# Patient Record
Sex: Male | Born: 1935 | Race: White | Hispanic: No | Smoking: Former smoker
Health system: Southern US, Community
[De-identification: ages and names within clinical notes are randomized; demographics above are authoritative.]

## PROBLEM LIST (undated history)

## (undated) DIAGNOSIS — I2510 Atherosclerotic heart disease of native coronary artery without angina pectoris: Principal | ICD-10-CM

## (undated) DIAGNOSIS — I739 Peripheral vascular disease, unspecified: Secondary | ICD-10-CM

## (undated) DIAGNOSIS — I10 Essential (primary) hypertension: Secondary | ICD-10-CM

## (undated) DIAGNOSIS — I779 Disorder of arteries and arterioles, unspecified: Secondary | ICD-10-CM

## (undated) DIAGNOSIS — R002 Palpitations: Secondary | ICD-10-CM

## (undated) DIAGNOSIS — H3530 Unspecified macular degeneration: Secondary | ICD-10-CM

## (undated) DIAGNOSIS — D649 Anemia, unspecified: Secondary | ICD-10-CM

## (undated) DIAGNOSIS — E7800 Pure hypercholesterolemia, unspecified: Secondary | ICD-10-CM

## (undated) DIAGNOSIS — N183 Chronic kidney disease, stage 3 unspecified: Secondary | ICD-10-CM

## (undated) HISTORY — DX: Peripheral vascular disease, unspecified: I73.9

## (undated) HISTORY — DX: Unspecified macular degeneration: H35.30

## (undated) HISTORY — DX: Palpitations: R00.2

## (undated) HISTORY — PX: KNEE SURGERY: SHX244

## (undated) HISTORY — DX: Disorder of arteries and arterioles, unspecified: I77.9

## (undated) HISTORY — DX: Chronic kidney disease, stage 3 unspecified: N18.30

## (undated) HISTORY — DX: Chronic kidney disease, stage 3 (moderate): N18.3

## (undated) HISTORY — PX: HERNIA REPAIR: SHX51

## (undated) HISTORY — DX: Atherosclerotic heart disease of native coronary artery without angina pectoris: I25.10

---

## 2000-10-29 DIAGNOSIS — I2510 Atherosclerotic heart disease of native coronary artery without angina pectoris: Secondary | ICD-10-CM

## 2000-10-29 HISTORY — DX: Atherosclerotic heart disease of native coronary artery without angina pectoris: I25.10

## 2001-05-02 ENCOUNTER — Ambulatory Visit (HOSPITAL_COMMUNITY): Admission: RE | Admit: 2001-05-02 | Discharge: 2001-05-02 | Payer: Self-pay | Admitting: Interventional Cardiology

## 2001-05-02 ENCOUNTER — Encounter: Payer: Self-pay | Admitting: Interventional Cardiology

## 2001-10-17 HISTORY — PX: CARDIAC CATHETERIZATION: SHX172

## 2003-03-19 ENCOUNTER — Ambulatory Visit (HOSPITAL_COMMUNITY): Admission: RE | Admit: 2003-03-19 | Discharge: 2003-03-19 | Payer: Self-pay | Admitting: *Deleted

## 2003-03-19 ENCOUNTER — Encounter: Payer: Self-pay | Admitting: *Deleted

## 2004-03-30 ENCOUNTER — Encounter: Admission: RE | Admit: 2004-03-30 | Discharge: 2004-03-30 | Payer: Self-pay | Admitting: Cardiovascular Disease

## 2004-04-03 ENCOUNTER — Ambulatory Visit (HOSPITAL_COMMUNITY): Admission: RE | Admit: 2004-04-03 | Discharge: 2004-04-03 | Payer: Self-pay | Admitting: Cardiovascular Disease

## 2004-04-03 HISTORY — PX: CEREBRAL ANGIOGRAM: SHX1326

## 2004-04-28 DIAGNOSIS — I739 Peripheral vascular disease, unspecified: Secondary | ICD-10-CM

## 2004-04-28 HISTORY — DX: Peripheral vascular disease, unspecified: I73.9

## 2004-04-28 HISTORY — PX: CAROTID ENDARTERECTOMY: SUR193

## 2004-05-02 ENCOUNTER — Inpatient Hospital Stay (HOSPITAL_COMMUNITY): Admission: RE | Admit: 2004-05-02 | Discharge: 2004-05-03 | Payer: Self-pay | Admitting: *Deleted

## 2004-05-02 ENCOUNTER — Encounter (INDEPENDENT_AMBULATORY_CARE_PROVIDER_SITE_OTHER): Payer: Self-pay | Admitting: Specialist

## 2006-11-27 ENCOUNTER — Encounter: Admission: RE | Admit: 2006-11-27 | Discharge: 2006-11-27 | Payer: Self-pay | Admitting: Family Medicine

## 2007-02-25 ENCOUNTER — Inpatient Hospital Stay (HOSPITAL_COMMUNITY): Admission: EM | Admit: 2007-02-25 | Discharge: 2007-02-28 | Payer: Self-pay | Admitting: Emergency Medicine

## 2007-09-01 ENCOUNTER — Ambulatory Visit (HOSPITAL_COMMUNITY): Admission: RE | Admit: 2007-09-01 | Discharge: 2007-09-02 | Payer: Self-pay | Admitting: Cardiology

## 2007-09-01 HISTORY — PX: OTHER SURGICAL HISTORY: SHX169

## 2008-03-31 HISTORY — PX: OTHER SURGICAL HISTORY: SHX169

## 2009-02-09 ENCOUNTER — Ambulatory Visit (HOSPITAL_BASED_OUTPATIENT_CLINIC_OR_DEPARTMENT_OTHER): Admission: RE | Admit: 2009-02-09 | Discharge: 2009-02-09 | Payer: Self-pay | Admitting: Orthopedic Surgery

## 2010-06-30 HISTORY — PX: CARDIOVASCULAR STRESS TEST: SHX262

## 2011-02-07 LAB — POCT I-STAT 4, (NA,K, GLUC, HGB,HCT)
Glucose, Bld: 101 mg/dL — ABNORMAL HIGH (ref 70–99)
HCT: 36 % — ABNORMAL LOW (ref 39.0–52.0)
Hemoglobin: 12.2 g/dL — ABNORMAL LOW (ref 13.0–17.0)
Sodium: 143 mEq/L (ref 135–145)

## 2011-02-07 LAB — GLUCOSE, CAPILLARY: Glucose-Capillary: 96 mg/dL (ref 70–99)

## 2011-02-27 ENCOUNTER — Other Ambulatory Visit: Payer: Self-pay | Admitting: Dermatology

## 2011-03-13 NOTE — Cardiovascular Report (Signed)
NAMEPRAKASH, KIMBERLING                   ACCOUNT NO.:  [id number]   MEDICAL RECORD NO.:  [id number]          PATIENT TYPE:  AMB   LOCATION:  SDS                          FACILITY:  [facility]   PHYSICIAN:  Vonna Kotyk R. Jacinto Halim, MD       DATE OF BIRTH:  [birth year]   DATE OF PROCEDURE:  DATE OF DISCHARGE:                            CARDIAC CATHETERIZATION   DATE OF PROCEDURE:  September 01, 2007.   PROCEDURES PERFORMED:  1. Abdominal aortogram  2. Selective right and left renal arteriography.   INDICATIONS:  Mr. Jamonte Curfman is a 77 year old  gentleman with COPD,  peripheral arterial disease with carotid stenosis.  He has been having  difficult to control hypertension in spite of multiple medications.  He  had undergone a renal duplex evaluation and this had revealed a 50-60%  left renal artery stenosis.  He is brought to the catheterization lab to  evaluate for renal artery stenosis.   ABDOMINAL AORTOGRAM:  Abdominal aortogram revealed presence of two renal  arteries on either side.  The left renal artery appeared to have a 40-  50% stenosis.   Selective right and left renal arteriography revealed widely patent  renal arteries without any significant stenosis.  There was no pressure  damping with 6-French catheter engagement.   IMPRESSION:  1. Widely patent renal arteries.  I suspect the hypertension is      probably essential hypertension.  Continued medical therapy is      indicated.   A total of 40 mL of contrast was utilized for diagnostic angiography.   TECHNIQUE:  The procedure under usual sterile precautions using a 6-  French right femoral arterial access.  A 6-French short LIMA catheter  was advanced in the abdominal artery and abdominal aortogram was  performed.  Then selective right and left renal arteriography was  performed.  Then the  catheters were removed in usual fashion.  The  patient tolerated the procedures well without any complications.      Cristy Hilts. Jacinto Halim, MD  Electronically Signed     JRG/MEDQ  D:  09/01/2007  T:  09/01/2007  Job:  [id number]   cc:   Darlin Priestly, MD  Donia Guiles, M.D.

## 2011-03-13 NOTE — Op Note (Signed)
[name], [name] NO.:  [id number]   MEDICAL RECORD NO.:  [id number]          PATIENT TYPE:  AMB   LOCATION:  NESC                         FACILITY:  [facility]   PHYSICIAN:  Ollen Gross, M.D.    DATE OF BIRTH:  [birth year]   DATE OF PROCEDURE:  02/09/2009  DATE OF DISCHARGE:                               OPERATIVE REPORT   PREOPERATIVE DIAGNOSIS:  Right knee medial meniscal tear.   POSTOPERATIVE DIAGNOSES:  1. Right knee medial meniscal tear.  2. Chondral defect medial femoral condyle.   PROCEDURE:  Right knee arthroscopy with medial meniscal debridement and  chondroplasty.   SURGEON:  Dr. Lequita Halt.   ASSISTANT:  None.   ANESTHESIA:  General.   ESTIMATED BLOOD LOSS:  Minimal.   DRAIN:  None.   COMPLICATIONS:  None.   CONDITION:  Stable to recovery.   BRIEF CLINICAL NOTE:  [name] is a 77 year old male with significant  right knee pain and mechanical symptoms getting progressively worse over  time.  Exam and history suggested a medial meniscal tear confirmed by  MRI.  He presents now for arthroscopy and debridement.   PROCEDURE IN DETAIL:  After the successful administration of general  anesthetic, a tourniquet was placed high on the right thigh and the  right lower extremity prepped and draped in the usual sterile fashion.  Standard superomedial and inferolateral incisions were made.  Inflow  cannula passed superomedial and the camera passed inferolateral.  Arthroscopic visualization proceeds.  The undersurface of the patella  and trochlea looked fairly normal with some low grade II chondromalacia.  The medial and lateral gutters were visualized and there was no loose  body.  Flexion and valgus force was applied to the knee and the medial  compartment was entered.  He had a bad tear in the posterior horn of the  medial meniscus which was unstable.  He also had about a 1 x 1 cm  chondral defect on the medial femoral condyle surrounded by grade  II-III  chondromalacia of about 2 x 2 cm.  A spinal needle was used to localize  the inferomedial portal.  A small incision made and dilator placed.  The  meniscus was debrided back to a stable base with baskets and a 4.2 mm  shaver and sealed off with the ArthroCare device.  It was then found to  be stable.  I debrided the unstable cartilage on the medial femoral  condyle.  That small defect about 1 x 1 cm did go down to bone.  I  debrided it down to bone and abraded it.  On the remainder of the  defect, we just trimmed away the unstable cartilage leaving a stable  cartilaginous base with stable edges.  The intercondylar notch was  visualized.  The ACL was normal.  The lateral compartment was entered  and it looked normal.  The joints were again inspected and there were no  other tears, loose bodies or defects noted.  The arthroscopic equipment  was removed from the inferior portals which were closed with interrupted  4-0  nylon.  Twenty mL of quarter-percent Marcaine with epinephrine  injected through the inflow cannula, then that was removed and that  portal closed with nylon.  A bulky sterile dressing was applied.  He was  awakened and transported to recovery in stable condition.      Ollen Gross, M.D.  Electronically Signed     FA/MEDQ  D:  02/09/2009  T:  02/09/2009  Job:  [id number]

## 2011-03-13 NOTE — Discharge Summary (Signed)
[name], [name] NO.:  [id number]   MEDICAL RECORD NO.:  [id number]          PATIENT TYPE:  OIB   LOCATION:  4710                         FACILITY:  [facility]   PHYSICIAN:  Nanetta Batty, M.D.   DATE OF BIRTH:  06-Apr-1930   DATE OF ADMISSION:  09/01/2007  DATE OF DISCHARGE:                               DISCHARGE SUMMARY   HISTORY:  [name] is a 77 year old white male patient who came into  the hospital for renal angiogram secondary to resistant hypertension on  multiple medications.  The procedure was performed by Dr. Jacinto Halim.  He had  no renal artery stenosis.  He was going to be sent home that same day;  however, his blood pressure was elevated.  He was given some IV  labetalol, and he was admitted for 23-hour observation.   On the morning of September 02, 2007 his blood pressure was 166/64; pulse  was 55; respirations were 20.  No labs were done in the hospital.   DISCHARGE MEDICINES:  His usual medicines which include:  1. Lisinopril 40 mg at bedtime.  2. Diovan 320 mg every day.  3. Clonidine 0.2 mg b.i.d.  4. Terazosin 10 mg every day.  5. Simvastatin 40 mg every day.  6. Plendil 10 mg every day.  7. Fish oil 2 tablets daily.  8. Bystolic 5 mg every day.  9. We added chlorthalidone 25 mg every day.   He will have a BMET drawn in 1 week.  He will follow up with Dr. Jenne Duran  in 2-3 weeks.   DISCHARGE DIAGNOSIS:  1. Resistant hypertension.  2. Noncritical coronary artery disease.  3. History of arteriosclerotic cardiovascular disease with a history      of carotid enterectomy.      [name], N.P.      Nanetta Batty, M.D.  Electronically Signed    BB/MEDQ  D:  09/02/2007  T:  09/03/2007  Job:  [id number]

## 2011-03-16 NOTE — Cardiovascular Report (Signed)
[name], [name]                             ACCOUNT NO.:  [id number]   MEDICAL RECORD NO.:  [id number]                   PATIENT TYPE:  OIB   LOCATION:  2857                                 FACILITY:  [facility]   PHYSICIAN:  Nanetta Batty, M.D.                DATE OF BIRTH:  1930/08/05   DATE OF PROCEDURE:  04/03/2004  DATE OF DISCHARGE:  04/03/2004                              CARDIAC CATHETERIZATION   PROCEDURE:  Cerebral angiogram.   CARDIOLOGIST:  Nanetta Batty, M.D.   INDICATIONS:  [name] is a 77 year old patient of Dr. Jenne Campus with a know  history of left ICA stenosis.  Other problems include noncritical CAD by  catheterization in 2002, hypertension, hyperlipidemia and moderate  claudication as well as dyslipidemia.  Recent cerebral MRI showed 90% left  ICA stenosis.  The patient is neurologically asymptomatic.  He presents now  for diagnostic cerebral angiography to define his anatomy.  Of note is the  fact that he did undergo cerebral angiography by Dr. Darrold Span in May  2004 revealing 70% left ICA stenosis.   DESCRIPTION OF PROCEDURE:  The patient was brought to the 6th floor [facility] in the post absorptive state.  He was premedicated with p.o. Valium.  His right groin was prepped and  shaved in the usual sterile fashion.  1% Xylocaine was used for local  anesthesia.  A 5 French sheath was inserted into the right femoral artery  using standard Seldinger technique.  A 5 [place] tennis racket catheter was  used for arch angiography and distal abdominal aortography.  A JV-1 catheter  was used for selective right carotid angiography both intracranial and  extracranial, selective left carotid angiography both intracranial and  extracranial and selective left vertebral angiography both intracranial and  extracranial.  Visipaque dye was used for the entirety of the case.  Retrograde aortic pressures were monitored during the  case.  The patient  left the lab in stable condition.   ANGIOGRAPHIC RESULTS:  1. Arch aortogram:  The patient had a type I arch.  2. Right carotid artery:  There was a 30% ostial right ICA stenosis.     Intracranial and extracranial carotid circulation was normal.  The right     vertebral was not assessed because of it showing to be angiographically     absent through his last procedure.  3. Left carotid:  80% ostial left ICA stenosis with intact intracranial and     extracranial circulation.  4. Left vertebral:  Selectively engaged with normal intracranial and     extracranial circulation.  5. Distal femoral aortography:  Performed using 20 mL of Visipaque dye at 20     mL per second.  The renal arteries were widely patent.  Infrarenal     abdominal aorta was free of atherosclerotic sclerotic changes.  There was  at least 30% segmental proximal bilateral iliac stenosis.   IMPRESSION:  [name] has high grade 80% ostial left ICA stenosis and is  neurologically asymptomatic.   PLAN:  The plan will be to discharge him home today as an outpatient.  He  will be treated with aspirin and Plavix.  He is neurologically asymptomatic.  He will be seen back in the office in followup in approximately two weeks.  Dr. Higinio Plan proctored during the case.                                               Nanetta Batty, M.D.    [name]  D:  04/03/2004  T:  04/03/2004  Job:  [id number]   cc:   Peripheral Angiographic Suite  6th floor [facility]   [facility]   Darlin Priestly, M.D.  [address], [address]  [place]  [place] [address]  Fax: [phone]   Donia Guiles, M.D.  [address] [place]  [place] [address]  Fax: [phone]

## 2011-03-16 NOTE — Op Note (Signed)
[name], [name]                             ACCOUNT NO.:  [id number]   MEDICAL RECORD NO.:  [id number]                   PATIENT TYPE:  INP   LOCATION:  3312                                 FACILITY:  [facility]   PHYSICIAN:  Balinda Quails, M.D.                 DATE OF BIRTH:  1930-10-10   DATE OF PROCEDURE:  05/02/2004  DATE OF DISCHARGE:                                 OPERATIVE REPORT   No dictation for this job.                                               Balinda Quails, M.D.    PGH/MEDQ  D:  05/02/2004  T:  05/03/2004  Job:  [id number]

## 2011-03-16 NOTE — Discharge Summary (Signed)
[name], [name] NO.:  [id number]   MEDICAL RECORD NO.:  [id number]          PATIENT TYPE:  INP   LOCATION:  3731                         FACILITY:  [facility]   PHYSICIAN:  Darlin Priestly, MD  DATE OF BIRTH:  [birth year]   DATE OF ADMISSION:  02/25/2007  DATE OF DISCHARGE:  02/28/2007                               DISCHARGE SUMMARY   DISCHARGE DIAGNOSES:  1. Labile hypertension.  2. Cardiovascular disease with involvement of the carotid arteries.  3. Status post left carotid endarterectomy.  4. Newly diagnosed diabetes mellitus.  5. Status post recent episode of impaired eye vision.   HISTORY OF PRESENT ILLNESS/HOSPITAL COURSE:  This is a 77 year old  patient of Dr. Jenne Duran who presented to the emergency room with  fluctuating blood pressure.  A few days prior to that, the patient  experienced impaired vision with his left eye, which persisted for a  number of days and then spontaneously it resolved.  He was seen for this  problem by his Dr. Jana Duran who followed his macular degeneration and  it was felt to be most related to the eye condition but rather vascular  in etiology.  The patient was seen by Dr. Jenne Duran who referred him to  MRI and MRA of his brain, which showed no acute abnormality in blood  flow or in white matter.  The patient noticed his blood pressure at home  during the last few days was really labile, it was fluctuating from as  low as a systolic 120s to as high as systolic 197 and this was all  happening within a period of a couple of hours.  At times his blood  pressure was dropping into the 90s and the patient was feeling very  tired and sleepy on those occasions.   We admitted him to [facility] Telemetry Unit and had him on a  monitor.  Telemetry did not reveal any significant arrhythmia.  All of  his laboratories were within normal limits.  His hemoglobin A1c was  elevated 6 to 7 and we requested a cardiology consult with  a  nutritionist.  He was instructed regarding carbohydrate counting.  The  case was discussed with a neurologist and Dr. Thad Duran recommended the  patient followup with a neurologist as an outpatient to rule out  possible TIA.  On Feb 28, 2007, he was assessed by Dr. Alanda Duran and felt  to be stable for discharge home.   DISCHARGE MEDICATIONS:  1. Aggrenox 1 capsule b.i.d.  2. Simvastatin 40 mg daily.  3. Vasotec 20 mg b.i.d.  4. Diovan 80 mg once a day.  5. Clonidine 0.1 mg twice a day.  6. Plendil 10 mg daily.  7. Terazosin 5 mg daily.   LABORATORY DATA:  His laboratories revealed a CBC on day of discharge  was the following:  White blood cell count 5.2, hemoglobin 11.1,  hematocrit 32.5, platelet count 197.  Sodium 139, potassium 4.4,  chloride 107, CO2 27, glucose 135, BUN 31, creatinine 1.9.   DISCHARGE FOLLOWUP:  Ultrasound of the arteries scheduled on  May 19,  200, at 11:30.  Followup appointment with Dr. Jenne Duran scheduled on Mar 19, 2007, at 3:15 and another appointment scheduled with Dr. Thad Duran at  [facility] on Mar 21, 2007, at 9:00 a.m.      [name], P.A.      Darlin Priestly, MD  Electronically Signed    MK/MEDQ  D:  02/28/2007  T:  03/01/2007  Job:  [id number]   cc:   Donia Guiles, M.D.  Michael L. [name], M.D.  Darlin Priestly, MD

## 2011-03-16 NOTE — Discharge Summary (Signed)
NAMEBRECK, MARYLAND                             ACCOUNT NO.:  [id number]   MEDICAL RECORD NO.:  [id number]                   PATIENT TYPE:  INP   LOCATION:  3312                                 FACILITY:  [facility]   PHYSICIAN:  Balinda Quails, M.D.                 DATE OF BIRTH:  1930-06-24   DATE OF ADMISSION:  05/02/2004  DATE OF DISCHARGE:  05/03/2004                                 DISCHARGE SUMMARY   ADMISSION DIAGNOSIS:  Severe left internal carotid artery stenosis.   ADDITIONAL DIAGNOSES/DISCHARGE DIAGNOSES:  1. Severe left internal carotid artery stenosis, status post left carotid     endarterectomy with Dacron patch angioplasty, completed on May 02, 2004.  2. Hypertension.  3. Hyperlipidemia.  4. History of peripheral vascular disease.  5. Chronic obstructive pulmonary disease.  6. History of psoriasis.  7. History of moderate coronary artery disease.   HOSPITAL MANAGEMENT/PROCEDURES:  Left carotid endarterectomy with Dacron  patch angioplasty completed by Dr. Madilyn Fireman of [facility] on May 02, 2004.   CONSULTATIONS:  None.   HISTORY OF PRESENT ILLNESS:  Mr. Amsler is a pleasant 76 year old male who  was referred to Dr. Madilyn Fireman for evaluation of severe left internal carotid  artery stenosis.  The patient at the time of presentation denied any  neurologic symptoms.  He denied any sensory, motor, or visual deficit.  A  recent evaluation at Dr. Clayborne Dana office revealed evidence of a left  carotid bruit.  Further evaluation with Doppler studies revealed a high-  grade left internal carotid artery stenosis estimated at 80-85%.  The  patient was then seen and examined in consultation by Dr. Madilyn Fireman on April 24, 2004.  His impression was that, indeed, the patient had asymptomatic severe  left internal carotid artery stenosis that was verified by a catheter-based  arteriography.  He recommended left carotid endarterectomy for reduction of  stroke risk.  The risks, benefits, and  alternatives to the procedure were  discussed with the patient and his family at that time.  He was in  understanding and agreed to proceeding with surgery.  The patient was  instructed to discontinue his Plavix and increase his aspirin to 325 mg  daily in anticipation of surgery.   HOSPITAL COURSE:  Mr. Trulson was electively admitted to [facility]  on May 02, 2004.  He was taken to the operating room and underwent a left  carotid endarterectomy with Dacron patch angioplasty, completed by Dr. Madilyn Fireman  of [facility].  Overall, the patient tolerated this procedure well and was  extubated on the operating room table.  The patient was then transferred to  the postanesthesia care unit in stable condition.  Once awake, alert, and  appropriate, the patient was then transferred to 3300.   The patient awoke and recovered from anesthesia without any neurologic  deficits.  He remained hemodynamically stable and afebrile throughout  the  remainder of his operative stay.   On postop day #1, the patient was doing quite well.  He had already  ambulated independently in the hallways in the morning.  He had tolerated a  clear liquid diet on the night prior.  He had denied any chest pain,  shortness of breath, or nausea and vomiting.  The patient had remained  afebrile.  His blood pressure was stable in the  150s160s/40s-50s.  His  heart rate was in the 70s, reading normal sinus rhythm on telemetry.  His  respirations were 20 and unlabored.  His SPO2 was 98% on room air.  The  patient had excellent urine output with 2175 mL out overnight.   His physical exam at the time was as follows:   CARDIAC:  His heart was in a regular rate and rhythm.  CHEST:  His lungs were clear to auscultation.  ABDOMEN:  Soft, nontender, nondistended, with good bowel sounds.  EXTREMITIES:  Without edema.  He had 2+ posterior tibial pulses bilaterally.  NECK:  His left neck incision was clean, dry, and intact without  erythema,  drainage, or swelling.  NEUROLOGIC:  He was neurologically intact without any focal deficits.   The patient's lab values were all within normal limits.  The patient had  resumed normal bowel and bladder function.  Overall, he was deemed  appropriate for discharge then that date.   DISCHARGE MEDICATIONS:  1. Advair 100/50 mcg two puffs daily.  2. Aspirin 81 mg daily.  3. Clonidine 0.1 mg daily.  4. Diovan 80 mg daily.  5. Enalapril 20 mg daily.  6. Hydrochlorothiazide 25 mg daily.  7. Lescol 80 mg daily.  8. Norvasc 10 mg daily.  9. Plavix 75 mg daily.  10.      Terazosin 5 mg daily.  11.      Zetia 10 mg daily.  12.      Tylox one to two tablets every four to six hours as needed for     pain.   DISCHARGE INSTRUCTIONS:  1. Activity:  The patient should avoid driving.  He should avoid heavy     lifting or strenuous activity.  He should continue to walk daily.  The     patient should continue his breathing exercises for one more week.  2. Diet:  The patient should follow a low-cholesterol, low-fat, heart-     healthy diet.  3. Wound care:  The patient may shower starting May 04, 2004.  He should     wash his incisions daily with soap and water.  He should notify the [facility]     office if he has any drainage from his incision or any swelling or     redness.   FOLLOW-UP APPOINTMENTS:  The patient is to see Dr. Madilyn Fireman on Monday, August  1, at 9:30 a.m.      Carolyn A. Arlean Hopping, M.D.    CAF/MEDQ  D:  05/16/2004  T:  05/17/2004  Job:  [id number]   cc:   Nanetta Batty, M.D.  Fax: [phone]   Donia Guiles, M.D.  [address] [place]  [place] [address]  Fax: [phone]

## 2011-03-16 NOTE — Op Note (Signed)
[name], [name]                             ACCOUNT NO.:  [id number]   MEDICAL RECORD NO.:  [id number]                   PATIENT TYPE:  INP   LOCATION:  3312                                 FACILITY:  [facility]   PHYSICIAN:  Balinda Quails, M.D.                 DATE OF BIRTH:  1930/08/29   DATE OF PROCEDURE:  05/12/2004  DATE OF DISCHARGE:                                 OPERATIVE REPORT   SURGEON:  Balinda Quails, M.D.   ASSISTANTS:  Di Kindle. Edilia Bo, M.D., Carmin Muskrat. Eustaquio Boyden.   ANESTHESIA:  General endotracheal.   PREOPERATIVE DIAGNOSIS:  Severe left internal carotid artery stenosis.   POSTOPERATIVE DIAGNOSIS:  Severe left internal carotid artery stenosis.   PROCEDURE:  Left carotid endarterectomy with Dacron patch angioplasty.   CLINICAL NOTE:  [name] is a 77 year old male with a left carotid bruit.  He underwent Doppler evaluation followed by carotid arteriography revealing  severe left internal carotid artery stenosis.  He was referred for an  opinion regarding left carotid endarterectomy.  The patient was seen and  evaluated and recommendation was made for left carotid endarterectomy.  The  patient was consented for surgery.  The risks and benefits of the operative  procedure were explained to the patient in detail, the usual morbidity and  mortality associated with this procedure, 1-2% to include but not limited to  MI, CVA, and death.   OPERATION PROCEDURE:  Patient was brought to the operating room in stable  condition.  Placed in the supine position.  General endotracheal anesthesia  induced.  Left neck was prepped and draped in a sterile fashion.  Foley  catheter and arterial line in place.  Curvilinear skin incision was made  along the anterior border of the left sternal mastoid muscle.  Dissection  was carried down to the subcutaneous tissue with microcautery.  Deep  dissection was carried down through the platysma.  Dissection then continued  down  along the anterior border of the sternomastoid muscle to the carotid  sheath.  The facial vein was ligated with 2-0 silk and divided.  Carotid  bifurcation was exposed.  The common carotid artery was mobilized down to  the hyoid muscle and circled with a vessel loop.  Next the parathyroid and  external carotid were mobilized in a circle of vessel loops.  The internal  carotid artery was followed distally up to the posterior belly of the  gastroc muscle.  The hypoglossal nerve was reflected superiorly and  preserved.  The distal internal carotid artery was encircled with a vessel  loop.   Evaluation of carotid bifurcation revealed extensive plaque disease at the  bifurcation extending 2-3 cm into the left internal carotid artery.  The  patient was administered 7000 units of heparin intravenously.  Adequate  circulation time permitted.  The carotid vessels were controlled with  clamps.  A longitudinal  arteriotomy was made in the distal common carotid  artery.  The arteriotomy was extended across the carotid bulb and up into  the internal carotid artery.  There was ulcerated plaque at the origin of  the left internal carotid artery with a high-grade stenosis.   The shunt was inserted.  The endarterectomy elevator was then used to remove  the plaque. The endarterectomy carried down into the common carotid artery.  The plaque was divided transversely with Pott's scissors.  Plaque then  raised up into the bulb was __________ next normal carotid endarterectomy  using an eversion technique.  Distal plaque from the internal carotid artery  was raised up and terminated.  Fragments of plaque were removed with plaque  forceps.  The site was irrigated with heparin saline solution.  A Dacron  patch was then placed over the endarterectomy site using running 6-0 Prolene  suture.  At the completion of patch angioplasty the shunt was removed.  All  vessels flushed.  The clamps removed, directing initial  antegrade flow up  the external carotid artery.  Following this the internal carotid was  released.   Adequate Doppler signal and excellent pulse were present in the left  internal carotid artery.  Adequate hemostasis obtained.  Sponge and  instrument counts were correct.  The patient was given 50 mg of Protamine  intravenously.   The sternomastoid fascia was closed with a running 2-0 Vicryl suture.  The  platysma was closed with a running 2-0 Vicryl suture.  The skin was closed  with 4-0 Monocryl.  The Steri-Strips were applied.   Anesthesia was reversed in the operating room.  The patient awakened readily  and moved all extremities to command.  Transferred to the recovery room in  stable condition.                                               Balinda Quails, M.D.    PGH/MEDQ  D:  05/02/2004  T:  05/03/2004  Job:  [id number]   cc:   Nanetta Batty, M.D.  Fax: [phone]   Donia Guiles, M.D.  [address] [place]  [place] [address]  Fax: [phone]

## 2011-03-16 NOTE — H&P (Signed)
[name], BLIXT NO.:  [id number]   MEDICAL RECORD NO.:  [id number]          PATIENT TYPE:  INP   LOCATION:  1846                         FACILITY:  [facility]   PHYSICIAN:  Raymon Mutton, P.A. DATE OF BIRTH:  [birth year]   DATE OF ADMISSION:  02/25/2007  DATE OF DISCHARGE:                              HISTORY & PHYSICAL   CHIEF COMPLAINT:  Fluctuating blood pressure.   This 77 year old gentleman, a patient of Dr. Jenne Campus, presented to the  emergency room __________ .  On April 19, he woke up at 3:00 a.m. to go  to the bathroom and all of a sudden realized that he did not have a full  field of vision in the left eye.  He could see only in the periphery and  the central vision was absent.  He was really scared knowing that he has  a macular degeneration.  During the day the symptoms improved and the  patient was able to see better with the bright light.  The next morning  the same symptoms returned.  The patient went to see his ophthalmologist  in [facility] as Dr. Sharyl Nimrod, who follows him for his macular  degeneration.  He was told that everything is stable from the  ophthalmology standpoint and the problem is most likely related to  vascular etiology.  Given his previous history of carotid disease and  left carotid endarterectomy, the patient presented to our office to see  Dr. Jenne Campus.  Dr. Jenne Campus sent him immediately to the [facility] for  MRI and MRA of the head and started him on Aggrenox.   The patient had MRI and MRA on February 20, 2007.  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  MRA revealed __________  origin  of the right posterior cerebral artery which was a developmental  variant.  Otherwise negative screening intracranial MRA examination.   His symptoms persisted through April 25  when all of a sudden the patient  woke up in the morning with severe pain in his left eye like something  was torn inside and finally was able to see clearly.  During the office  visit Dr. Jenne Campus advised patient to follow up his blood pressure and  today he called to [place], saying that his blood pressure fluctuated at the  extremes.  In the morning it was as low as 98/44, then a couple of hours  later it was 175/72, and later even 213/68.  The patient was advised to  come to the emergency room for evaluation.   PAST MEDICAL HISTORY:  Significant for noncritical coronary artery  disease.  Last stress test in 2007 showed no ischemia.  2D echo in 2007  showed hyperdynamic LV, probably relaxation abnormality and no  significant valvular disease.   He also has a history of COPD, psoriasis, hypertension and  hyperlipidemia.   FAMILY HISTORY:  Noncontributory.   SOCIAL HISTORY:  The patient is widowed, lives alone, has  two children,  four grandchildren and two great grandchildren.  He is a nonsmoker, does  not drink alcohol.  Plays golf at least every other day.  Does not use  any illicit drugs.   REVIEW OF SYSTEMS:  Currently he denies any symptoms.  His vision is  restored.  He does not have any chest pain or shortness of breath,  dizziness or lightheadedness, but what was concerning was a couple of  days ago he felt really weak and sleepy, had a low energy, but today he  said symptoms were the same when his blood pressure was dropping.   MEDICATIONS:  Clonidine 0.2 mg b.i.d.  Diovan 80 mg p.o. once a day.  Enalapril 20 mg once daily.  Baby aspirin 81 mg once daily.  HCTZ 25 mg once daily.  Terazosin 5 mg once daily.  Simvastatin 40 mg once daily.  Plendil 40 mg once daily.  Aggrenox 1 p.o. daily.  Omega-3 fatty acid daily.   PHYSICAL EXAMINATION:  Blood pressure is fluctuating between 174/75 to  194/68 to 213/76 and then 180/64.  His heart rate is 62, respirations  20,  temperature 7.9, and SATs 100% on room air.   HEENT:  Normocephalic, atraumatic.  Extraocular movements intact.  Neck:  No JVD.  Left sided carotid bruits.  Lungs clear to auscultation and  percussion bilaterally.  Heart:  Regular rate and rhythm.  There are no  murmurs, gallops, rubs.  Abdomen soft, nontender, nondistended with  positive bowel sounds x4 and no organomegaly or masses appreciated.  Lower extremities without edema.   His EKG showed normal sinus rhythm.  No acute ST-T wave changes.  Laboratory showed hemoglobin 11.6, hematocrit 34.9, white blood cell  count 5.8, platelets 201.  Sodium 142, potassium 4.1, chloride 110, CO2  29, BUN 26, creatinine 113, glucose 155.  Cardiac markers first set was  negative.   IMPRESSION:  1. Poorly controlled hypertension.  2. PVD.  3. Hyperlipidemia.  4. Non-critical coronary artery disease.  5. Recent amaurosis fugax with normal brain MRI and MRA studies.   PLAN:  We are going to admit patient for observation and blood pressure  control.  Check 24-hour urine catecholamines and 24-hour metanephrine's.  We will also adjust his meds.  The physician will see patient and advise  on further plan and treatment.      Raymon Mutton, P.A.     MK/MEDQ  D:  02/25/2007  T:  02/25/2007  Job:  [id number]   cc:   [facility]

## 2011-03-27 ENCOUNTER — Other Ambulatory Visit: Payer: Self-pay | Admitting: Dermatology

## 2011-04-09 ENCOUNTER — Other Ambulatory Visit: Payer: Self-pay | Admitting: Dermatology

## 2011-05-03 ENCOUNTER — Other Ambulatory Visit: Payer: Self-pay | Admitting: Cardiology

## 2011-05-03 ENCOUNTER — Ambulatory Visit
Admission: RE | Admit: 2011-05-03 | Discharge: 2011-05-03 | Disposition: A | Discharge status: Home / Self Care | Payer: 59 | Source: Ambulatory Visit | Attending: Cardiology | Admitting: Cardiology

## 2011-05-03 DIAGNOSIS — Z01811 Encounter for preprocedural respiratory examination: Secondary | ICD-10-CM

## 2011-05-07 ENCOUNTER — Ambulatory Visit (HOSPITAL_COMMUNITY)
Admission: RE | Admit: 2011-05-07 | Discharge: 2011-05-07 | Disposition: A | Discharge status: Home / Self Care | Payer: 59 | Source: Ambulatory Visit | Attending: Cardiovascular Disease | Admitting: Cardiovascular Disease

## 2011-05-07 DIAGNOSIS — I2510 Atherosclerotic heart disease of native coronary artery without angina pectoris: Secondary | ICD-10-CM | POA: Insufficient documentation

## 2011-05-07 DIAGNOSIS — I6529 Occlusion and stenosis of unspecified carotid artery: Secondary | ICD-10-CM | POA: Insufficient documentation

## 2011-05-07 DIAGNOSIS — E663 Overweight: Secondary | ICD-10-CM | POA: Insufficient documentation

## 2011-05-07 DIAGNOSIS — I10 Essential (primary) hypertension: Secondary | ICD-10-CM | POA: Insufficient documentation

## 2011-05-07 DIAGNOSIS — Z87891 Personal history of nicotine dependence: Secondary | ICD-10-CM | POA: Insufficient documentation

## 2011-05-07 DIAGNOSIS — E119 Type 2 diabetes mellitus without complications: Secondary | ICD-10-CM | POA: Insufficient documentation

## 2011-05-07 HISTORY — PX: OTHER SURGICAL HISTORY: SHX169

## 2011-05-07 LAB — GLUCOSE, CAPILLARY
Glucose-Capillary: 151 mg/dL — ABNORMAL HIGH (ref 70–99)
Glucose-Capillary: 105 mg/dL — ABNORMAL HIGH (ref 70–99)
Glucose-Capillary: 196 mg/dL — ABNORMAL HIGH (ref 70–99)

## 2011-05-09 NOTE — Consult Note (Signed)
  NAMEAVINASH, MALTOS                   ACCOUNT NO.:  [id number]  MEDICAL RECORD NO.:  [id number]  LOCATION:  SDSC                         FACILITY:  [facility]  PHYSICIAN:  Willard Madrigal K. Braxton Vantrease, M.D.DATE OF BIRTH:  25-Apr-1930  DATE OF CONSULTATION: DATE OF DISCHARGE:  05/07/2011                                CONSULTATION   BRIEF HISTORY:  The patient has  progressive stenoses of the right internal carotid artery.    Examination is bilateral carotid arteriograms with intracranial interpretation.  FINDINGS:   The right common carotid arteriogram demonstrates the right internal carotid artery at the cervico-petrous junction to be widely patent.  The petrous, cavernous, and supraclinoid segments are normal.  The right middle and right anterior cerebral arteries  are seen to opacify normally into capillary and  venous phases.  Cross specification via the anterior communicating artery of  the left anterior cerebral artery A2 segment is seen. Also seen is a dominant right posterior communicating artery opacifying the right posterior cerebral and superior cerebellar  artery distributions.  The left common carotid arteriogram demonstrates the left internal carotid artery at the cervico-petrous junction to be normal.  The petrous, cavernous, and supraclinoid segments are normally opacified.  The left middle and left anterior cerebral arteries  opacify normally into the capillary and   venous phases.  Opacification of the anterior communicating artery and transiently of the right anterior cerebral artery,  A2 segment is seen.  IMPRESSION:  No angiographic  evidence of intracranial occlusions, stenosis, dissections, or aneurysms seen on this study.         ______________________________ Grandville Silos Corliss Skains, M.D.     SKD/MEDQ  D:  05/08/2011  T:  05/09/2011  Job:  [id number]  Electronically Signed by Julieanne Cotton M.D. on 05/09/2011 11:14:44 AM

## 2011-05-23 NOTE — Procedures (Signed)
  NAMEROMMIE, DUNN NO.:  [id number]  MEDICAL RECORD NO.:  [id number]  LOCATION:  SDSC                         FACILITY:  [facility]  PHYSICIAN:  Nanetta Batty, M.D.   DATE OF BIRTH:  Apr 02, 1930  DATE OF PROCEDURE: DATE OF DISCHARGE:                   PERIPHERAL VASCULAR INVASIVE PROCEDURE   Mr. Broad is a very pleasant 77 year old mildly overweight widowed Caucasian male whose wife of 52 years died back in 03-17-05.  He is a father of 2 and grandfather of 2 grandchildren.  His risk factors include remote tobacco abuse, diabetes and hypertension.  He does not have heart attack or stroke.  His cath back in December 2002 revealing moderate but not significant CAD.  He had left carotid endarterectomy performed by Dr. Liliane Bade May 12, 2004, asked to follow in our office with duplex ultrasound.  Myoview performed on June 30, 2010, was nonischemic. Because of progression of disease on his right carotid, presents now for angiography to confirm severity and determine whether or not he needs revascularization.  PROCEDURE DESCRIPTION:  The patient was brought to the [facility] in the postabsorptive state.  He was not premedicated.  His right groin was prepped and shaved in usual sterile fashion.  Xylocaine 1% was used for local anesthesia.  A 5- French sheath was inserted to the right femoral artery using standard Seldinger technique.  A 5-French pigtail catheter and JD-1 catheters were used for aortic arch angiography and only a few selective right and left carotid artery angiography.  Visipaque dye was used for entirety of the case.  Retrograde aortic pressures monitored during the case.  ANGIOGRAPHIC RESULTS: 1. Artery aortogram; type 2.5 arch. 2. Right carotid; 60% calcified ostial right internal carotid artery     stenosis.  There was fluoroscopic calcium in the bulb as well. 3. Right carotid; normal.  Neuro Interventional  Radiology will read the intracranial portion of this angiogram  IMPRESSION:  Mr. Mansfield has moderate right internal carotid artery stenosis.  Does not appear to be significantly hemodynamically significant to require revascularization at this time, but rather we will continue to follow him medically with serial Dopplers.  The sheath was removed and pressures held on the groin to achieve hemostasis.  The patient left lab in stable condition.  He will be discharged home later today as an outpatient after being gently hydrated.  He will see me back in the office in 1 or 2 weeks for followup.     Nanetta Batty, M.D.     JB/MEDQ  D:  05/07/2011  T:  05/08/2011  Job:  [id number]  cc:   [facility] Lupita Raider, M.D.  Electronically Signed by Nanetta Batty M.D. on 05/23/2011 03:19:11 PM

## 2011-05-28 ENCOUNTER — Other Ambulatory Visit: Payer: Self-pay | Admitting: Dermatology

## 2012-02-13 ENCOUNTER — Other Ambulatory Visit: Payer: Self-pay | Admitting: Gastroenterology

## 2012-02-22 ENCOUNTER — Encounter (HOSPITAL_COMMUNITY): Payer: Self-pay | Admitting: Emergency Medicine

## 2012-02-22 ENCOUNTER — Emergency Department (HOSPITAL_COMMUNITY): Payer: No Typology Code available for payment source

## 2012-02-22 ENCOUNTER — Emergency Department (HOSPITAL_COMMUNITY)
Admission: EM | Admit: 2012-02-22 | Discharge: 2012-02-22 | Disposition: A | Discharge status: Home / Self Care | Payer: No Typology Code available for payment source | Attending: Emergency Medicine | Admitting: Emergency Medicine

## 2012-02-22 DIAGNOSIS — Z79899 Other long term (current) drug therapy: Secondary | ICD-10-CM | POA: Insufficient documentation

## 2012-02-22 DIAGNOSIS — E162 Hypoglycemia, unspecified: Secondary | ICD-10-CM | POA: Insufficient documentation

## 2012-02-22 DIAGNOSIS — I10 Essential (primary) hypertension: Secondary | ICD-10-CM | POA: Insufficient documentation

## 2012-02-22 DIAGNOSIS — Z7982 Long term (current) use of aspirin: Secondary | ICD-10-CM | POA: Insufficient documentation

## 2012-02-22 DIAGNOSIS — E7800 Pure hypercholesterolemia, unspecified: Secondary | ICD-10-CM | POA: Insufficient documentation

## 2012-02-22 DIAGNOSIS — R404 Transient alteration of awareness: Secondary | ICD-10-CM | POA: Insufficient documentation

## 2012-02-22 DIAGNOSIS — E119 Type 2 diabetes mellitus without complications: Secondary | ICD-10-CM | POA: Insufficient documentation

## 2012-02-22 DIAGNOSIS — R55 Syncope and collapse: Secondary | ICD-10-CM | POA: Insufficient documentation

## 2012-02-22 HISTORY — DX: Pure hypercholesterolemia, unspecified: E78.00

## 2012-02-22 HISTORY — DX: Essential (primary) hypertension: I10

## 2012-02-22 LAB — BASIC METABOLIC PANEL
Chloride: 105 mEq/L (ref 96–112)
Potassium: 4 mEq/L (ref 3.5–5.1)
Sodium: 141 mEq/L (ref 135–145)

## 2012-02-22 LAB — CBC
Hemoglobin: 10.3 g/dL — ABNORMAL LOW (ref 13.0–17.0)
Platelets: 179 10*3/uL (ref 150–400)
RBC: 3.52 MIL/uL — ABNORMAL LOW (ref 4.22–5.81)

## 2012-02-22 LAB — GLUCOSE, CAPILLARY: Glucose-Capillary: 193 mg/dL — ABNORMAL HIGH (ref 70–99)
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
## 2012-02-26 ENCOUNTER — Ambulatory Visit
Admission: RE | Admit: 2012-02-26 | Discharge: 2012-02-26 | Disposition: A | Discharge status: Home / Self Care | Payer: 59 | Source: Ambulatory Visit | Attending: Family Medicine | Admitting: Family Medicine

## 2012-02-26 ENCOUNTER — Other Ambulatory Visit: Payer: Self-pay | Admitting: Family Medicine

## 2012-02-26 DIAGNOSIS — S99929A Unspecified injury of unspecified foot, initial encounter: Secondary | ICD-10-CM

## 2012-06-09 HISTORY — PX: OTHER SURGICAL HISTORY: SHX169

## 2013-04-21 ENCOUNTER — Encounter: Payer: Self-pay | Admitting: Cardiovascular Disease

## 2013-04-29 ENCOUNTER — Other Ambulatory Visit: Payer: Self-pay | Admitting: Cardiovascular Disease

## 2013-04-29 NOTE — Telephone Encounter (Signed)
Sent refill

## 2013-05-13 ENCOUNTER — Other Ambulatory Visit: Payer: Self-pay

## 2013-05-13 MED ORDER — PINDOLOL 10 MG PO TABS: 10.0000 mg | ORAL_TABLET | Freq: Every morning | ORAL | Status: DC

## 2013-05-13 MED ORDER — TERAZOSIN HCL 10 MG PO CAPS: 10.0000 mg | ORAL_CAPSULE | Freq: Every day | ORAL | Status: DC

## 2013-05-13 NOTE — Telephone Encounter (Signed)
Rx was sent to pharmacy electronically. 

## 2013-06-17 ENCOUNTER — Ambulatory Visit (INDEPENDENT_AMBULATORY_CARE_PROVIDER_SITE_OTHER): Payer: 59 | Admitting: Cardiology

## 2013-06-17 ENCOUNTER — Encounter: Payer: Self-pay | Admitting: Cardiology

## 2013-06-17 VITALS — BP 122/52 | HR 50 | Ht 66.0 in | Wt 179.4 lb

## 2013-06-17 DIAGNOSIS — I739 Peripheral vascular disease, unspecified: Secondary | ICD-10-CM | POA: Insufficient documentation

## 2013-06-17 DIAGNOSIS — E7800 Pure hypercholesterolemia, unspecified: Secondary | ICD-10-CM

## 2013-06-17 DIAGNOSIS — I10 Essential (primary) hypertension: Secondary | ICD-10-CM

## 2013-06-17 DIAGNOSIS — N183 Chronic kidney disease, stage 3 unspecified: Secondary | ICD-10-CM

## 2013-06-17 DIAGNOSIS — R5383 Other fatigue: Secondary | ICD-10-CM

## 2013-06-17 DIAGNOSIS — N184 Chronic kidney disease, stage 4 (severe): Secondary | ICD-10-CM | POA: Insufficient documentation

## 2013-06-17 DIAGNOSIS — I2510 Atherosclerotic heart disease of native coronary artery without angina pectoris: Secondary | ICD-10-CM

## 2013-06-17 DIAGNOSIS — I160 Hypertensive urgency: Secondary | ICD-10-CM | POA: Insufficient documentation

## 2013-06-17 DIAGNOSIS — I498 Other specified cardiac arrhythmias: Secondary | ICD-10-CM

## 2013-06-17 DIAGNOSIS — R001 Bradycardia, unspecified: Secondary | ICD-10-CM

## 2013-06-17 DIAGNOSIS — R5381 Other malaise: Secondary | ICD-10-CM

## 2013-06-17 MED ORDER — PINDOLOL 10 MG PO TABS: 5.0000 mg | ORAL_TABLET | Freq: Every morning | ORAL | Status: DC

## 2013-06-17 NOTE — Assessment & Plan Note (Signed)
Possibly associated with bradycardia

## 2013-06-17 NOTE — Patient Instructions (Addendum)
Decrease Pindolol to 5 mg daily.  Your physician has requested that you have a carotid duplex. This test is an ultrasound of the carotid arteries in your neck. It looks at blood flow through these arteries that supply the brain with blood. Allow one hour for this exam. There are no restrictions or special instructions. Your physician recommends that you schedule a follow-up appointment in: after dopplers

## 2013-06-17 NOTE — Assessment & Plan Note (Addendum)
Controlled, LVH by EKG, diastolic dysfunction  By echo in 2009

## 2013-06-17 NOTE — Assessment & Plan Note (Addendum)
Dr Shaw follows 

## 2013-06-17 NOTE — Assessment & Plan Note (Signed)
No chest pain

## 2013-06-17 NOTE — Progress Notes (Signed)
06/17/2013 [name]   Dec 21, 1929  [id number]  Primary Physicia SHAW,[name], MD Primary Cardiologist: Dr Allyson Sabal  HPI:  The patient is an 79 year old, mildly overweight, widowed Caucasian male. He is a father of 2 and grandfather to 2 grandchildren. He had noncritical CAD by cath performed by Dr. Lenise Herald December 2002, and had a low risk Myoview 9/11. He has known carotid disease status post left carotid endarterectomy performed by Dr. Liliane Bade May 12, 2004. We have been following his carotids by duplex ultrasound. Dr Allyson Sabal angiogram'd him May 07, 2011, revealing a 50%-60% ostial right internal carotid artery stenosis and his dopplers have remained stable. He is neurologically asymptomatic.He is here today for a 6 month check up. He denies any chest pain. He does admit to fatigue. He denies any syncope or pre syncope. His HR in the office is 50.    Current Outpatient Prescriptions  Medication Sig Dispense Refill  . allopurinol (ZYLOPRIM) 100 MG tablet Take 100 mg by mouth every evening.      Marland Kitchen amLODipine (NORVASC) 10 MG tablet Take 1 tablet by mouth daily.      Marland Kitchen aspirin EC 81 MG tablet Take 81 mg by mouth every evening.      . calcitRIOL (ROCALTROL) 0.25 MCG capsule       . cholecalciferol (VITAMIN D) 1000 UNITS tablet Take 1,000 Units by mouth daily.      . clopidogrel (PLAVIX) 75 MG tablet Take 75 mg by mouth every morning.      . fish oil-omega-3 fatty acids 1000 MG capsule Take 1 g by mouth 2 (two) times daily.       . furosemide (LASIX) 80 MG tablet Take 1 tablet by mouth daily.      . IRON PO Take 1 tablet by mouth daily.      . Multiple Vitamins-Minerals (VISION-VITE PRESERVE PO) Take 1 tablet by mouth 2 (two) times daily.      . pindolol (VISKEN) 10 MG tablet Take 0.5 tablets (5 mg total) by mouth every morning.  90 tablet  2  . simvastatin (ZOCOR) 10 MG tablet Take 10 mg by mouth at bedtime.      Marland Kitchen terazosin (HYTRIN) 10 MG capsule Take 1 capsule (10 mg total) by mouth at  bedtime.  90 capsule  2   No current facility-administered medications for this visit.    No Known Allergies  History   Social History  . Marital Status: Widowed    Spouse Name: N/A    Number of Children: N/A  . Years of Education: N/A   Occupational History  . Not on file.   Social History Main Topics  . Smoking status: Former Games developer  . Smokeless tobacco: Never Used     Comment: quit approx. 20 years ago.  . Alcohol Use: No  . Drug Use: Not on file  . Sexual Activity: Not on file   Other Topics Concern  . Not on file   Social History Narrative  . No narrative on file     Review of Systems: General: negative for chills, fever, night sweats or weight changes.  Cardiovascular: negative for chest pain, dyspnea on exertion, edema, orthopnea, palpitations, paroxysmal nocturnal dyspnea or shortness of breath Dermatological: negative for rash Respiratory: negative for cough or wheezing Urologic: negative for hematuria Abdominal: negative for nausea, vomiting, diarrhea, bright red blood per rectum, melena, or hematemesis Neurologic: negative for visual changes, syncope, or dizziness All other systems reviewed and are otherwise negative except  as noted above.    Blood pressure 122/52, pulse 50, height 5\' 6"  (1.676 m), weight 179 lb 6.4 oz (81.375 kg).  General appearance: alert, cooperative and no distress Neck: no JVD and bilat carotid bruits Lungs: clear to auscultation bilaterally Heart: regular rate and rhythm and bradyccardia  EKG: NSR, SB, LVH by volts  ASSESSMENT AND PLAN:   Bradycardia HR 50  Fatigue Possibly associated with bradycardia  Hypercholesteremia Dr Clelia Croft follows  Hypertension Controlled, LVH by EKG, diastolic dysfunction  By echo in 2009  Chronic renal disease, stage III Dr Deterding follows  PVD LCE '05, known 60% RICA Overdue for dopplers  CAD minor CAD in '02. Myoview low risk 9/11 No chest pain    PLAN  I decreased his  Pindolol to 5 mg a day. He is over due for carotid dopplers and these will be scheduled. He will see Dr Allyson Sabal after his dopplers.   [facility] 06/17/2013 11:37 AM

## 2013-06-17 NOTE — Assessment & Plan Note (Signed)
Overdue for dopplers

## 2013-06-17 NOTE — Assessment & Plan Note (Signed)
Dr Deterding follows

## 2013-06-17 NOTE — Assessment & Plan Note (Signed)
HR 50 

## 2013-07-02 ENCOUNTER — Ambulatory Visit (HOSPITAL_COMMUNITY)
Admission: RE | Admit: 2013-07-02 | Discharge: 2013-07-02 | Disposition: A | Discharge status: Home / Self Care | Payer: Medicare Other | Source: Ambulatory Visit | Attending: Cardiology | Admitting: Cardiology

## 2013-07-02 DIAGNOSIS — I739 Peripheral vascular disease, unspecified: Secondary | ICD-10-CM | POA: Insufficient documentation

## 2013-07-02 DIAGNOSIS — I6529 Occlusion and stenosis of unspecified carotid artery: Secondary | ICD-10-CM

## 2013-07-02 DIAGNOSIS — I672 Cerebral atherosclerosis: Secondary | ICD-10-CM | POA: Insufficient documentation

## 2013-07-02 NOTE — Progress Notes (Signed)
Carotid Duplex Completed. Tammra Pressman, BS, RDMS, RVT  

## 2013-07-07 ENCOUNTER — Encounter: Payer: Self-pay | Admitting: *Deleted

## 2013-07-07 ENCOUNTER — Telehealth: Payer: Self-pay | Admitting: *Deleted

## 2013-07-07 DIAGNOSIS — I6529 Occlusion and stenosis of unspecified carotid artery: Secondary | ICD-10-CM

## 2013-07-07 NOTE — Telephone Encounter (Signed)
Order placed for repeat carotid dopplers in 6 months  

## 2013-07-07 NOTE — Telephone Encounter (Signed)
Message copied by Marella Bile on Tue Jul 07, 2013 10:09 PM ------      Message from: [name]      Created: Tue Jul 07, 2013  4:46 PM       No change from prior study. Repeat in 6 months ------

## 2013-07-08 ENCOUNTER — Encounter: Payer: Self-pay | Admitting: *Deleted

## 2013-07-08 NOTE — Progress Notes (Signed)
Quick Note:  Note sent to patient ______ 

## 2013-07-24 ENCOUNTER — Encounter: Payer: Self-pay | Admitting: Physician Assistant

## 2013-07-24 DIAGNOSIS — Z87891 Personal history of nicotine dependence: Secondary | ICD-10-CM | POA: Insufficient documentation

## 2013-07-29 ENCOUNTER — Encounter: Payer: Self-pay | Admitting: Cardiovascular Disease

## 2013-07-29 ENCOUNTER — Ambulatory Visit (INDEPENDENT_AMBULATORY_CARE_PROVIDER_SITE_OTHER): Payer: Medicare Other | Admitting: Cardiovascular Disease

## 2013-07-29 VITALS — BP 140/40 | HR 56 | Ht 66.0 in | Wt 179.0 lb

## 2013-07-29 DIAGNOSIS — I10 Essential (primary) hypertension: Secondary | ICD-10-CM

## 2013-07-29 DIAGNOSIS — I739 Peripheral vascular disease, unspecified: Secondary | ICD-10-CM

## 2013-07-29 DIAGNOSIS — I2510 Atherosclerotic heart disease of native coronary artery without angina pectoris: Secondary | ICD-10-CM

## 2013-07-29 DIAGNOSIS — E7800 Pure hypercholesterolemia, unspecified: Secondary | ICD-10-CM

## 2013-07-29 MED ORDER — PINDOLOL 5 MG PO TABS: 5.0000 mg | ORAL_TABLET | Freq: Every morning | ORAL | Status: DC

## 2013-07-29 MED ORDER — SIMVASTATIN 10 MG PO TABS: 10.0000 mg | ORAL_TABLET | Freq: Every day | ORAL | Status: DC

## 2013-07-29 NOTE — Progress Notes (Signed)
07/29/2013 [name]   April 04, 1930  [id number]  Primary Physician Lupita Raider, MD Primary Cardiologist: [name] MD [name]   HPI:  The patient is a delightful 79 year old, mildly overweight, widowed Caucasian male whose wife of 52 years died in 03-25-05. He is a father of 2 and grandfather to 2 grandchildren. I saw him 6 months. His risk factors include remote tobacco abuse, hypertension and hyperlipidemia. He had noncritical CAD by cath performed by Dr. Lenise Herald December 2002. He has known carotid disease status post left carotid endarterectomy performed by Dr. Liliane Bade May 12, 2004. We have been following his carotids by duplex ultrasound. I angiogram'd him May 07, 2011, revealing a 50%-60% ostial right internal carotid artery stenosis and his Dopplers have remained stable. He is neurologically asymptomatic. His last lipid profile performed by Dr. Clelia Croft in October revealed total cholesterol 177, LDL of 108 and HDL of 50. I last saw him in the office 12/04/12. He did he lose keloid in the office 06/17/13. Since I saw him he denies chest pain or shortness of breath. He does complain of bilateral calf claudication which is lifestyle limiting.    Current Outpatient Prescriptions  Medication Sig Dispense Refill  . allopurinol (ZYLOPRIM) 100 MG tablet Take 300 mg by mouth every evening.       Marland Kitchen amLODipine (NORVASC) 10 MG tablet Take 1 tablet by mouth daily.      Marland Kitchen aspirin EC 81 MG tablet Take 81 mg by mouth every evening.      . calcitRIOL (ROCALTROL) 0.25 MCG capsule       . cholecalciferol (VITAMIN D) 1000 UNITS tablet Take 1,000 Units by mouth daily.      . clopidogrel (PLAVIX) 75 MG tablet Take 75 mg by mouth every morning.      . fish oil-omega-3 fatty acids 1000 MG capsule Take 1 g by mouth 2 (two) times daily.       . furosemide (LASIX) 80 MG tablet Take 1 tablet by mouth daily.      Marland Kitchen losartan (COZAAR) 100 MG tablet Take 100 mg by mouth daily.      .  Multiple Vitamins-Minerals (VISION-VITE PRESERVE PO) Take 1 tablet by mouth 2 (two) times daily.      . pindolol (VISKEN) 5 MG tablet Take 1 tablet (5 mg total) by mouth every morning.  90 tablet  3  . simvastatin (ZOCOR) 10 MG tablet Take 1 tablet (10 mg total) by mouth at bedtime.  90 tablet  3  . terazosin (HYTRIN) 10 MG capsule Take 1 capsule (10 mg total) by mouth at bedtime.  90 capsule  2   No current facility-administered medications for this visit.    No Known Allergies  History   Social History  . Marital Status: Widowed    Spouse Name: N/A    Number of Children: N/A  . Years of Education: N/A   Occupational History  . Not on file.   Social History Main Topics  . Smoking status: Former Games developer  . Smokeless tobacco: Never Used     Comment: quit approx. 20 years ago.  . Alcohol Use: No  . Drug Use: Not on file  . Sexual Activity: Not on file   Other Topics Concern  . Not on file   Social History Narrative  . No narrative on file     Review of Systems: General: negative for chills, fever, night sweats or weight changes.  Cardiovascular: negative for chest pain,  dyspnea on exertion, edema, orthopnea, palpitations, paroxysmal nocturnal dyspnea or shortness of breath Dermatological: negative for rash Respiratory: negative for cough or wheezing Urologic: negative for hematuria Abdominal: negative for nausea, vomiting, diarrhea, bright red blood per rectum, melena, or hematemesis Neurologic: negative for visual changes, syncope, or dizziness All other systems reviewed and are otherwise negative except as noted above.    Blood pressure 140/40, pulse 56, height 5\' 6"  (1.676 m), weight 179 lb (81.194 kg).  General appearance: alert and no distress Neck: no adenopathy, no JVD, supple, symmetrical, trachea midline, thyroid not enlarged, symmetric, no tenderness/mass/nodules and bilateral carotid bruits Lungs: clear to auscultation bilaterally Heart: regular rate and  rhythm, S1, S2 normal, no murmur, click, rub or gallop Extremities: extremities normal, atraumatic, no cyanosis or edema and 2+ femorals with soft bruits bilaterally. 2+ pedal pulses  EKG not performed today  ASSESSMENT AND PLAN:   CAD minor CAD in '02. Myoview low risk 9/11 noncritical CAD by cath performed by Dr. Jenne Campus December 2002. He had a low-risk nonischemic Myoview 06/30/10. He denies chest pain or shortness of breath.  PVD LCE '05, known 60% RICA We have been following his carotid Doppler studies which was recently performed 07/02/13 reveal moderate right ICA stenosis which has not changed in severity over the last year. I did confirm this by angiography 05/07/11. He does also complain of bilateral calf claudication. I am going to obtain lower extremity arterial Doppler studies.  Hypertension Under good control on current medications  Hypercholesteremia On statin therapy. We will recheck a fasting lipid and liver profile.      [name] MD FACP,FACC,FAHA, [facility] 07/29/2013 10:34 AM

## 2013-07-29 NOTE — Assessment & Plan Note (Signed)
noncritical CAD by cath performed by Dr. Jenne Campus December 2002. He had a low-risk nonischemic Myoview 06/30/10. He denies chest pain or shortness of breath.

## 2013-07-29 NOTE — Patient Instructions (Addendum)
  Your physician wants you to follow-up with him in : 1 year with Dr Allyson Sabal                                            and with an extender in : 6 months with Corine Shelter [facility]                    You will receive a reminder letter in the mail one month in advance. If you don't receive a letter, please call our office to schedule the follow-up appointment.     Your physician has ordered the following tests: lower extremity arterial dopplers

## 2013-07-29 NOTE — Assessment & Plan Note (Signed)
Under good control on current medications 

## 2013-07-29 NOTE — Assessment & Plan Note (Signed)
We have been following his carotid Doppler studies which was recently performed 07/02/13 reveal moderate right ICA stenosis which has not changed in severity over the last year. I did confirm this by angiography 05/07/11. He does also complain of bilateral calf claudication. I am going to obtain lower extremity arterial Doppler studies.

## 2013-07-29 NOTE — Assessment & Plan Note (Signed)
On statin therapy. We will recheck a fasting lipid and liver profile 

## 2013-07-31 ENCOUNTER — Other Ambulatory Visit: Payer: Self-pay | Admitting: Cardiovascular Disease

## 2013-07-31 NOTE — Telephone Encounter (Signed)
Rx was sent to pharmacy electronically. 

## 2013-08-07 ENCOUNTER — Encounter: Payer: Self-pay | Admitting: Cardiovascular Disease

## 2013-08-20 ENCOUNTER — Ambulatory Visit (HOSPITAL_COMMUNITY)
Admission: RE | Admit: 2013-08-20 | Discharge: 2013-08-20 | Disposition: A | Discharge status: Home / Self Care | Payer: Medicare Other | Source: Ambulatory Visit | Attending: Cardiovascular Disease | Admitting: Cardiovascular Disease

## 2013-08-20 DIAGNOSIS — I739 Peripheral vascular disease, unspecified: Secondary | ICD-10-CM

## 2013-08-20 DIAGNOSIS — I70219 Atherosclerosis of native arteries of extremities with intermittent claudication, unspecified extremity: Secondary | ICD-10-CM

## 2013-08-20 NOTE — Progress Notes (Signed)
Arterial Duplex Lower Ext. Completed. Stellarose Cerny, BS, RDMS, RVT  

## 2013-09-02 ENCOUNTER — Ambulatory Visit (INDEPENDENT_AMBULATORY_CARE_PROVIDER_SITE_OTHER): Payer: Medicare Other | Admitting: Cardiovascular Disease

## 2013-09-02 ENCOUNTER — Encounter: Payer: Self-pay | Admitting: Cardiovascular Disease

## 2013-09-02 VITALS — BP 160/64 | HR 64 | Ht 66.0 in | Wt 180.8 lb

## 2013-09-02 DIAGNOSIS — I739 Peripheral vascular disease, unspecified: Secondary | ICD-10-CM

## 2013-09-02 NOTE — Patient Instructions (Signed)
  We will see you back in follow up in April 2015 with Dr Allyson Sabal  Dr Allyson Sabal has ordered lower extremity dopplers to be done in March 2015

## 2013-09-02 NOTE — Assessment & Plan Note (Signed)
[name] was complaining of claudication. He had lower extremity arterial Doppler studies performed 08/20/13 revealing normal ABIs with moderate iliac and SFA disease. He does not say this is lifestyle limiting and at this point would prefer that we continue to follow this noninvasively.

## 2013-09-02 NOTE — Progress Notes (Signed)
[name] saw me in the office approximately one month ago. He was complaining of claudication. We'll obtain lower extremity Doppler studies which showed moderate iliac and SFA disease. At this point he wishes to continue to follow as noninvasively. I will see him back in 6 months.  [name], M.D., [facility] [address] [place], [place]  [address]  [phone] 09/02/2013 4:13 PM

## 2013-11-23 ENCOUNTER — Encounter (HOSPITAL_COMMUNITY): Payer: Self-pay | Admitting: *Deleted

## 2014-01-04 ENCOUNTER — Ambulatory Visit (HOSPITAL_COMMUNITY)
Admission: RE | Admit: 2014-01-04 | Discharge: 2014-01-04 | Disposition: A | Discharge status: Home / Self Care | Payer: Medicare Other | Source: Ambulatory Visit | Attending: Cardiovascular Disease | Admitting: Cardiovascular Disease

## 2014-01-04 DIAGNOSIS — I739 Peripheral vascular disease, unspecified: Secondary | ICD-10-CM

## 2014-01-04 DIAGNOSIS — I6529 Occlusion and stenosis of unspecified carotid artery: Secondary | ICD-10-CM | POA: Insufficient documentation

## 2014-01-04 NOTE — Progress Notes (Signed)
Carotid Duplex Completed. °Brianna L Mazza,RVT °

## 2014-01-06 ENCOUNTER — Inpatient Hospital Stay (HOSPITAL_COMMUNITY): Admission: RE | Admit: 2014-01-06 | Payer: 59 | Source: Ambulatory Visit

## 2014-01-11 ENCOUNTER — Telehealth: Payer: Self-pay | Admitting: *Deleted

## 2014-01-11 DIAGNOSIS — I779 Disorder of arteries and arterioles, unspecified: Secondary | ICD-10-CM

## 2014-01-11 DIAGNOSIS — I739 Peripheral vascular disease, unspecified: Principal | ICD-10-CM

## 2014-01-11 NOTE — Telephone Encounter (Signed)
Message copied by Marella BileVOGEL, Ajaya Crutchfield W. on Mon Jan 11, 2014 11:21 AM ------      Message from: Runell GessBERRY, JONATHAN J      Created: Sun Jan 10, 2014  6:47 PM       Increase in bilateral ICA velocities. Repeat in 6 months ------

## 2014-01-11 NOTE — Telephone Encounter (Signed)
Order placed for repeat carotid dopplers in 6 months  

## 2014-01-12 ENCOUNTER — Ambulatory Visit (HOSPITAL_COMMUNITY)
Admission: RE | Admit: 2014-01-12 | Discharge: 2014-01-12 | Disposition: A | Discharge status: Home / Self Care | Payer: Medicare Other | Source: Ambulatory Visit | Attending: Cardiovascular Disease | Admitting: Cardiovascular Disease

## 2014-01-12 DIAGNOSIS — I70219 Atherosclerosis of native arteries of extremities with intermittent claudication, unspecified extremity: Secondary | ICD-10-CM

## 2014-01-12 DIAGNOSIS — I739 Peripheral vascular disease, unspecified: Secondary | ICD-10-CM

## 2014-01-12 NOTE — Progress Notes (Signed)
Arterial Duplex Lower Ext. Completed. Bryker Fletchall, BS, RDMS, RVT  

## 2014-01-18 ENCOUNTER — Telehealth: Payer: Self-pay | Admitting: *Deleted

## 2014-01-18 ENCOUNTER — Encounter: Payer: Self-pay | Admitting: *Deleted

## 2014-01-18 DIAGNOSIS — I739 Peripheral vascular disease, unspecified: Secondary | ICD-10-CM

## 2014-01-18 NOTE — Telephone Encounter (Signed)
Message copied by Marella BileVOGEL, Svea Pusch W. on Mon Jan 18, 2014  9:44 PM ------      Message from: Runell GessBERRY, JONATHAN J      Created: Mon Jan 18, 2014  7:02 AM       No change from prior study. Repeat in 6 months ------

## 2014-01-18 NOTE — Telephone Encounter (Signed)
Order placed for repeat lower extremity arterial doppler in 6 months  

## 2014-02-25 ENCOUNTER — Other Ambulatory Visit: Payer: Self-pay

## 2014-02-25 MED ORDER — TERAZOSIN HCL 10 MG PO CAPS: 10.0000 mg | ORAL_CAPSULE | Freq: Every day | ORAL | Status: DC

## 2014-02-25 NOTE — Telephone Encounter (Signed)
Rx was sent to pharmacy electronically. 

## 2014-04-27 ENCOUNTER — Encounter: Payer: Self-pay | Admitting: Cardiovascular Disease

## 2014-05-25 ENCOUNTER — Other Ambulatory Visit: Payer: Self-pay | Admitting: Cardiovascular Disease

## 2014-06-02 ENCOUNTER — Telehealth (HOSPITAL_COMMUNITY): Payer: Self-pay | Admitting: *Deleted

## 2014-06-03 NOTE — Telephone Encounter (Signed)
Moved up his appt to 8/7 due to symptoms of palpitations. Patient agreeable

## 2014-06-04 ENCOUNTER — Encounter: Payer: Self-pay | Admitting: Radiology

## 2014-06-04 ENCOUNTER — Encounter: Payer: Self-pay | Admitting: Cardiology

## 2014-06-04 ENCOUNTER — Ambulatory Visit (INDEPENDENT_AMBULATORY_CARE_PROVIDER_SITE_OTHER): Payer: Medicare Other | Admitting: Cardiology

## 2014-06-04 ENCOUNTER — Encounter (INDEPENDENT_AMBULATORY_CARE_PROVIDER_SITE_OTHER): Payer: Medicare Other

## 2014-06-04 VITALS — BP 189/52 | HR 68 | Ht 66.0 in | Wt 165.1 lb

## 2014-06-04 DIAGNOSIS — R55 Syncope and collapse: Secondary | ICD-10-CM

## 2014-06-04 DIAGNOSIS — I498 Other specified cardiac arrhythmias: Secondary | ICD-10-CM

## 2014-06-04 DIAGNOSIS — I739 Peripheral vascular disease, unspecified: Secondary | ICD-10-CM

## 2014-06-04 DIAGNOSIS — N183 Chronic kidney disease, stage 3 unspecified: Secondary | ICD-10-CM

## 2014-06-04 DIAGNOSIS — I10 Essential (primary) hypertension: Secondary | ICD-10-CM

## 2014-06-04 DIAGNOSIS — R001 Bradycardia, unspecified: Secondary | ICD-10-CM

## 2014-06-04 DIAGNOSIS — R002 Palpitations: Secondary | ICD-10-CM

## 2014-06-04 NOTE — Assessment & Plan Note (Signed)
Last dopplers in March 2015- suggests progression

## 2014-06-04 NOTE — Assessment & Plan Note (Signed)
No angina 

## 2014-06-04 NOTE — Progress Notes (Signed)
06/04/2014 [name]   04/04/1930  [id number]  Primary Physicia SHAW,[name], MD Primary Cardiologist: Dr Allyson SabalBerry  HPI:  The patient is an 80 year old, mildly overweight, widowed Caucasian male. He is a father of 2 and grandfather to 2 grandchildren. He had noncritical CAD by cath performed by Dr. Lenise Heraldobert McQueen December 2002, and had a low risk Myoview 9/11. He has known carotid disease status post left carotid endarterectomy performed by Dr. Liliane BadeGreg Hayes May 12, 2004. We have been following his carotids by duplex ultrasound. Dr Allyson SabalBerry angiogram'd him May 07, 2011, revealing a 50%-60% ostial right internal carotid artery stenosis and his dopplers have remained stable. He is neurologically asymptomatic. His last dopplers were March 2015. He has stage 3 CRI and is followed by Dr Deterding. His renal arteries were "widley patent" at angiogram in 2008.He has had some symptoms consistent with claudication. LE dopplers done March 2015 showed ABIs of 1.0 and 0.9. He has 2+ distal pulses on exam. He has an abdominal bruit. His abd US in 2008 was negative for an AAA.               He is seen in the office today with complaints of palpitations. He says he has noted "hard heart beats" at night. This actually wakes him up. He denies any tachycardia. He has had some episodes of near syncope associated with nausea that pass spontaneously. He denies chest pain or any unusual SOB.     Current Outpatient Prescriptions  Medication Sig Dispense Refill  . allopurinol (ZYLOPRIM) 100 MG tablet Take 300 mg by mouth daily.       Marland Kitchen. amLODipine (NORVASC) 10 MG tablet Take 1 tablet by mouth daily.      Marland Kitchen. aspirin EC 81 MG tablet Take 81 mg by mouth every evening.      . calcitRIOL (ROCALTROL) 0.25 MCG capsule Take 0.25 mcg by mouth daily.       . cholecalciferol (VITAMIN D) 1000 UNITS tablet Take 1,000 Units by mouth daily.      . clopidogrel (PLAVIX) 75 MG tablet TAKE 1 TABLET BY MOUTH DAILY  30 tablet  4  . furosemide  (LASIX) 80 MG tablet Take 40 mg by mouth 2 (two) times daily.       Marland Kitchen. losartan (COZAAR) 100 MG tablet Take 100 mg by mouth 2 (two) times daily.       . Multiple Vitamins-Minerals (PRESERVISION AREDS PO) Take 2 tablets by mouth daily.      . simvastatin (ZOCOR) 10 MG tablet Take 1 tablet (10 mg total) by mouth at bedtime.  90 tablet  3  . terazosin (HYTRIN) 10 MG capsule Take 1 capsule (10 mg total) by mouth at bedtime.  90 capsule  2  . TRADJENTA 5 MG TABS tablet Take 5 mg by mouth daily.       No current facility-administered medications for this visit.    No Known Allergies  History   Social History  . Marital Status: Widowed    Spouse Name: N/A    Number of Children: N/A  . Years of Education: N/A   Occupational History  . Not on file.   Social History Main Topics  . Smoking status: Former Smoker -- 1.00 packs/day for 40 years  . Smokeless tobacco: Never Used     Comment: quit approx. 20 years ago.  . Alcohol Use: No  . Drug Use: Not on file  . Sexual Activity: Not on file   Other  Topics Concern  . Not on file   Social History Narrative  . No narrative on file     Review of Systems: General: negative for chills, fever, night sweats or weight changes.  Cardiovascular: negative for chest pain, dyspnea on exertion, edema, orthopnea, palpitations, paroxysmal nocturnal dyspnea or shortness of breath Dermatological: negative for rash Respiratory: negative for cough or wheezing Urologic: negative for hematuria Abdominal: negative for nausea, vomiting, diarrhea, bright red blood per rectum, melena, or hematemesis Neurologic: negative for visual changes, syncope, or dizziness All other systems reviewed and are otherwise negative except as noted above.    Blood pressure 189/52, pulse 68, height 5\' 6"  (1.676 m), weight 165 lb 1.6 oz (74.889 kg).  General appearance: alert, cooperative and no distress Neck: no JVD and bilateral carotid bruits Lungs: clear to auscultation  bilaterally Heart: regular rate and rhythm and 2/6 short systolic murmur at AOv area Abdomen: soft, non-tender; bowel sounds normal; no masses,  no organomegaly and ventral hernia noted. Midline systolic bruit noted Extremities: extremities normal, atraumatic, no cyanosis or edema Pulses: 2+ and symmetric Skin: Skin color, texture, turgor normal. No rashes or lesions Neurologic: Grossly normal  EKG NSR- 68- LVH  ASSESSMENT AND PLAN:   Hypertension Poor control. Widely patent renal arteries in 2008 at angiogram  Palpitations Irregular beats waking the pt up at night  Near syncope He has recently noted near syncopal spells associated with nausea  CAD minor CAD in '02. Myoview low risk 9/11 No angina  PVD LCE '05, known 60% RICA Last dopplers in March 2015- suggests progression  Chronic renal disease, stage III Followed by Dr Deterding  Bradycardia Chronic and previously asymptomatic.   PLAN  Repeat B/P 158/50. I suggested we proceed with a 48 hr Holter. He says he has been having palpitations nightly so we should be able to see what his symptoms are from. I did not change his medications.   [facility] KPA-C 06/04/2014 9:37 AM

## 2014-06-04 NOTE — Assessment & Plan Note (Signed)
Followed by Dr Deterding 

## 2014-06-04 NOTE — Assessment & Plan Note (Signed)
Irregular beats waking the pt up at night

## 2014-06-04 NOTE — Assessment & Plan Note (Signed)
Poor control. Widely patent renal arteries in 2008 at angiogram

## 2014-06-04 NOTE — Assessment & Plan Note (Signed)
Chronic and previously asymptomatic.

## 2014-06-04 NOTE — Patient Instructions (Signed)
Corine ShelterLuke Kilroy, PA-C has recommended that you wear a holter monitor. Holter monitors are medical devices that record the heart's electrical activity. Doctors most often use these monitors to diagnose arrhythmias. Arrhythmias are problems with the speed or rhythm of the heartbeat. The monitor is a small, portable device. You can wear one while you do your normal daily activities. This is usually used to diagnose what is causing palpitations/syncope (passing out). This will have to be scheduled [facility].  Your physician recommends that you schedule a follow-up appointment in 1 month with Dr Nanetta BattyJonathan Berry.

## 2014-06-04 NOTE — Assessment & Plan Note (Signed)
He has recently noted near syncopal spells associated with nausea

## 2014-06-04 NOTE — Progress Notes (Signed)
Patient ID: [name] C Winkel, male   DOB: 03/20/1930, 80 y.o.   MRN: [id number] 48 hr [employer] holter applied

## 2014-06-17 ENCOUNTER — Ambulatory Visit (INDEPENDENT_AMBULATORY_CARE_PROVIDER_SITE_OTHER): Payer: Medicare Other | Admitting: Cardiovascular Disease

## 2014-06-17 ENCOUNTER — Encounter: Payer: Self-pay | Admitting: Cardiovascular Disease

## 2014-06-17 VITALS — BP 161/76 | HR 71 | Ht 66.0 in | Wt 165.0 lb

## 2014-06-17 DIAGNOSIS — Z79899 Other long term (current) drug therapy: Secondary | ICD-10-CM

## 2014-06-17 DIAGNOSIS — E7800 Pure hypercholesterolemia, unspecified: Secondary | ICD-10-CM

## 2014-06-17 DIAGNOSIS — R002 Palpitations: Secondary | ICD-10-CM

## 2014-06-17 DIAGNOSIS — I739 Peripheral vascular disease, unspecified: Secondary | ICD-10-CM

## 2014-06-17 DIAGNOSIS — I10 Essential (primary) hypertension: Secondary | ICD-10-CM

## 2014-06-17 NOTE — Assessment & Plan Note (Signed)
The patient saw Corine ShelterLuke Kilroy PA-C in the office approximately 2 weeks ago complaining of nocturnal palpitations. 848 hour Holter monitor revealed sinus rhythm with occasional PVCs and short runs of PSVT. I have reassured him that these are relatively benign I have offered him low-dose beta-blockade however the patient desires not to be put on an additional medication.

## 2014-06-17 NOTE — Assessment & Plan Note (Signed)
History of carotid artery the artery disease status post left internal carotid artery endarterectomy by Dr. Madilyn FiremanHayes 05/07/04. I angiogram him after an abnormal Doppler study 05/07/11 revealing a 50 a 60% ostial right internal carotid artery stenosis which I will elect to treat medically with surveillance Dopplers. He remained neurologically asymptomatic.

## 2014-06-17 NOTE — Assessment & Plan Note (Signed)
Controlled on current medications 

## 2014-06-17 NOTE — Assessment & Plan Note (Signed)
On statin therapy. We will repeat check a lipid and liver profile

## 2014-06-17 NOTE — Patient Instructions (Signed)
  We will see you back in follow up in 6 months with Dr Berry.   Dr Berry has ordered: Your physician recommends that you return for a FASTING lipid profile    

## 2014-06-17 NOTE — Progress Notes (Signed)
06/17/2014 [name]   06/15/1930  [id number]  Primary Physician Lupita Raider, MD Primary Cardiologist: [name] MD [name]   HPI:  The patient is an 80 year old, mildly overweight, widowed Caucasian male. He is a father of 2 and grandfather to 2 grandchildren. He had noncritical CAD by cath performed by Dr. Lenise Herald December 2002, and had a low risk Myoview 9/11. He has known carotid disease status post left carotid endarterectomy performed by Dr. Liliane Bade May 12, 2004. We have been following his carotids by duplex ultrasound. Dr Allyson Sabal angiogram'd him May 07, 2011, revealing a 50%-60% ostial right internal carotid artery stenosis and his dopplers have remained stable. He is neurologically asymptomatic. His last dopplers were March 2015. He has stage 3 CRI and is followed by Dr Deterding. His renal arteries were "widley patent" at angiogram in 2008.He has had some symptoms consistent with claudication. LE dopplers done March 2015 showed ABIs of 1.0 and 0.9. He has 2+ distal pulses on exam. He has an abdominal bruit. His abd [place] in 2008 was negative for an AAA.   [name] saw [name] [facility] in the office to [place] complaining of nocturnal palpitations.. 8. Hour Holter monitor showed sinus rhythm with occasional PVCs and runs of PSVT. He denies chest pain or shortness of breath.    Current Outpatient Prescriptions  Medication Sig Dispense Refill  . allopurinol (ZYLOPRIM) 100 MG tablet Take 300 mg by mouth daily.       Marland Kitchen amLODipine (NORVASC) 10 MG tablet Take 1 tablet by mouth daily.      Marland Kitchen aspirin EC 81 MG tablet Take 81 mg by mouth every evening.      . calcitRIOL (ROCALTROL) 0.25 MCG capsule Take 0.25 mcg by mouth daily.       . cholecalciferol (VITAMIN D) 1000 UNITS tablet Take 1,000 Units by mouth daily.      . clopidogrel (PLAVIX) 75 MG tablet TAKE 1 TABLET BY MOUTH DAILY  30 tablet  4  . furosemide (LASIX) 80 MG tablet Take 40 mg by mouth 2 (two)  times daily.       Marland Kitchen losartan (COZAAR) 100 MG tablet Take 100 mg by mouth 2 (two) times daily.       . Multiple Vitamins-Minerals (PRESERVISION AREDS PO) Take 2 tablets by mouth daily.      . simvastatin (ZOCOR) 10 MG tablet Take 1 tablet (10 mg total) by mouth at bedtime.  90 tablet  3  . terazosin (HYTRIN) 10 MG capsule Take 1 capsule (10 mg total) by mouth at bedtime.  90 capsule  2  . TRADJENTA 5 MG TABS tablet Take 5 mg by mouth daily.       No current facility-administered medications for this visit.    No Known Allergies  History   Social History  . Marital Status: Widowed    Spouse Name: N/A    Number of Children: N/A  . Years of Education: N/A   Occupational History  . Not on file.   Social History Main Topics  . Smoking status: Former Smoker -- 1.00 packs/day for 40 years  . Smokeless tobacco: Never Used     Comment: quit approx. 20 years ago.  . Alcohol Use: No  . Drug Use: Not on file  . Sexual Activity: Not on file   Other Topics Concern  . Not on file   Social History Narrative  . No narrative on file  Review of Systems: General: negative for chills, fever, night sweats or weight changes.  Cardiovascular: negative for chest pain, dyspnea on exertion, edema, orthopnea, palpitations, paroxysmal nocturnal dyspnea or shortness of breath Dermatological: negative for rash Respiratory: negative for cough or wheezing Urologic: negative for hematuria Abdominal: negative for nausea, vomiting, diarrhea, bright red blood per rectum, melena, or hematemesis Neurologic: negative for visual changes, syncope, or dizziness All other systems reviewed and are otherwise negative except as noted above.    Blood pressure 161/76, pulse 71, height 5\' 6"  (1.676 m), weight 165 lb (74.844 kg).  General appearance: alert and no distress Neck: no adenopathy, no JVD, supple, symmetrical, trachea midline, thyroid not enlarged, symmetric, no tenderness/mass/nodules and loud  bilateral carotid bruits right greater than left Lungs: clear to auscultation bilaterally Heart: soft outflow tract murmur Extremities: extremities normal, atraumatic, no cyanosis or edema  EKG not performed today  ASSESSMENT AND PLAN:   PVD LCE '05, known 60% RICA History of carotid artery the artery disease status post left internal carotid artery endarterectomy by Dr. Madilyn FiremanHayes 05/07/04. I angiogram him after an abnormal Doppler study 05/07/11 revealing a 50 a 60% ostial right internal carotid artery stenosis which I will elect to treat medically with surveillance Dopplers. He remained neurologically asymptomatic.  Hypertension Controlled on current medications  Hypercholesteremia On statin therapy. We will repeat check a lipid and liver profile  Palpitations The patient saw Corine ShelterLuke Kilroy PA-C in the office approximately 2 weeks ago complaining of nocturnal palpitations. 848 hour Holter monitor revealed sinus rhythm with occasional PVCs and short runs of PSVT. I have reassured him that these are relatively benign I have offered him low-dose beta-blockade however the patient desires not to be put on an additional medication.      Runell GessJonathan J. Berry MD FACP,FACC,FAHA, [facility] 06/17/2014 12:26 PM

## 2014-06-18 ENCOUNTER — Ambulatory Visit: Payer: Medicare Other | Admitting: Cardiology

## 2014-06-22 LAB — HEPATIC FUNCTION PANEL
ALK PHOS: 74 U/L (ref 39–117)
ALT: 11 U/L (ref 0–53)
AST: 16 U/L (ref 0–37)
Albumin: 3.6 g/dL (ref 3.5–5.2)
BILIRUBIN DIRECT: 0.1 mg/dL (ref 0.0–0.3)
Indirect Bilirubin: 0.3 mg/dL (ref 0.2–1.2)
Total Bilirubin: 0.4 mg/dL (ref 0.2–1.2)
Total Protein: 5.4 g/dL — ABNORMAL LOW (ref 6.0–8.3)

## 2014-06-22 LAB — LIPID PANEL
Cholesterol: 139 mg/dL (ref 0–200)
HDL: 41 mg/dL (ref >39)
LDL Cholesterol: 75 mg/dL (ref 0–99)
Total CHOL/HDL Ratio: 3.4 Ratio
Triglycerides: 116 mg/dL (ref <150)
VLDL: 23 mg/dL (ref 0–40)

## 2014-06-28 ENCOUNTER — Encounter: Payer: Self-pay | Admitting: *Deleted

## 2014-07-12 ENCOUNTER — Ambulatory Visit: Payer: Medicare Other | Admitting: Cardiovascular Disease

## 2014-07-21 ENCOUNTER — Other Ambulatory Visit: Payer: Self-pay | Admitting: Cardiovascular Disease

## 2014-07-21 NOTE — Telephone Encounter (Signed)
Rx was sent to pharmacy electronically. 

## 2014-07-22 ENCOUNTER — Ambulatory Visit (HOSPITAL_BASED_OUTPATIENT_CLINIC_OR_DEPARTMENT_OTHER)
Admission: RE | Admit: 2014-07-22 | Discharge: 2014-07-22 | Disposition: A | Discharge status: Home / Self Care | Payer: Medicare Other | Source: Ambulatory Visit | Attending: Cardiovascular Disease | Admitting: Cardiovascular Disease

## 2014-07-22 ENCOUNTER — Ambulatory Visit (HOSPITAL_COMMUNITY)
Admission: RE | Admit: 2014-07-22 | Discharge: 2014-07-22 | Disposition: A | Discharge status: Home / Self Care | Payer: Medicare Other | Source: Ambulatory Visit | Attending: Cardiovascular Disease | Admitting: Cardiovascular Disease

## 2014-07-22 DIAGNOSIS — I6529 Occlusion and stenosis of unspecified carotid artery: Secondary | ICD-10-CM

## 2014-07-22 DIAGNOSIS — I739 Peripheral vascular disease, unspecified: Secondary | ICD-10-CM

## 2014-07-22 DIAGNOSIS — I779 Disorder of arteries and arterioles, unspecified: Secondary | ICD-10-CM | POA: Diagnosis present

## 2014-07-22 NOTE — Progress Notes (Signed)
Carotid Duplex Completed. °Brianna L Mazza,RVT °

## 2014-07-22 NOTE — Progress Notes (Signed)
Lower Extremity Arterial Duplex Completed. °Brianna L Mazza,RVT °

## 2014-07-28 ENCOUNTER — Encounter (HOSPITAL_COMMUNITY)
Admission: RE | Admit: 2014-07-28 | Discharge: 2014-07-28 | Disposition: A | Discharge status: Home / Self Care | Payer: Medicare Other | Source: Ambulatory Visit | Attending: Nephrology | Admitting: Nephrology

## 2014-07-28 DIAGNOSIS — D631 Anemia in chronic kidney disease: Secondary | ICD-10-CM | POA: Insufficient documentation

## 2014-07-28 DIAGNOSIS — N039 Chronic nephritic syndrome with unspecified morphologic changes: Principal | ICD-10-CM

## 2014-07-28 DIAGNOSIS — N183 Chronic kidney disease, stage 3 unspecified: Secondary | ICD-10-CM | POA: Insufficient documentation

## 2014-07-28 LAB — POCT HEMOGLOBIN-HEMACUE: Hemoglobin: 7.8 g/dL — ABNORMAL LOW (ref 13.0–17.0)

## 2014-07-28 MED ORDER — EPOETIN ALFA 20000 UNIT/ML IJ SOLN
INTRAMUSCULAR | Status: AC
  Filled 2014-07-28: qty 1

## 2014-07-28 MED ORDER — EPOETIN ALFA 20000 UNIT/ML IJ SOLN
20000.0000 [IU] | INTRAMUSCULAR | Status: DC
  Administered 2014-07-28: 20000 [IU] via SUBCUTANEOUS

## 2014-07-28 NOTE — Discharge Instructions (Signed)

## 2014-08-02 ENCOUNTER — Encounter (INDEPENDENT_AMBULATORY_CARE_PROVIDER_SITE_OTHER): Payer: Medicare Other | Admitting: Ophthalmology

## 2014-08-02 DIAGNOSIS — I10 Essential (primary) hypertension: Secondary | ICD-10-CM

## 2014-08-02 DIAGNOSIS — H43813 Vitreous degeneration, bilateral: Secondary | ICD-10-CM

## 2014-08-02 DIAGNOSIS — H35033 Hypertensive retinopathy, bilateral: Secondary | ICD-10-CM

## 2014-08-02 DIAGNOSIS — H3531 Nonexudative age-related macular degeneration: Secondary | ICD-10-CM

## 2014-08-11 ENCOUNTER — Encounter (HOSPITAL_COMMUNITY)
Admission: RE | Admit: 2014-08-11 | Discharge: 2014-08-11 | Disposition: A | Discharge status: Home / Self Care | Payer: Medicare Other | Source: Ambulatory Visit | Attending: Nephrology | Admitting: Nephrology

## 2014-08-11 DIAGNOSIS — D631 Anemia in chronic kidney disease: Secondary | ICD-10-CM | POA: Diagnosis present

## 2014-08-11 DIAGNOSIS — N183 Chronic kidney disease, stage 3 (moderate): Secondary | ICD-10-CM | POA: Diagnosis not present

## 2014-08-11 LAB — POCT HEMOGLOBIN-HEMACUE: Hemoglobin: 7.6 g/dL — ABNORMAL LOW (ref 13.0–17.0)

## 2014-08-11 MED ORDER — EPOETIN ALFA 20000 UNIT/ML IJ SOLN
20000.0000 [IU] | INTRAMUSCULAR | Status: DC
  Administered 2014-08-11: 20000 [IU] via SUBCUTANEOUS

## 2014-08-11 MED ORDER — EPOETIN ALFA 20000 UNIT/ML IJ SOLN
INTRAMUSCULAR | Status: DC
Start: 2014-08-11 — End: 2014-08-12
  Filled 2014-08-11: qty 1

## 2014-08-18 ENCOUNTER — Encounter (HOSPITAL_COMMUNITY): Payer: Medicare Other

## 2014-08-23 ENCOUNTER — Ambulatory Visit (INDEPENDENT_AMBULATORY_CARE_PROVIDER_SITE_OTHER): Payer: Medicare Other | Admitting: Cardiovascular Disease

## 2014-08-23 ENCOUNTER — Encounter: Payer: Self-pay | Admitting: Cardiovascular Disease

## 2014-08-23 VITALS — BP 163/53 | HR 64 | Ht 66.0 in | Wt 168.8 lb

## 2014-08-23 DIAGNOSIS — I739 Peripheral vascular disease, unspecified: Secondary | ICD-10-CM

## 2014-08-23 NOTE — Assessment & Plan Note (Signed)
We reviewed his recent lower extremity arterial Doppler studies performed in September that showed progression of his superficial femoral arterial disease bilaterally with moderate decrease in in his ABIs. He does complain of some lifestyle limiting claudication though this does not substantially affect his activities of daily living. Addition, he has moderate renal insufficiency which limits percutaneous options. At this point, principally continue surveillance and conservative therapy.

## 2014-08-23 NOTE — Patient Instructions (Signed)
Your physician wants you to follow-up in 6 months with Dr. Berry. You will receive a reminder letter in the mail 2 months in advance. If you do not receive a letter, please call our office to schedule the follow-up appointment.  

## 2014-08-23 NOTE — Assessment & Plan Note (Signed)
We reviewed his recent carotid Doppler studies performed in September which showed mild progression of his right internal carotid artery stenosis. He remains neurologically asymptomatic on aspirin and Plavix. We will continue to follow him by duplex ultrasound

## 2014-08-23 NOTE — Progress Notes (Signed)
[name] returns here for follow-up of his lower extremity Dopplers as well as a carotid Dopplers both of which have shown some progression of disease. Despite the mild decrease in his ABIs bilaterally and increase in his superficial femoral artery frequencies he denies significant limitation in his daily activities as a result of claudication. In addition, because of his mild to moderate renal insufficiency intervention would place him at some risk for radiocontrast nephropathy. Patient is currently decided to continue to treat him conservatively and obtain surveillance Doppler studies.   Bryan GessJonathan J. Swanson Duran, M.D., FACP, [facility]ACC, Earl LagosFAHA, [facility] [address] [place], [place]  [id number]  [phone] 08/23/2014 3:16 PM

## 2014-08-25 ENCOUNTER — Encounter (HOSPITAL_COMMUNITY)
Admission: RE | Admit: 2014-08-25 | Discharge: 2014-08-25 | Disposition: A | Discharge status: Home / Self Care | Payer: Medicare Other | Source: Ambulatory Visit | Attending: Nephrology | Admitting: Nephrology

## 2014-08-25 DIAGNOSIS — D631 Anemia in chronic kidney disease: Secondary | ICD-10-CM | POA: Diagnosis not present

## 2014-08-25 LAB — IRON AND TIBC
Iron: 25 ug/dL — ABNORMAL LOW (ref 42–135)
SATURATION RATIOS: 9 % — AB (ref 20–55)
TIBC: 276 ug/dL (ref 215–435)
UIBC: 251 ug/dL (ref 125–400)

## 2014-08-25 LAB — FERRITIN: Ferritin: 11 ng/mL — ABNORMAL LOW (ref 22–322)

## 2014-08-25 MED ORDER — EPOETIN ALFA 10000 UNIT/ML IJ SOLN
INTRAMUSCULAR | Status: AC
  Administered 2014-08-25: 10000 [IU] via SUBCUTANEOUS
  Filled 2014-08-25: qty 1

## 2014-08-25 MED ORDER — EPOETIN ALFA 20000 UNIT/ML IJ SOLN
INTRAMUSCULAR | Status: AC
  Administered 2014-08-25: 20000 [IU] via SUBCUTANEOUS
  Filled 2014-08-25: qty 1

## 2014-08-25 MED ORDER — EPOETIN ALFA 20000 UNIT/ML IJ SOLN: 20000.0000 [IU] | INTRAMUSCULAR | Status: DC

## 2014-08-25 MED ORDER — EPOETIN ALFA 40000 UNIT/ML IJ SOLN: 30000.0000 [IU] | Freq: Once | INTRAMUSCULAR | Status: DC

## 2014-08-26 LAB — POCT HEMOGLOBIN-HEMACUE: HEMOGLOBIN: 7.5 g/dL — AB (ref 13.0–17.0)

## 2014-09-07 ENCOUNTER — Other Ambulatory Visit (HOSPITAL_COMMUNITY): Payer: Self-pay | Admitting: *Deleted

## 2014-09-08 ENCOUNTER — Encounter (HOSPITAL_COMMUNITY)
Admission: RE | Admit: 2014-09-08 | Discharge: 2014-09-08 | Disposition: A | Discharge status: Home / Self Care | Payer: Medicare Other | Source: Ambulatory Visit | Attending: Nephrology | Admitting: Nephrology

## 2014-09-08 DIAGNOSIS — N183 Chronic kidney disease, stage 3 (moderate): Secondary | ICD-10-CM | POA: Diagnosis not present

## 2014-09-08 DIAGNOSIS — D631 Anemia in chronic kidney disease: Secondary | ICD-10-CM | POA: Insufficient documentation

## 2014-09-08 LAB — POCT HEMOGLOBIN-HEMACUE: Hemoglobin: 7.6 g/dL — ABNORMAL LOW (ref 13.0–17.0)

## 2014-09-08 MED ORDER — EPOETIN ALFA 40000 UNIT/ML IJ SOLN: 30000.0000 [IU] | INTRAMUSCULAR | Status: DC

## 2014-09-08 MED ORDER — EPOETIN ALFA 10000 UNIT/ML IJ SOLN
INTRAMUSCULAR | Status: AC
  Administered 2014-09-08: 10000 [IU] via SUBCUTANEOUS
  Filled 2014-09-08: qty 1

## 2014-09-08 MED ORDER — EPOETIN ALFA 20000 UNIT/ML IJ SOLN
INTRAMUSCULAR | Status: AC
  Administered 2014-09-08: 20000 [IU] via SUBCUTANEOUS
  Filled 2014-09-08: qty 1

## 2014-09-08 MED ORDER — SODIUM CHLORIDE 0.9 % IV SOLN
510.0000 mg | Freq: Once | INTRAVENOUS | Status: DC
  Administered 2014-09-08: 510 mg via INTRAVENOUS
  Filled 2014-09-08: qty 17

## 2014-09-08 NOTE — Discharge Instructions (Signed)

## 2014-09-15 ENCOUNTER — Encounter (HOSPITAL_COMMUNITY)
Admission: RE | Admit: 2014-09-15 | Discharge: 2014-09-15 | Disposition: A | Discharge status: Home / Self Care | Payer: Medicare Other | Source: Ambulatory Visit | Attending: Nephrology | Admitting: Nephrology

## 2014-09-15 DIAGNOSIS — D631 Anemia in chronic kidney disease: Secondary | ICD-10-CM | POA: Diagnosis not present

## 2014-09-15 LAB — POCT HEMOGLOBIN-HEMACUE: Hemoglobin: 7.8 g/dL — ABNORMAL LOW (ref 13.0–17.0)

## 2014-09-15 MED ORDER — EPOETIN ALFA 40000 UNIT/ML IJ SOLN: 30000.0000 [IU] | INTRAMUSCULAR | Status: DC

## 2014-09-15 MED ORDER — EPOETIN ALFA 10000 UNIT/ML IJ SOLN
INTRAMUSCULAR | Status: AC
  Administered 2014-09-15: 10000 [IU] via SUBCUTANEOUS
  Filled 2014-09-15: qty 1

## 2014-09-15 MED ORDER — EPOETIN ALFA 20000 UNIT/ML IJ SOLN
INTRAMUSCULAR | Status: AC
  Administered 2014-09-15: 20000 [IU] via SUBCUTANEOUS
  Filled 2014-09-15: qty 1

## 2014-09-15 MED ORDER — SODIUM CHLORIDE 0.9 % IV SOLN
510.0000 mg | Freq: Once | INTRAVENOUS | Status: AC
  Administered 2014-09-15: 510 mg via INTRAVENOUS
  Filled 2014-09-15: qty 17

## 2014-09-20 ENCOUNTER — Other Ambulatory Visit (HOSPITAL_COMMUNITY): Payer: Self-pay | Admitting: *Deleted

## 2014-09-21 ENCOUNTER — Encounter (HOSPITAL_COMMUNITY): Payer: Medicare Other

## 2014-09-21 ENCOUNTER — Encounter (HOSPITAL_COMMUNITY)
Admission: RE | Admit: 2014-09-21 | Discharge: 2014-09-21 | Disposition: A | Discharge status: Home / Self Care | Payer: Medicare Other | Source: Ambulatory Visit | Attending: Nephrology | Admitting: Nephrology

## 2014-09-21 DIAGNOSIS — D631 Anemia in chronic kidney disease: Secondary | ICD-10-CM | POA: Diagnosis not present

## 2014-09-21 LAB — POCT HEMOGLOBIN-HEMACUE: Hemoglobin: 9.1 g/dL — ABNORMAL LOW (ref 13.0–17.0)

## 2014-09-21 MED ORDER — EPOETIN ALFA 20000 UNIT/ML IJ SOLN
INTRAMUSCULAR | Status: AC
  Administered 2014-09-21: 20000 [IU] via SUBCUTANEOUS
  Filled 2014-09-21: qty 1

## 2014-09-21 MED ORDER — EPOETIN ALFA 10000 UNIT/ML IJ SOLN
INTRAMUSCULAR | Status: AC
  Administered 2014-09-21: 10000 [IU] via SUBCUTANEOUS
  Filled 2014-09-21: qty 1

## 2014-09-21 MED ORDER — EPOETIN ALFA 40000 UNIT/ML IJ SOLN: 30000.0000 [IU] | INTRAMUSCULAR | Status: DC

## 2014-09-22 ENCOUNTER — Encounter: Payer: Self-pay | Admitting: Cardiovascular Disease

## 2014-09-22 ENCOUNTER — Ambulatory Visit (INDEPENDENT_AMBULATORY_CARE_PROVIDER_SITE_OTHER): Payer: Medicare Other | Admitting: Cardiovascular Disease

## 2014-09-22 VITALS — BP 180/46 | HR 64 | Ht 66.0 in | Wt 169.0 lb

## 2014-09-22 DIAGNOSIS — I2510 Atherosclerotic heart disease of native coronary artery without angina pectoris: Secondary | ICD-10-CM

## 2014-09-22 DIAGNOSIS — E7800 Pure hypercholesterolemia, unspecified: Secondary | ICD-10-CM

## 2014-09-22 DIAGNOSIS — I739 Peripheral vascular disease, unspecified: Secondary | ICD-10-CM

## 2014-09-22 DIAGNOSIS — I10 Essential (primary) hypertension: Secondary | ICD-10-CM

## 2014-09-22 DIAGNOSIS — R002 Palpitations: Secondary | ICD-10-CM

## 2014-09-22 NOTE — Assessment & Plan Note (Signed)
History of hypertension with blood pressure measured today in the office at 180/46. He is on amlodipine 10 mg a day furosemide losartan 100.. The patient consider adding hydralazine in the future.

## 2014-09-22 NOTE — Progress Notes (Signed)
09/22/2014 Pasty Archoy C Zoll   02/13/1930  [id number]  Primary Physician [name],[name], MD Primary Cardiologist: Runell GessJonathan J. Chastity Noland MD Roseanne RenoFACP,FACC,FAHA, FSCAI   HPI:  The patient is an [age] year old, mildly overweight, widowed Caucasian male. He is a father of 2 and grandfather to 2 grandchildren.I last saw him 08/23/14. He had noncritical CAD by cath performed by Dr. Lenise Heraldobert McQueen December 2002, and had a low risk Myoview 9/11. He has known carotid disease status post left carotid endarterectomy performed by Dr. Liliane BadeGreg Hayes May 12, 2004. We have been following his carotids by duplex ultrasound. Dr Allyson SabalBerry angiogram'd him May 07, 2011, revealing a 50%-60% ostial right internal carotid artery stenosis and his dopplers have remained stable. He is neurologically asymptomatic. His last dopplers were March 2015. He has stage 3 CRI and is followed by Dr Deterding. His renal arteries were "widley patent" at angiogram in 2008.He has had some symptoms consistent with claudication. LE dopplers done March 2015 showed ABIs of 1.0 and 0.9. He has 2+ distal pulses on exam. He has an abdominal bruit. His abd US in 2008 was negative for an AAA.   [name] saw Corine ShelterLuke Kilroy [facility] in [facility] office to [place] complaining of nocturnal palpitations..A 48   Hour Holter monitor showed sinus rhythm with occasional PVCs and runs of PSVT. He denies chest pain or shortness of breath.  Since I saw him 2 months ago he remained stable. His carotid Dopplers show no significant progression of disease in his lower extremity Dopplers didn't reveal high-grade disease in his iliac and SFAs. He complains of lifestyle limiting claudication. The plans are to perform angiography and potential intervention on him after the beginning of the year after holding his Lasix and losartan and it immediately him the day before for hydration.   Current Outpatient Prescriptions  Medication Sig Dispense Refill  . allopurinol (ZYLOPRIM) 300 MG tablet Take 300 mg  by mouth daily.  3  . amLODipine (NORVASC) 10 MG tablet Take 1 tablet by mouth daily.    Marland Kitchen. aspirin EC 81 MG tablet Take 81 mg by mouth every evening.    . calcitRIOL (ROCALTROL) 0.25 MCG capsule Take 0.25 mcg by mouth daily.     . cholecalciferol (VITAMIN D) 1000 UNITS tablet Take 1,000 Units by mouth daily.    . clopidogrel (PLAVIX) 75 MG tablet TAKE 1 TABLET BY MOUTH DAILY 30 tablet 4  . furosemide (LASIX) 80 MG tablet Take 40 mg by mouth 2 (two) times daily.     Marland Kitchen. losartan (COZAAR) 100 MG tablet Take 100 mg by mouth 2 (two) times daily.     . Multiple Vitamins-Minerals (PRESERVISION AREDS PO) Take 2 tablets by mouth daily.    . simvastatin (ZOCOR) 10 MG tablet TAKE 1 TABLET (10 MG TOTAL) BY MOUTH AT BEDTIME. 90 tablet 3  . sitaGLIPtin (JANUVIA) 100 MG tablet Take 100 mg by mouth daily.    Marland Kitchen. terazosin (HYTRIN) 10 MG capsule Take 1 capsule (10 mg total) by mouth at bedtime. 90 capsule 2  . TRADJENTA 5 MG TABS tablet Take 5 mg by mouth daily.     No current facility-administered medications for this visit.    No Known Allergies  History   Social History  . Marital Status: Widowed    Spouse Name: N/A    Number of Children: N/A  . Years of Education: N/A   Occupational History  . Not on file.   Social History Main Topics  . Smoking status: Former  Smoker -- 1.00 packs/day for 40 years  . Smokeless tobacco: Never Used     Comment: quit approx. 20 years ago.  . Alcohol Use: No  . Drug Use: Not on file  . Sexual Activity: Not on file   Other Topics Concern  . Not on file   Social History Narrative     Review of Systems: General: negative for chills, fever, night sweats or weight changes.  Cardiovascular: negative for chest pain, dyspnea on exertion, edema, orthopnea, palpitations, paroxysmal nocturnal dyspnea or shortness of breath Dermatological: negative for rash Respiratory: negative for cough or wheezing Urologic: negative for hematuria Abdominal: negative for nausea,  vomiting, diarrhea, bright red blood per rectum, melena, or hematemesis Neurologic: negative for visual changes, syncope, or dizziness All other systems reviewed and are otherwise negative except as noted above.    Blood pressure 180/46, pulse 64, height 5\' 6"  (1.676 m), weight 169 lb (76.658 kg).  General appearance: alert and no distress Neck: no adenopathy, no JVD, supple, symmetrical, trachea midline, thyroid not enlarged, symmetric, no tenderness/mass/nodules and bilateral carotid bruits right lateral left Lungs: clear to auscultation bilaterally Heart: regular rate and rhythm, S1, S2 normal, no murmur, click, rub or gallop Extremities: extremities normal, atraumatic, no cyanosis or edema  EKG normal sinus rhythm at 64 with evidence of LVH with repolarization changes. I personally reviewed this EKG  ASSESSMENT AND PLAN:   CAD minor CAD in '02. Myoview low risk 9/11 History of noncritical CAD by cardiac catheterization performed by Dr. Jenne CampusMcQueen December 2002. He had a negative Myoview 9/11, he denies chest pain or shortness of breath.  PVD LCE '05, known 60% RICA History of carotid artery disease status post left carotid endarterectomy performed by Dr. Liliane BadeGreg Hayes 05/08/04. I angiogramed  him 05/07/11 revealing a 50-60% ostial right internal carotid artery stenosis. His carotid Dopplers have been followed since and it remained stable. He is neurologically asymptomatic.  Hypertension History of hypertension with blood pressure measured today in the office at 180/46. He is on amlodipine 10 mg a day furosemide losartan 100.. The patient consider adding hydralazine in the future.  Peripheral arterial disease History of peripheral arterial disease status post lower extremity arterial Doppler studies performed in our office 06/30/14 revealing a right ABI of 0.88 with moderately high-frequency signal in the right common iliac artery and high-frequency signal in the mid right SFA. Left ABI is 0.71  with high-frequency signal in left external distal left SFA. He does complain of significant lifestyle limiting claudication. I'm going to plan to perform lower extremity angiography on him and potential intervention. We will need to hold his losartan and furosemide for several days and admit him and after hydration in order to minimize the potential for radiocontrast nephropathy.  Hypercholesteremia History of hyperlipidemia on simvastatin 10 mg a day with recent lipid profile performed 06/28/14 revealing a total cholesterol of 139, LDL of 75 and HDL of 41  Palpitations History of palpitations with recent 48-hour Holter monitor revealing primarily PVCs.      Runell GessJonathan J. Delshon Blanchfield MD FACP,FACC,FAHA, [facility] 09/22/2014 2:30 PM

## 2014-09-22 NOTE — Assessment & Plan Note (Signed)
History of palpitations with recent 48-hour Holter monitor revealing primarily PVCs.

## 2014-09-22 NOTE — Assessment & Plan Note (Signed)
History of carotid artery disease status post left carotid endarterectomy performed by Dr. Liliane BadeGreg Hayes 05/08/04. I angiogramed  him 05/07/11 revealing a 50-60% ostial right internal carotid artery stenosis. His carotid Dopplers have been followed since and it remained stable. He is neurologically asymptomatic.

## 2014-09-22 NOTE — Patient Instructions (Addendum)
Dr. Allyson SabalBerry has ordered a peripheral angiogram to be done [facility].  This procedure is going to look at the bloodflow in your lower extremities.  If Dr. Allyson SabalBerry is able to open up the arteries, you will have to spend one night in the hospital.  If he is not able to open the arteries, you will be able to go home that same day.    After the procedure, you will not be allowed to drive for 3 days or push, pull, or lift anything greater than 10 lbs for one week.     *REPS Minerva AreolaEric and Lorin PicketScott  *You will receive a phone call from [facility] the day before the procedure.  When they call you, please report to the hospital so that you can begin receiving IV fluids to prepare your kidneys for the upcoming catheterization.  If you have not received a phone call from the hospital by 2pm, please call our office and let us know.       Hold Cozaar and furosemide two days before the procedure.

## 2014-09-22 NOTE — Assessment & Plan Note (Signed)
History of noncritical CAD by cardiac catheterization performed by Dr. Jenne CampusMcQueen December 2002. He had a negative Myoview 9/11, he denies chest pain or shortness of breath.

## 2014-09-22 NOTE — Assessment & Plan Note (Signed)
History of hyperlipidemia on simvastatin 10 mg a day with recent lipid profile performed 06/28/14 revealing a total cholesterol of 139, LDL of 75 and HDL of 41

## 2014-09-22 NOTE — Assessment & Plan Note (Signed)
History of peripheral arterial disease status post lower extremity arterial Doppler studies performed in our office 06/30/14 revealing a right ABI of 0.88 with moderately high-frequency signal in the right common iliac artery and high-frequency signal in the mid right SFA. Left ABI is 0.71 with high-frequency signal in left external distal left SFA. He does complain of significant lifestyle limiting claudication. I'm going to plan to perform lower extremity angiography on him and potential intervention. We will need to hold his losartan and furosemide for several days and admit him and after hydration in order to minimize the potential for radiocontrast nephropathy.

## 2014-09-29 ENCOUNTER — Encounter (HOSPITAL_COMMUNITY)
Admission: RE | Admit: 2014-09-29 | Discharge: 2014-09-29 | Disposition: A | Discharge status: Home / Self Care | Payer: Medicare Other | Source: Ambulatory Visit | Attending: Nephrology | Admitting: Nephrology

## 2014-09-29 DIAGNOSIS — D631 Anemia in chronic kidney disease: Secondary | ICD-10-CM | POA: Insufficient documentation

## 2014-09-29 DIAGNOSIS — N183 Chronic kidney disease, stage 3 (moderate): Secondary | ICD-10-CM | POA: Insufficient documentation

## 2014-09-29 LAB — POCT HEMOGLOBIN-HEMACUE: Hemoglobin: 11.1 g/dL — ABNORMAL LOW (ref 13.0–17.0)

## 2014-09-29 LAB — FERRITIN: FERRITIN: 115 ng/mL (ref 22–322)

## 2014-09-29 LAB — IRON AND TIBC
IRON: 59 ug/dL (ref 42–135)
Saturation Ratios: 27 % (ref 20–55)
TIBC: 217 ug/dL (ref 215–435)
UIBC: 158 ug/dL (ref 125–400)

## 2014-09-29 MED ORDER — EPOETIN ALFA 40000 UNIT/ML IJ SOLN: 30000.0000 [IU] | INTRAMUSCULAR | Status: DC

## 2014-09-29 MED ORDER — EPOETIN ALFA 10000 UNIT/ML IJ SOLN
INTRAMUSCULAR | Status: AC
  Administered 2014-09-29: 10000 [IU] via SUBCUTANEOUS
  Filled 2014-09-29: qty 1

## 2014-09-29 MED ORDER — EPOETIN ALFA 20000 UNIT/ML IJ SOLN
INTRAMUSCULAR | Status: AC
  Administered 2014-09-29: 20000 [IU] via SUBCUTANEOUS
  Filled 2014-09-29: qty 1

## 2014-10-06 ENCOUNTER — Encounter (HOSPITAL_COMMUNITY)
Admission: RE | Admit: 2014-10-06 | Discharge: 2014-10-06 | Disposition: A | Discharge status: Home / Self Care | Payer: Medicare Other | Source: Ambulatory Visit | Attending: Nephrology | Admitting: Nephrology

## 2014-10-06 DIAGNOSIS — D631 Anemia in chronic kidney disease: Secondary | ICD-10-CM | POA: Diagnosis not present

## 2014-10-06 LAB — POCT HEMOGLOBIN-HEMACUE: HEMOGLOBIN: 11.2 g/dL — AB (ref 13.0–17.0)

## 2014-10-06 MED ORDER — EPOETIN ALFA 10000 UNIT/ML IJ SOLN
INTRAMUSCULAR | Status: AC
  Administered 2014-10-06: 10000 [IU] via SUBCUTANEOUS
  Filled 2014-10-06: qty 1

## 2014-10-06 MED ORDER — EPOETIN ALFA 20000 UNIT/ML IJ SOLN
INTRAMUSCULAR | Status: AC
  Administered 2014-10-06: 20000 [IU] via SUBCUTANEOUS
  Filled 2014-10-06: qty 1

## 2014-10-06 MED ORDER — EPOETIN ALFA 40000 UNIT/ML IJ SOLN: 30000.0000 [IU] | INTRAMUSCULAR | Status: DC

## 2014-10-12 ENCOUNTER — Ambulatory Visit: Payer: Medicare Other | Admitting: Physician Assistant

## 2014-10-13 ENCOUNTER — Encounter (HOSPITAL_COMMUNITY)
Admission: RE | Admit: 2014-10-13 | Discharge: 2014-10-13 | Disposition: A | Discharge status: Home / Self Care | Payer: Medicare Other | Source: Ambulatory Visit | Attending: Nephrology | Admitting: Nephrology

## 2014-10-13 DIAGNOSIS — D631 Anemia in chronic kidney disease: Secondary | ICD-10-CM | POA: Diagnosis not present

## 2014-10-13 LAB — POCT HEMOGLOBIN-HEMACUE: Hemoglobin: 11.8 g/dL — ABNORMAL LOW (ref 13.0–17.0)

## 2014-10-13 MED ORDER — EPOETIN ALFA 20000 UNIT/ML IJ SOLN
INTRAMUSCULAR | Status: AC
  Administered 2014-10-13: 20000 [IU] via SUBCUTANEOUS
  Filled 2014-10-13: qty 1

## 2014-10-13 MED ORDER — EPOETIN ALFA 40000 UNIT/ML IJ SOLN: 30000.0000 [IU] | INTRAMUSCULAR | Status: DC

## 2014-10-13 MED ORDER — EPOETIN ALFA 10000 UNIT/ML IJ SOLN
INTRAMUSCULAR | Status: AC
  Administered 2014-10-13: 10000 [IU] via SUBCUTANEOUS
  Filled 2014-10-13: qty 1

## 2014-10-20 ENCOUNTER — Encounter (HOSPITAL_COMMUNITY)
Admission: RE | Admit: 2014-10-20 | Discharge: 2014-10-20 | Disposition: A | Discharge status: Home / Self Care | Payer: Medicare Other | Source: Ambulatory Visit | Attending: Nephrology | Admitting: Nephrology

## 2014-10-20 DIAGNOSIS — D631 Anemia in chronic kidney disease: Secondary | ICD-10-CM | POA: Diagnosis not present

## 2014-10-20 LAB — POCT HEMOGLOBIN-HEMACUE: Hemoglobin: 11.8 g/dL — ABNORMAL LOW (ref 13.0–17.0)

## 2014-10-20 MED ORDER — EPOETIN ALFA 20000 UNIT/ML IJ SOLN
INTRAMUSCULAR | Status: AC
  Administered 2014-10-20: 20000 [IU] via SUBCUTANEOUS
  Filled 2014-10-20: qty 1

## 2014-10-20 MED ORDER — EPOETIN ALFA 40000 UNIT/ML IJ SOLN: 30000.0000 [IU] | INTRAMUSCULAR | Status: DC

## 2014-10-20 MED ORDER — EPOETIN ALFA 10000 UNIT/ML IJ SOLN
INTRAMUSCULAR | Status: AC
  Administered 2014-10-20: 10000 [IU] via SUBCUTANEOUS
  Filled 2014-10-20: qty 1

## 2014-10-27 ENCOUNTER — Encounter (HOSPITAL_COMMUNITY)
Admission: RE | Admit: 2014-10-27 | Discharge: 2014-10-27 | Disposition: A | Discharge status: Home / Self Care | Payer: Medicare Other | Source: Ambulatory Visit | Attending: Nephrology | Admitting: Nephrology

## 2014-10-27 DIAGNOSIS — D631 Anemia in chronic kidney disease: Secondary | ICD-10-CM | POA: Diagnosis not present

## 2014-10-27 LAB — IRON AND TIBC
IRON: 25 ug/dL — AB (ref 42–165)
Saturation Ratios: 12 % — ABNORMAL LOW (ref 20–55)
TIBC: 201 ug/dL — AB (ref 215–435)
UIBC: 176 ug/dL (ref 125–400)

## 2014-10-27 LAB — POCT HEMOGLOBIN-HEMACUE: Hemoglobin: 11.7 g/dL — ABNORMAL LOW (ref 13.0–17.0)

## 2014-10-27 MED ORDER — EPOETIN ALFA 20000 UNIT/ML IJ SOLN
INTRAMUSCULAR | Status: DC
Start: 2014-10-27 — End: 2014-10-28
  Filled 2014-10-27: qty 1

## 2014-10-27 MED ORDER — EPOETIN ALFA 10000 UNIT/ML IJ SOLN
INTRAMUSCULAR | Status: AC
  Filled 2014-10-27: qty 1

## 2014-10-27 MED ORDER — EPOETIN ALFA 40000 UNIT/ML IJ SOLN
30000.0000 [IU] | INTRAMUSCULAR | Status: DC
  Administered 2014-10-27: 30000 [IU] via SUBCUTANEOUS

## 2014-10-28 LAB — FERRITIN: FERRITIN: 22 ng/mL (ref 22–322)

## 2014-10-28 MED FILL — Epoetin Alfa Inj 20000 Unit/ML: INTRAMUSCULAR | Qty: 1 | Status: AC

## 2014-10-28 MED FILL — Epoetin Alfa Inj 10000 Unit/ML: INTRAMUSCULAR | Qty: 1 | Status: AC

## 2014-10-30 ENCOUNTER — Other Ambulatory Visit: Payer: Self-pay | Admitting: Cardiovascular Disease

## 2014-10-31 ENCOUNTER — Inpatient Hospital Stay (HOSPITAL_COMMUNITY)
Admission: RE | Admit: 2014-10-31 | Discharge: 2014-11-04 | DRG: 254 | Disposition: A | Discharge status: Home / Self Care | Payer: Medicare Other | Source: Ambulatory Visit | Attending: Cardiovascular Disease | Admitting: Cardiovascular Disease

## 2014-10-31 ENCOUNTER — Encounter (HOSPITAL_COMMUNITY): Payer: Self-pay

## 2014-10-31 DIAGNOSIS — I2510 Atherosclerotic heart disease of native coronary artery without angina pectoris: Secondary | ICD-10-CM | POA: Diagnosis present

## 2014-10-31 DIAGNOSIS — N183 Chronic kidney disease, stage 3 (moderate): Secondary | ICD-10-CM | POA: Diagnosis present

## 2014-10-31 DIAGNOSIS — I708 Atherosclerosis of other arteries: Secondary | ICD-10-CM | POA: Diagnosis present

## 2014-10-31 DIAGNOSIS — Z6829 Body mass index (BMI) 29.0-29.9, adult: Secondary | ICD-10-CM

## 2014-10-31 DIAGNOSIS — E785 Hyperlipidemia, unspecified: Secondary | ICD-10-CM | POA: Diagnosis present

## 2014-10-31 DIAGNOSIS — I6521 Occlusion and stenosis of right carotid artery: Secondary | ICD-10-CM | POA: Diagnosis present

## 2014-10-31 DIAGNOSIS — I129 Hypertensive chronic kidney disease with stage 1 through stage 4 chronic kidney disease, or unspecified chronic kidney disease: Secondary | ICD-10-CM | POA: Diagnosis present

## 2014-10-31 DIAGNOSIS — Z87891 Personal history of nicotine dependence: Secondary | ICD-10-CM

## 2014-10-31 DIAGNOSIS — I739 Peripheral vascular disease, unspecified: Principal | ICD-10-CM | POA: Diagnosis present

## 2014-10-31 DIAGNOSIS — E663 Overweight: Secondary | ICD-10-CM | POA: Diagnosis present

## 2014-10-31 DIAGNOSIS — E119 Type 2 diabetes mellitus without complications: Secondary | ICD-10-CM

## 2014-10-31 LAB — CREATININE, SERUM
Creatinine, Ser: 1.6 mg/dL — ABNORMAL HIGH (ref 0.50–1.35)
GFR calc Af Amer: 45 mL/min — ABNORMAL LOW (ref >90)
GFR, EST NON AFRICAN AMERICAN: 38 mL/min — AB (ref >90)

## 2014-10-31 LAB — CBC
HEMATOCRIT: 35.7 % — AB (ref 39.0–52.0)
HEMOGLOBIN: 11.2 g/dL — AB (ref 13.0–17.0)
MCH: 26.5 pg (ref 26.0–34.0)
MCHC: 31.4 g/dL (ref 30.0–36.0)
MCV: 84.6 fL (ref 78.0–100.0)
Platelets: 228 10*3/uL (ref 150–400)
RBC: 4.22 MIL/uL (ref 4.22–5.81)
RDW: 17.7 % — ABNORMAL HIGH (ref 11.5–15.5)
WBC: 6.2 10*3/uL (ref 4.0–10.5)

## 2014-10-31 MED ORDER — CLOPIDOGREL BISULFATE 75 MG PO TABS
75.0000 mg | ORAL_TABLET | Freq: Every day | ORAL | Status: DC
  Administered 2014-10-31 – 2014-11-04 (×5): 75 mg via ORAL
  Filled 2014-10-31 (×5): qty 1

## 2014-10-31 MED ORDER — ACETAMINOPHEN 325 MG PO TABS: 650.0000 mg | ORAL_TABLET | ORAL | Status: DC | PRN

## 2014-10-31 MED ORDER — HYDRALAZINE HCL 20 MG/ML IJ SOLN
10.0000 mg | INTRAMUSCULAR | Status: DC | PRN
  Administered 2014-10-31 – 2014-11-02 (×6): 10 mg via INTRAVENOUS
  Filled 2014-10-31 (×6): qty 1

## 2014-10-31 MED ORDER — SODIUM CHLORIDE 0.9 % IJ SOLN: 3.0000 mL | INTRAMUSCULAR | Status: DC | PRN

## 2014-10-31 MED ORDER — SODIUM CHLORIDE 0.9 % IV SOLN
INTRAVENOUS | Status: DC
  Administered 2014-11-01: 05:00:00 via INTRAVENOUS

## 2014-10-31 MED ORDER — NITROGLYCERIN 0.4 MG SL SUBL: 0.4000 mg | SUBLINGUAL_TABLET | SUBLINGUAL | Status: DC | PRN

## 2014-10-31 MED ORDER — VITAMIN D 1000 UNITS PO TABS
1000.0000 [IU] | ORAL_TABLET | Freq: Two times a day (BID) | ORAL | Status: DC
  Administered 2014-10-31 – 2014-11-04 (×7): 1000 [IU] via ORAL
  Filled 2014-10-31 (×11): qty 1

## 2014-10-31 MED ORDER — AMLODIPINE BESYLATE 10 MG PO TABS
10.0000 mg | ORAL_TABLET | Freq: Every day | ORAL | Status: DC
  Administered 2014-11-01 – 2014-11-04 (×4): 10 mg via ORAL
  Filled 2014-10-31 (×6): qty 1

## 2014-10-31 MED ORDER — SIMVASTATIN 10 MG PO TABS
10.0000 mg | ORAL_TABLET | Freq: Every day | ORAL | Status: DC
  Administered 2014-10-31 – 2014-11-04 (×5): 10 mg via ORAL
  Filled 2014-10-31 (×6): qty 1

## 2014-10-31 MED ORDER — TERAZOSIN HCL 5 MG PO CAPS
10.0000 mg | ORAL_CAPSULE | Freq: Every day | ORAL | Status: DC
Start: 2014-10-31 — End: 2014-11-04
  Administered 2014-10-31 – 2014-11-03 (×4): 10 mg via ORAL
  Filled 2014-10-31 (×6): qty 2

## 2014-10-31 MED ORDER — ONDANSETRON HCL 4 MG/2ML IJ SOLN: 4.0000 mg | Freq: Four times a day (QID) | INTRAMUSCULAR | Status: DC | PRN

## 2014-10-31 MED ORDER — ASPIRIN EC 81 MG PO TBEC
81.0000 mg | DELAYED_RELEASE_TABLET | Freq: Every evening | ORAL | Status: DC
  Administered 2014-10-31: 81 mg via ORAL
  Filled 2014-10-31 (×2): qty 1

## 2014-10-31 MED ORDER — CALCITRIOL 0.25 MCG PO CAPS
0.2500 ug | ORAL_CAPSULE | Freq: Every day | ORAL | Status: DC
  Administered 2014-11-01 – 2014-11-04 (×4): 0.25 ug via ORAL
  Filled 2014-10-31 (×6): qty 1

## 2014-10-31 MED ORDER — ALLOPURINOL 100 MG PO TABS
100.0000 mg | ORAL_TABLET | Freq: Every day | ORAL | Status: DC
  Administered 2014-10-31 – 2014-11-04 (×5): 100 mg via ORAL
  Filled 2014-10-31 (×6): qty 1

## 2014-10-31 MED ORDER — HEPARIN SODIUM (PORCINE) 5000 UNIT/ML IJ SOLN
5000.0000 [IU] | Freq: Three times a day (TID) | INTRAMUSCULAR | Status: DC
  Administered 2014-10-31 (×2): 5000 [IU] via SUBCUTANEOUS
  Filled 2014-10-31 (×4): qty 1

## 2014-10-31 MED ORDER — SODIUM CHLORIDE 0.9 % IV SOLN: 250.0000 mL | INTRAVENOUS | Status: DC | PRN

## 2014-10-31 MED ORDER — SODIUM CHLORIDE 0.9 % IJ SOLN
3.0000 mL | Freq: Two times a day (BID) | INTRAMUSCULAR | Status: DC
  Administered 2014-10-31: 3 mL via INTRAVENOUS

## 2014-10-31 NOTE — Progress Notes (Addendum)
Patient's nurse gave hydralazine 2300.   2330 BP 200/40. Dr. Tresa Endo notified.   No signs of distress and no c/o pain.   [name]  Notified Dr. Tresa Endo again at 1215 Order for labetalol 10 mg once.  Labetalol administered 0115.  Will continue to monitor.   [name]

## 2014-10-31 NOTE — Progress Notes (Signed)
       Vitals  BP        10/31/14 2133 10/31/14 2219 10/31/14 2220  Vitals  BP (!) 200/48 mmHg (!) 200/50 mmHg (!) 198/52 mmHg   BP taken manually with results above. Pt reports his BP "runs kind of high in the 180s (systolic) at home with my home device".   The patient's nurse gave night time meds which included  Hytrin. Nurse reassessed BP and found it to be 200/50 while patient sitting. Patient in no distress and has no complaints of pain.   Notified Dr. Tresa Endo. He gave orders for 10 mg of hydralazine IV.   Will continue to monitor closely.   [name]

## 2014-10-31 NOTE — H&P (Signed)
Admission History and Physical   10/31/2014 [name]   [birth year]  [id number]  Primary Physician Bryan Raider, MD Primary Cardiologist: Bryan Gess MD [name]   HPI:  The patient is an 81 year old, mildly overweight, widowed Caucasian male. He is a father of 2 and grandfather to 2 grandchildren. Dr. Mariel Duran saw him 08/23/14. He had noncritical CAD by cath performed by Dr. Lenise Duran December 2002, and had a low risk Myoview 9/11. He has known carotid disease status post left carotid endarterectomy performed by Dr. Liliane Duran May 12, 2004. We have been following his carotids by duplex ultrasound. Dr [name] angiogram'd him May 07, 2011, revealing a 50%-60% ostial right internal carotid artery stenosis and his dopplers have remained stable. He is neurologically asymptomatic. His last dopplers were March 2015. He has stage 3 CRI and is followed by Dr [name]. His renal arteries were "widley patent" at angiogram in 2008. He has had some symptoms consistent with claudication. LE dopplers done March 2015 showed ABIs of 1.0 and 0.9. He has 2+ distal pulses on exam. He has an abdominal bruit. His abd [place] in 2008 was negative for an AAA.   [name] saw [name] [facility] in the office complaining of nocturnal palpitations. A 48   Hour Holter monitor showed sinus rhythm with occasional PVCs and runs of PSVT. He denies chest pain or shortness of breath.  Since I saw him 2 months ago he remained stable. His carotid Dopplers show no significant progression of disease in his lower extremity Dopplers didn't reveal high-grade disease in his iliac and SFAs. He complains of lifestyle limiting claudication. The plans are to perform angiography and potential intervention on him after the beginning of the year after holding his Lasix and losartan and admit him the day before for hydration.   FHx: Father passed away before he was born Mother died at age 36, unknown PMH, generally  healthy.  No Known Allergies  History   Social History  . Marital Status: Widowed    Spouse Name: N/A    Number of Children: N/A  . Years of Education: N/A   Occupational History  . Not on file.   Social History Main Topics  . Smoking status: Former Smoker -- 1.00 packs/day for 40 years  . Smokeless tobacco: Never Used     Comment: quit approx. 20 years ago.  . Alcohol Use: No  . Drug Use: Not on file  . Sexual Activity: No   Other Topics Concern  . Not on file   Social History Narrative     Review of Systems: General: negative for chills, fever, night sweats or weight changes.  Cardiovascular: negative for chest pain, dyspnea on exertion, edema, orthopnea, palpitations, paroxysmal nocturnal dyspnea or shortness of breath Dermatological: negative for rash Respiratory: negative for cough or wheezing Urologic: negative for hematuria Abdominal: negative for nausea, vomiting, diarrhea, bright red blood per rectum, melena, or hematemesis Neurologic: negative for visual changes, syncope, or dizziness All other systems reviewed and are otherwise negative except as noted above.    Blood pressure 176/48, pulse 76, temperature 98 F (36.7 C), temperature source Oral, resp. rate 20, height  (1.676 m), weight 174 lb 9.7 oz (79.2 kg), SpO2 97 %.   General: Pleasant, NAD. Neuro: Alert and oriented X 3. Moves all extremities spontaneously. Psych: Normal affect. HEENT: Normal Neck: Supple without bruits or JVD. Lungs: Resp regular and unlabored, decreased breath sound bilaterally, no rale.  Heart: RRR  no s3, s4, or murmurs. Abdomen: Soft, non-tender, non-distended.  Extremities: No clubbing, cyanosis. DP/PT/Radials 2+ and equal bilaterally. 1+ edema in the LE  EKG normal sinus rhythm at 64 with evidence of LVH with repolarization changes. I personally reviewed this EKG    ASSESSMENT AND PLAN:   H&P unchanged from recent clinic visit by Dr. Allyson Duran.   Plan  for LE angiography for claudication symptom. Admit overnight for IV hydration prior to cath  Monitor for fluid overload given 1+ pitting edema and off lasix for 2 days, if O2 drop, will decrease IVF  [name] Bryan Course PA Pager: [phone]  Patient examined chart reviewed  Not clear why Dr [name] did not do HP and orders for elective admission for hydration.  Last Cr available in Epic is 2014 Cr 1.4  He has had normal ABI's but describes limiting claudication With history of vascular disease and Left CEA.  Being admitted for hydration pre angio  Have told nurse to start NS 100cc/ hr done at 12:00 so he should be ok for am  Check BMET in am  Exam remarkable for left carotid bruit SEM and elderly jovial male.  DP/PT hard to palpate at foot  [name]

## 2014-11-01 ENCOUNTER — Encounter (HOSPITAL_COMMUNITY)
Admission: RE | Disposition: A | Discharge status: Home / Self Care | Payer: Self-pay | Source: Ambulatory Visit | Attending: Cardiovascular Disease

## 2014-11-01 ENCOUNTER — Encounter (HOSPITAL_COMMUNITY): Payer: Self-pay | Admitting: Cardiovascular Disease

## 2014-11-01 ENCOUNTER — Encounter: Payer: Self-pay | Admitting: Cardiovascular Disease

## 2014-11-01 ENCOUNTER — Ambulatory Visit (HOSPITAL_COMMUNITY): Admission: RE | Admit: 2014-11-01 | Payer: Medicare Other | Source: Ambulatory Visit | Admitting: Cardiovascular Disease

## 2014-11-01 DIAGNOSIS — E663 Overweight: Secondary | ICD-10-CM | POA: Diagnosis present

## 2014-11-01 DIAGNOSIS — Z87891 Personal history of nicotine dependence: Secondary | ICD-10-CM | POA: Diagnosis not present

## 2014-11-01 DIAGNOSIS — I129 Hypertensive chronic kidney disease with stage 1 through stage 4 chronic kidney disease, or unspecified chronic kidney disease: Secondary | ICD-10-CM | POA: Diagnosis present

## 2014-11-01 DIAGNOSIS — I2510 Atherosclerotic heart disease of native coronary artery without angina pectoris: Secondary | ICD-10-CM | POA: Diagnosis present

## 2014-11-01 DIAGNOSIS — I6521 Occlusion and stenosis of right carotid artery: Secondary | ICD-10-CM | POA: Diagnosis present

## 2014-11-01 DIAGNOSIS — Z6829 Body mass index (BMI) 29.0-29.9, adult: Secondary | ICD-10-CM | POA: Diagnosis not present

## 2014-11-01 DIAGNOSIS — I739 Peripheral vascular disease, unspecified: Secondary | ICD-10-CM | POA: Diagnosis present

## 2014-11-01 DIAGNOSIS — I708 Atherosclerosis of other arteries: Secondary | ICD-10-CM | POA: Diagnosis present

## 2014-11-01 DIAGNOSIS — N183 Chronic kidney disease, stage 3 (moderate): Secondary | ICD-10-CM | POA: Diagnosis present

## 2014-11-01 DIAGNOSIS — I70212 Atherosclerosis of native arteries of extremities with intermittent claudication, left leg: Secondary | ICD-10-CM

## 2014-11-01 DIAGNOSIS — E785 Hyperlipidemia, unspecified: Secondary | ICD-10-CM | POA: Diagnosis present

## 2014-11-01 HISTORY — PX: ABDOMINAL ANGIOGRAM: SHX5499

## 2014-11-01 HISTORY — PX: LOWER EXTREMITY ANGIOGRAM: SHX5508

## 2014-11-01 LAB — GLUCOSE, CAPILLARY
Glucose-Capillary: 117 mg/dL — ABNORMAL HIGH (ref 70–99)
Glucose-Capillary: 119 mg/dL — ABNORMAL HIGH (ref 70–99)
Glucose-Capillary: 105 mg/dL — ABNORMAL HIGH (ref 70–99)

## 2014-11-01 LAB — BASIC METABOLIC PANEL
Anion gap: 4 — ABNORMAL LOW (ref 5–15)
BUN: 30 mg/dL — AB (ref 6–23)
CALCIUM: 8.1 mg/dL — AB (ref 8.4–10.5)
CO2: 28 mmol/L (ref 19–32)
Chloride: 110 mEq/L (ref 96–112)
Creatinine, Ser: 1.64 mg/dL — ABNORMAL HIGH (ref 0.50–1.35)
GFR calc Af Amer: 43 mL/min — ABNORMAL LOW (ref >90)
GFR, EST NON AFRICAN AMERICAN: 37 mL/min — AB (ref >90)
GLUCOSE: 98 mg/dL (ref 70–99)
Potassium: 3.5 mmol/L (ref 3.5–5.1)
Sodium: 142 mmol/L (ref 135–145)

## 2014-11-01 LAB — PROTIME-INR
INR: 1.07 (ref 0.00–1.49)
Prothrombin Time: 14.1 seconds (ref 11.6–15.2)

## 2014-11-01 LAB — POCT ACTIVATED CLOTTING TIME: ACTIVATED CLOTTING TIME: 140 s

## 2014-11-01 SURGERY — ANGIOGRAM, LOWER EXTREMITY
Anesthesia: LOCAL | Laterality: Left

## 2014-11-01 MED ORDER — LABETALOL HCL 5 MG/ML IV SOLN
10.0000 mg | INTRAVENOUS | Status: DC | PRN
  Administered 2014-11-01: 10 mg via INTRAVENOUS
  Filled 2014-11-01 (×3): qty 4

## 2014-11-01 MED ORDER — NITROGLYCERIN IN D5W 200-5 MCG/ML-% IV SOLN
INTRAVENOUS | Status: AC
  Administered 2014-11-01: 11:00:00 16.667 ug/min via INTRAVENOUS
  Filled 2014-11-01: qty 250

## 2014-11-01 MED ORDER — CLOPIDOGREL BISULFATE 75 MG PO TABS
75.0000 mg | ORAL_TABLET | Freq: Every day | ORAL | Status: DC
  Filled 2014-11-01: qty 1

## 2014-11-01 MED ORDER — HEPARIN (PORCINE) IN NACL 2-0.9 UNIT/ML-% IJ SOLN
INTRAMUSCULAR | Status: AC
  Filled 2014-11-01: qty 1000

## 2014-11-01 MED ORDER — LIDOCAINE HCL (PF) 1 % IJ SOLN
INTRAMUSCULAR | Status: AC
  Filled 2014-11-01: qty 30

## 2014-11-01 MED ORDER — INSULIN ASPART 100 UNIT/ML ~~LOC~~ SOLN
0.0000 [IU] | Freq: Three times a day (TID) | SUBCUTANEOUS | Status: DC
  Administered 2014-11-02: 2 [IU] via SUBCUTANEOUS
  Administered 2014-11-02: 19:00:00 3 [IU] via SUBCUTANEOUS
  Administered 2014-11-03: 2 [IU] via SUBCUTANEOUS

## 2014-11-01 MED ORDER — ASPIRIN EC 325 MG PO TBEC
325.0000 mg | DELAYED_RELEASE_TABLET | Freq: Every day | ORAL | Status: DC
  Administered 2014-11-01: 325 mg via ORAL
  Filled 2014-11-01: qty 1

## 2014-11-01 MED ORDER — FENTANYL CITRATE 0.05 MG/ML IJ SOLN
INTRAMUSCULAR | Status: AC
  Filled 2014-11-01: qty 2

## 2014-11-01 MED ORDER — HYDRALAZINE HCL 20 MG/ML IJ SOLN
INTRAMUSCULAR | Status: AC
  Filled 2014-11-01: qty 1

## 2014-11-01 MED ORDER — MIDAZOLAM HCL 2 MG/2ML IJ SOLN
INTRAMUSCULAR | Status: AC
  Filled 2014-11-01: qty 2

## 2014-11-01 MED ORDER — LABETALOL HCL 5 MG/ML IV SOLN
10.0000 mg | Freq: Once | INTRAVENOUS | Status: AC
  Administered 2014-11-01: 10 mg via INTRAVENOUS
  Filled 2014-11-01: qty 4

## 2014-11-01 MED ORDER — NITROGLYCERIN IN D5W 200-5 MCG/ML-% IV SOLN
INTRAVENOUS | Status: AC
Start: 2014-11-01 — End: 2014-11-01
  Filled 2014-11-01: qty 250

## 2014-11-01 MED ORDER — SODIUM CHLORIDE 0.9 % IV SOLN
INTRAVENOUS | Status: AC
  Administered 2014-11-01: 18:00:00 via INTRAVENOUS

## 2014-11-01 MED ORDER — NITROGLYCERIN IN D5W 200-5 MCG/ML-% IV SOLN
0.0000 ug/min | INTRAVENOUS | Status: DC
  Administered 2014-11-01: 12:00:00 35 ug/min via INTRAVENOUS
  Administered 2014-11-01: 45 ug/min via INTRAVENOUS
  Administered 2014-11-01: 15 ug/min via INTRAVENOUS
  Administered 2014-11-01: 60 ug/min via INTRAVENOUS
  Administered 2014-11-01: 12:00:00 25 ug/min via INTRAVENOUS
  Administered 2014-11-01: 55 ug/min via INTRAVENOUS
  Administered 2014-11-01: 17:00:00 40 ug/min via INTRAVENOUS
  Administered 2014-11-01: 12:00:00 30 ug/min via INTRAVENOUS
  Administered 2014-11-01: 16.667 ug/min via INTRAVENOUS
  Administered 2014-11-01: 50 ug/min via INTRAVENOUS
  Administered 2014-11-01: 18:00:00 30 ug/min via INTRAVENOUS
  Administered 2014-11-01: 40 ug/min via INTRAVENOUS

## 2014-11-01 MED ORDER — LINAGLIPTIN 5 MG PO TABS
5.0000 mg | ORAL_TABLET | Freq: Every day | ORAL | Status: DC
  Administered 2014-11-02 – 2014-11-04 (×3): 5 mg via ORAL
  Filled 2014-11-01 (×3): qty 1

## 2014-11-01 MED ORDER — HEPARIN SODIUM (PORCINE) 1000 UNIT/ML IJ SOLN
INTRAMUSCULAR | Status: AC
  Filled 2014-11-01: qty 1

## 2014-11-01 MED ORDER — ACETAMINOPHEN 325 MG PO TABS
650.0000 mg | ORAL_TABLET | ORAL | Status: DC | PRN
  Administered 2014-11-01: 650 mg via ORAL
  Filled 2014-11-01: qty 2

## 2014-11-01 MED ORDER — ONDANSETRON HCL 4 MG/2ML IJ SOLN: 4.0000 mg | Freq: Four times a day (QID) | INTRAMUSCULAR | Status: DC | PRN

## 2014-11-01 NOTE — Interval H&P Note (Signed)
History and Physical Interval Note:  11/01/2014 7:41 AM  [name]  has presented today for surgery, with the diagnosis of pvd  The various methods of treatment have been discussed with the patient and family. After consideration of risks, benefits and other options for treatment, the patient has consented to  Procedure(s): LOWER EXTREMITY ANGIOGRAM (N/A) as a surgical intervention .  The patient's history has been reviewed, patient examined, no change in status, stable for surgery.  I have reviewed the patient's chart and labs.  Questions were answered to the patient's satisfaction.     [name]

## 2014-11-01 NOTE — Progress Notes (Signed)
UR completed 

## 2014-11-01 NOTE — Telephone Encounter (Signed)
Rx(s) sent to pharmacy electronically.  

## 2014-11-01 NOTE — CV Procedure (Signed)
[name] is a 81 y.o. male    [id number] LOCATION:  FACILITY: [facility]  PHYSICIAN: Quay Burow, M.D. [birth year]   DATE OF PROCEDURE:  11/01/2014  DATE OF DISCHARGE:     PV Angiogram/Intervention    History obtained from chart review.The patient is an 81 year old, mildly overweight, widowed Caucasian male. He is a father of 2 and grandfather to 2 grandchildren.I last saw him 08/23/14. He had noncritical CAD by cath performed by Dr. Alla German December 2002, and had a low risk Myoview 9/11. He has known carotid disease status post left carotid endarterectomy performed by Dr. Drucie Opitz May 12, 2004. We have been following his carotids by duplex ultrasound. Dr Gwenlyn Found angiogram'd him May 07, 2011, revealing a 50%-60% ostial right internal carotid artery stenosis and his dopplers have remained stable. He is neurologically asymptomatic. His last dopplers were March 2015. He has stage 3 CRI and is followed by Dr Deterding. His renal arteries were "widley patent" at angiogram in 2008.He has had some symptoms consistent with claudication. LE dopplers done March 2015 showed ABIs of 1.0 and 0.9. He has 2+ distal pulses on exam. He has an abdominal bruit. His abd [place] in 2008 was negative for an AAA.   His carotid Dopplers show no significant progression and his lower extremity Dopplers reveal high-grade disease in his iliac and SFAs. He complains of lifestyle limiting claudication. The plans are to perform angiography and potential intervention on him after the beginning of the year after holding his Lasix and losartan and admit him the day before for hydration.   PROCEDURE DESCRIPTION:   The patient was brought to the second floor [facility] cath lab in the postabsorptive state. He was premedicated with Valium 5 mg by mouth, IV Versed and fentanyl. His right groinwas prepped and shaved in usual sterile fashion. Xylocaine 1% was used for local anesthesia. A 5 French sheath was inserted into  the right common femoral artery using standard Seldinger technique. A 5 French pigtail catheter was placed in the distal abdominal aorta. Distal abdominal aortography, bilateral iliac angiography and bifemoral runoff were performed using bolus chase digital subtraction step table technique. Visipaque dye was used for the entirety of the case. Retrograde aortic, left ventricular and pullback pressures were recorded. Contralateral access was then obtained with a crossover catheter and a pullback gradient was performed across the mid and proximal left SFA as well as the left external and common iliac arteries. There was no gradient noted in the SFA however there was a resting 40 mm meter of mercury radiant across the junction of the left common and external iliac artery which was hypodense on angiography.   HEMODYNAMICS:    AO SYSTOLIC/AO DIASTOLIC: 702/63   Angiographic Data:   1: Abdominal aortogram-the abdominal aortogram was free of aneurysmal or obstructive disease. There was a small ulcerated plaque on the left lateral distal abdominal aortic wall and no clinical significance  2: Left lower extremity-60% hypodense lesion in the distal left common iliac artery at the junction of the internal/external bifurcation. There was a focal 60% stenosis in the proximal third of the left SFA along with a moderately long calcified segmental mid left SFA in the 50% range with three-vessel runoff  3: Right lower extremity-there was a 40% proximal right common iliac artery stenosis with a less than 20 mm pullback gradient after administration of 200 g of intra-arterial nitroglycerin. The SideArm sheath. There was a 60% segmental proximal right SFA stenosis along with 80%  fairly focal calcified distal right SFA stenosis with three-vessel runoff. The proximal third of the right anterior tibial artery appeared to be diffusely diseased in the 75% range.  IMPRESSION:[name] has what appears to be a physiologically  significant distal left common iliac artery stenosis corresponding to an area of abnormality on duplex ultrasound. There was no pullback gradient across the left SFA area of disease. We will proceed with PTA and stenting of the distal left common iliac artery using self-expanding stent. The patient is already on dual antiplatelet therapy including aspirin and Plavix.  Procedure Description:the 5 French sheath was exchanged over the Wilson wire for a 6 [place] multipurpose destination sheath. Direct stenting was performed with a 10 mm x 4 cm long Cordis Smart nitinol self expanding stent which was postdilated with a 7 mm x 4 cm balloon up to 4 atm resulting in reduction of 60% hypodense distal left common iliac artery stenosis to 0% residual. The sheath was then withdrawn across the bifurcation and exchanged over the same wire for a short 6 French sheath. The patient received 20 mg of intravenous hydralazine at the end of the case for blood pressure control prior to sheath removal. He received a total of 7500 units of heparin during the case with an ending ACT of 214. Total contrast administered was 165 mL to the patient.  Final Impression: successful PCA and stenting of a physiologically significant distal left common iliac artery stenosis demonstrated with a pullback radiant of 40 mmHg using a 4 [place] end hole catheter. This was stented with a 10 mm x 4 cm like to also expanding stent. The left SFA disease did not appear to be physiologically significant. The patient does have disease in his right SFA and anterior tibial artery. The sheath will be removed once the ACT falls below 170 pressure held. The patient's serum creatinine was 1.6 this morning the creatinine clearance of 40 mL/m. He'll be hydrated overnight and discharged home in the morning on dual antibiotic therapy. We will get lower extremity are chilled Doppler studies in our [place] on office in one week. He'll be seen back in 2-3 weeks for  clinical follow-up as an outpatient.    Lorretta Harp MD, [facility] 11/01/2014 9:00 AM

## 2014-11-01 NOTE — Progress Notes (Signed)
Patient at cath lab earlier  Underwnt intervention to L common iliac artery.  Hydrate.  Labs in AM Strict I/O.

## 2014-11-01 NOTE — Progress Notes (Signed)
Site area: right groin  Site Prior to Removal:  Level 0  Pressure Applied For 20 MINUTES    Minutes Beginning at 1410  Manual:   Yes.    Patient Status During Pull:  stable  Post Pull Groin Site:  Level 0  Post Pull Instructions Given:  Yes.    Post Pull Pulses Present:  Yes.    Dressing Applied:  Yes.    Comments:  Dressing dry and intact, rechecked site at 1445 and 1500 with no change in assessment. No swelling, hematoma, bleeding and bruising.

## 2014-11-02 DIAGNOSIS — I10 Essential (primary) hypertension: Secondary | ICD-10-CM

## 2014-11-02 DIAGNOSIS — N179 Acute kidney failure, unspecified: Secondary | ICD-10-CM

## 2014-11-02 DIAGNOSIS — Z889 Allergy status to unspecified drugs, medicaments and biological substances status: Secondary | ICD-10-CM

## 2014-11-02 LAB — BASIC METABOLIC PANEL
Anion gap: 9 (ref 5–15)
BUN: 32 mg/dL — ABNORMAL HIGH (ref 6–23)
CO2: 22 mmol/L (ref 19–32)
Calcium: 8.1 mg/dL — ABNORMAL LOW (ref 8.4–10.5)
Chloride: 110 mEq/L (ref 96–112)
Creatinine, Ser: 1.83 mg/dL — ABNORMAL HIGH (ref 0.50–1.35)
GFR calc non Af Amer: 33 mL/min — ABNORMAL LOW (ref >90)
GFR, EST AFRICAN AMERICAN: 38 mL/min — AB (ref >90)
Glucose, Bld: 109 mg/dL — ABNORMAL HIGH (ref 70–99)
POTASSIUM: 3.7 mmol/L (ref 3.5–5.1)
Sodium: 141 mmol/L (ref 135–145)

## 2014-11-02 LAB — CBC
HCT: 34.4 % — ABNORMAL LOW (ref 39.0–52.0)
HEMOGLOBIN: 10.7 g/dL — AB (ref 13.0–17.0)
MCH: 26.3 pg (ref 26.0–34.0)
MCHC: 31.1 g/dL (ref 30.0–36.0)
MCV: 84.5 fL (ref 78.0–100.0)
Platelets: 239 10*3/uL (ref 150–400)
RBC: 4.07 MIL/uL — AB (ref 4.22–5.81)
RDW: 18.5 % — AB (ref 11.5–15.5)
WBC: 8.1 10*3/uL (ref 4.0–10.5)

## 2014-11-02 LAB — GLUCOSE, CAPILLARY
GLUCOSE-CAPILLARY: 217 mg/dL — AB (ref 70–99)
Glucose-Capillary: 76 mg/dL (ref 70–99)
Glucose-Capillary: 199 mg/dL — ABNORMAL HIGH (ref 70–99)
Glucose-Capillary: 187 mg/dL — ABNORMAL HIGH (ref 70–99)

## 2014-11-02 MED ORDER — DIPHENHYDRAMINE HCL 50 MG/ML IJ SOLN
INTRAMUSCULAR | Status: AC
  Filled 2014-11-02: qty 1

## 2014-11-02 MED ORDER — CLONIDINE HCL 0.1 MG PO TABS
0.1000 mg | ORAL_TABLET | Freq: Once | ORAL | Status: DC
  Filled 2014-11-02: qty 1

## 2014-11-02 MED ORDER — METHYLPREDNISOLONE SODIUM SUCC 125 MG IJ SOLR
80.0000 mg | Freq: Once | INTRAMUSCULAR | Status: AC
  Administered 2014-11-02: 04:00:00 80 mg via INTRAVENOUS
  Filled 2014-11-02: qty 1.28

## 2014-11-02 MED ORDER — HYDRALAZINE HCL 25 MG PO TABS
25.0000 mg | ORAL_TABLET | Freq: Three times a day (TID) | ORAL | Status: DC
  Administered 2014-11-02: 25 mg via ORAL
  Filled 2014-11-02 (×4): qty 1

## 2014-11-02 MED ORDER — DIPHENHYDRAMINE HCL 50 MG/ML IJ SOLN
25.0000 mg | Freq: Once | INTRAMUSCULAR | Status: AC
Start: 2014-11-02 — End: 2014-11-02
  Administered 2014-11-02: 03:00:00 25 mg via INTRAVENOUS

## 2014-11-02 MED ORDER — HYDRALAZINE HCL 50 MG PO TABS
50.0000 mg | ORAL_TABLET | Freq: Three times a day (TID) | ORAL | Status: DC
  Administered 2014-11-02 – 2014-11-04 (×5): 50 mg via ORAL
  Filled 2014-11-02 (×8): qty 1

## 2014-11-02 MED ORDER — ASPIRIN EC 81 MG PO TBEC
81.0000 mg | DELAYED_RELEASE_TABLET | Freq: Every day | ORAL | Status: DC
  Administered 2014-11-02 – 2014-11-04 (×3): 81 mg via ORAL
  Filled 2014-11-02 (×3): qty 1

## 2014-11-02 NOTE — Progress Notes (Signed)
Notified MD that patient has swelling to face only newest thing given recently was labetalol others meds was given very long ago. No signs of respiratory distress I will monitor. Received order foe benadryl, and solumedrol. Daughter at bedside and she also thinks his face is swollen around eyes and bottom lip. I explained to patient to call for any changes he feels especially breathing. I will contnue to monitor.

## 2014-11-02 NOTE — Progress Notes (Addendum)
Patient: [name] / Admit Date: 10/31/2014 / Date of Encounter: 11/02/2014, 7:00 AM   Subjective: Received IV labetalol for ??BP last night (runs high as outpatient) but developed allergic rxn with swelling in the face. Subsequently received benadryl and solumedrol with some improvement in lip/face swelling. Feels well this AM without further complaints.   Objective: Telemetry: NSR at 730pm brief run of atrial tach/SVT with occasional PACs and brief atrial bigeminy Physical Exam: Blood pressure 157/48, pulse 78, temperature 97.4 F (36.3 C), temperature source Oral, resp. rate 18, height 5\' 6"  (1.676 m), weight 175 lb 14.8 oz (79.8 kg), SpO2 94 %. General: Well developed, well nourished WM in no acute distress. Head: Normocephalic, atraumatic, sclera non-icteric, no xanthomas, nares are without discharge. Minimal facial edema although difficult to know patient's baseline facial features. Neck: JVP not elevated. Lungs: Clear bilaterally to auscultation without wheezes, rales, or rhonchi. Breathing is unlabored. Heart: RRR S1 S2 without murmurs, rubs, or gallops.  Abdomen: Soft, non-tender, non-distended with normoactive bowel sounds. No rebound/guarding. Extremities: No clubbing or cyanosis. No edema. Distal pedal pulses are 2+ and equal bilaterally. RLE groin without hematoma, ecchoymosis. He has bilateral femoral bruits. Neuro: Alert and oriented X 3. Moves all extremities spontaneously. Psych:  Responds to questions appropriately with a normal affect.   Intake/Output Summary (Last 24 hours) at 11/02/14 0700 Last data filed at 11/02/14 [id number]  Gross per 24 hour  Intake 837.63 ml  Output    200 ml  Net 637.63 ml    Inpatient Medications:  . allopurinol  100 mg Oral Daily  . amLODipine  10 mg Oral Daily  . aspirin EC  325 mg Oral Daily  . calcitRIOL  0.25 mcg Oral Daily  . cholecalciferol  1,000 Units Oral BID  . cloNIDine  0.1 mg Oral Once  . clopidogrel  75 mg Oral Daily  .  clopidogrel  75 mg Oral Q breakfast  . insulin aspart  0-9 Units Subcutaneous TID WC  . linagliptin  5 mg Oral Daily  . simvastatin  10 mg Oral q1800  . terazosin  10 mg Oral QHS   Infusions:  . nitroGLYCERIN 30 mcg/min (11/01/14 1800)    Labs:  Recent Labs  11/01/14 0429 11/02/14 0322  NA 142 141  K 3.5 3.7  CL 110 110  CO2 28 22  GLUCOSE 98 109*  BUN 30* 32*  CREATININE 1.64* 1.83*  CALCIUM 8.1* 8.1*   No results for input(s): AST, ALT, ALKPHOS, BILITOT, PROT, ALBUMIN in the last 72 hours.  Recent Labs  10/31/14 1435 11/02/14 0322  WBC 6.2 8.1  HGB 11.2* 10.7*  HCT 35.7* 34.4*  MCV 84.6 84.5  PLT 228 239   Radiology/Studies:  No results found.   Carotid duplex 06/2014: known occluded vertebral, right bulb/prox ICA with mod-severe amount of fibrous plaque with increased velocities c/w >70% reduction, bilateral subclavian artery 50-69% diameter reduction, left CEA with mildly elevated velocities throughout without hemodynamically significant stenosis.   Assessment and Plan  1. PAD - s/p PCA and stenting of distal LCIA stenosis (residual left SFA disease did not appear to be physiologically significant, some disease in right SFA and anterior tibial artery) 2. CKD stage III 3. Accelerated HTN 4. Allergic reaction felt 2/2 to labetalol 5. PSVT/PVCs by 48 hour event monitor 05/2014 6. Carotid artery stenosis (L CEA 2005, duplex 06/2014 with progression of disease with plans to repeat 6 months) 7. Noncritical CAD in 2002 (low risk nuc 06/2010) 8. Mild anemia,  possibly r/t chronic disease  From PAD standpoint, plan is to continue DAPT on discharge with outpatient LE duplex in 1 week with f/u 2-3 weeks with Dr. Allyson Sabal. However, given allergic reaction and elevation in post-angio creatinine, will observe for today and recheck labs in AM. He was on Lasix  BID and Micardis  prior to admission but these have remained on hold. NTG gtt was titrated to off overnight. Will  start oral hydralazine  TID to start in addition to his amlodipine  daily and follow for gradual BP reduction. Will titrate as needed. Decrease ASA to . Add DVT ppx to start this afternoon.  Signed, Ronie Spies PA-C Patient seen and examined and history reviewed. Agree with above findings and plan. Patient had an allergic reaction to medication last pm- probably IV labetalol. Renal function is worse post contrast. BP is still elevated. I have recommended watching today in hospital. Will reassess renal function in am. Hold Micardis and lasix for now. Start hydralazine for BP control. Right groin is without hematoma.   Parilee Hally [place], MDFACC 11/02/2014 12:18 PM

## 2014-11-03 ENCOUNTER — Encounter (HOSPITAL_COMMUNITY): Payer: Medicare Other

## 2014-11-03 ENCOUNTER — Inpatient Hospital Stay (HOSPITAL_COMMUNITY): Admit: 2014-11-03 | Payer: Medicare Other

## 2014-11-03 DIAGNOSIS — N183 Chronic kidney disease, stage 3 (moderate): Secondary | ICD-10-CM

## 2014-11-03 LAB — BASIC METABOLIC PANEL
Anion gap: 6 (ref 5–15)
Anion gap: 6 (ref 5–15)
BUN: 52 mg/dL — ABNORMAL HIGH (ref 6–23)
BUN: 58 mg/dL — ABNORMAL HIGH (ref 6–23)
CALCIUM: 8.4 mg/dL (ref 8.4–10.5)
CHLORIDE: 108 meq/L (ref 96–112)
CHLORIDE: 105 meq/L (ref 96–112)
CO2: 22 mmol/L (ref 19–32)
CO2: 24 mmol/L (ref 19–32)
Calcium: 8.5 mg/dL (ref 8.4–10.5)
Creatinine, Ser: 2.74 mg/dL — ABNORMAL HIGH (ref 0.50–1.35)
Creatinine, Ser: 2.98 mg/dL — ABNORMAL HIGH (ref 0.50–1.35)
GFR calc Af Amer: 23 mL/min — ABNORMAL LOW (ref >90)
GFR calc non Af Amer: 20 mL/min — ABNORMAL LOW (ref >90)
GFR calc non Af Amer: 18 mL/min — ABNORMAL LOW (ref >90)
GFR, EST AFRICAN AMERICAN: 21 mL/min — AB (ref >90)
GLUCOSE: 138 mg/dL — AB (ref 70–99)
Glucose, Bld: 138 mg/dL — ABNORMAL HIGH (ref 70–99)
POTASSIUM: 4.7 mmol/L (ref 3.5–5.1)
Potassium: 4.8 mmol/L (ref 3.5–5.1)
SODIUM: 136 mmol/L (ref 135–145)
SODIUM: 135 mmol/L (ref 135–145)

## 2014-11-03 LAB — POCT ACTIVATED CLOTTING TIME
Activated Clotting Time: 202 seconds
Activated Clotting Time: 214 seconds

## 2014-11-03 LAB — CBC
HCT: 35 % — ABNORMAL LOW (ref 39.0–52.0)
Hemoglobin: 11.3 g/dL — ABNORMAL LOW (ref 13.0–17.0)
MCH: 27.6 pg (ref 26.0–34.0)
MCHC: 32.3 g/dL (ref 30.0–36.0)
MCV: 85.4 fL (ref 78.0–100.0)
PLATELETS: 203 10*3/uL (ref 150–400)
RBC: 4.1 MIL/uL — ABNORMAL LOW (ref 4.22–5.81)
RDW: 18.7 % — AB (ref 11.5–15.5)
WBC: 12.6 10*3/uL — ABNORMAL HIGH (ref 4.0–10.5)

## 2014-11-03 LAB — GLUCOSE, CAPILLARY
GLUCOSE-CAPILLARY: 123 mg/dL — AB (ref 70–99)
Glucose-Capillary: 152 mg/dL — ABNORMAL HIGH (ref 70–99)
Glucose-Capillary: 109 mg/dL — ABNORMAL HIGH (ref 70–99)

## 2014-11-03 MED ORDER — SODIUM CHLORIDE 0.9 % IV SOLN
INTRAVENOUS | Status: DC
  Administered 2014-11-03 (×2): via INTRAVENOUS

## 2014-11-03 MED ORDER — HEPARIN SODIUM (PORCINE) 5000 UNIT/ML IJ SOLN
5000.0000 [IU] | Freq: Three times a day (TID) | INTRAMUSCULAR | Status: DC
  Administered 2014-11-03 – 2014-11-04 (×3): 5000 [IU] via SUBCUTANEOUS
  Filled 2014-11-03 (×3): qty 1

## 2014-11-03 MED ORDER — CLONIDINE HCL 0.1 MG PO TABS: 0.1000 mg | ORAL_TABLET | Freq: Three times a day (TID) | ORAL | Status: DC | PRN

## 2014-11-03 MED ORDER — CLONIDINE HCL 0.1 MG PO TABS
0.1000 mg | ORAL_TABLET | Freq: Three times a day (TID) | ORAL | Status: DC
  Administered 2014-11-03 – 2014-11-04 (×4): 0.1 mg via ORAL
  Filled 2014-11-03 (×7): qty 1

## 2014-11-03 NOTE — Progress Notes (Signed)
Patient Name: [name] Date of Encounter: 11/03/2014     Active Problems:   Claudication   Claudication in peripheral vascular disease    SUBJECTIVE  Feeling well. Ready to go home. Says his BP always runs high.   CURRENT MEDS . allopurinol  100 mg Oral Daily  . amLODipine  10 mg Oral Daily  . aspirin EC  81 mg Oral Daily  . calcitRIOL  0.25 mcg Oral Daily  . cholecalciferol  1,000 Units Oral BID  . clopidogrel  75 mg Oral Daily  . hydrALAZINE  50 mg Oral 3 times per day  . insulin aspart  0-9 Units Subcutaneous TID WC  . linagliptin  5 mg Oral Daily  . simvastatin  10 mg Oral q1800  . terazosin  10 mg Oral QHS    OBJECTIVE  Filed Vitals:   11/02/14 1830 11/02/14 2102 11/03/14 0021 11/03/14 0549  BP: 180/41 213/43 207/40 197/39  Pulse:  83 87 85  Temp:  97.5 F (36.4 C) 97.9 F (36.6 C) 97.7 F (36.5 C)  TempSrc:  Oral Oral Oral  Resp:  Height:      Weight:   176 lb 9.4 oz (80.1 kg)   SpO2:  96% 95% 96%    Intake/Output Summary (Last 24 hours) at 11/03/14 0739 Last data filed at 11/03/14 0554  Gross per 24 hour  Intake    420 ml  Output    775 ml  Net   -355 ml   Filed Weights   10/31/14 1145 11/02/14 0100 11/03/14 0021  Weight: 174 lb 9.7 oz (79.2 kg) 175 lb 14.8 oz (79.8 kg) 176 lb 9.4 oz (80.1 kg)    PHYSICAL EXAM  General: Pleasant, NAD. Obese. Ruddy face but no swelling according to wife Neuro: Alert and oriented X 3. Moves all extremities spontaneously. Psych: Normal affect. HEENT:  Normal  Neck: Supple without bruits or JVD. Lungs:  Resp regular and unlabored, CTA. Heart: RRR no s3, s4, or murmurs. Abdomen: Soft, non-tender, non-distended, BS + x 4.  Extremities: No clubbing, cyanosis or edema. DP/PT/Radials 2+ and equal bilaterally. RLE groin without hematoma, ecchoymosis. He has bilateral femoral bruits.  Accessory Clinical Findings  CBC  Recent Labs  11/02/14 0322 11/03/14 0340  WBC 8.1 12.6*  HGB 10.7* 11.3*  HCT  34.4* 35.0*  MCV 84.5 85.4  PLT 239 203   Basic Metabolic Panel  Recent Labs  11/02/14 0322 11/03/14 0340  NA 141 136  K 3.7 4.7  CL 110 108  CO2 22 22  GLUCOSE 109* 138*  BUN 32* 52*  CREATININE 1.83* 2.74*  CALCIUM 8.1* 8.5    TELE  NSR with some PVCs. 3 beat run of NSVT  Radiology/Studies  No results found.  ASSESSMENT AND PLAN  [name] is a 81 y.o. male with a history of non critical CAD, HTN, PAD, carotid artery disease s/p L CEA (2005), CKD, PVCs/PVST and HLD who presented to [facility] on 10/31/13 for pre hydration prior to peripheral angiogram by Dr. Allyson Sabal for lifestyle limiting claudication.   PAD - s/p PCA and stenting of distal LCIA stenosis (residual left SFA disease did not appear to be physiologically significant, some disease in right SFA and anterior tibial artery) -- Continue DAPT on discharge with outpatient LE duplex in 1 week with f/u 2-3 weeks with Dr. Allyson Sabal.   CKD stage III- followed by Dr. Arlyn Leak.  -- Renal function is worse post contrast. 1.64-->  1.83--> 2.74 today. --  Holding Micardis and lasix for now. Will start IVF at 77 and repeat BMET this afternoon. If this does not start to go down would contact renal    Accelerated HTN- Given IV labetelol for elevated BP but developed allergic rxn with swelling in the face. Subsequently received benadryl and solumedrol, now with complete resolution -- Started on oral hydralazine 50mg  TID to start in addition to his amlodipine 10mg  daily and BP still not well controlled. Last pressure 207/6540mm Hg. Patient says his BP always runs high at home.  -- Will add clonidine 0.1 mg TID.   PSVT/PVCs by 48 hour event monitor 05/2014  Carotid artery stenosis (L CEA 2005, duplex 06/2014 with progression of disease with plans to repeat 6 months)  Noncritical CAD in 2002 (low risk nuc 06/2010)  Mild anemia, possibly r/t chronic disease    Signed, Janetta HoraHOMPSON, KATHRYN R PA-C  Pager [phone]  Patinet seen and examined   I have amended note to reflect my findings. Patinet denies SOB  Lungs CTA  Card:  RRR  Ext  Triv edema Labs signif for Cr 2.74  Signif bump from yesterday. Reviewed with [name]  WOuld recomm patinet stay in hosp for hydration and close follow up of renal function.  Patient is agreeable  Repeat BMET later today.   Add clonidine for BP    [name]

## 2014-11-04 ENCOUNTER — Other Ambulatory Visit: Payer: Self-pay | Admitting: Physician Assistant

## 2014-11-04 DIAGNOSIS — N183 Chronic kidney disease, stage 3 unspecified: Secondary | ICD-10-CM

## 2014-11-04 DIAGNOSIS — I739 Peripheral vascular disease, unspecified: Secondary | ICD-10-CM

## 2014-11-04 LAB — BASIC METABOLIC PANEL
ANION GAP: 4 — AB (ref 5–15)
Anion gap: 6 (ref 5–15)
BUN: 64 mg/dL — AB (ref 6–23)
BUN: 66 mg/dL — ABNORMAL HIGH (ref 6–23)
CALCIUM: 7.8 mg/dL — AB (ref 8.4–10.5)
CHLORIDE: 107 meq/L (ref 96–112)
CO2: 23 mmol/L (ref 19–32)
CO2: 22 mmol/L (ref 19–32)
CREATININE: 3.17 mg/dL — AB (ref 0.50–1.35)
CREATININE: 3.09 mg/dL — AB (ref 0.50–1.35)
Calcium: 8.2 mg/dL — ABNORMAL LOW (ref 8.4–10.5)
Chloride: 109 mEq/L (ref 96–112)
GFR calc Af Amer: 20 mL/min — ABNORMAL LOW (ref >90)
GFR calc non Af Amer: 17 mL/min — ABNORMAL LOW (ref >90)
GFR calc non Af Amer: 17 mL/min — ABNORMAL LOW (ref >90)
GFR, EST AFRICAN AMERICAN: 20 mL/min — AB (ref >90)
Glucose, Bld: 152 mg/dL — ABNORMAL HIGH (ref 70–99)
Glucose, Bld: 116 mg/dL — ABNORMAL HIGH (ref 70–99)
Potassium: 4.8 mmol/L (ref 3.5–5.1)
Potassium: 4.8 mmol/L (ref 3.5–5.1)
SODIUM: 136 mmol/L (ref 135–145)
Sodium: 135 mmol/L (ref 135–145)

## 2014-11-04 LAB — GLUCOSE, CAPILLARY
GLUCOSE-CAPILLARY: 103 mg/dL — AB (ref 70–99)
Glucose-Capillary: 87 mg/dL (ref 70–99)

## 2014-11-04 MED ORDER — ALLOPURINOL 100 MG PO TABS: 100.0000 mg | ORAL_TABLET | Freq: Every day | ORAL | Status: DC

## 2014-11-04 MED ORDER — FUROSEMIDE 80 MG PO TABS: 80.0000 mg | ORAL_TABLET | Freq: Two times a day (BID) | ORAL | Status: DC

## 2014-11-04 MED ORDER — FUROSEMIDE 80 MG PO TABS: 80.0000 mg | ORAL_TABLET | Freq: Every day | ORAL | Status: DC

## 2014-11-04 MED ORDER — CLONIDINE HCL 0.1 MG PO TABS: 0.1000 mg | ORAL_TABLET | Freq: Three times a day (TID) | ORAL | Status: DC

## 2014-11-04 MED ORDER — HYDRALAZINE HCL 50 MG PO TABS: 50.0000 mg | ORAL_TABLET | Freq: Three times a day (TID) | ORAL | Status: DC

## 2014-11-04 NOTE — Discharge Instructions (Signed)
° °  Groin Site Care Refer to this sheet in the next few weeks. These instructions provide you with information on caring for yourself after your procedure. Your caregiver may also give you more specific instructions. Your treatment has been planned according to current medical practices, but problems sometimes occur. Call your caregiver if you have any problems or questions after your procedure. HOME CARE INSTRUCTIONS  You may shower the day after the procedure.Remove the bandage (dressing) and gently wash the site with plain soap and water.Gently pat the site dry.  Do not apply powder or lotion to the site.  Do not submerge the affected site in water for 3 to 5 days.  Inspect the site at least twice daily.  Do not flex or bend the affected arm for 24 hours.  No lifting over 5 pounds (2.3 kg) for 5 days after your procedure.  Do not drive home if you are discharged the same day of the procedure. Have someone else drive you.  You may drive 24 hours after the procedure unless otherwise instructed by your caregiver.  Do not operate machinery or power tools for 24 hours.  A responsible adult should be with you for the first 24 hours after you arrive home. What to expect:  Any bruising will usually fade within 1 to 2 weeks.  Blood that collects in the tissue (hematoma) may be painful to the touch. It should usually decrease in size and tenderness within 1 to 2 weeks. SEEK IMMEDIATE MEDICAL CARE IF:  You have unusual pain at the radial site.  You have redness, warmth, swelling, or pain at the radial site.  You have drainage (other than a small amount of blood on the dressing).  You have chills.  You have a fever or persistent symptoms for more than 72 hours.  You have a fever and your symptoms suddenly get worse.  Your arm becomes pale, cool, tingly, or numb.  You have heavy bleeding from the site. Hold pressure on the site. Document Released: 11/17/2010 Document Revised:  01/07/2012 Document Reviewed: 11/17/2010 [facility] Patient Information 2015 [place]. This information is not intended to replace advice given to you by your health care provider. Make sure you discuss any questions you have with your health care provider.

## 2014-11-04 NOTE — Discharge Summary (Signed)
CARDIOLOGY DISCHARGE SUMMARY   Patient ID: [name] MRN: [id number] DOB/AGE: 01-Jul-1930 81 y.o.  Admit date: 10/31/2014 Discharge date: 11/08/2014  PCP: Mayra Neer, MD Primary Cardiologist: Dr. Gwenlyn Found  Primary Discharge Diagnosis:  Claudication and peripheral vascular disease - s/p successful PTA and stenting of a physiologically significant distal left common iliac artery stenosis   Secondary Discharge Diagnosis:  Chronic renal insufficiency  Procedures: Abdominal aortogram, left lower extremity angiogram, right upper extremity angiogram, PTCA and stenting of the distal left common iliac artery  Hospital Course: [name] is a 81 y.o. male with no history of CAD. He had a low risk Myoview in the past. He had moderate carotid artery disease, followed by Dopplers. He has chronic renal insufficiency, stage III with no significant renal artery stenosis. He was evaluated by Dr. Gwenlyn Found for claudication and lower extremity Dopplers revealed high-grade disease. He was scheduled for peripheral angiogram and came to the hospital for the procedure on 11/01/2014.  Procedure results are below. He had successful intervention to his distal left common iliac artery and tolerated the procedure well.   On 01/05, he was seen by Dr. [place] and all data were reviewed. His BUN and creatinine had gone from 30/1.64 prior to the procedure up to 52/2.74. He was hydrated and held overnight. Over the next 48 hours, his creatinine continued to climb. The peak was 64/3.17. He was otherwise doing well, with no significant hematoma at the cath site.  On 01/07, he was seen by Dr. Dorris Carnes and all data were reviewed. It was felt that his BUN and creatinine had peaked and were starting to trend down. He was ambulating well and his claudication symptoms were greatly improved. No further inpatient workup was indicated and he is considered stable for discharge, to follow up as an outpatient. He is to continue on  dual antiplatelet therapy.   Labs:   Lab Results  Component Value Date   WBC 12.6* 11/03/2014   HGB 11.3* 11/03/2014   HCT 35.0* 11/03/2014   MCV 85.4 11/03/2014   PLT 203 11/03/2014     Recent Labs Lab 11/04/14 1400  NA 135  K 4.8  CL 109  CO2 22  BUN 66*  CREATININE 3.09*  CALCIUM 7.8*  GLUCOSE 116*    Peripheral vascular Cath: 11/01/2014 Angiographic Data:  1: Abdominal aortogram-the abdominal aortogram was free of aneurysmal or obstructive disease. There was a small ulcerated plaque on the left lateral distal abdominal aortic wall and no clinical significance 2: Left lower extremity-60% hypodense lesion in the distal left common iliac artery at the junction of the internal/external bifurcation. There was a focal 60% stenosis in the proximal third of the left SFA along with a moderately long calcified segmental mid left SFA in the 50% range with three-vessel runoff 3: Right lower extremity-there was a 40% proximal right common iliac artery stenosis with a less than 20 mm pullback gradient after administration of 200 g of intra-arterial nitroglycerin. The SideArm sheath. There was a 60% segmental proximal right SFA stenosis along with 80% fairly focal calcified distal right SFA stenosis with three-vessel runoff. The proximal third of the right anterior tibial artery appeared to be diffusely diseased in the 75% range. IMPRESSION:[name] has what appears to be a physiologically significant distal left common iliac artery stenosis corresponding to an area of abnormality on duplex ultrasound. There was no pullback gradient across the left SFA area of disease. We will proceed with PTA and stenting of  the distal left common iliac artery using self-expanding stent. The patient is already on dual antiplatelet therapy including aspirin and Plavix. Procedure Description:the 5 French sheath was exchanged over the [place] wire for a 6 [place] multipurpose destination sheath. Direct  stenting was performed with a 10 mm x 4 cm long Cordis Smart nitinol self expanding stent which was postdilated with a 7 mm x 4 cm balloon up to 4 atm resulting in reduction of 60% hypodense distal left common iliac artery stenosis to 0% residual. The sheath was then withdrawn across the bifurcation and exchanged over the same wire for a short 6 French sheath. The patient received 20 mg of intravenous hydralazine at the end of the case for blood pressure control prior to sheath removal. He received a total of 7500 units of heparin during the case with an ending ACT of 214. Total contrast administered was 165 mL to the patient. Final Impression: successful PCA and stenting of a physiologically significant distal left common iliac artery stenosis demonstrated with a pullback radiant of 40 mmHg using a 4 [place] end hole catheter. This was stented with a 10 mm x 4 cm like to also expanding stent. The left SFA disease did not appear to be physiologically significant. The patient does have disease in his right SFA and anterior tibial artery. The sheath will be removed once the ACT falls below 170 pressure held. The patient's serum creatinine was 1.6 this morning the creatinine clearance of 40 mL/m. He'll be hydrated overnight and discharged home in the morning on dual antibiotic therapy. We will get lower extremity are chilled Doppler studies in our [place] on office in one week. He'll be seen back in 2-3 weeks for clinical follow-up as an outpatient.  FOLLOW UP PLANS AND APPOINTMENTS Allergies  Allergen Reactions  . Labetalol Swelling    angioedema     Medication List    STOP taking these medications        telmisartan 40 MG tablet  Commonly known as:  MICARDIS      TAKE these medications        allopurinol 100 MG tablet  Commonly known as:  ZYLOPRIM  Take 1 tablet (100 mg total) by mouth at bedtime.     amLODipine 10 MG tablet  Commonly known as:  NORVASC  Take 1 tablet by mouth every morning.      aspirin EC 81 MG tablet  Take 81 mg by mouth every evening.     calcitRIOL 0.25 MCG capsule  Commonly known as:  ROCALTROL  Take 0.25 mcg by mouth every morning.     cholecalciferol 1000 UNITS tablet  Commonly known as:  VITAMIN D  Take 1,000 Units by mouth 2 (two) times daily.     cloNIDine 0.1 MG tablet  Commonly known as:  CATAPRES  Take 1 tablet (0.1 mg total) by mouth 3 (three) times daily.     clopidogrel 75 MG tablet  Commonly known as:  PLAVIX  TAKE 1 TABLET BY MOUTH DAILY     furosemide 80 MG tablet  Commonly known as:  LASIX  Take 1 tablet (80 mg total) by mouth daily. HOLD 48 hours, resume on 11/07/2014.     hydrALAZINE 50 MG tablet  Commonly known as:  APRESOLINE  Take 1 tablet (50 mg total) by mouth every 8 (eight) hours.     PRESERVISION AREDS PO  Take 2 tablets by mouth daily.     simvastatin 10 MG tablet  Commonly known  as:  ZOCOR  TAKE 1 TABLET (10 MG TOTAL) BY MOUTH AT BEDTIME.     sitaGLIPtin 100 MG tablet  Commonly known as:  JANUVIA  Take 100 mg by mouth every morning.     terazosin 10 MG capsule  Commonly known as:  HYTRIN  Take 1 capsule (10 mg total) by mouth at bedtime.        Discharge Instructions    Diet - low sodium heart healthy    Complete by:  As directed      Diet Carb Modified    Complete by:  As directed      Increase activity slowly    Complete by:  As directed           Follow-up Information    Follow up with Lorretta Harp, MD.   Specialty:  Cardiology   Why:  The office will call.   Contact information:   [place] [address] [phone]       BRING ALL MEDICATIONS WITH YOU TO FOLLOW UP APPOINTMENTS  Time spent with patient to include physician time: 38 min Signed: Rosaria Ferries, PA-C 11/08/2014, 4:22 PM Co-Sign MD

## 2014-11-04 NOTE — Progress Notes (Addendum)
Patient Name: [name] Date of Encounter: 11/04/2014  Active Problems:   Claudication   Claudication in peripheral vascular disease   Primary Cardiologist: Runell GessJonathan J. Berry MD   Patient Profile: 81 y/o male with non-critical CAD, HTN, PAD, carotid artery disease s/p L CEA (2005), CKD, PVCs/PSVT and HLD, admitted 10/31/13 for pre hydration for peripheral angiogram by Dr. Allyson SabalBerry for lifestyle limiting claudication.  SUBJECTIVE: Pt feels well, able to walk in hallway without claudication. Pt says usually runs in 170's, down from 220's systolic yesterday. Denies SOB, pain, cough, and HA.  OBJECTIVE Filed Vitals:   11/03/14 1550 11/03/14 1738 11/03/14 2048 11/04/14 0539  BP: 226/39 192/60 177/39 167/41  Pulse: 73 75 73 70  Temp: 97.7 F (36.5 C) 97.8 F (36.6 C) 98.1 F (36.7 C) 98.2 F (36.8 C)  TempSrc: Oral Oral Oral Oral  Resp: 20  18 18   Height:  5\' 6"  (1.676 m)    Weight:  183 lb 8 oz (83.235 kg)  184 lb 3.2 oz (83.553 kg)  SpO2: 96% 97% 96% 93%    Intake/Output Summary (Last 24 hours) at 11/04/14 1000 Last data filed at 11/04/14 0904  Gross per 24 hour  Intake    480 ml  Output   1100 ml  Net   -620 ml   Filed Weights   11/03/14 0021 11/03/14 1738 11/04/14 0539  Weight: 176 lb 9.4 oz (80.1 kg) 183 lb 8 oz (83.235 kg) 184 lb 3.2 oz (83.553 kg)    PHYSICAL EXAM General: Well developed, well nourished, male in no acute distress. Head: Normocephalic, atraumatic.  Neck: No JVD noted. Lungs:  Resp regular and unlabored, rales bases. Heart: RRR, S1, S2, no S3, S4, 2/6 murmur; no rub. Abdomen: Soft, non-tender, non-distended, BS + x 4.  Extremities: No clubbing, cyanosis, Tr pitting edema bilaterally. 2+ pulses in all extremities. Has bilateral femoral bruits, minimal hematoma right groin cath site.  Neuro: Alert and oriented X 3. Moves all extremities spontaneously. Psych: Normal affect.  LABS: CBC: Recent Labs  11/02/14 0322 11/03/14 0340  WBC 8.1 12.6*    HGB 10.7* 11.3*  HCT 34.4* 35.0*  MCV 84.5 85.4  PLT 239 203   Basic Metabolic Panel: Recent Labs  11/03/14 1445 11/04/14 0855  NA 135 136  K 4.8 4.8  CL 105 107  CO2 24 23  GLUCOSE 138* 152*  BUN 58* 64*  CREATININE 2.98* 3.17*  CALCIUM 8.4 8.2*   TELE:   Few PVCs, 1- 3bt run    Current Medications:  . allopurinol  100 mg Oral Daily  . amLODipine  10 mg Oral Daily  . aspirin EC  81 mg Oral Daily  . calcitRIOL  0.25 mcg Oral Daily  . cholecalciferol  1,000 Units Oral BID  . cloNIDine  0.1 mg Oral TID  . clopidogrel  75 mg Oral Daily  . heparin subcutaneous  5,000 Units Subcutaneous 3 times per day  . hydrALAZINE  50 mg Oral 3 times per day  . insulin aspart  0-9 Units Subcutaneous TID WC  . linagliptin  5 mg Oral Daily  . simvastatin  10 mg Oral q1800  . terazosin  10 mg Oral QHS   . sodium chloride 75 mL/hr at 11/03/14 2328    ASSESSMENT AND PLAN:  Bryan ArchRoy C Lancour is an 81 y/o male with a PMH of non-critical CAD, HTN, PAD, carotid artery disease s/p L CEA (2005), CKD, PVCs/PSVT and HLD, admitted 10/31/13 for  pre hydration for peripheral angiogram by Dr. Allyson Sabal for lifestyle limiting claudication.  Problems:  Claudication in peripheral vascular disease- s/p PCA and stenting of distal LCIA, improved  CKD stage III- Followed by Dr. Arlyn Leak --Renal function continues to decline. 2.98-->3.17 today.   --Continue IVF at 77 -Will repeat later today  Have not contacted renal yet.    Accelerated HTN- Pt says he is at his baseline and is asymptomatic --Continue Clonidine 0.1mg  TID and Hydalazine  TID in addition to amlodipine  daily --BP still running high at 168/39  WIll need to be followed as outpatient     PSVT/PVCs- by 48 hour event monitor 05/2014  Carotid artery stenosis- (L CEA 2005, duplex 06/2014 with progression of disease with plans to repeat USN in  6 months)  Noncritical CAD- in 2002 (low risk nuc 06/2010)  Patient denies CP    Mild anemia  Will need  close f/u    Signed, Vicente Males, PA-S  Seen and agree, with changes made. Theodore Demark , PA-C 10:00 AM 11/04/2014   Patient seen and examined.  I have amended note above to reflect my findings Will repeat labs ths PM.    Dietrich Pates

## 2014-11-04 NOTE — Care Management Note (Signed)
    Page 1 of 1   11/04/2014     3:29:43 PM CARE MANAGEMENT NOTE 11/04/2014  Patient:  Bryan ArchKASEY,[name]   Account Number:  [id number]  Date Initiated:  11/04/2014  Documentation initiated by:  GRAVES-BIGELOW,Tenoch Mcclure  Subjective/Objective Assessment:   Pt admitted for Claudication. Per MD notes: Claudication in PVD- s/p PCA and stenting of distal LCIA, improved   Rising Cr- Plan to --Continue IVF at 77.     Action/Plan:   No needs identified by CM at this time.   Anticipated DC Date:  11/05/2014   Anticipated DC Plan:        DC Planning Services  CM consult      Choice offered to / List presented to:             Status of service:  Completed, signed off Medicare Important Message given?  YES (If response is "NO", the following Medicare IM given date fields will be blank) Date Medicare IM given:  11/04/2014 Medicare IM given by:  GRAVES-BIGELOW,Dempsy Damiano Date Additional Medicare IM given:   Additional Medicare IM given by:    Discharge Disposition:  HOME/SELF CARE  Per UR Regulation:  Reviewed for med. necessity/level of care/duration of stay  If discussed at Long Length of Stay Meetings, dates discussed:    Comments:

## 2014-11-09 ENCOUNTER — Telehealth: Payer: Self-pay | Admitting: *Deleted

## 2014-11-09 DIAGNOSIS — E785 Hyperlipidemia, unspecified: Secondary | ICD-10-CM

## 2014-11-09 DIAGNOSIS — Z79899 Other long term (current) drug therapy: Secondary | ICD-10-CM

## 2014-11-09 MED ORDER — SIMVASTATIN 20 MG PO TABS: 20.0000 mg | ORAL_TABLET | Freq: Every day | ORAL | Status: DC

## 2014-11-09 NOTE — Telephone Encounter (Signed)
-----   Message from Runell GessJonathan J Berry, MD sent at 11/08/2014  4:09 PM EST ----- Increase simvastatin from 10-20 mg a day and recheck lipid and liver profile in 2 months

## 2014-11-09 NOTE — Telephone Encounter (Signed)
Patient notified and agreeable to increase simvastatin to 20mg  daily.  ERX sent in and lab slip mailed to patient for repeat labs

## 2014-11-11 ENCOUNTER — Other Ambulatory Visit (HOSPITAL_COMMUNITY): Payer: Self-pay | Admitting: *Deleted

## 2014-11-12 ENCOUNTER — Encounter (HOSPITAL_COMMUNITY)
Admission: RE | Admit: 2014-11-12 | Discharge: 2014-11-12 | Disposition: A | Discharge status: Home / Self Care | Payer: Medicare Other | Source: Ambulatory Visit | Attending: Nephrology | Admitting: Nephrology

## 2014-11-12 DIAGNOSIS — N183 Chronic kidney disease, stage 3 (moderate): Secondary | ICD-10-CM | POA: Diagnosis not present

## 2014-11-12 DIAGNOSIS — D631 Anemia in chronic kidney disease: Secondary | ICD-10-CM | POA: Diagnosis present

## 2014-11-12 LAB — POCT HEMOGLOBIN-HEMACUE: Hemoglobin: 10.5 g/dL — ABNORMAL LOW (ref 13.0–17.0)

## 2014-11-12 MED ORDER — EPOETIN ALFA 40000 UNIT/ML IJ SOLN: 30000.0000 [IU] | INTRAMUSCULAR | Status: DC

## 2014-11-12 MED ORDER — EPOETIN ALFA 20000 UNIT/ML IJ SOLN
INTRAMUSCULAR | Status: AC
  Administered 2014-11-12: 20000 [IU] via SUBCUTANEOUS
  Filled 2014-11-12: qty 1

## 2014-11-12 MED ORDER — SODIUM CHLORIDE 0.9 % IV SOLN
510.0000 mg | INTRAVENOUS | Status: DC
  Administered 2014-11-12: 510 mg via INTRAVENOUS
  Filled 2014-11-12: qty 17

## 2014-11-12 MED ORDER — EPOETIN ALFA 10000 UNIT/ML IJ SOLN
INTRAMUSCULAR | Status: AC
Start: 2014-11-12 — End: 2014-11-12
  Administered 2014-11-12: 10000 [IU] via SUBCUTANEOUS
  Filled 2014-11-12: qty 1

## 2014-11-17 ENCOUNTER — Encounter (HOSPITAL_COMMUNITY)
Admission: RE | Admit: 2014-11-17 | Discharge: 2014-11-17 | Disposition: A | Discharge status: Home / Self Care | Payer: Medicare Other | Source: Ambulatory Visit | Attending: Nephrology | Admitting: Nephrology

## 2014-11-17 DIAGNOSIS — D631 Anemia in chronic kidney disease: Secondary | ICD-10-CM | POA: Diagnosis not present

## 2014-11-17 MED ORDER — EPOETIN ALFA 10000 UNIT/ML IJ SOLN
INTRAMUSCULAR | Status: AC
  Administered 2014-11-17: 10000 [IU]
  Filled 2014-11-17: qty 1

## 2014-11-17 MED ORDER — EPOETIN ALFA 20000 UNIT/ML IJ SOLN
INTRAMUSCULAR | Status: AC
  Administered 2014-11-17: 20000 [IU]
  Filled 2014-11-17: qty 1

## 2014-11-17 MED ORDER — EPOETIN ALFA 40000 UNIT/ML IJ SOLN: 30000.0000 [IU] | INTRAMUSCULAR | Status: DC

## 2014-11-18 LAB — POCT HEMOGLOBIN-HEMACUE: Hemoglobin: 11.4 g/dL — ABNORMAL LOW (ref 13.0–17.0)

## 2014-11-24 ENCOUNTER — Encounter (HOSPITAL_COMMUNITY)
Admission: RE | Admit: 2014-11-24 | Discharge: 2014-11-24 | Disposition: A | Discharge status: Home / Self Care | Payer: Medicare Other | Source: Ambulatory Visit | Attending: Nephrology | Admitting: Nephrology

## 2014-11-24 ENCOUNTER — Ambulatory Visit (HOSPITAL_COMMUNITY)
Admission: RE | Admit: 2014-11-24 | Discharge: 2014-11-24 | Disposition: A | Discharge status: Home / Self Care | Payer: Medicare Other | Source: Ambulatory Visit | Attending: Physician Assistant | Admitting: Physician Assistant

## 2014-11-24 ENCOUNTER — Other Ambulatory Visit: Payer: Self-pay | Admitting: Cardiovascular Disease

## 2014-11-24 DIAGNOSIS — D631 Anemia in chronic kidney disease: Secondary | ICD-10-CM | POA: Diagnosis not present

## 2014-11-24 DIAGNOSIS — I739 Peripheral vascular disease, unspecified: Secondary | ICD-10-CM | POA: Diagnosis not present

## 2014-11-24 LAB — IRON AND TIBC
Iron: 78 ug/dL (ref 42–165)
Saturation Ratios: 42 % (ref 20–55)
TIBC: 186 ug/dL — AB (ref 215–435)
UIBC: 108 ug/dL — AB (ref 125–400)

## 2014-11-24 LAB — POCT HEMOGLOBIN-HEMACUE: HEMOGLOBIN: 10.6 g/dL — AB (ref 13.0–17.0)

## 2014-11-24 LAB — FERRITIN: FERRITIN: 144 ng/mL (ref 22–322)

## 2014-11-24 MED ORDER — EPOETIN ALFA 20000 UNIT/ML IJ SOLN
INTRAMUSCULAR | Status: AC
  Administered 2014-11-24: 20000 [IU]
  Filled 2014-11-24: qty 1

## 2014-11-24 MED ORDER — EPOETIN ALFA 40000 UNIT/ML IJ SOLN: 30000.0000 [IU] | INTRAMUSCULAR | Status: DC

## 2014-11-24 MED ORDER — EPOETIN ALFA 10000 UNIT/ML IJ SOLN
INTRAMUSCULAR | Status: AC
  Administered 2014-11-24: 10000 [IU]
  Filled 2014-11-24: qty 1

## 2014-11-24 NOTE — Telephone Encounter (Signed)
Rx(s) sent to pharmacy electronically.  

## 2014-11-25 NOTE — Progress Notes (Signed)
Arterial Duplex Lower Ext. Completed. Shone Leventhal, BS, RDMS, RVT  

## 2014-12-01 ENCOUNTER — Encounter: Payer: Self-pay | Admitting: *Deleted

## 2014-12-01 ENCOUNTER — Encounter (HOSPITAL_COMMUNITY)
Admission: RE | Admit: 2014-12-01 | Discharge: 2014-12-01 | Disposition: A | Discharge status: Home / Self Care | Payer: Medicare Other | Source: Ambulatory Visit | Attending: Nephrology | Admitting: Nephrology

## 2014-12-01 DIAGNOSIS — D631 Anemia in chronic kidney disease: Secondary | ICD-10-CM | POA: Insufficient documentation

## 2014-12-01 DIAGNOSIS — N183 Chronic kidney disease, stage 3 (moderate): Secondary | ICD-10-CM | POA: Diagnosis not present

## 2014-12-01 LAB — POCT HEMOGLOBIN-HEMACUE: HEMOGLOBIN: 11.5 g/dL — AB (ref 13.0–17.0)

## 2014-12-01 MED ORDER — EPOETIN ALFA 10000 UNIT/ML IJ SOLN
INTRAMUSCULAR | Status: AC
  Administered 2014-12-01: 10000 [IU] via SUBCUTANEOUS
  Filled 2014-12-01: qty 1

## 2014-12-01 MED ORDER — EPOETIN ALFA 40000 UNIT/ML IJ SOLN: 30000.0000 [IU] | INTRAMUSCULAR | Status: DC

## 2014-12-01 MED ORDER — EPOETIN ALFA 20000 UNIT/ML IJ SOLN
INTRAMUSCULAR | Status: AC
  Administered 2014-12-01: 20000 [IU] via SUBCUTANEOUS
  Filled 2014-12-01: qty 1

## 2014-12-02 ENCOUNTER — Telehealth: Payer: Self-pay | Admitting: Cardiovascular Disease

## 2014-12-02 DIAGNOSIS — N183 Chronic kidney disease, stage 3 unspecified: Secondary | ICD-10-CM

## 2014-12-02 NOTE — Telephone Encounter (Signed)
-----   Message from Darrol Jumphonda G Barrett, PA-C sent at 11/29/2014  8:11 PM EST ----- Willaim ShengBillie,  I see that he has an appointment with Dr. Allyson SabalBerry, but I don't see any labs.  I really wanted to get at least a BMET soon. He is coming in for a Procrit injection and a day or 2, D think you can get him to go by the lab that day?  Thanks Bjorn Loserhonda ----- Message -----    From: Darrol Jumphonda G Barrett, PA-C    Sent: 11/08/2014   5:37 PM      To: Maudie MercuryBillie D Robinson, Rhonda G Barrett, PA-C  Patient of Dr. Allyson SabalBerry. He came in and got a PV intervention, but had to stay extra days because his renal function worsened. I wrote orders for BMET and a BNP. He also needs a TOC/TCM appointment this week.  I thought I sent a message on this last Thursday, but I can't find it. If you could make these appointments as soon as possible that would be great if there is anything else I need to do to make the labs happen, blood me know. [facility]  Thanks, Bjorn Loserhonda

## 2014-12-02 NOTE — Telephone Encounter (Signed)
Called patient, he is agreeable to going by lab same day of injection or next time he is in town. [facility] so I submitted draw order for that lab.

## 2014-12-08 ENCOUNTER — Encounter (HOSPITAL_COMMUNITY)
Admission: RE | Admit: 2014-12-08 | Discharge: 2014-12-08 | Disposition: A | Discharge status: Home / Self Care | Payer: Medicare Other | Source: Ambulatory Visit | Attending: Nephrology | Admitting: Nephrology

## 2014-12-08 DIAGNOSIS — D631 Anemia in chronic kidney disease: Secondary | ICD-10-CM | POA: Diagnosis not present

## 2014-12-08 LAB — POCT HEMOGLOBIN-HEMACUE: Hemoglobin: 12.7 g/dL — ABNORMAL LOW (ref 13.0–17.0)

## 2014-12-08 MED ORDER — EPOETIN ALFA 40000 UNIT/ML IJ SOLN: 30000.0000 [IU] | INTRAMUSCULAR | Status: DC

## 2014-12-13 ENCOUNTER — Telehealth: Payer: Self-pay | Admitting: Cardiovascular Disease

## 2014-12-13 NOTE — Telephone Encounter (Signed)
[facility]14 with Dr Allyson SabalBerry.  [facility] for Dr Hazle CocaBerry's schedule on 12/14/14.  lp

## 2014-12-14 ENCOUNTER — Ambulatory Visit: Payer: Medicare Other | Admitting: Cardiovascular Disease

## 2014-12-22 ENCOUNTER — Encounter (HOSPITAL_COMMUNITY): Payer: Medicare Other

## 2014-12-23 ENCOUNTER — Encounter (HOSPITAL_COMMUNITY)
Admission: RE | Admit: 2014-12-23 | Discharge: 2014-12-23 | Disposition: A | Discharge status: Home / Self Care | Payer: Medicare Other | Source: Ambulatory Visit | Attending: Nephrology | Admitting: Nephrology

## 2014-12-23 DIAGNOSIS — D631 Anemia in chronic kidney disease: Secondary | ICD-10-CM | POA: Diagnosis not present

## 2014-12-23 LAB — FERRITIN: FERRITIN: 74 ng/mL (ref 22–322)

## 2014-12-23 LAB — IRON AND TIBC
IRON: 123 ug/dL (ref 42–165)
Saturation Ratios: 53 % (ref 20–55)
TIBC: 232 ug/dL (ref 215–435)
UIBC: 109 ug/dL — ABNORMAL LOW (ref 125–400)

## 2014-12-23 LAB — POCT HEMOGLOBIN-HEMACUE: Hemoglobin: 11.9 g/dL — ABNORMAL LOW (ref 13.0–17.0)

## 2014-12-23 MED ORDER — EPOETIN ALFA 20000 UNIT/ML IJ SOLN
INTRAMUSCULAR | Status: AC
Start: 2014-12-23 — End: 2014-12-23
  Administered 2014-12-23: 20000 [IU]
  Filled 2014-12-23: qty 1

## 2014-12-23 MED ORDER — EPOETIN ALFA 40000 UNIT/ML IJ SOLN: 30000.0000 [IU] | INTRAMUSCULAR | Status: DC

## 2014-12-23 MED ORDER — EPOETIN ALFA 10000 UNIT/ML IJ SOLN
INTRAMUSCULAR | Status: AC
  Administered 2014-12-23: 10000 [IU]
  Filled 2014-12-23: qty 1

## 2014-12-27 ENCOUNTER — Other Ambulatory Visit (HOSPITAL_COMMUNITY): Payer: Self-pay | Admitting: Cardiovascular Disease

## 2014-12-27 DIAGNOSIS — I6523 Occlusion and stenosis of bilateral carotid arteries: Secondary | ICD-10-CM

## 2014-12-28 ENCOUNTER — Other Ambulatory Visit (HOSPITAL_COMMUNITY): Payer: Self-pay | Admitting: *Deleted

## 2014-12-29 ENCOUNTER — Encounter (HOSPITAL_COMMUNITY)
Admission: RE | Admit: 2014-12-29 | Discharge: 2014-12-29 | Disposition: A | Discharge status: Home / Self Care | Payer: Medicare Other | Source: Ambulatory Visit | Attending: Nephrology | Admitting: Nephrology

## 2014-12-29 DIAGNOSIS — N183 Chronic kidney disease, stage 3 (moderate): Secondary | ICD-10-CM | POA: Diagnosis not present

## 2014-12-29 DIAGNOSIS — D631 Anemia in chronic kidney disease: Secondary | ICD-10-CM | POA: Insufficient documentation

## 2014-12-29 LAB — POCT HEMOGLOBIN-HEMACUE: Hemoglobin: 11.7 g/dL — ABNORMAL LOW (ref 13.0–17.0)

## 2014-12-29 MED ORDER — EPOETIN ALFA 10000 UNIT/ML IJ SOLN
INTRAMUSCULAR | Status: AC
  Administered 2014-12-29: 10000 [IU] via SUBCUTANEOUS
  Filled 2014-12-29: qty 1

## 2014-12-29 MED ORDER — EPOETIN ALFA 40000 UNIT/ML IJ SOLN: 30000.0000 [IU] | INTRAMUSCULAR | Status: DC

## 2014-12-29 MED ORDER — EPOETIN ALFA 20000 UNIT/ML IJ SOLN
INTRAMUSCULAR | Status: AC
  Administered 2014-12-29: 20000 [IU] via SUBCUTANEOUS
  Filled 2014-12-29: qty 1

## 2015-01-04 ENCOUNTER — Ambulatory Visit (HOSPITAL_COMMUNITY)
Admission: RE | Admit: 2015-01-04 | Discharge: 2015-01-04 | Disposition: A | Discharge status: Home / Self Care | Payer: Medicare Other | Source: Ambulatory Visit | Attending: Cardiology | Admitting: Cardiology

## 2015-01-04 DIAGNOSIS — I6523 Occlusion and stenosis of bilateral carotid arteries: Secondary | ICD-10-CM | POA: Diagnosis present

## 2015-01-04 NOTE — Progress Notes (Signed)
Carotid Duplex Completed. °Brianna L Mazza,RVT °

## 2015-01-05 ENCOUNTER — Encounter (HOSPITAL_COMMUNITY)
Admission: RE | Admit: 2015-01-05 | Discharge: 2015-01-05 | Disposition: A | Discharge status: Home / Self Care | Payer: Medicare Other | Source: Ambulatory Visit | Attending: Nephrology | Admitting: Nephrology

## 2015-01-05 DIAGNOSIS — D631 Anemia in chronic kidney disease: Secondary | ICD-10-CM | POA: Diagnosis not present

## 2015-01-05 LAB — POCT HEMOGLOBIN-HEMACUE: HEMOGLOBIN: 11.9 g/dL — AB (ref 13.0–17.0)

## 2015-01-05 MED ORDER — EPOETIN ALFA 40000 UNIT/ML IJ SOLN: 30000.0000 [IU] | INTRAMUSCULAR | Status: DC

## 2015-01-05 MED ORDER — EPOETIN ALFA 20000 UNIT/ML IJ SOLN
INTRAMUSCULAR | Status: AC
  Administered 2015-01-05: 20000 [IU] via SUBCUTANEOUS
  Filled 2015-01-05: qty 1

## 2015-01-05 MED ORDER — EPOETIN ALFA 10000 UNIT/ML IJ SOLN
INTRAMUSCULAR | Status: AC
  Administered 2015-01-05: 10000 [IU] via SUBCUTANEOUS
  Filled 2015-01-05: qty 1

## 2015-01-11 ENCOUNTER — Encounter: Payer: Self-pay | Admitting: *Deleted

## 2015-01-12 ENCOUNTER — Encounter (INDEPENDENT_AMBULATORY_CARE_PROVIDER_SITE_OTHER): Payer: Self-pay

## 2015-01-12 ENCOUNTER — Encounter (HOSPITAL_COMMUNITY)
Admission: RE | Admit: 2015-01-12 | Discharge: 2015-01-12 | Disposition: A | Discharge status: Home / Self Care | Payer: Medicare Other | Source: Ambulatory Visit | Attending: Nephrology | Admitting: Nephrology

## 2015-01-12 DIAGNOSIS — D631 Anemia in chronic kidney disease: Secondary | ICD-10-CM | POA: Diagnosis not present

## 2015-01-12 LAB — POCT HEMOGLOBIN-HEMACUE: HEMOGLOBIN: 12.2 g/dL — AB (ref 13.0–17.0)

## 2015-01-12 MED ORDER — EPOETIN ALFA 40000 UNIT/ML IJ SOLN: 30000.0000 [IU] | INTRAMUSCULAR | Status: DC

## 2015-01-22 ENCOUNTER — Other Ambulatory Visit: Payer: Self-pay | Admitting: Physician Assistant

## 2015-01-24 NOTE — Telephone Encounter (Signed)
Rx(s) sent to pharmacy electronically.  

## 2015-01-25 ENCOUNTER — Other Ambulatory Visit: Payer: Self-pay | Admitting: Physician Assistant

## 2015-01-26 ENCOUNTER — Other Ambulatory Visit: Payer: Self-pay | Admitting: Physician Assistant

## 2015-01-26 ENCOUNTER — Encounter (HOSPITAL_COMMUNITY)
Admission: RE | Admit: 2015-01-26 | Discharge: 2015-01-26 | Disposition: A | Discharge status: Home / Self Care | Payer: Medicare Other | Source: Ambulatory Visit | Attending: Nephrology | Admitting: Nephrology

## 2015-01-26 DIAGNOSIS — D631 Anemia in chronic kidney disease: Secondary | ICD-10-CM | POA: Diagnosis not present

## 2015-01-26 LAB — POCT HEMOGLOBIN-HEMACUE: Hemoglobin: 10.3 g/dL — ABNORMAL LOW (ref 13.0–17.0)

## 2015-01-26 LAB — IRON AND TIBC
Iron: 84 ug/dL (ref 42–165)
Saturation Ratios: 38 % (ref 20–55)
TIBC: 222 ug/dL (ref 215–435)
UIBC: 138 ug/dL (ref 125–400)

## 2015-01-26 LAB — FERRITIN: Ferritin: 65 ng/mL (ref 22–322)

## 2015-01-26 MED ORDER — EPOETIN ALFA 10000 UNIT/ML IJ SOLN
INTRAMUSCULAR | Status: AC
  Administered 2015-01-26: 10000 [IU] via SUBCUTANEOUS
  Filled 2015-01-26: qty 1

## 2015-01-26 MED ORDER — EPOETIN ALFA 40000 UNIT/ML IJ SOLN: 30000.0000 [IU] | INTRAMUSCULAR | Status: DC

## 2015-01-26 MED ORDER — EPOETIN ALFA 20000 UNIT/ML IJ SOLN
INTRAMUSCULAR | Status: AC
  Administered 2015-01-26: 20000 [IU] via SUBCUTANEOUS
  Filled 2015-01-26: qty 1

## 2015-01-26 NOTE — Telephone Encounter (Signed)
Allopurinol refused - defer to PCP 

## 2015-02-02 ENCOUNTER — Encounter (HOSPITAL_COMMUNITY)
Admission: RE | Admit: 2015-02-02 | Discharge: 2015-02-02 | Disposition: A | Discharge status: Home / Self Care | Payer: Medicare Other | Source: Ambulatory Visit | Attending: Nephrology | Admitting: Nephrology

## 2015-02-02 DIAGNOSIS — D631 Anemia in chronic kidney disease: Secondary | ICD-10-CM | POA: Diagnosis present

## 2015-02-02 DIAGNOSIS — N183 Chronic kidney disease, stage 3 (moderate): Secondary | ICD-10-CM | POA: Insufficient documentation

## 2015-02-02 LAB — POCT HEMOGLOBIN-HEMACUE: HEMOGLOBIN: 10.2 g/dL — AB (ref 13.0–17.0)

## 2015-02-02 MED ORDER — EPOETIN ALFA 40000 UNIT/ML IJ SOLN: 30000.0000 [IU] | INTRAMUSCULAR | Status: DC

## 2015-02-02 MED ORDER — EPOETIN ALFA 10000 UNIT/ML IJ SOLN
INTRAMUSCULAR | Status: AC
  Administered 2015-02-02: 10000 [IU] via SUBCUTANEOUS
  Filled 2015-02-02: qty 1

## 2015-02-02 MED ORDER — EPOETIN ALFA 20000 UNIT/ML IJ SOLN
INTRAMUSCULAR | Status: AC
Start: 2015-02-02 — End: 2015-02-02
  Administered 2015-02-02: 20000 [IU] via SUBCUTANEOUS
  Filled 2015-02-02: qty 1

## 2015-02-09 ENCOUNTER — Encounter (HOSPITAL_COMMUNITY)
Admission: RE | Admit: 2015-02-09 | Discharge: 2015-02-09 | Disposition: A | Discharge status: Home / Self Care | Payer: Medicare Other | Source: Ambulatory Visit | Attending: Nephrology | Admitting: Nephrology

## 2015-02-09 DIAGNOSIS — D631 Anemia in chronic kidney disease: Secondary | ICD-10-CM | POA: Diagnosis not present

## 2015-02-09 LAB — POCT HEMOGLOBIN-HEMACUE: HEMOGLOBIN: 10.9 g/dL — AB (ref 13.0–17.0)

## 2015-02-09 MED ORDER — EPOETIN ALFA 20000 UNIT/ML IJ SOLN
INTRAMUSCULAR | Status: AC
  Administered 2015-02-09: 20000 [IU]
  Filled 2015-02-09: qty 1

## 2015-02-09 MED ORDER — EPOETIN ALFA 40000 UNIT/ML IJ SOLN: 30000.0000 [IU] | INTRAMUSCULAR | Status: DC

## 2015-02-09 MED ORDER — EPOETIN ALFA 10000 UNIT/ML IJ SOLN
INTRAMUSCULAR | Status: AC
  Administered 2015-02-09: 10000 [IU]
  Filled 2015-02-09: qty 1

## 2015-02-16 ENCOUNTER — Encounter (HOSPITAL_COMMUNITY)
Admission: RE | Admit: 2015-02-16 | Discharge: 2015-02-16 | Disposition: A | Discharge status: Home / Self Care | Payer: Medicare Other | Source: Ambulatory Visit | Attending: Nephrology | Admitting: Nephrology

## 2015-02-16 DIAGNOSIS — D631 Anemia in chronic kidney disease: Secondary | ICD-10-CM | POA: Diagnosis not present

## 2015-02-16 LAB — POCT HEMOGLOBIN-HEMACUE: HEMOGLOBIN: 11.2 g/dL — AB (ref 13.0–17.0)

## 2015-02-16 MED ORDER — EPOETIN ALFA 10000 UNIT/ML IJ SOLN
INTRAMUSCULAR | Status: AC
  Administered 2015-02-16: 10000 [IU] via SUBCUTANEOUS
  Filled 2015-02-16: qty 1

## 2015-02-16 MED ORDER — EPOETIN ALFA 20000 UNIT/ML IJ SOLN
INTRAMUSCULAR | Status: AC
  Administered 2015-02-16: 20000 [IU] via SUBCUTANEOUS
  Filled 2015-02-16: qty 1

## 2015-02-16 MED ORDER — EPOETIN ALFA 40000 UNIT/ML IJ SOLN: 30000.0000 [IU] | INTRAMUSCULAR | Status: DC

## 2015-02-22 ENCOUNTER — Other Ambulatory Visit (HOSPITAL_COMMUNITY): Payer: Self-pay | Admitting: *Deleted

## 2015-02-23 ENCOUNTER — Encounter (HOSPITAL_COMMUNITY)
Admission: RE | Admit: 2015-02-23 | Discharge: 2015-02-23 | Disposition: A | Discharge status: Home / Self Care | Payer: Medicare Other | Source: Ambulatory Visit | Attending: Nephrology | Admitting: Nephrology

## 2015-02-23 DIAGNOSIS — D631 Anemia in chronic kidney disease: Secondary | ICD-10-CM | POA: Diagnosis not present

## 2015-02-23 LAB — IRON AND TIBC
IRON: 36 ug/dL — AB (ref 42–165)
SATURATION RATIOS: 13 % — AB (ref 20–55)
TIBC: 280 ug/dL (ref 215–435)
UIBC: 244 ug/dL (ref 125–400)

## 2015-02-23 LAB — FERRITIN: Ferritin: 17 ng/mL — ABNORMAL LOW (ref 22–322)

## 2015-02-23 LAB — POCT HEMOGLOBIN-HEMACUE: Hemoglobin: 11.8 g/dL — ABNORMAL LOW (ref 13.0–17.0)

## 2015-02-23 MED ORDER — EPOETIN ALFA 10000 UNIT/ML IJ SOLN
INTRAMUSCULAR | Status: AC
  Administered 2015-02-23: 10000 [IU] via SUBCUTANEOUS
  Filled 2015-02-23: qty 1

## 2015-02-23 MED ORDER — EPOETIN ALFA 40000 UNIT/ML IJ SOLN: 30000.0000 [IU] | INTRAMUSCULAR | Status: DC

## 2015-02-23 MED ORDER — EPOETIN ALFA 20000 UNIT/ML IJ SOLN
INTRAMUSCULAR | Status: AC
  Administered 2015-02-23: 20000 [IU] via SUBCUTANEOUS
  Filled 2015-02-23: qty 1

## 2015-03-01 ENCOUNTER — Other Ambulatory Visit (HOSPITAL_COMMUNITY): Payer: Self-pay | Admitting: *Deleted

## 2015-03-02 ENCOUNTER — Encounter (HOSPITAL_COMMUNITY)
Admission: RE | Admit: 2015-03-02 | Discharge: 2015-03-02 | Disposition: A | Discharge status: Home / Self Care | Payer: Medicare Other | Source: Ambulatory Visit | Attending: Nephrology | Admitting: Nephrology

## 2015-03-02 DIAGNOSIS — D631 Anemia in chronic kidney disease: Secondary | ICD-10-CM | POA: Diagnosis present

## 2015-03-02 DIAGNOSIS — N183 Chronic kidney disease, stage 3 (moderate): Secondary | ICD-10-CM | POA: Diagnosis not present

## 2015-03-02 LAB — POCT HEMOGLOBIN-HEMACUE: Hemoglobin: 12.2 g/dL — ABNORMAL LOW (ref 13.0–17.0)

## 2015-03-02 MED ORDER — EPOETIN ALFA 40000 UNIT/ML IJ SOLN: 30000.0000 [IU] | INTRAMUSCULAR | Status: DC

## 2015-03-02 MED ORDER — FERUMOXYTOL INJECTION 510 MG/17 ML
510.0000 mg | INTRAVENOUS | Status: DC
Start: 2015-03-02 — End: 2015-03-03
  Administered 2015-03-02: 510 mg via INTRAVENOUS
  Filled 2015-03-02: qty 17

## 2015-03-09 ENCOUNTER — Encounter (HOSPITAL_COMMUNITY): Payer: Medicare Other

## 2015-03-15 ENCOUNTER — Other Ambulatory Visit (HOSPITAL_COMMUNITY): Payer: Self-pay | Admitting: *Deleted

## 2015-03-16 ENCOUNTER — Encounter (HOSPITAL_COMMUNITY)
Admission: RE | Admit: 2015-03-16 | Discharge: 2015-03-16 | Disposition: A | Discharge status: Home / Self Care | Payer: Medicare Other | Source: Ambulatory Visit | Attending: Nephrology | Admitting: Nephrology

## 2015-03-16 DIAGNOSIS — D631 Anemia in chronic kidney disease: Secondary | ICD-10-CM | POA: Diagnosis not present

## 2015-03-16 LAB — POCT HEMOGLOBIN-HEMACUE: Hemoglobin: 11 g/dL — ABNORMAL LOW (ref 13.0–17.0)

## 2015-03-16 MED ORDER — EPOETIN ALFA 10000 UNIT/ML IJ SOLN
INTRAMUSCULAR | Status: AC
  Administered 2015-03-16: 10000 [IU] via SUBCUTANEOUS
  Filled 2015-03-16: qty 1

## 2015-03-16 MED ORDER — EPOETIN ALFA 20000 UNIT/ML IJ SOLN
INTRAMUSCULAR | Status: AC
  Administered 2015-03-16: 20000 [IU] via SUBCUTANEOUS
  Filled 2015-03-16: qty 1

## 2015-03-16 MED ORDER — SODIUM CHLORIDE 0.9 % IV SOLN
510.0000 mg | Freq: Once | INTRAVENOUS | Status: AC
  Administered 2015-03-16: 510 mg via INTRAVENOUS
  Filled 2015-03-16: qty 17

## 2015-03-16 MED ORDER — EPOETIN ALFA 40000 UNIT/ML IJ SOLN: 30000.0000 [IU] | INTRAMUSCULAR | Status: DC

## 2015-03-21 ENCOUNTER — Emergency Department (HOSPITAL_COMMUNITY): Payer: Medicare Other

## 2015-03-21 ENCOUNTER — Inpatient Hospital Stay (HOSPITAL_COMMUNITY)
Admission: EM | Admit: 2015-03-21 | Discharge: 2015-03-29 | DRG: 409 | Disposition: A | Discharge status: Home Health | Payer: Medicare Other | Attending: Internal Medicine | Admitting: Internal Medicine

## 2015-03-21 ENCOUNTER — Encounter (HOSPITAL_COMMUNITY): Payer: Self-pay | Admitting: *Deleted

## 2015-03-21 DIAGNOSIS — K8062 Calculus of gallbladder and bile duct with acute cholecystitis without obstruction: Secondary | ICD-10-CM | POA: Diagnosis present

## 2015-03-21 DIAGNOSIS — Z7902 Long term (current) use of antithrombotics/antiplatelets: Secondary | ICD-10-CM

## 2015-03-21 DIAGNOSIS — R1013 Epigastric pain: Secondary | ICD-10-CM | POA: Diagnosis not present

## 2015-03-21 DIAGNOSIS — R1011 Right upper quadrant pain: Secondary | ICD-10-CM

## 2015-03-21 DIAGNOSIS — I2510 Atherosclerotic heart disease of native coronary artery without angina pectoris: Secondary | ICD-10-CM | POA: Diagnosis present

## 2015-03-21 DIAGNOSIS — R945 Abnormal results of liver function studies: Secondary | ICD-10-CM

## 2015-03-21 DIAGNOSIS — Z79899 Other long term (current) drug therapy: Secondary | ICD-10-CM

## 2015-03-21 DIAGNOSIS — I739 Peripheral vascular disease, unspecified: Secondary | ICD-10-CM | POA: Diagnosis present

## 2015-03-21 DIAGNOSIS — E860 Dehydration: Secondary | ICD-10-CM

## 2015-03-21 DIAGNOSIS — E1122 Type 2 diabetes mellitus with diabetic chronic kidney disease: Secondary | ICD-10-CM | POA: Diagnosis present

## 2015-03-21 DIAGNOSIS — H3530 Unspecified macular degeneration: Secondary | ICD-10-CM | POA: Diagnosis present

## 2015-03-21 DIAGNOSIS — K449 Diaphragmatic hernia without obstruction or gangrene: Secondary | ICD-10-CM

## 2015-03-21 DIAGNOSIS — M109 Gout, unspecified: Secondary | ICD-10-CM | POA: Diagnosis present

## 2015-03-21 DIAGNOSIS — N183 Chronic kidney disease, stage 3 unspecified: Secondary | ICD-10-CM

## 2015-03-21 DIAGNOSIS — K810 Acute cholecystitis: Secondary | ICD-10-CM

## 2015-03-21 DIAGNOSIS — R778 Other specified abnormalities of plasma proteins: Secondary | ICD-10-CM | POA: Insufficient documentation

## 2015-03-21 DIAGNOSIS — Z87891 Personal history of nicotine dependence: Secondary | ICD-10-CM | POA: Diagnosis not present

## 2015-03-21 DIAGNOSIS — E1165 Type 2 diabetes mellitus with hyperglycemia: Secondary | ICD-10-CM

## 2015-03-21 DIAGNOSIS — R109 Unspecified abdominal pain: Secondary | ICD-10-CM | POA: Diagnosis not present

## 2015-03-21 DIAGNOSIS — R001 Bradycardia, unspecified: Secondary | ICD-10-CM | POA: Diagnosis not present

## 2015-03-21 DIAGNOSIS — R0602 Shortness of breath: Secondary | ICD-10-CM

## 2015-03-21 DIAGNOSIS — R748 Abnormal levels of other serum enzymes: Secondary | ICD-10-CM | POA: Insufficient documentation

## 2015-03-21 DIAGNOSIS — Z7982 Long term (current) use of aspirin: Secondary | ICD-10-CM | POA: Diagnosis not present

## 2015-03-21 DIAGNOSIS — E876 Hypokalemia: Secondary | ICD-10-CM | POA: Diagnosis present

## 2015-03-21 DIAGNOSIS — N189 Chronic kidney disease, unspecified: Secondary | ICD-10-CM | POA: Insufficient documentation

## 2015-03-21 DIAGNOSIS — Z888 Allergy status to other drugs, medicaments and biological substances status: Secondary | ICD-10-CM

## 2015-03-21 DIAGNOSIS — N179 Acute kidney failure, unspecified: Secondary | ICD-10-CM | POA: Diagnosis present

## 2015-03-21 DIAGNOSIS — E1140 Type 2 diabetes mellitus with diabetic neuropathy, unspecified: Secondary | ICD-10-CM | POA: Diagnosis present

## 2015-03-21 DIAGNOSIS — I248 Other forms of acute ischemic heart disease: Secondary | ICD-10-CM | POA: Diagnosis present

## 2015-03-21 DIAGNOSIS — R7989 Other specified abnormal findings of blood chemistry: Secondary | ICD-10-CM

## 2015-03-21 DIAGNOSIS — R634 Abnormal weight loss: Secondary | ICD-10-CM | POA: Diagnosis present

## 2015-03-21 DIAGNOSIS — E119 Type 2 diabetes mellitus without complications: Secondary | ICD-10-CM | POA: Diagnosis present

## 2015-03-21 DIAGNOSIS — R011 Cardiac murmur, unspecified: Secondary | ICD-10-CM | POA: Diagnosis not present

## 2015-03-21 DIAGNOSIS — D638 Anemia in other chronic diseases classified elsewhere: Secondary | ICD-10-CM | POA: Diagnosis present

## 2015-03-21 DIAGNOSIS — N184 Chronic kidney disease, stage 4 (severe): Secondary | ICD-10-CM | POA: Diagnosis present

## 2015-03-21 DIAGNOSIS — I129 Hypertensive chronic kidney disease with stage 1 through stage 4 chronic kidney disease, or unspecified chronic kidney disease: Secondary | ICD-10-CM | POA: Diagnosis present

## 2015-03-21 DIAGNOSIS — E780 Pure hypercholesterolemia: Secondary | ICD-10-CM | POA: Diagnosis present

## 2015-03-21 DIAGNOSIS — E785 Hyperlipidemia, unspecified: Secondary | ICD-10-CM | POA: Diagnosis present

## 2015-03-21 DIAGNOSIS — I160 Hypertensive urgency: Secondary | ICD-10-CM | POA: Diagnosis present

## 2015-03-21 DIAGNOSIS — IMO0002 Reserved for concepts with insufficient information to code with codable children: Secondary | ICD-10-CM

## 2015-03-21 DIAGNOSIS — R1010 Upper abdominal pain, unspecified: Secondary | ICD-10-CM | POA: Diagnosis not present

## 2015-03-21 DIAGNOSIS — I10 Essential (primary) hypertension: Secondary | ICD-10-CM | POA: Diagnosis not present

## 2015-03-21 DIAGNOSIS — K5900 Constipation, unspecified: Secondary | ICD-10-CM

## 2015-03-21 HISTORY — DX: Anemia, unspecified: D64.9

## 2015-03-21 LAB — URINE MICROSCOPIC-ADD ON

## 2015-03-21 LAB — COMPREHENSIVE METABOLIC PANEL
ALT: 23 U/L (ref 17–63)
ANION GAP: 16 — AB (ref 5–15)
AST: 25 U/L (ref 15–41)
Albumin: 3.7 g/dL (ref 3.5–5.0)
Alkaline Phosphatase: 85 U/L (ref 38–126)
BILIRUBIN TOTAL: 0.9 mg/dL (ref 0.3–1.2)
BUN: 117 mg/dL — ABNORMAL HIGH (ref 6–20)
CHLORIDE: 91 mmol/L — AB (ref 101–111)
CO2: 30 mmol/L (ref 22–32)
Calcium: 9.6 mg/dL (ref 8.9–10.3)
Creatinine, Ser: 3.7 mg/dL — ABNORMAL HIGH (ref 0.61–1.24)
GFR calc non Af Amer: 14 mL/min — ABNORMAL LOW (ref >60)
GFR, EST AFRICAN AMERICAN: 16 mL/min — AB (ref >60)
GLUCOSE: 219 mg/dL — AB (ref 65–99)
Potassium: 3.5 mmol/L (ref 3.5–5.1)
SODIUM: 137 mmol/L (ref 135–145)
Total Protein: 6.6 g/dL (ref 6.5–8.1)

## 2015-03-21 LAB — CBC WITH DIFFERENTIAL/PLATELET
BASOS PCT: 0 % (ref 0–1)
Basophils Absolute: 0 10*3/uL (ref 0.0–0.1)
Eosinophils Absolute: 0 10*3/uL (ref 0.0–0.7)
Eosinophils Relative: 0 % (ref 0–5)
HCT: 35.9 % — ABNORMAL LOW (ref 39.0–52.0)
Hemoglobin: 11.9 g/dL — ABNORMAL LOW (ref 13.0–17.0)
Lymphocytes Relative: 7 % — ABNORMAL LOW (ref 12–46)
Lymphs Abs: 1 10*3/uL (ref 0.7–4.0)
MCH: 28.3 pg (ref 26.0–34.0)
MCHC: 33.1 g/dL (ref 30.0–36.0)
MCV: 85.5 fL (ref 78.0–100.0)
Monocytes Absolute: 0.6 10*3/uL (ref 0.1–1.0)
Monocytes Relative: 4 % (ref 3–12)
Neutro Abs: 12.6 10*3/uL — ABNORMAL HIGH (ref 1.7–7.7)
Neutrophils Relative %: 89 % — ABNORMAL HIGH (ref 43–77)
Platelets: 219 10*3/uL (ref 150–400)
RBC: 4.2 MIL/uL — AB (ref 4.22–5.81)
RDW: 16.7 % — AB (ref 11.5–15.5)
WBC: 14.2 10*3/uL — ABNORMAL HIGH (ref 4.0–10.5)

## 2015-03-21 LAB — URINALYSIS, ROUTINE W REFLEX MICROSCOPIC
BILIRUBIN URINE: NEGATIVE
Glucose, UA: NEGATIVE mg/dL
HGB URINE DIPSTICK: NEGATIVE
KETONES UR: NEGATIVE mg/dL
LEUKOCYTES UA: NEGATIVE
Nitrite: NEGATIVE
PH: 5 (ref 5.0–8.0)
Protein, ur: 100 mg/dL — AB
Specific Gravity, Urine: 1.01 (ref 1.005–1.030)
Urobilinogen, UA: 0.2 mg/dL (ref 0.0–1.0)

## 2015-03-21 LAB — GLUCOSE, CAPILLARY: GLUCOSE-CAPILLARY: 199 mg/dL — AB (ref 65–99)

## 2015-03-21 LAB — LACTIC ACID, PLASMA: Lactic Acid, Venous: 1.3 mmol/L (ref 0.5–2.0)

## 2015-03-21 LAB — CBG MONITORING, ED: Glucose-Capillary: 212 mg/dL — ABNORMAL HIGH (ref 65–99)

## 2015-03-21 LAB — I-STAT TROPONIN, ED: Troponin i, poc: 0.02 ng/mL (ref 0.00–0.08)

## 2015-03-21 LAB — LIPASE, BLOOD: Lipase: 27 U/L (ref 22–51)

## 2015-03-21 LAB — TROPONIN I: Troponin I: 0.05 ng/mL — ABNORMAL HIGH (ref <0.031)

## 2015-03-21 MED ORDER — SODIUM CHLORIDE 0.9 % IV SOLN
INTRAVENOUS | Status: DC
  Administered 2015-03-21: 125 mL/h via INTRAVENOUS

## 2015-03-21 MED ORDER — PANTOPRAZOLE SODIUM 40 MG IV SOLR
40.0000 mg | Freq: Two times a day (BID) | INTRAVENOUS | Status: DC
  Administered 2015-03-21 – 2015-03-29 (×16): 40 mg via INTRAVENOUS
  Filled 2015-03-21 (×17): qty 40

## 2015-03-21 MED ORDER — ONDANSETRON HCL 4 MG PO TABS: 4.0000 mg | ORAL_TABLET | Freq: Four times a day (QID) | ORAL | Status: DC | PRN

## 2015-03-21 MED ORDER — MORPHINE SULFATE 4 MG/ML IJ SOLN
2.0000 mg | Freq: Once | INTRAMUSCULAR | Status: AC
  Administered 2015-03-21: 2 mg via INTRAVENOUS
  Filled 2015-03-21: qty 1

## 2015-03-21 MED ORDER — INSULIN ASPART 100 UNIT/ML ~~LOC~~ SOLN
0.0000 [IU] | Freq: Three times a day (TID) | SUBCUTANEOUS | Status: DC
  Administered 2015-03-22: 3 [IU] via SUBCUTANEOUS
  Administered 2015-03-22 – 2015-03-23 (×3): 2 [IU] via SUBCUTANEOUS
  Administered 2015-03-24 (×2): 3 [IU] via SUBCUTANEOUS
  Administered 2015-03-24: 2 [IU] via SUBCUTANEOUS
  Administered 2015-03-25 (×2): 1 [IU] via SUBCUTANEOUS
  Administered 2015-03-26 (×2): 2 [IU] via SUBCUTANEOUS
  Administered 2015-03-27: 3 [IU] via SUBCUTANEOUS
  Administered 2015-03-27: 5 [IU] via SUBCUTANEOUS
  Administered 2015-03-27: 2 [IU] via SUBCUTANEOUS
  Administered 2015-03-28: 3 [IU] via SUBCUTANEOUS
  Administered 2015-03-28: 2 [IU] via SUBCUTANEOUS
  Administered 2015-03-28 – 2015-03-29 (×2): 1 [IU] via SUBCUTANEOUS
  Administered 2015-03-29: 3 [IU] via SUBCUTANEOUS

## 2015-03-21 MED ORDER — HYDRALAZINE HCL 20 MG/ML IJ SOLN
5.0000 mg | INTRAMUSCULAR | Status: DC | PRN
  Administered 2015-03-21 – 2015-03-26 (×5): 5 mg via INTRAVENOUS
  Filled 2015-03-21 (×4): qty 1

## 2015-03-21 MED ORDER — MORPHINE SULFATE 4 MG/ML IJ SOLN
4.0000 mg | Freq: Once | INTRAMUSCULAR | Status: AC
  Administered 2015-03-21: 4 mg via INTRAVENOUS
  Filled 2015-03-21: qty 1

## 2015-03-21 MED ORDER — AMLODIPINE BESYLATE 10 MG PO TABS
10.0000 mg | ORAL_TABLET | Freq: Every morning | ORAL | Status: DC
  Administered 2015-03-22 – 2015-03-29 (×8): 10 mg via ORAL
  Filled 2015-03-21 (×8): qty 1

## 2015-03-21 MED ORDER — ACETAMINOPHEN 650 MG RE SUPP: 650.0000 mg | Freq: Four times a day (QID) | RECTAL | Status: DC | PRN

## 2015-03-21 MED ORDER — HYDRALAZINE HCL 50 MG PO TABS
50.0000 mg | ORAL_TABLET | Freq: Three times a day (TID) | ORAL | Status: DC
  Administered 2015-03-21 – 2015-03-29 (×21): 50 mg via ORAL
  Filled 2015-03-21 (×26): qty 1

## 2015-03-21 MED ORDER — BOOST / RESOURCE BREEZE PO LIQD
1.0000 | Freq: Three times a day (TID) | ORAL | Status: DC
  Administered 2015-03-22 – 2015-03-29 (×13): 1 via ORAL

## 2015-03-21 MED ORDER — ACETAMINOPHEN 325 MG PO TABS: 650.0000 mg | ORAL_TABLET | Freq: Four times a day (QID) | ORAL | Status: DC | PRN

## 2015-03-21 MED ORDER — ENOXAPARIN SODIUM 40 MG/0.4ML ~~LOC~~ SOLN
40.0000 mg | SUBCUTANEOUS | Status: DC
  Filled 2015-03-21: qty 0.4

## 2015-03-21 MED ORDER — ASPIRIN EC 81 MG PO TBEC
81.0000 mg | DELAYED_RELEASE_TABLET | Freq: Every evening | ORAL | Status: DC
  Administered 2015-03-22 – 2015-03-28 (×7): 81 mg via ORAL
  Filled 2015-03-21 (×9): qty 1

## 2015-03-21 MED ORDER — SODIUM CHLORIDE 0.9 % IV SOLN
INTRAVENOUS | Status: AC
  Administered 2015-03-21: 21:00:00 via INTRAVENOUS

## 2015-03-21 MED ORDER — ALLOPURINOL 100 MG PO TABS
100.0000 mg | ORAL_TABLET | Freq: Every day | ORAL | Status: DC
  Administered 2015-03-22 – 2015-03-29 (×7): 100 mg via ORAL
  Filled 2015-03-21 (×8): qty 1

## 2015-03-21 MED ORDER — CLOPIDOGREL BISULFATE 75 MG PO TABS
75.0000 mg | ORAL_TABLET | Freq: Every day | ORAL | Status: DC
  Administered 2015-03-22: 75 mg via ORAL
  Filled 2015-03-21 (×2): qty 1

## 2015-03-21 MED ORDER — TERAZOSIN HCL 5 MG PO CAPS
10.0000 mg | ORAL_CAPSULE | Freq: Every day | ORAL | Status: DC
  Administered 2015-03-21 – 2015-03-28 (×8): 10 mg via ORAL
  Filled 2015-03-21 (×9): qty 2

## 2015-03-21 MED ORDER — ONDANSETRON HCL 4 MG/2ML IJ SOLN
4.0000 mg | Freq: Four times a day (QID) | INTRAMUSCULAR | Status: DC | PRN
  Administered 2015-03-26: 4 mg via INTRAVENOUS
  Filled 2015-03-21: qty 2

## 2015-03-21 MED ORDER — MORPHINE SULFATE 2 MG/ML IJ SOLN
1.0000 mg | INTRAMUSCULAR | Status: DC | PRN
  Administered 2015-03-21 – 2015-03-26 (×2): 1 mg via INTRAVENOUS
  Filled 2015-03-21 (×2): qty 1

## 2015-03-21 MED ORDER — VITAMIN D3 25 MCG (1000 UNIT) PO TABS
1000.0000 [IU] | ORAL_TABLET | Freq: Two times a day (BID) | ORAL | Status: DC
  Administered 2015-03-22 – 2015-03-29 (×14): 1000 [IU] via ORAL
  Filled 2015-03-21 (×16): qty 1

## 2015-03-21 MED ORDER — IOHEXOL 300 MG/ML  SOLN: 25.0000 mL | INTRAMUSCULAR | Status: DC

## 2015-03-21 MED ORDER — SIMVASTATIN 20 MG PO TABS
20.0000 mg | ORAL_TABLET | Freq: Every day | ORAL | Status: DC
  Administered 2015-03-22: 20 mg via ORAL
  Filled 2015-03-21 (×2): qty 1

## 2015-03-21 MED ORDER — CLONIDINE HCL 0.1 MG PO TABS
0.1000 mg | ORAL_TABLET | Freq: Three times a day (TID) | ORAL | Status: DC
  Administered 2015-03-21 – 2015-03-25 (×9): 0.1 mg via ORAL
  Filled 2015-03-21 (×14): qty 1

## 2015-03-21 MED ORDER — CALCITRIOL 0.25 MCG PO CAPS
0.2500 ug | ORAL_CAPSULE | Freq: Every morning | ORAL | Status: DC
  Administered 2015-03-22 – 2015-03-29 (×8): 0.25 ug via ORAL
  Filled 2015-03-21 (×8): qty 1

## 2015-03-21 MED ORDER — LINAGLIPTIN 5 MG PO TABS
5.0000 mg | ORAL_TABLET | Freq: Every day | ORAL | Status: DC
  Administered 2015-03-22 – 2015-03-23 (×2): 5 mg via ORAL
  Filled 2015-03-21 (×3): qty 1

## 2015-03-21 NOTE — ED Notes (Addendum)
Pt states epigastric pain since Sat and weakness since Sun.  Went to renal MD today and was sent here.  Denies nausea or changes in bowel or bladder habits.  Blood pressures in each arm differ widely.

## 2015-03-21 NOTE — ED Provider Notes (Signed)
CSN: [id number]     Arrival date & time 03/21/15  1427 History   First MD Initiated Contact with Patient 03/21/15 1511     Chief Complaint  Patient presents with  . Abdominal Pain     (Consider location/radiation/quality/duration/timing/severity/associated sxs/prior Treatment) Patient is a 81 y.o. male presenting with abdominal pain. The history is provided by the patient and medical records.  Abdominal Pain   This is an 81 y.o. M with PMH significant for HTN, DM, hypercholesterolemia, PVD, CAD s/p stenting of left common iliac in January 2016, CKD stage 3, claudication, presenting to the ED for abdominal pain.  Patient states pain began on Saturday and has been persistent since that time.  Pain is localized to his epigastric/substernal region, described as a dull ache.  He states he has some mild SOB but denies chest pain.  He denies fever, chills, sweats, nausea, or vomiting.  He has had some watery diarrhea.  He also reports generalized weakness for the past 2 days as well.  States his nephrologist started him on diuretics a few months ago and he feels it is "wiping him out".  He has also been started on potassium supplementation.  Prior abdominal surgeries include remote hernia repair. He had a PV angiogram in January of 2016 which did not show evidence of AAA, however there was mention of small ulcerated plaque on left arterial wall.  Past Medical History  Diagnosis Date  . Diabetes mellitus   . Hypertension   . Hypercholesteremia   . Gout   . PVD (peripheral vascular disease) 7/05    LCE, known 60% RICA 7/12  . CAD (coronary artery disease) 2002    non critical  . Macular degeneration   . Chronic renal disease, stage III   . Claudication   . Palpitations     PVCs and PSVT on Holter monitoring  . Bilateral carotid artery disease     status post left carotid endarterectomy performed by Dr. Madilyn FiremanHayes 05/12/04  . Anemia    Past Surgical History  Procedure Laterality Date  . 2d  echocardiogram  03/31/2008    EF greater than 55%  . Cardiovascular stress test  06/30/2010    Nonischemic. Low risk  . Cerebral angiogram  04/03/2004    High-grade 80% ostial L ICA stenosis. Medical management.  . Abdominal aortogram  09/01/2007    Widely patent renal arteries. Medical management.  . Peripheral vascular angiogram  05/07/2011    No evidence of intracranial occlusions, stenosis, dissections, or aneurysms seen  . Carotid doppler  06/09/2012    R Vertebral-known occluded vessel, R Bulb/Proximal ICA-moderate to severe amount of plaque w/50-69% diameter reduction, L Subclavian-50-69% diameter reduction, L CEA-demonstrated increased velocities w/o evidence of hemodynamically significant stenosis  . Cardiac catheterization  10/17/2001    No significant CAD, normal LV systolic function.  . Carotid endarterectomy  July 2005  . Lower extremity angiogram Bilateral 11/01/2014    Procedure: LOWER EXTREMITY ANGIOGRAM;  Surgeon: Runell GessJonathan J Berry, MD;  [facility];  Service: Cardiovascular;  Laterality: Bilateral;  . Abdominal angiogram  11/01/2014    Procedure: ABDOMINAL ANGIOGRAM;  Surgeon: Runell GessJonathan J Berry, MD;  [facility];  Service: Cardiovascular;;   No family history on file. History  Substance Use Topics  . Smoking status: Former Smoker -- 1.00 packs/day for 40 years  . Smokeless tobacco: Never Used     Comment: quit approx. 20 years ago.  . Alcohol Use: No    Review of  Systems  Gastrointestinal: Positive for abdominal pain.  Neurological: Positive for weakness.  All other systems reviewed and are negative.     Allergies  Labetalol  Home Medications   Prior to Admission medications   Medication Sig Start Date End Date Taking? Authorizing Provider  allopurinol (ZYLOPRIM) 100 MG tablet TAKE 1 TABLET (100 MG TOTAL) BY MOUTH AT BEDTIME. 01/27/15   Rhonda G Barrett, PA-C  amLODipine (NORVASC) 10 MG tablet Take 1 tablet by mouth every morning.  05/27/13    Historical Provider, MD  aspirin EC 81 MG tablet Take 81 mg by mouth every evening.    Historical Provider, MD  calcitRIOL (ROCALTROL) 0.25 MCG capsule Take 0.25 mcg by mouth every morning.  05/27/13   Historical Provider, MD  cholecalciferol (VITAMIN D) 1000 UNITS tablet Take 1,000 Units by mouth 2 (two) times daily.     Historical Provider, MD  cloNIDine (CATAPRES) 0.1 MG tablet Take 1 tablet (0.1 mg total) by mouth 3 (three) times daily. 11/04/14   Joline Salt Barrett, PA-C  clopidogrel (PLAVIX) 75 MG tablet TAKE 1 TABLET BY MOUTH DAILY 11/01/14   [name], MD  furosemide (LASIX) 80 MG tablet Take 1 tablet (80 mg total) by mouth daily. HOLD 48 hours, resume on 11/07/2014. 11/04/14   Joline Salt Barrett, PA-C  hydrALAZINE (APRESOLINE) 50 MG tablet Take 1 tablet (50 mg total) by mouth every 8 (eight) hours. 11/04/14   Rhonda G Barrett, PA-C  Multiple Vitamins-Minerals (PRESERVISION AREDS PO) Take 2 tablets by mouth daily.    Historical Provider, MD  simvastatin (ZOCOR) 20 MG tablet Take 1 tablet (20 mg total) by mouth daily at 6 PM. 11/09/14   [name], MD  sitaGLIPtin (JANUVIA) 100 MG tablet Take 100 mg by mouth every morning.     Historical Provider, MD  terazosin (HYTRIN) 10 MG capsule TAKE 1 CAPSULE (10 MG TOTAL) BY MOUTH AT BEDTIME. 11/24/14   [name], MD   BP 162/51 mmHg  Pulse 66  Temp(Src) 97.8 F (36.6 C) (Oral)  Resp 18  Ht  (1.676 m)  Wt 159 lb (72.122 kg)  BMI 25.68 kg/m2  SpO2 94%   Physical Exam  Constitutional: He is oriented to person, place, and time. He appears well-developed and well-nourished.  HENT:  Head: Normocephalic and atraumatic.  Mouth/Throat: Oropharynx is clear and moist.  Eyes: Conjunctivae and EOM are normal. Pupils are equal, round, and reactive to light.  Neck: Normal range of motion.  Cardiovascular: Normal rate and regular rhythm.   Murmur heard. Pulmonary/Chest: Effort normal and breath sounds normal. No respiratory distress. He has  no wheezes.  Abdominal: Soft. Normal appearance and bowel sounds are normal. He exhibits abdominal bruit. There is tenderness in the epigastric area.    Abdomen soft, non-distended, focal tenderness in epigastrium and sub-sternal regions; abdominal bruit noted which is not new  Musculoskeletal: Normal range of motion.  Neurological: He is alert and oriented to person, place, and time.  Skin: Skin is warm and dry.  Psychiatric: He has a normal mood and affect.  Nursing note and vitals reviewed.   ED Course  Procedures (including critical care time) Labs Review Labs Reviewed  CBC WITH DIFFERENTIAL/PLATELET - Abnormal; Notable for the following:    WBC 14.2 (*)    RBC 4.20 (*)    Hemoglobin 11.9 (*)    HCT 35.9 (*)    RDW 16.7 (*)    Neutrophils Relative % 89 (*)    Neutro  Abs 12.6 (*)    Lymphocytes Relative 7 (*)    All other components within normal limits  COMPREHENSIVE METABOLIC PANEL - Abnormal; Notable for the following:    Chloride 91 (*)    Glucose, Bld 219 (*)    BUN 117 (*)    Creatinine, Ser 3.70 (*)    GFR calc non Af Amer 14 (*)    GFR calc Af Amer 16 (*)    Anion gap 16 (*)    All other components within normal limits  URINALYSIS, ROUTINE W REFLEX MICROSCOPIC - Abnormal; Notable for the following:    Protein, ur 100 (*)    All other components within normal limits  URINE MICROSCOPIC-ADD ON - Abnormal; Notable for the following:    Casts HYALINE CASTS (*)    All other components within normal limits  CBG MONITORING, ED - Abnormal; Notable for the following:    Glucose-Capillary 212 (*)    All other components within normal limits  LIPASE, BLOOD  I-STAT TROPOININ, ED    Imaging Review Ct Abdomen Pelvis Wo Contrast  03/21/2015   CLINICAL DATA:  Epigastric abdominal pain since Saturday.  EXAM: CT ABDOMEN AND PELVIS WITHOUT CONTRAST  TECHNIQUE: Multidetector CT imaging of the abdomen and pelvis was performed following the standard protocol without IV contrast.   COMPARISON:  None.  FINDINGS: Lower chest: The lung bases are clear of acute process. There is a small to moderate-sized hiatal hernia containing contrast. Three-vessel coronary artery calcifications are noted.  Hepatobiliary: No focal hepatic lesions or intrahepatic biliary dilatation. A small gallstone is noted in the gallbladder. No common bile duct dilatation.  Pancreas: Normal.  Spleen: Normal size.  No focal lesions.  Adrenals/Urinary Tract: The adrenal glands are normal. Renal pelvic lipomatosis is noted but no renal calculi or obstructing ureteral calculi. No renal mass. No bladder calculi. Mild bladder distention. The prostate gland is mildly enlarged. The seminal vesicles are normal.  Stomach/Bowel: The stomach, duodenum, small bowel and colon are unremarkable. No inflammatory changes, mass lesions or obstructive findings. The terminal ileum is normal. The appendix is normal.  Vascular/Lymphatic: No mesenteric or retroperitoneal mass or adenopathy. Small scattered lymph nodes are noted. There are severe aortic and branch vessel calcifications but no focal aneurysm.  Other: No pelvic mass or adenopathy. No free pelvic fluid collections. No inguinal mass or adenopathy.  Musculoskeletal: No significant bony findings. Moderate degenerative changes involving the spine.  IMPRESSION: 1. Severe atherosclerotic calcifications involving the aorta and branch vessels. 2. Moderate-sized hiatal hernia with reflux of oral contrast into the distal esophagus. 3. Cholelithiasis. 4. No acute abdominal/pelvic findings, mass lesions or adenopathy.   Electronically Signed   By: Rudie Meyer M.D.   On: 03/21/2015 19:07   Dg Chest 2 View  03/21/2015   CLINICAL DATA:  SOB all the time, abdominal pain with weakness x 2 days. Hx of COPD, HTN, diabetes.  EXAM: CHEST  2 VIEW  COMPARISON:  08/27/2012  FINDINGS: The heart size and mediastinal contours are within normal limits. Mild stable scarring at the lung apices. Lungs  otherwise clear. No pleural effusion or pneumothorax. The visualized skeletal structures show no acute findings.  IMPRESSION: No active cardiopulmonary disease.   Electronically Signed   By: Amie Portland M.D.   On: 03/21/2015 16:51     EKG Interpretation   Date/Time:  Monday Mar 21 2015 14:54:30 EDT Ventricular Rate:  70 PR Interval:  158 QRS Duration: 98 QT Interval:  492 QTC Calculation: 531  R Axis:   77 Text Interpretation:  Normal sinus rhythm Left ventricular hypertrophy  with repolarization abnormality Prolonged QT T waves mor prominent when  compared to prior Confirmed by [facility]  MD, BLAIR ([id number]) on 03/21/2015  3:22:52 PM      MDM   Final diagnoses:  Epigastric pain  SOB (shortness of breath)  Hiatal hernia  Chronic kidney disease, stage 3 (moderate)  Dehydration   80 year old male here with abdominal pain and generalized weakness. He denies nausea, vomiting, or diarrhea.  Patient afebrile, nontoxic. He has tenderness in his epigastrium and substernal region without peritoneal signs.  He does have an abdominal bruit, however upon review of his medical record this is not new.  He has no history of AAA and did not have any acute findings on his abdominal arteriogram in January of this year. In triage, patient was noted to have vastly differing blood pressures in his upper extremities, however these were repeated in the room and only very by .  EKG reassuring.  Doubt acute AAA/dissection.  Will obtain lab work, CXR, and CT scan without contrast given his known CKD.  Labwork as above-- troponin within normal limits, leukocytosis of 14K, SrCr and BUN elevated consistent with AKI, likely secondary to dehydration.  CT scan revealing hernia with reflux of oral contrast-- this is likely the source of patient's pain.  He does admit to some recent "gas pains" and states he would feel better if he could belch.  Patient has been given IV fluids.  Given his generalized weakness and  dehydration, feel he would benefit from admission and continued IVF.  Case discussed with hospitalist service who will admit for further management.  Garlon Hatchet, PA-C 03/21/15 2151  Elwin Mocha, MD 03/21/15 2352

## 2015-03-21 NOTE — ED Notes (Signed)
Pt. States pain has increased from a 5 to a 10 in the past 5 minutes. Nurse notified.

## 2015-03-21 NOTE — Progress Notes (Signed)
New Admission Note:   Arrival: From ED on stretcher with tech Mental Orientation: A&Ox4 Telemetry: None ordered Assessment:  See doc flowsheet Skin: Intact IV: Right AC IV Pain: States abdomen is sore, but refuses pain medicine at this time. Safety Measures:  Call bell placed within reach; patient instructed on use of call bell and verbalized understanding. Bed in lowest position.  Wants to keep his socks on.  Bed alarm on. [address] Orientation: Patient oriented to staff, room, and unit. Family: Daughter, Eunice BlaseDebbie, at bedside  Orders have been reviewed and implemented. Will continue to monitor.  Rozann Leschesebecca Ahmod Gillespie, RN, BSN

## 2015-03-21 NOTE — ED Notes (Signed)
Patient transported to X-ray 

## 2015-03-21 NOTE — H&P (Addendum)
[facility] History and Physical  [name] ZOX:[id number] DOB: 1930/02/25 DOA: 03/21/2015  Referring physician: Ms. Misty Stanley. PCP: Lupita Raider, MD  Specialists: Dr. Allyson Sabal. Cardiologist.  Chief Complaint: Abdominal pain.  HPI: [name] is a 81 y.o. male with history of peripheral vascular disease status post left lower extremity stenting, chronic kidney disease stage 3-4, hypertension, hyperlipidemia, asthma presents to the ER because of worsening abdominal pain over the last 4 days. Patient states he developed epigastric pain over the last 4 days which has gradually worsened to the point that he is not able to eat anything and has been having poor appetite. He has had last bowel movement yesterday. In the ER patient had CT abdomen and pelvis which shows cholelithiasis and hiatal hernia with reflux. On exam patient does have epigastric and right upper quadrant tenderness. In addition patient's creatinine has been worsened from baseline. And patient had followed up with his nephrologist yesterday who has discontinued patient's Lasix and Zaroxolyn since patient has lost 30 pounds over the last 6 months. Patient has been admitted for further management. Patient denies any chest pain though he has epigastric discomfort denies any shortness of breath cough or productive sputum or fever chills.   Review of Systems: As presented in the history of presenting illness, rest negative.  Past Medical History  Diagnosis Date  . Diabetes mellitus   . Hypertension   . Hypercholesteremia   . Gout   . PVD (peripheral vascular disease) 7/05    LCE, known 60% RICA 7/12  . CAD (coronary artery disease) 2002    non critical  . Macular degeneration   . Chronic renal disease, stage III   . Claudication   . Palpitations     PVCs and PSVT on Holter monitoring  . Bilateral carotid artery disease     status post left carotid endarterectomy performed by Dr. Madilyn Fireman 05/12/04  . Anemia    Past Surgical  History  Procedure Laterality Date  . 2d echocardiogram  03/31/2008    EF greater than 55%  . Cardiovascular stress test  06/30/2010    Nonischemic. Low risk  . Cerebral angiogram  04/03/2004    High-grade 80% ostial L ICA stenosis. Medical management.  . Abdominal aortogram  09/01/2007    Widely patent renal arteries. Medical management.  . Peripheral vascular angiogram  05/07/2011    No evidence of intracranial occlusions, stenosis, dissections, or aneurysms seen  . Carotid doppler  06/09/2012    R Vertebral-known occluded vessel, R Bulb/Proximal ICA-moderate to severe amount of plaque w/50-69% diameter reduction, L Subclavian-50-69% diameter reduction, L CEA-demonstrated increased velocities w/o evidence of hemodynamically significant stenosis  . Cardiac catheterization  10/17/2001    No significant CAD, normal LV systolic function.  . Carotid endarterectomy  July 2005  . Lower extremity angiogram Bilateral 11/01/2014    Procedure: LOWER EXTREMITY ANGIOGRAM;  Surgeon: [name], MD;  Location: [facility] CATH LAB;  Service: Cardiovascular;  Laterality: Bilateral;  . Abdominal angiogram  11/01/2014    Procedure: ABDOMINAL ANGIOGRAM;  Surgeon: [name], MD;  Location: [facility] CATH LAB;  Service: Cardiovascular;;   Social History:  reports that he has quit smoking. He has never used smokeless tobacco. He reports that he does not drink alcohol. His drug history is not on file. Where does patient live home. Can patient participate in ADLs? Yes.  Allergies  Allergen Reactions  . Labetalol Swelling    angioedema    Family History:  Family History  Problem  Relation Age of Onset  . Stroke Sister       Prior to Admission medications   Medication Sig Start Date End Date Taking? Authorizing Provider  allopurinol (ZYLOPRIM) 100 MG tablet TAKE 1 TABLET (100 MG TOTAL) BY MOUTH AT BEDTIME. 01/27/15  Yes Rhonda G Barrett, PA-C  amLODipine (NORVASC) 10 MG tablet Take 1 tablet by mouth every morning.   05/27/13  Yes Historical Provider, MD  aspirin EC 81 MG tablet Take 81 mg by mouth every evening.   Yes Historical Provider, MD  calcitRIOL (ROCALTROL) 0.25 MCG capsule Take 0.25 mcg by mouth every morning.  05/27/13  Yes Historical Provider, MD  cholecalciferol (VITAMIN D) 1000 UNITS tablet Take 1,000 Units by mouth 2 (two) times daily.    Yes Historical Provider, MD  cloNIDine (CATAPRES) 0.1 MG tablet Take 1 tablet (0.1 mg total) by mouth 3 (three) times daily. 11/04/14  Yes Rhonda G Barrett, PA-C  clopidogrel (PLAVIX) 75 MG tablet TAKE 1 TABLET BY MOUTH DAILY 11/01/14  Yes Runell GessJonathan J Berry, MD  furosemide (LASIX) 80 MG tablet Take 1 tablet (80 mg total) by mouth daily. HOLD 48 hours, resume on 11/07/2014. Patient taking differently: Take 80 mg by mouth daily. Take 2 tablets oral three times daily 11/04/14  Yes Rhonda G Barrett, PA-C  hydrALAZINE (APRESOLINE) 50 MG tablet Take 1 tablet (50 mg total) by mouth every 8 (eight) hours. 11/04/14  Yes Rhonda G Barrett, PA-C  metolazone (ZAROXOLYN) 5 MG tablet Take 5 mg by mouth daily.   Yes Historical Provider, MD  Multiple Vitamins-Minerals (PRESERVISION AREDS PO) Take 2 tablets by mouth daily.   Yes Historical Provider, MD  simvastatin (ZOCOR) 20 MG tablet Take 1 tablet (20 mg total) by mouth daily at 6 PM. 11/09/14  Yes Runell GessJonathan J Berry, MD  sitaGLIPtin (JANUVIA) 100 MG tablet Take 100 mg by mouth every morning. Doctor gave him samples for right now per family member   Yes Historical Provider, MD  terazosin (HYTRIN) 10 MG capsule TAKE 1 CAPSULE (10 MG TOTAL) BY MOUTH AT BEDTIME. 11/24/14  Yes Runell GessJonathan J Berry, MD    Physical Exam: Filed Vitals:   03/21/15 1845 03/21/15 1930 03/21/15 2015 03/21/15 2144  BP: 177/48 189/40 164/42 170/42  Pulse: 70 75 73 74  Temp:    97.9 F (36.6 C)  TempSrc:    Oral  Resp: 16 17 12 16   Height:      Weight:    72.576 kg (160 lb)  SpO2: 91% 94% 90% 96%     General:  Moderately better nourished.  Eyes: Anicteric no  pallor.  ENT: No discharge from the ears eyes nose and mouth.  Neck: No mass felt.  Cardiovascular: S1 and S2 heard.  Respiratory: No rhonchi or crepitations.  Abdomen: Epigastric and right upper quadrant tenderness. No guarding or rigidity.  Skin: No rash.  Musculoskeletal: No edema.  Psychiatric: Appears normal.  Neurologic: Alert awake oriented to time place and person. Moves all extremities.  Labs on Admission:  Basic Metabolic Panel:  Recent Labs Lab 03/21/15 1501  NA 137  K 3.5  CL 91*  CO2 30  GLUCOSE 219*  BUN 117*  CREATININE 3.70*  CALCIUM 9.6   Liver Function Tests:  Recent Labs Lab 03/21/15 1501  AST 25  ALT 23  ALKPHOS 85  BILITOT 0.9  PROT 6.6  ALBUMIN 3.7    Recent Labs Lab 03/21/15 1501  LIPASE 27   No results for input(s): AMMONIA in the last  168 hours. CBC:  Recent Labs Lab 03/16/15 0955 03/21/15 1501  WBC  --  14.2*  NEUTROABS  --  12.6*  HGB 11.0* 11.9*  HCT  --  35.9*  MCV  --  85.5  PLT  --  219   Cardiac Enzymes: No results for input(s): CKTOTAL, CKMB, CKMBINDEX, TROPONINI in the last 168 hours.  BNP (last 3 results) No results for input(s): BNP in the last 8760 hours.  ProBNP (last 3 results) No results for input(s): PROBNP in the last 8760 hours.  CBG:  Recent Labs Lab 03/21/15 2019  GLUCAP 212*    Radiological Exams on Admission: Ct Abdomen Pelvis Wo Contrast  03/21/2015   CLINICAL DATA:  Epigastric abdominal pain since Saturday.  EXAM: CT ABDOMEN AND PELVIS WITHOUT CONTRAST  TECHNIQUE: Multidetector CT imaging of the abdomen and pelvis was performed following the standard protocol without IV contrast.  COMPARISON:  None.  FINDINGS: Lower chest: The lung bases are clear of acute process. There is a small to moderate-sized hiatal hernia containing contrast. Three-vessel coronary artery calcifications are noted.  Hepatobiliary: No focal hepatic lesions or intrahepatic biliary dilatation. A small gallstone is  noted in the gallbladder. No common bile duct dilatation.  Pancreas: Normal.  Spleen: Normal size.  No focal lesions.  Adrenals/Urinary Tract: The adrenal glands are normal. Renal pelvic lipomatosis is noted but no renal calculi or obstructing ureteral calculi. No renal mass. No bladder calculi. Mild bladder distention. The prostate gland is mildly enlarged. The seminal vesicles are normal.  Stomach/Bowel: The stomach, duodenum, small bowel and colon are unremarkable. No inflammatory changes, mass lesions or obstructive findings. The terminal ileum is normal. The appendix is normal.  Vascular/Lymphatic: No mesenteric or retroperitoneal mass or adenopathy. Small scattered lymph nodes are noted. There are severe aortic and branch vessel calcifications but no focal aneurysm.  Other: No pelvic mass or adenopathy. No free pelvic fluid collections. No inguinal mass or adenopathy.  Musculoskeletal: No significant bony findings. Moderate degenerative changes involving the spine.  IMPRESSION: 1. Severe atherosclerotic calcifications involving the aorta and branch vessels. 2. Moderate-sized hiatal hernia with reflux of oral contrast into the distal esophagus. 3. Cholelithiasis. 4. No acute abdominal/pelvic findings, mass lesions or adenopathy.   Electronically Signed   By: Rudie Meyer M.D.   On: 03/21/2015 19:07   Dg Chest 2 View  03/21/2015   CLINICAL DATA:  SOB all the time, abdominal pain with weakness x 2 days. Hx of COPD, HTN, diabetes.  EXAM: CHEST  2 VIEW  COMPARISON:  08/27/2012  FINDINGS: The heart size and mediastinal contours are within normal limits. Mild stable scarring at the lung apices. Lungs otherwise clear. No pleural effusion or pneumothorax. The visualized skeletal structures show no acute findings.  IMPRESSION: No active cardiopulmonary disease.   Electronically Signed   By: Amie Portland M.D.   On: 03/21/2015 16:51    EKG: Independently reviewed. Normal sinus rhythm with LVH and prolonged  QT.  Assessment/Plan Principal Problem:   Abdominal pain Active Problems:   Hypertension   Peripheral arterial disease   Renal failure (ARF), acute on chronic   Diabetes mellitus type 2, controlled   Epigastric pain   1. Epigastric abdominal pain - differentials include gallbladder stones related, peptic ulcer disease and reflux, possible ischemic bowel but CAT scan has been not showing any colitis features. And also we need to cycle cardiac markers to rule out ACS. At this time I place patient on telemetry liquid diet and ordered  HIDA scan to rule out any cholecystitis. Follow LFTs and lipase. Will need GI consult given the patient also had weight loss.. Place patient on PPIs. 2. Acute on chronic renal failure stage IV - at this time holding off patient's Lasix and Zaroxolyn and gently hydrating. Closely follow intake output and metabolic panel. 3. Diabetes mellitus type 2 controlled - patient has been placed on sliding scale coverage. Continue Januvia. 4. Peripheral vascular disease status post stenting continue antiplatelets agents. 5. Hypertension mildly uncontrolled probably from discomfort from the abdomen - patient will be placed on when necessary IV hydralazine and continue home medications. 6. Leukocytosis probably reactionary - follow CBC. 7. Prolonged QT - Avoid drugs which can prolong QT.  Addendum - I have consulted Dr. Marina Goodell on call gastroenterologist.   DVT Prophylaxis Lovenox.  Code Status: Full code.  Family Communication: Patient's daughter.  Disposition Plan: Admit to inpatient. Likely stay 3-4 days.    KAKRAKANDY,ARSHAD N. [facility] Pager [phone].  If 7PM-7AM, please contact night-coverage [email or web address] Password [facility] 03/21/2015, 9:55 PM

## 2015-03-22 ENCOUNTER — Inpatient Hospital Stay (HOSPITAL_COMMUNITY): Payer: Medicare Other

## 2015-03-22 ENCOUNTER — Encounter (HOSPITAL_COMMUNITY): Payer: Self-pay | Admitting: Radiology

## 2015-03-22 DIAGNOSIS — R748 Abnormal levels of other serum enzymes: Secondary | ICD-10-CM | POA: Insufficient documentation

## 2015-03-22 DIAGNOSIS — R778 Other specified abnormalities of plasma proteins: Secondary | ICD-10-CM | POA: Insufficient documentation

## 2015-03-22 DIAGNOSIS — R1010 Upper abdominal pain, unspecified: Secondary | ICD-10-CM

## 2015-03-22 DIAGNOSIS — R1013 Epigastric pain: Secondary | ICD-10-CM

## 2015-03-22 DIAGNOSIS — R109 Unspecified abdominal pain: Secondary | ICD-10-CM

## 2015-03-22 DIAGNOSIS — R7989 Other specified abnormal findings of blood chemistry: Secondary | ICD-10-CM

## 2015-03-22 LAB — LIPASE, BLOOD: Lipase: 25 U/L (ref 22–51)

## 2015-03-22 LAB — HEPATIC FUNCTION PANEL
ALBUMIN: 3.2 g/dL — AB (ref 3.5–5.0)
ALT: 292 U/L — AB (ref 17–63)
AST: 372 U/L — ABNORMAL HIGH (ref 15–41)
Alkaline Phosphatase: 318 U/L — ABNORMAL HIGH (ref 38–126)
BILIRUBIN INDIRECT: 0.8 mg/dL (ref 0.3–0.9)
Bilirubin, Direct: 0.4 mg/dL (ref 0.1–0.5)
Total Bilirubin: 1.2 mg/dL (ref 0.3–1.2)
Total Protein: 5.9 g/dL — ABNORMAL LOW (ref 6.5–8.1)

## 2015-03-22 LAB — BASIC METABOLIC PANEL
Anion gap: 15 (ref 5–15)
BUN: 114 mg/dL — AB (ref 6–20)
CALCIUM: 9.4 mg/dL (ref 8.9–10.3)
CO2: 31 mmol/L (ref 22–32)
Chloride: 92 mmol/L — ABNORMAL LOW (ref 101–111)
Creatinine, Ser: 3.61 mg/dL — ABNORMAL HIGH (ref 0.61–1.24)
GFR, EST AFRICAN AMERICAN: 17 mL/min — AB (ref >60)
GFR, EST NON AFRICAN AMERICAN: 14 mL/min — AB (ref >60)
Glucose, Bld: 182 mg/dL — ABNORMAL HIGH (ref 65–99)
POTASSIUM: 3.4 mmol/L — AB (ref 3.5–5.1)
SODIUM: 138 mmol/L (ref 135–145)

## 2015-03-22 LAB — CBC WITH DIFFERENTIAL/PLATELET
BASOS PCT: 0 % (ref 0–1)
Basophils Absolute: 0 10*3/uL (ref 0.0–0.1)
EOS ABS: 0 10*3/uL (ref 0.0–0.7)
Eosinophils Relative: 0 % (ref 0–5)
HEMATOCRIT: 33.8 % — AB (ref 39.0–52.0)
Hemoglobin: 11 g/dL — ABNORMAL LOW (ref 13.0–17.0)
LYMPHS PCT: 4 % — AB (ref 12–46)
Lymphs Abs: 0.6 10*3/uL — ABNORMAL LOW (ref 0.7–4.0)
MCH: 27.6 pg (ref 26.0–34.0)
MCHC: 32.5 g/dL (ref 30.0–36.0)
MCV: 84.9 fL (ref 78.0–100.0)
Monocytes Absolute: 1.5 10*3/uL — ABNORMAL HIGH (ref 0.1–1.0)
Monocytes Relative: 9 % (ref 3–12)
Neutro Abs: 14 10*3/uL — ABNORMAL HIGH (ref 1.7–7.7)
Neutrophils Relative %: 87 % — ABNORMAL HIGH (ref 43–77)
Platelets: 221 10*3/uL (ref 150–400)
RBC: 3.98 MIL/uL — ABNORMAL LOW (ref 4.22–5.81)
RDW: 16.6 % — ABNORMAL HIGH (ref 11.5–15.5)
WBC: 16.1 10*3/uL — ABNORMAL HIGH (ref 4.0–10.5)

## 2015-03-22 LAB — GLUCOSE, CAPILLARY
GLUCOSE-CAPILLARY: 190 mg/dL — AB (ref 65–99)
GLUCOSE-CAPILLARY: 173 mg/dL — AB (ref 65–99)
Glucose-Capillary: 202 mg/dL — ABNORMAL HIGH (ref 65–99)
Glucose-Capillary: 216 mg/dL — ABNORMAL HIGH (ref 65–99)

## 2015-03-22 LAB — TROPONIN I
Troponin I: 0.07 ng/mL — ABNORMAL HIGH (ref <0.031)
Troponin I: 0.09 ng/mL — ABNORMAL HIGH (ref <0.031)

## 2015-03-22 MED ORDER — ENOXAPARIN SODIUM 30 MG/0.3ML ~~LOC~~ SOLN
30.0000 mg | SUBCUTANEOUS | Status: DC
  Administered 2015-03-22 – 2015-03-24 (×3): 30 mg via SUBCUTANEOUS
  Filled 2015-03-22 (×4): qty 0.3

## 2015-03-22 MED ORDER — POTASSIUM CHLORIDE 10 MEQ/100ML IV SOLN
10.0000 meq | INTRAVENOUS | Status: AC
  Administered 2015-03-22 (×3): 10 meq via INTRAVENOUS
  Filled 2015-03-22 (×3): qty 100

## 2015-03-22 NOTE — Consult Note (Signed)
Consultation  Referring Provider: [facility]     Primary Care Physician:  [name],[name], MD Primary Gastroenterologist:  Gentry FitzUnassigned      Reason for Consultation: Elevated LFTs             HPI:   [name] is a 81 y.o. male who developed acute, non-radiating epigastric pain on Saturday. Pain continued through to Sunday. No chronic GI complaints. Patient has no abdominal pain today.   Past Medical History  Diagnosis Date  . Diabetes mellitus   . Hypertension   . Hypercholesteremia   . Gout   . PVD (peripheral vascular disease) 7/05    LCE, known 60% RICA 7/12  . CAD (coronary artery disease) 2002    non critical  . Macular degeneration   . Chronic renal disease, stage III   . Claudication   . Palpitations     PVCs and PSVT on Holter monitoring  . Bilateral carotid artery disease     status post left carotid endarterectomy performed by Dr. Madilyn FiremanHayes 05/12/04  . Anemia     Past Surgical History  Procedure Laterality Date  . 2d echocardiogram  03/31/2008    EF greater than 55%  . Cardiovascular stress test  06/30/2010    Nonischemic. Low risk  . Cerebral angiogram  04/03/2004    High-grade 80% ostial L ICA stenosis. Medical management.  . Abdominal aortogram  09/01/2007    Widely patent renal arteries. Medical management.  . Peripheral vascular angiogram  05/07/2011    No evidence of intracranial occlusions, stenosis, dissections, or aneurysms seen  . Carotid doppler  06/09/2012    R Vertebral-known occluded vessel, R Bulb/Proximal ICA-moderate to severe amount of plaque w/50-69% diameter reduction, L Subclavian-50-69% diameter reduction, L CEA-demonstrated increased velocities w/o evidence of hemodynamically significant stenosis  . Cardiac catheterization  10/17/2001    No significant CAD, normal LV systolic function.  . Carotid endarterectomy  July 2005  . Lower extremity angiogram Bilateral 11/01/2014    Procedure: LOWER EXTREMITY ANGIOGRAM;  Surgeon: Runell GessJonathan J  Berry, MD;  [facility];  Service: Cardiovascular;  Laterality: Bilateral;  . Abdominal angiogram  11/01/2014    Procedure: ABDOMINAL ANGIOGRAM;  Surgeon: Runell GessJonathan J Berry, MD;  [facility];  Service: Cardiovascular;;    Family History  Problem Relation Age of Onset  . Stroke Sister    No colon cancer  History  Substance Use Topics  . Smoking status: Former Smoker -- 1.00 packs/day for 40 years  . Smokeless tobacco: Never Used     Comment: quit approx. 20 years ago.  . Alcohol Use: No    Prior to Admission medications   Medication Sig Start Date End Date Taking? Authorizing Provider  allopurinol (ZYLOPRIM) 100 MG tablet TAKE 1 TABLET (100 MG TOTAL) BY MOUTH AT BEDTIME. 01/27/15  Yes Rhonda G Barrett, PA-C  amLODipine (NORVASC) 10 MG tablet Take 1 tablet by mouth every morning.  05/27/13  Yes Historical Provider, MD  aspirin EC 81 MG tablet Take 81 mg by mouth every evening.   Yes Historical Provider, MD  calcitRIOL (ROCALTROL) 0.25 MCG capsule Take 0.25 mcg by mouth every morning.  05/27/13  Yes Historical Provider, MD  cholecalciferol (VITAMIN D) 1000 UNITS tablet Take 1,000 Units by mouth 2 (two) times daily.    Yes Historical Provider, MD  cloNIDine (CATAPRES) 0.1 MG tablet Take 1 tablet (0.1 mg total) by mouth 3 (three) times daily. 11/04/14  Yes Joline Salthonda G Barrett, PA-C  clopidogrel (PLAVIX) 75 MG tablet TAKE 1 TABLET BY MOUTH DAILY 11/01/14  Yes [name], MD  furosemide (LASIX) 80 MG tablet Take 1 tablet (80 mg total) by mouth daily. HOLD 48 hours, resume on 11/07/2014. Patient taking differently: Take 80 mg by mouth daily. Take 2 tablets oral three times daily 11/04/14  Yes Rhonda G Barrett, PA-C  hydrALAZINE (APRESOLINE) 50 MG tablet Take 1 tablet (50 mg total) by mouth every 8 (eight) hours. 11/04/14  Yes Rhonda G Barrett, PA-C  metolazone (ZAROXOLYN) 5 MG tablet Take 5 mg by mouth daily.   Yes Historical Provider, MD  Multiple Vitamins-Minerals (PRESERVISION  AREDS PO) Take 2 tablets by mouth daily.   Yes Historical Provider, MD  simvastatin (ZOCOR) 20 MG tablet Take 1 tablet (20 mg total) by mouth daily at 6 PM. 11/09/14  Yes [name], MD  sitaGLIPtin (JANUVIA) 100 MG tablet Take 100 mg by mouth every morning. Doctor gave him samples for right now per family member   Yes Historical Provider, MD  terazosin (HYTRIN) 10 MG capsule TAKE 1 CAPSULE (10 MG TOTAL) BY MOUTH AT BEDTIME. 11/24/14  Yes [name], MD    Current Facility-Administered Medications  Medication Dose Route Frequency Provider Last Rate Last Dose  . 0.9 %  sodium chloride infusion   Intravenous Continuous Eduard Clos, MD 75 mL/hr at 03/21/15 2120    . acetaminophen (TYLENOL) tablet 650 mg  650 mg Oral Q6H PRN Eduard Clos, MD       Or  . acetaminophen (TYLENOL) suppository 650 mg  650 mg Rectal Q6H PRN Eduard Clos, MD      . allopurinol (ZYLOPRIM) tablet 100 mg  100 mg Oral Daily Eduard Clos, MD      . amLODipine (NORVASC) tablet 10 mg  10 mg Oral q morning - 10a Eduard Clos, MD      . aspirin EC tablet 81 mg  81 mg Oral QPM Eduard Clos, MD      . calcitRIOL (ROCALTROL) capsule 0.25 mcg  0.25 mcg Oral q morning - 10a Eduard Clos, MD      . cholecalciferol (VITAMIN D) tablet 1,000 Units  1,000 Units Oral BID Eduard Clos, MD      . cloNIDine (CATAPRES) tablet 0.1 mg  0.1 mg Oral TID Eduard Clos, MD   0.1 mg at 03/21/15 2308  . clopidogrel (PLAVIX) tablet 75 mg  75 mg Oral Daily Eduard Clos, MD      . enoxaparin (LOVENOX) injection 30 mg  30 mg Subcutaneous Q24H Ann Held, [facility]      . feeding supplement (RESOURCE BREEZE) (RESOURCE BREEZE) liquid 1 Container  1 Container Oral TID BM Eduard Clos, MD      . hydrALAZINE (APRESOLINE) injection 5 mg  5 mg Intravenous Q4H PRN Eduard Clos, MD   5 mg at 03/22/15 0532  . hydrALAZINE (APRESOLINE) tablet 50 mg  50 mg Oral 3 times per  day Eduard Clos, MD   50 mg at 03/22/15 0532  . insulin aspart (novoLOG) injection 0-9 Units  0-9 Units Subcutaneous TID WC Eduard Clos, MD   2 Units at 03/22/15 0900  . linagliptin (TRADJENTA) tablet 5 mg  5 mg Oral Daily Eduard Clos, MD      . morphine 2 MG/ML injection 1 mg  1 mg Intravenous Q4H PRN Eduard Clos, MD   1 mg at 03/21/15  2242  . ondansetron (ZOFRAN) tablet 4 mg  4 mg Oral Q6H PRN Eduard Clos, MD       Or  . ondansetron [facility]) injection 4 mg  4 mg Intravenous Q6H PRN Eduard Clos, MD      . pantoprazole (PROTONIX) injection 40 mg  40 mg Intravenous Q12H Eduard Clos, MD   40 mg at 03/21/15 2248  . simvastatin (ZOCOR) tablet 20 mg  20 mg Oral q1800 Eduard Clos, MD      . terazosin (HYTRIN) capsule 10 mg  10 mg Oral QHS Eduard Clos, MD   10 mg at 03/21/15 2249    Allergies as of 03/21/2015 - Review Complete 03/21/2015  Allergen Reaction Noted  . Labetalol Swelling 11/02/2014    Review of Systems:    All systems reviewed and negative except where noted in HPI.   Physical Exam:  Vital signs in last 24 hours: Temp:  [97.6 F (36.4 C)-99.1 F (37.3 C)] 99.1 F (37.3 C) (05/24 0929) Pulse Rate:  [65-81] 81 (05/24 0929) Resp:  [12-22] 18 (05/24 0929) BP: (117-197)/(34-51) 177/44 mmHg (05/24 0929) SpO2:  [90 %-97 %] 96 % (05/24 0929) Weight:  [159 lb (72.122 kg)-160 lb (72.576 kg)] 160 lb (72.576 kg) (05/23 2144) Last BM Date: 03/21/15 General:   Pleasant white male in NAD Head:  Normocephalic and atraumatic. Eyes:   No icterus.   Conjunctiva pink. Ears:  Normal auditory acuity. Neck:  Supple; no masses felt Lungs:  Respirations even and unlabored. Lungs clear to auscultation bilaterally.   No wheezes, crackles, or rhonchi.  Heart:  Regular rate and rhythm; loud murmur heard. Abdomen:  Soft, nondistended, nontender. Normal bowel sounds. No appreciable masses or hepatomegaly.  Rectal:  Not performed.    Msk:  Symmetrical without gross deformities.  Extremities:  Without edema. Neurologic:  Alert and  oriented x4;  grossly normal neurologically. Skin:  Intact without significant lesions or rashes. Cervical Nodes:  No significant cervical adenopathy. Psych:  Alert and cooperative. Normal affect.  LAB RESULTS:  Recent Labs  03/21/15 1501 03/22/15 0422  WBC 14.2* 16.1*  HGB 11.9* 11.0*  HCT 35.9* 33.8*  PLT 219 221   BMET  Recent Labs  03/21/15 1501 03/22/15 0422  NA 137 138  K 3.5 3.4*  CL 91* 92*  CO2 30 31  GLUCOSE 219* 182*  BUN 117* 114*  CREATININE 3.70* 3.61*  CALCIUM 9.6 9.4   LFT  Recent Labs  03/22/15 0422  PROT 5.9*  ALBUMIN 3.2*  AST 372*  ALT 292*  ALKPHOS 318*  BILITOT 1.2  BILIDIR 0.4  IBILI 0.8   STUDIES: Ct Abdomen Pelvis Wo Contrast  03/21/2015   CLINICAL DATA:  Epigastric abdominal pain since Saturday.  EXAM: CT ABDOMEN AND PELVIS WITHOUT CONTRAST  TECHNIQUE: Multidetector CT imaging of the abdomen and pelvis was performed following the standard protocol without IV contrast.  COMPARISON:  None.  FINDINGS: Lower chest: The lung bases are clear of acute process. There is a small to moderate-sized hiatal hernia containing contrast. Three-vessel coronary artery calcifications are noted.  Hepatobiliary: No focal hepatic lesions or intrahepatic biliary dilatation. A small gallstone is noted in the gallbladder. No common bile duct dilatation.  Pancreas: Normal.  Spleen: Normal size.  No focal lesions.  Adrenals/Urinary Tract: The adrenal glands are normal. Renal pelvic lipomatosis is noted but no renal calculi or obstructing ureteral calculi. No renal mass. No bladder calculi. Mild bladder distention. The prostate gland is  mildly enlarged. The seminal vesicles are normal.  Stomach/Bowel: The stomach, duodenum, small bowel and colon are unremarkable. No inflammatory changes, mass lesions or obstructive findings. The terminal ileum is normal. The appendix is  normal.  Vascular/Lymphatic: No mesenteric or retroperitoneal mass or adenopathy. Small scattered lymph nodes are noted. There are severe aortic and branch vessel calcifications but no focal aneurysm.  Other: No pelvic mass or adenopathy. No free pelvic fluid collections. No inguinal mass or adenopathy.  Musculoskeletal: No significant bony findings. Moderate degenerative changes involving the spine.  IMPRESSION: 1. Severe atherosclerotic calcifications involving the aorta and branch vessels. 2. Moderate-sized hiatal hernia with reflux of oral contrast into the distal esophagus. 3. Cholelithiasis. 4. No acute abdominal/pelvic findings, mass lesions or adenopathy.   Electronically Signed   By: Rudie Meyer M.D.   On: 03/21/2015 19:07   Dg Chest 2 View  03/21/2015   CLINICAL DATA:  SOB all the time, abdominal pain with weakness x 2 days. Hx of COPD, HTN, diabetes.  EXAM: CHEST  2 VIEW  COMPARISON:  08/27/2012  FINDINGS: The heart size and mediastinal contours are within normal limits. Mild stable scarring at the lung apices. Lungs otherwise clear. No pleural effusion or pneumothorax. The visualized skeletal structures show no acute findings.  IMPRESSION: No active cardiopulmonary disease.   Electronically Signed   By: Amie Portland M.D.   On: 03/21/2015 16:51   PREVIOUS ENDOSCOPIES:            Remote colonoscopy per daughter   Impression / Plan:   1. Pleasant 81 year old male admitted with acute upper abdominal pain. LFTs normal on admission but became markedly elevated over night. Non-contrast CTscan reveals cholelithiasis but no evidence for acute cholecystitis and no bile duct dilation. Lipase normal. No abdominal pain today, not really tender on exam so may have passed a stone. Will hold off on the HIDA ordered upon admission. Will obtain MRCP (non-contrast given CKD). He may need ERCP depending on MRCP results. Of note, patient on chronic plavix which we will need to work around should ERCP be  necessary. If MRCP negative then will consider proceeding with HIDA.   2. Multiple medical problems not limited to PVD / PAD / HTN / DM / CAD / CKD.   Thanks   LOS: 1 day   Willette Cluster  03/22/2015, 10:01 AM

## 2015-03-22 NOTE — Progress Notes (Signed)
Patient ID: [name], male   DOB: 1930/10/23, 81 y.o.   MRN: [id number] [facility] PROGRESS NOTE  JAHMARI ESBENSHADE NWG:[id number] DOB: 1930-09-12 DOA: 03/21/2015 PCP: Lupita Raider, MD  Brief narrative:    81 y.o. male with past medical history of peripheral vascular disease status post left lower extremity stenting, chronic kidney disease stage 3-4, hypertension, hyperlipidemia, asthma who presented to [facility] ED with worsening intermittent epigastric pain for past few days prior to this admission. Pain is described as gnawing, worse with eating so this led to patient having poor po intake over past day or so. His pain is resolved at this point.   On admission, his BP was 197/45, afebrile and oxygen saturation 90-97% on room air. Blood work was notable for leukocytosis of 14.2, hemoglobin of 11.9, potassium of 3.5, creatinine of 3.7. Of note, per patient he just saw nephrologist who stopped lasix and zaroxolyn due to significant weight loss over past 6 months. CT abdomen and pelvis on this admission showed cholelithiasis and hiatal hernia with reflux. LFT's were WNL on this admission. Lipase was WNL.   His hospital course is slightly complicated wit mild troponin elevation for which reason we consulted cardiology.  Barrier to discharge: due to elevated troponin level we consulted cardiology. GI also involved because of the patient's initial concern for worsening epigastric pain. Plan is for MRCP for further evaluation.    Assessment/Plan:    Principal Problem: Epigastric abdominal pain - In pt with cholelithiasis but no evidence of cholecystitis. LFT's are WNL. Lipase is WNL. - Pt does have leukocytosis but no fevers. - GI has seen the pt in consultation. Plan is for MRCP. - At this time, patient has no reports of abdominal pain. No nausea or vomiting. - May continue supportive care with IV fluids for hydration until PO intake better. - Of note, he is on aspirin and plavix because of  extensive PVD, CAD. Still on both aspirin and plavix unless GI says to hold.   Active Problems: Essential hypertension - Continue Norvasc, clonidine, hydralazine - Order placed for hydralazine PRN if BP above 150/90  Peripheral arterial disease - Continue aspirin and plavix  Renal failure (ARF), acute on chronic, stage 4 - Creatinine 3.7 on this admission - Recent baseline in 10/2014 was 3.09. 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  Family Communication:  plan of care discussed with the patient Disposition Plan: needs MRCP today. Also needs cardio evaluation for mild troponin elevation. Not stable for D/C at this time.  IV access:  Peripheral IV  Procedures and diagnostic studies:    Ct Abdomen Pelvis Wo Contrast 03/21/2015   1. Severe atherosclerotic calcifications involving the aorta and branch vessels. 2. Moderate-sized hiatal hernia with reflux of oral contrast  into the distal esophagus. 3. Cholelithiasis. 4. No acute abdominal/pelvic findings, mass lesions or adenopathy.   Electronically Signed   By: Rudie Meyer M.D.   On: 03/21/2015 19:07   Dg Chest 2 View 03/21/2015    No active cardiopulmonary disease.   Electronically Signed   By: Amie Portland M.D.   On: 03/21/2015 16:51   Medical  Consultants:  Gastroenterology Cardiology   Other Consultants:  None   IAnti-Infectives:   None    Manson Passey, MD  [facility] Pager [phone]  Time spent in minutes: 25 minutes  If 7PM-7AM, please contact night-coverage [email or web address] Password [facility] 03/22/2015, 2:44 PM   LOS: 1 day    HPI/Subjective: No acute overnight events. Patient reports feeling better. No epigastric pain. No nausea or vomiting.   Objective: Filed Vitals:   03/21/15 2144 03/22/15 0440 03/22/15 0705 03/22/15 0929  BP: 170/42 183/34 171/38 177/44  Pulse: 74 75  81  Temp: 97.9 F (36.6 C) 97.6 F (36.4 C)  99.1 F (37.3 C)  TempSrc: Oral Oral  Oral  Resp: Height:      Weight: 72.576 kg (160 lb)     SpO2: 96% 95%  96%    Intake/Output Summary (Last 24 hours) at 03/22/15 1444 Last data filed at 03/22/15 1339  Gross per 24 hour  Intake   1370 ml  Output   2250 ml  Net   -880 ml    Exam:   General:  Pt is alert, follows commands appropriately, not in acute distress  Cardiovascular: Regular rate and rhythm, S1/S2 (+)  Respiratory: Clear to auscultation bilaterally, no wheezing, no crackles, no rhonchi  Abdomen: Soft, non tender, non distended, bowel sounds present  Extremities: No edema, pulses DP and PT palpable bilaterally  Neuro: Grossly nonfocal  Data Reviewed: Basic Metabolic Panel:  Recent Labs Lab 03/21/15 1501 03/22/15 0422  NA 137 138  K 3.5 3.4*  CL 91* 92*  CO2 30 31  GLUCOSE 219* 182*  BUN 117* 114*  CREATININE 3.70* 3.61*  CALCIUM 9.6 9.4   Liver Function Tests:  Recent Labs Lab 03/21/15 1501 03/22/15 0422  AST 25 372*  ALT 23 292*  ALKPHOS 85 318*  BILITOT 0.9 1.2  PROT 6.6 5.9*  ALBUMIN 3.7 3.2*    Recent Labs Lab 03/21/15 1501 03/22/15 0422  LIPASE 27 25   No results for input(s): AMMONIA in the last 168 hours. CBC:  Recent Labs Lab 03/16/15 0955 03/21/15 1501 03/22/15 0422  WBC  --  14.2* 16.1*  NEUTROABS  --   12.6* 14.0*  HGB 11.0* 11.9* 11.0*  HCT  --  35.9* 33.8*  MCV  --  85.5 84.9  PLT  --  219 221   Cardiac Enzymes:  Recent Labs Lab 03/21/15 2238 03/22/15 0422 03/22/15 1108  TROPONINI 0.05* 0.07* 0.09*   BNP: Invalid input(s): POCBNP CBG:  Recent Labs Lab 03/21/15 2019 03/21/15 2217 03/22/15 0758 03/22/15 1144  GLUCAP 212* 199* 190* 173*    No results found for this or any previous visit (from the past 240 hour(s)).   Scheduled Meds: . allopurinol  100 mg Oral Daily  . amLODipine  10 mg Oral q morning - 10a  . aspirin EC  81 mg Oral QPM  . calcitRIOL  0.25 mcg Oral q morning - 10a  . cholecalciferol  1,000 Units Oral BID  . cloNIDine  0.1 mg Oral TID  . clopidogrel  75 mg Oral Daily  .  enoxaparin (LOVENOX) injection  30 mg Subcutaneous Q24H  . feeding supplement (RESOURCE BREEZE)  1 Container Oral TID BM  . hydrALAZINE  50 mg Oral 3 times per day  . insulin aspart  0-9 Units Subcutaneous TID WC  . linagliptin  5 mg Oral Daily  . pantoprazole (PROTONIX) IV  40 mg Intravenous Q12H  . simvastatin  20 mg Oral q1800  . terazosin  10 mg Oral QHS   Continuous Infusions: . sodium chloride 75 mL/hr at 03/21/15 2120

## 2015-03-22 NOTE — Progress Notes (Signed)
MRCP images reviewed.   Patient does have a gallstone but no evidence for CBD dilatation, obstruction or choledocholithiasis Given high likelihood presentation is secondary to gallstone disease, I suspect he passed a gallstone, I have consulted surgery for consideration of cholecystectomy Trend liver enzymes GI is available, call with questions

## 2015-03-22 NOTE — Consult Note (Signed)
Reason for Consult: cholelithiasis  Referring Physician: Dr. Zenovia Jarred    HPI: [name] is a 81 male with a history of DM CKD stage 3-4, HTN, HLD, PVD s/p stenting to LLE, on plavix, CAD, presented with epigastric abdominal pain.  Onset  Was sudden on Saturday while he was helping his neighbor load a Conservation officer, nature.  At this time, it was very mild and persistent.  On Sunday morning, he had a [employer] biscuit and his pain promptly worsened.  Location is epigastric region and without radiation.  Denies fever, chills, sweats.  Denies diarrhea or constipation.  Denies melena.  Reports 30lb weight loss since being placed on "water pill."  He has a history of hernia repair x2.  Last dose of plavix was this afternoon. He had clears which he tolerated well.  He has been pain free since last night.  He denies previous symptoms. On admission, he was noted to have normal LFTs and WBC 14k. AST and ALT increased today.   CT of abdomen and pelvis significant for cholelithiasis.  The was followed by a MRCP which was negative for a CBD stone, but once again demonstrated gallstones.  We have therefore been asked to evaluate.    Past Medical History  Diagnosis Date  . Diabetes mellitus   . Hypertension   . Hypercholesteremia   . Gout   . PVD (peripheral vascular disease) 7/05    LCE, known 60% RICA 7/12  . CAD (coronary artery disease) 2002    non critical  . Macular degeneration   . Chronic renal disease, stage III   . Claudication   . Palpitations     PVCs and PSVT on Holter monitoring  . Bilateral carotid artery disease     status post left carotid endarterectomy performed by Dr. Amedeo Plenty 05/12/04  . Anemia     Past Surgical History  Procedure Laterality Date  . 2d echocardiogram  03/31/2008    EF greater than 55%  . Cardiovascular stress test  06/30/2010    Nonischemic. Low risk  . Cerebral angiogram  04/03/2004    High-grade 80% ostial L ICA stenosis. Medical management.  . Abdominal aortogram   09/01/2007    Widely patent renal arteries. Medical management.  . Peripheral vascular angiogram  05/07/2011    No evidence of intracranial occlusions, stenosis, dissections, or aneurysms seen  . Carotid doppler  06/09/2012    R Vertebral-known occluded vessel, R Bulb/Proximal ICA-moderate to severe amount of plaque w/50-69% diameter reduction, L Subclavian-50-69% diameter reduction, L CEA-demonstrated increased velocities w/o evidence of hemodynamically significant stenosis  . Cardiac catheterization  10/17/2001    No significant CAD, normal LV systolic function.  . Carotid endarterectomy  July 2005  . Lower extremity angiogram Bilateral 11/01/2014    Procedure: LOWER EXTREMITY ANGIOGRAM;  Surgeon: Lorretta Harp, MD;  Location: [facility] CATH LAB;  Service: Cardiovascular;  Laterality: Bilateral;  . Abdominal angiogram  11/01/2014    Procedure: ABDOMINAL ANGIOGRAM;  Surgeon: Lorretta Harp, MD;  Location: [facility] CATH LAB;  Service: Cardiovascular;;    Family History  Problem Relation Age of Onset  . Stroke Sister     Social History:  reports that he has quit smoking. He has never used smokeless tobacco. He reports that he does not drink alcohol. His drug history is not on file.  Allergies:  Allergies  Allergen Reactions  . Labetalol Swelling    angioedema    Medications: Scheduled Meds: . allopurinol  100 mg Oral Daily  .  amLODipine  10 mg Oral q morning - 10a  . aspirin EC  81 mg Oral QPM  . calcitRIOL  0.25 mcg Oral q morning - 10a  . cholecalciferol  1,000 Units Oral BID  . cloNIDine  0.1 mg Oral TID  . clopidogrel  75 mg Oral Daily  . enoxaparin (LOVENOX) injection  30 mg Subcutaneous Q24H  . feeding supplement (RESOURCE BREEZE)  1 Container Oral TID BM  . hydrALAZINE  50 mg Oral 3 times per day  . insulin aspart  0-9 Units Subcutaneous TID WC  . linagliptin  5 mg Oral Daily  . pantoprazole (PROTONIX) IV  40 mg Intravenous Q12H  . potassium chloride  10 mEq Intravenous Q1 Hr x 3   . simvastatin  20 mg Oral q1800  . terazosin  10 mg Oral QHS   Continuous Infusions: . sodium chloride 75 mL/hr at 03/21/15 2120   PRN Meds:.acetaminophen **OR** acetaminophen, hydrALAZINE, morphine injection, ondansetron **OR** ondansetron (ZOFRAN) IV   Results for orders placed or performed during the hospital encounter of 03/21/15 (from the past 48 hour(s))  CBC with Differential     Status: Abnormal   Collection Time: 03/21/15  3:01 PM  Result Value Ref Range   WBC 14.2 (H) 4.0 - 10.5 K/uL   RBC 4.20 (L) 4.22 - 5.81 MIL/uL   Hemoglobin 11.9 (L) 13.0 - 17.0 g/dL   HCT 35.9 (L) 39.0 - 52.0 %   MCV 85.5 78.0 - 100.0 fL   MCH 28.3 26.0 - 34.0 pg   MCHC 33.1 30.0 - 36.0 g/dL   RDW 16.7 (H) 11.5 - 15.5 %   Platelets 219 150 - 400 K/uL   Neutrophils Relative % 89 (H) 43 - 77 %   Neutro Abs 12.6 (H) 1.7 - 7.7 K/uL   Lymphocytes Relative 7 (L) 12 - 46 %   Lymphs Abs 1.0 0.7 - 4.0 K/uL   Monocytes Relative 4 3 - 12 %   Monocytes Absolute 0.6 0.1 - 1.0 K/uL   Eosinophils Relative 0 0 - 5 %   Eosinophils Absolute 0.0 0.0 - 0.7 K/uL   Basophils Relative 0 0 - 1 %   Basophils Absolute 0.0 0.0 - 0.1 K/uL  Comprehensive metabolic panel     Status: Abnormal   Collection Time: 03/21/15  3:01 PM  Result Value Ref Range   Sodium 137 135 - 145 mmol/L   Potassium 3.5 3.5 - 5.1 mmol/L   Chloride 91 (L) 101 - 111 mmol/L   CO2 30 22 - 32 mmol/L   Glucose, Bld 219 (H) 65 - 99 mg/dL   BUN 117 (H) 6 - 20 mg/dL   Creatinine, Ser 3.70 (H) 0.61 - 1.24 mg/dL   Calcium 9.6 8.9 - 10.3 mg/dL   Total Protein 6.6 6.5 - 8.1 g/dL   Albumin 3.7 3.5 - 5.0 g/dL   AST 25 15 - 41 U/L   ALT 23 17 - 63 U/L   Alkaline Phosphatase 85 38 - 126 U/L   Total Bilirubin 0.9 0.3 - 1.2 mg/dL   GFR calc non Af Amer 14 (L) >60 mL/min   GFR calc Af Amer 16 (L) >60 mL/min    Comment: (NOTE) The eGFR has been calculated using the CKD EPI equation. This calculation has not been validated in all clinical  situations. eGFR's persistently <60 mL/min signify possible Chronic Kidney Disease.    Anion gap 16 (H) 5 - 15  Lipase, blood     Status:  None   Collection Time: 03/21/15  3:01 PM  Result Value Ref Range   Lipase 27 22 - 51 U/L  I-stat troponin, ED (only if pt is 79 y.o. or older & pain is above umbilicus)  not at [facility]     Status: None   Collection Time: 03/21/15  3:12 PM  Result Value Ref Range   Troponin i, poc 0.02 0.00 - 0.08 ng/mL   Comment 3            Comment: Due to the release kinetics of cTnI, a negative result within the first hours of the onset of symptoms does not rule out myocardial infarction with certainty. If myocardial infarction is still suspected, repeat the test at appropriate intervals.   Urinalysis, Routine w reflex microscopic     Status: Abnormal   Collection Time: 03/21/15  4:00 PM  Result Value Ref Range   Color, Urine YELLOW YELLOW   APPearance CLEAR CLEAR   Specific Gravity, Urine 1.010 1.005 - 1.030   pH 5.0 5.0 - 8.0   Glucose, UA NEGATIVE NEGATIVE mg/dL   Hgb urine dipstick NEGATIVE NEGATIVE   Bilirubin Urine NEGATIVE NEGATIVE   Ketones, ur NEGATIVE NEGATIVE mg/dL   Protein, ur 100 (A) NEGATIVE mg/dL   Urobilinogen, UA 0.2 0.0 - 1.0 mg/dL   Nitrite NEGATIVE NEGATIVE   Leukocytes, UA NEGATIVE NEGATIVE  Urine microscopic-add on     Status: Abnormal   Collection Time: 03/21/15  4:00 PM  Result Value Ref Range   Squamous Epithelial / LPF RARE RARE   WBC, UA 0-2 <3 WBC/hpf   RBC / HPF 0-2 <3 RBC/hpf   Casts HYALINE CASTS (A) NEGATIVE    Comment: GRANULAR CAST  CBG monitoring, ED     Status: Abnormal   Collection Time: 03/21/15  8:19 PM  Result Value Ref Range   Glucose-Capillary 212 (H) 65 - 99 mg/dL  Glucose, capillary     Status: Abnormal   Collection Time: 03/21/15 10:17 PM  Result Value Ref Range   Glucose-Capillary 199 (H) 65 - 99 mg/dL  Troponin I (q 6hr x 3)     Status: Abnormal   Collection Time: 03/21/15 10:38 PM  Result  Value Ref Range   Troponin I 0.05 (H) <0.031 ng/mL    Comment:        PERSISTENTLY INCREASED TROPONIN VALUES IN THE RANGE OF 0.04-0.49 ng/mL CAN BE SEEN IN:       -UNSTABLE ANGINA       -CONGESTIVE HEART FAILURE       -MYOCARDITIS       -CHEST TRAUMA       -ARRYHTHMIAS       -LATE PRESENTING MYOCARDIAL INFARCTION       -COPD   CLINICAL FOLLOW-UP RECOMMENDED.   Lactic acid, plasma     Status: None   Collection Time: 03/21/15 10:38 PM  Result Value Ref Range   Lactic Acid, Venous 1.3 0.5 - 2.0 mmol/L  Troponin I (q 6hr x 3)     Status: Abnormal   Collection Time: 03/22/15  4:22 AM  Result Value Ref Range   Troponin I 0.07 (H) <0.031 ng/mL    Comment:        PERSISTENTLY INCREASED TROPONIN VALUES IN THE RANGE OF 0.04-0.49 ng/mL CAN BE SEEN IN:       -UNSTABLE ANGINA       -CONGESTIVE HEART FAILURE       -MYOCARDITIS       -CHEST  TRAUMA       -ARRYHTHMIAS       -LATE PRESENTING MYOCARDIAL INFARCTION       -COPD   CLINICAL FOLLOW-UP RECOMMENDED.   Hepatic function panel     Status: Abnormal   Collection Time: 03/22/15  4:22 AM  Result Value Ref Range   Total Protein 5.9 (L) 6.5 - 8.1 g/dL   Albumin 3.2 (L) 3.5 - 5.0 g/dL   AST 372 (H) 15 - 41 U/L   ALT 292 (H) 17 - 63 U/L   Alkaline Phosphatase 318 (H) 38 - 126 U/L   Total Bilirubin 1.2 0.3 - 1.2 mg/dL   Bilirubin, Direct 0.4 0.1 - 0.5 mg/dL   Indirect Bilirubin 0.8 0.3 - 0.9 mg/dL  Lipase, blood     Status: None   Collection Time: 03/22/15  4:22 AM  Result Value Ref Range   Lipase 25 22 - 51 U/L  CBC with Differential/Platelet     Status: Abnormal   Collection Time: 03/22/15  4:22 AM  Result Value Ref Range   WBC 16.1 (H) 4.0 - 10.5 K/uL   RBC 3.98 (L) 4.22 - 5.81 MIL/uL   Hemoglobin 11.0 (L) 13.0 - 17.0 g/dL   HCT 33.8 (L) 39.0 - 52.0 %   MCV 84.9 78.0 - 100.0 fL   MCH 27.6 26.0 - 34.0 pg   MCHC 32.5 30.0 - 36.0 g/dL   RDW 16.6 (H) 11.5 - 15.5 %   Platelets 221 150 - 400 K/uL   Neutrophils Relative % 87 (H)  43 - 77 %   Neutro Abs 14.0 (H) 1.7 - 7.7 K/uL   Lymphocytes Relative 4 (L) 12 - 46 %   Lymphs Abs 0.6 (L) 0.7 - 4.0 K/uL   Monocytes Relative 9 3 - 12 %   Monocytes Absolute 1.5 (H) 0.1 - 1.0 K/uL   Eosinophils Relative 0 0 - 5 %   Eosinophils Absolute 0.0 0.0 - 0.7 K/uL   Basophils Relative 0 0 - 1 %   Basophils Absolute 0.0 0.0 - 0.1 K/uL  Basic metabolic panel     Status: Abnormal   Collection Time: 03/22/15  4:22 AM  Result Value Ref Range   Sodium 138 135 - 145 mmol/L   Potassium 3.4 (L) 3.5 - 5.1 mmol/L   Chloride 92 (L) 101 - 111 mmol/L   CO2 31 22 - 32 mmol/L   Glucose, Bld 182 (H) 65 - 99 mg/dL   BUN 114 (H) 6 - 20 mg/dL   Creatinine, Ser 3.61 (H) 0.61 - 1.24 mg/dL   Calcium 9.4 8.9 - 10.3 mg/dL   GFR calc non Af Amer 14 (L) >60 mL/min   GFR calc Af Amer 17 (L) >60 mL/min    Comment: (NOTE) The eGFR has been calculated using the CKD EPI equation. This calculation has not been validated in all clinical situations. eGFR's persistently <60 mL/min signify possible Chronic Kidney Disease.    Anion gap 15 5 - 15  Glucose, capillary     Status: Abnormal   Collection Time: 03/22/15  7:58 AM  Result Value Ref Range   Glucose-Capillary 190 (H) 65 - 99 mg/dL  Troponin I (q 6hr x 3)     Status: Abnormal   Collection Time: 03/22/15 11:08 AM  Result Value Ref Range   Troponin I 0.09 (H) <0.031 ng/mL    Comment:        PERSISTENTLY INCREASED TROPONIN VALUES IN THE RANGE OF 0.04-0.49 ng/mL CAN BE  SEEN IN:       -UNSTABLE ANGINA       -CONGESTIVE HEART FAILURE       -MYOCARDITIS       -CHEST TRAUMA       -ARRYHTHMIAS       -LATE PRESENTING MYOCARDIAL INFARCTION       -COPD   CLINICAL FOLLOW-UP RECOMMENDED.   Glucose, capillary     Status: Abnormal   Collection Time: 03/22/15 11:44 AM  Result Value Ref Range   Glucose-Capillary 173 (H) 65 - 99 mg/dL    Ct Abdomen Pelvis Wo Contrast  03/21/2015   CLINICAL DATA:  Epigastric abdominal pain since Saturday.  EXAM: CT  ABDOMEN AND PELVIS WITHOUT CONTRAST  TECHNIQUE: Multidetector CT imaging of the abdomen and pelvis was performed following the standard protocol without IV contrast.  COMPARISON:  None.  FINDINGS: Lower chest: The lung bases are clear of acute process. There is a small to moderate-sized hiatal hernia containing contrast. Three-vessel coronary artery calcifications are noted.  Hepatobiliary: No focal hepatic lesions or intrahepatic biliary dilatation. A small gallstone is noted in the gallbladder. No common bile duct dilatation.  Pancreas: Normal.  Spleen: Normal size.  No focal lesions.  Adrenals/Urinary Tract: The adrenal glands are normal. Renal pelvic lipomatosis is noted but no renal calculi or obstructing ureteral calculi. No renal mass. No bladder calculi. Mild bladder distention. The prostate gland is mildly enlarged. The seminal vesicles are normal.  Stomach/Bowel: The stomach, duodenum, small bowel and colon are unremarkable. No inflammatory changes, mass lesions or obstructive findings. The terminal ileum is normal. The appendix is normal.  Vascular/Lymphatic: No mesenteric or retroperitoneal mass or adenopathy. Small scattered lymph nodes are noted. There are severe aortic and branch vessel calcifications but no focal aneurysm.  Other: No pelvic mass or adenopathy. No free pelvic fluid collections. No inguinal mass or adenopathy.  Musculoskeletal: No significant bony findings. Moderate degenerative changes involving the spine.  IMPRESSION: 1. Severe atherosclerotic calcifications involving the aorta and branch vessels. 2. Moderate-sized hiatal hernia with reflux of oral contrast into the distal esophagus. 3. Cholelithiasis. 4. No acute abdominal/pelvic findings, mass lesions or adenopathy.   Electronically Signed   By: Marijo Sanes M.D.   On: 03/21/2015 19:07   Dg Chest 2 View  03/21/2015   CLINICAL DATA:  SOB all the time, abdominal pain with weakness x 2 days. Hx of COPD, HTN, diabetes.  EXAM: CHEST   2 VIEW  COMPARISON:  08/27/2012  FINDINGS: The heart size and mediastinal contours are within normal limits. Mild stable scarring at the lung apices. Lungs otherwise clear. No pleural effusion or pneumothorax. The visualized skeletal structures show no acute findings.  IMPRESSION: No active cardiopulmonary disease.   Electronically Signed   By: Lajean Manes M.D.   On: 03/21/2015 16:51   Mr Abdomen Mrcp Wo Cm  03/22/2015   CLINICAL DATA:  Abnormal liver function tests.  Gallstones.  EXAM: MRI ABDOMEN WITHOUT CONTRAST  (INCLUDING MRCP)  TECHNIQUE: Multiplanar multisequence MR imaging of the abdomen was performed. Heavily T2-weighted images of the biliary and pancreatic ducts were obtained, and three-dimensional MRCP images were rendered by post processing.  COMPARISON:  03/21/2015  FINDINGS: Lower chest: There is no pleural effusion identified. No pericardial effusion.  Hepatobiliary: No focal liver abnormality identified. There is diffuse low signal on the T1 and T2 weighted sequences suggesting diffuse iron deposition. Small stone within the dependent portion of the gallbladder measures 5 mm. No gallbladder wall thickening or pericholecystic  fluid. No biliary dilatation or evidence of choledocholithiasis.  Pancreas: There is no focal pancreas abnormality.  Spleen: No focal spleen abnormality.  Adrenals/Urinary Tract: The adrenal glands are both normal. There is bilateral and symmetric renal cortical thinning. No obstructive uropathy or mass noted.  Stomach/Bowel: The stomach and the small bowel loops are on unremarkable. The visualized portions of the colon are within normal limits.  Vascular/Lymphatic: Normal appearance of the abdominal aorta. No upper abdominal adenopathy noted.  Other: Insert ascites stress set no free fluid or fluid collections identified.  Musculoskeletal: There is diffuse low signal throughout the bone marrow.  IMPRESSION: 1. No acute findings. 2. Gallstone. No evidence for  choledocholithiasis or biliary obstruction. 3. Diffuse low signal within the liver, spleen and bone marrow which may be seen with abnormal iron deposition.   Electronically Signed   By: Kerby Moors M.D.   On: 03/22/2015 15:43   Mr 3d Recon At Scanner  03/22/2015   CLINICAL DATA:  Abnormal liver function tests.  Gallstones.  EXAM: MRI ABDOMEN WITHOUT CONTRAST  (INCLUDING MRCP)  TECHNIQUE: Multiplanar multisequence MR imaging of the abdomen was performed. Heavily T2-weighted images of the biliary and pancreatic ducts were obtained, and three-dimensional MRCP images were rendered by post processing.  COMPARISON:  03/21/2015  FINDINGS: Lower chest: There is no pleural effusion identified. No pericardial effusion.  Hepatobiliary: No focal liver abnormality identified. There is diffuse low signal on the T1 and T2 weighted sequences suggesting diffuse iron deposition. Small stone within the dependent portion of the gallbladder measures 5 mm. No gallbladder wall thickening or pericholecystic fluid. No biliary dilatation or evidence of choledocholithiasis.  Pancreas: There is no focal pancreas abnormality.  Spleen: No focal spleen abnormality.  Adrenals/Urinary Tract: The adrenal glands are both normal. There is bilateral and symmetric renal cortical thinning. No obstructive uropathy or mass noted.  Stomach/Bowel: The stomach and the small bowel loops are on unremarkable. The visualized portions of the colon are within normal limits.  Vascular/Lymphatic: Normal appearance of the abdominal aorta. No upper abdominal adenopathy noted.  Other: Insert ascites stress set no free fluid or fluid collections identified.  Musculoskeletal: There is diffuse low signal throughout the bone marrow.  IMPRESSION: 1. No acute findings. 2. Gallstone. No evidence for choledocholithiasis or biliary obstruction. 3. Diffuse low signal within the liver, spleen and bone marrow which may be seen with abnormal iron deposition.   Electronically  Signed   By: Kerby Moors M.D.   On: 03/22/2015 15:43    Review of Systems  All other systems reviewed and are negative.  Blood pressure 177/44, pulse 81, temperature 99.1 F (37.3 C), temperature source Oral, resp. rate 18, height 5' 6"  (1.676 m), weight 72.576 kg (160 lb), SpO2 96 %. Physical Exam  Constitutional: He is oriented to person, place, and time. He appears well-developed and well-nourished. No distress.  Cardiovascular: Normal rate, regular rhythm, normal heart sounds and intact distal pulses.  Exam reveals no gallop and no friction rub.   No murmur heard. Respiratory: Effort normal and breath sounds normal. No respiratory distress. He has no wheezes. He has no rales. He exhibits no tenderness.  GI: Soft. He exhibits no distension and no mass. There is no tenderness. There is no rebound and no guarding.  TTP to epigastric region, rectus diastasis  Musculoskeletal: Normal range of motion. He exhibits no edema or tenderness.  Neurological: He is alert and oriented to person, place, and time.  Skin: Skin is warm and dry. No  rash noted. He is not diaphoretic. No erythema. No pallor.  Psychiatric: He has a normal mood and affect. His behavior is normal. Judgment and thought content normal.    Assessment/Plan: Cholelithiasis possible cholecystitis  Likely a passed CBD stone  PVD s/p stent to left common iliac 10/2014 on Plavix and ASA CKD HTN DM II CAD  We discussed surgical options in detail with the patient and his daughter.  The patient would like to avoid surgery if at all possible.  Therefore we will proceed with a HIDA a scan. If positive for cholecystitis, will proceed with a cholecystostomy tube by IR.  May have clears, NPO after midnight  Hold Plavix Thank you for the consult   Judah Chevere ANP-BC 03/22/2015, 4:16 PM

## 2015-03-22 NOTE — Progress Notes (Signed)
Nutrition Brief Note  Patient identified on the Malnutrition Screening Tool (MST) Report  Wt Readings from Last 15 Encounters:  03/21/15 160 lb (72.576 kg)  03/16/15 157 lb (71.215 kg)  03/02/15 158 lb (71.668 kg)  11/12/14 171 lb (77.565 kg)  11/04/14 184 lb 3.2 oz (83.553 kg)  09/22/14 169 lb (76.658 kg)  09/15/14 163 lb (73.936 kg)  09/08/14 162 lb (73.483 kg)  08/23/14 168 lb 12.8 oz (76.567 kg)  06/17/14 165 lb (74.844 kg)  06/04/14 165 lb 1.6 oz (74.889 kg)  09/02/13 180 lb 12.8 oz (82.01 kg)  07/29/13 179 lb (81.194 kg)  06/17/13 179 lb 6.4 oz (81.375 kg)    Body mass index is 25.84 kg/(m^2). Patient meets criteria for overweight based on current BMI. Weight loss due to fluids.   Current diet order is clear liquids, however has been NPO due to procedure today. Pt reports appetite is good currently and PTA consuming 3-4 meals a day with no other difficulties. Pt currently has [employer]reeze ordered and would like to continue with them. Labs and medications reviewed.   No further nutrition interventions warranted at this time. If nutrition issues arise, please consult RD.   Marijean NiemannStephanie La, MS, RD, LDN Pager # [phone] After hours/ weekend pager # [phone]

## 2015-03-22 NOTE — Consult Note (Signed)
CARDIOLOGY CONSULT NOTE   Patient ID: [name] MRN: [id number] DOB/AGE: Feb 27, 1930 81 y.o.  Admit date: 03/21/2015  Primary Physician   Mayra Neer, MD Primary Cardiologist   Dr. Gwenlyn Found Reason for Consultation Elevated troponin  HPI: [name] is a 81 y.o. male with a history of noncritical CAD, peripheral vascular disease s/p successful PTA and stenting of a physiologically significant distal left common iliac artery stenosis , CKD stage 4, HTN, HL, DM and asthma presented to the ER 03/21/15 with worsening intermittent epigastric pain for past few days prior to this admission.   Cath performed by Dr. Alla German December 2002 that showed 50-60% scattered stenosis of ramus intermedius, 60% first OM, 40% proximal RCA and LV EF of 50%. 2D echo 2009 showed EF of 55% and mild concentric LVH. He had a low risk Myoview 06/2010.  He presented to office 05/2014 complaining of nocturnal palpitations. 48 Hour Holter monitor showed sinus rhythm with occasional PVCs and runs of PSVT and at that time declined BB therapy. He had moderate carotid artery disease, followed by Dopplers. He was recently admitted to hospital for peripheral angiogram from 11/01/14 to 11/04/14. He developed renal  Failure post angiogram. Last carotid duplex 01/07/2015 showed greater than 70% diameter reduction of right bulb/proximal ICA, 50-69% diameter reduction of bilateral subclavian artery and equal to greater than 50% diameter reduction of left CEA. When compared to prior study 06/2014 there are no significant changes noted. He missed appointment with Dr. Gwenlyn Found 12/14/2014.  In the ER patient had CT abdomen and pelvis which shows cholelithiasis and hiatal hernia with reflux.  In addition patient's creatinine noted to be worsened from baseline. His nephrologist discontinued 5/22 Lasix and Zaroxolyn since patient has lost 30 pounds over the last 6 months.  Patient has been admitted for further management. LFT and Lipas was WNL. Plan  is for MRCP for further evaluation. His tele showed sinus rhythm with occasional PVCs and ventricular bigeminy. CXR showed no active cardiopulmonary disease. EKG showed Normal sinus rhythm with rate of 70 bpm, LVH and prolonged QT of 492. Trop trend is 0.05->0.07->0.09.  Patient denies any chest pain, shortness of breath, cough, palpitation, PND, orthopnea, claudications, abdominal pain or syncope.   Past Medical History  Diagnosis Date  . Diabetes mellitus   . Hypertension   . Hypercholesteremia   . Gout   . PVD (peripheral vascular disease) 7/05    LCE, known 60% RICA 7/12  . CAD (coronary artery disease) 2002    non critical  . Macular degeneration   . Chronic renal disease, stage III   . Claudication   . Palpitations     PVCs and PSVT on Holter monitoring  . Bilateral carotid artery disease     status post left carotid endarterectomy performed by Dr. Amedeo Plenty 05/12/04  . Anemia      Past Surgical History  Procedure Laterality Date  . 2d echocardiogram  03/31/2008    EF greater than 55%  . Cardiovascular stress test  06/30/2010    Nonischemic. Low risk  . Cerebral angiogram  04/03/2004    High-grade 80% ostial L ICA stenosis. Medical management.  . Abdominal aortogram  09/01/2007    Widely patent renal arteries. Medical management.  . Peripheral vascular angiogram  05/07/2011    No evidence of intracranial occlusions, stenosis, dissections, or aneurysms seen  . Carotid doppler  06/09/2012    R Vertebral-known occluded vessel, R Bulb/Proximal ICA-moderate to severe amount of plaque w/50-69% diameter reduction,  L Subclavian-50-69% diameter reduction, L CEA-demonstrated increased velocities w/o evidence of hemodynamically significant stenosis  . Cardiac catheterization  10/17/2001    No significant CAD, normal LV systolic function.  . Carotid endarterectomy  July 2005  . Lower extremity angiogram Bilateral 11/01/2014    Procedure: LOWER EXTREMITY ANGIOGRAM;  Surgeon: Lorretta Harp,  MD;  Location: [facility] CATH LAB;  Service: Cardiovascular;  Laterality: Bilateral;  . Abdominal angiogram  11/01/2014    Procedure: ABDOMINAL ANGIOGRAM;  Surgeon: Lorretta Harp, MD;  Location: [facility] CATH LAB;  Service: Cardiovascular;;    Allergies  Allergen Reactions  . Labetalol Swelling    angioedema    I have reviewed the patient's current medications . allopurinol  100 mg Oral Daily  . amLODipine  10 mg Oral q morning - 10a  . aspirin EC  81 mg Oral QPM  . calcitRIOL  0.25 mcg Oral q morning - 10a  . cholecalciferol  1,000 Units Oral BID  . cloNIDine  0.1 mg Oral TID  . clopidogrel  75 mg Oral Daily  . enoxaparin (LOVENOX) injection  30 mg Subcutaneous Q24H  . feeding supplement (RESOURCE BREEZE)  1 Container Oral TID BM  . hydrALAZINE  50 mg Oral 3 times per day  . insulin aspart  0-9 Units Subcutaneous TID WC  . linagliptin  5 mg Oral Daily  . pantoprazole (PROTONIX) IV  40 mg Intravenous Q12H  . simvastatin  20 mg Oral q1800  . terazosin  10 mg Oral QHS   . sodium chloride 75 mL/hr at 03/21/15 2120   acetaminophen **OR** acetaminophen, hydrALAZINE, morphine injection, ondansetron **OR** ondansetron (ZOFRAN) IV  Prior to Admission medications   Medication Sig Start Date End Date Taking? Authorizing Provider  allopurinol (ZYLOPRIM) 100 MG tablet TAKE 1 TABLET (100 MG TOTAL) BY MOUTH AT BEDTIME. 01/27/15  Yes Rhonda G Barrett, PA-C  amLODipine (NORVASC) 10 MG tablet Take 1 tablet by mouth every morning.  05/27/13  Yes Historical Provider, MD  aspirin EC 81 MG tablet Take 81 mg by mouth every evening.   Yes Historical Provider, MD  calcitRIOL (ROCALTROL) 0.25 MCG capsule Take 0.25 mcg by mouth every morning.  05/27/13  Yes Historical Provider, MD  cholecalciferol (VITAMIN D) 1000 UNITS tablet Take 1,000 Units by mouth 2 (two) times daily.    Yes Historical Provider, MD  cloNIDine (CATAPRES) 0.1 MG tablet Take 1 tablet (0.1 mg total) by mouth 3 (three) times daily. 11/04/14  Yes Rhonda  G Barrett, PA-C  clopidogrel (PLAVIX) 75 MG tablet TAKE 1 TABLET BY MOUTH DAILY 11/01/14  Yes Lorretta Harp, MD  furosemide (LASIX) 80 MG tablet Take 1 tablet (80 mg total) by mouth daily. HOLD 48 hours, resume on 11/07/2014. Patient taking differently: Take 80 mg by mouth daily. Take 2 tablets oral three times daily 11/04/14  Yes Rhonda G Barrett, PA-C  hydrALAZINE (APRESOLINE) 50 MG tablet Take 1 tablet (50 mg total) by mouth every 8 (eight) hours. 11/04/14  Yes Rhonda G Barrett, PA-C  metolazone (ZAROXOLYN) 5 MG tablet Take 5 mg by mouth daily.   Yes Historical Provider, MD  Multiple Vitamins-Minerals (PRESERVISION AREDS PO) Take 2 tablets by mouth daily.   Yes Historical Provider, MD  simvastatin (ZOCOR) 20 MG tablet Take 1 tablet (20 mg total) by mouth daily at 6 PM. 11/09/14  Yes Lorretta Harp, MD  sitaGLIPtin (JANUVIA) 100 MG tablet Take 100 mg by mouth every morning. Doctor gave him samples for right now per family  member   Yes Historical Provider, MD  terazosin (HYTRIN) 10 MG capsule TAKE 1 CAPSULE (10 MG TOTAL) BY MOUTH AT BEDTIME. 11/24/14  Yes Lorretta Harp, MD     History   Social History  . Marital Status: Widowed    Spouse Name: N/A  . Number of Children: N/A  . Years of Education: N/A   Occupational History  . Not on file.   Social History Main Topics  . Smoking status: Former Smoker -- 1.00 packs/day for 40 years  . Smokeless tobacco: Never Used     Comment: quit approx. 20 years ago.  . Alcohol Use: No  . Drug Use: Not on file  . Sexual Activity: No   Other Topics Concern  . Not on file   Social History Narrative    No family status information on file.   Family History  Problem Relation Age of Onset  . Stroke Sister      ROS:  Full 14 point review of systems complete and found to be negative unless listed above.  Physical Exam: Blood pressure 177/44, pulse 81, temperature 99.1 F (37.3 C), temperature source Oral, resp. rate 18, height 5' 6"  (1.676  m), weight 160 lb (72.576 kg), SpO2 96 %.  General: Well developed, well nourished, male in no acute distress Head: Eyes PERRLA, No xanthomas. Normocephalic and atraumatic, oropharynx without edema or exudate.  Lungs: Resp regular and unlabored, CTA. No wheezing or rales. Heart: RRR no s3, s4. Systolic murmur noted.  Neck: bilateral carotid bruits. No lymphadenopathy. no JVD. Abdomen: Bowel sounds present, abdomen soft and non-tender. Msk:  No spine or cva tenderness. No weakness, no joint deformities or effusions. Extremities: No clubbing, cyanosis or edema. DP/PT/Radials 2+ and equal bilaterally. Neuro: Alert and oriented X 3. No focal deficits noted. Psych:  Good affect, responds appropriately Skin: No rashes or lesions noted.  Labs:   Lab Results  Component Value Date   WBC 16.1* 03/22/2015   HGB 11.0* 03/22/2015   HCT 33.8* 03/22/2015   MCV 84.9 03/22/2015   PLT 221 03/22/2015    Recent Labs Lab 03/22/15 0422  NA 138  K 3.4*  CL 92*  CO2 31  BUN 114*  CREATININE 3.61*  CALCIUM 9.4  PROT 5.9*  BILITOT 1.2  ALKPHOS 318*  ALT 292*  AST 372*  GLUCOSE 182*  ALBUMIN 3.2*   Recent Labs  03/21/15 2238 03/22/15 0422 03/22/15 1108  TROPONINI 0.05* 0.07* 0.09*    Recent Labs  03/21/15 1512  TROPIPOC 0.02    No results found for: DDIMER LIPASE  Date/Time Value Ref Range Status  03/22/2015 04:22 AM 25 22 - 51 U/L Final   Echo: 2D echo 2009 showed EF of 55% and mild concentric LVH.   ECG:  EKG showed Normal sinus rhythm with rate of 70 bpm, LVH and prolonged QT of 492.  Radiology:  Ct Abdomen Pelvis Wo Contrast  03/21/2015   CLINICAL DATA:  Epigastric abdominal pain since Saturday.  EXAM: CT ABDOMEN AND PELVIS WITHOUT CONTRAST  TECHNIQUE: Multidetector CT imaging of the abdomen and pelvis was performed following the standard protocol without IV contrast.  COMPARISON:  None.  FINDINGS: Lower chest: The lung bases are clear of acute process. There is a small to  moderate-sized hiatal hernia containing contrast. Three-vessel coronary artery calcifications are noted.  Hepatobiliary: No focal hepatic lesions or intrahepatic biliary dilatation. A small gallstone is noted in the gallbladder. No common bile duct dilatation.  Pancreas: Normal.  Spleen: Normal size.  No focal lesions.  Adrenals/Urinary Tract: The adrenal glands are normal. Renal pelvic lipomatosis is noted but no renal calculi or obstructing ureteral calculi. No renal mass. No bladder calculi. Mild bladder distention. The prostate gland is mildly enlarged. The seminal vesicles are normal.  Stomach/Bowel: The stomach, duodenum, small bowel and colon are unremarkable. No inflammatory changes, mass lesions or obstructive findings. The terminal ileum is normal. The appendix is normal.  Vascular/Lymphatic: No mesenteric or retroperitoneal mass or adenopathy. Small scattered lymph nodes are noted. There are severe aortic and branch vessel calcifications but no focal aneurysm.  Other: No pelvic mass or adenopathy. No free pelvic fluid collections. No inguinal mass or adenopathy.  Musculoskeletal: No significant bony findings. Moderate degenerative changes involving the spine.  IMPRESSION: 1. Severe atherosclerotic calcifications involving the aorta and branch vessels. 2. Moderate-sized hiatal hernia with reflux of oral contrast into the distal esophagus. 3. Cholelithiasis. 4. No acute abdominal/pelvic findings, mass lesions or adenopathy.   Electronically Signed   By: Marijo Sanes M.D.   On: 03/21/2015 19:07   Dg Chest 2 View  03/21/2015   CLINICAL DATA:  SOB all the time, abdominal pain with weakness x 2 days. Hx of COPD, HTN, diabetes.  EXAM: CHEST  2 VIEW  COMPARISON:  08/27/2012  FINDINGS: The heart size and mediastinal contours are within normal limits. Mild stable scarring at the lung apices. Lungs otherwise clear. No pleural effusion or pneumothorax. The visualized skeletal structures show no acute findings.   IMPRESSION: No active cardiopulmonary disease.   Electronically Signed   By: Lajean Manes M.D.   On: 03/21/2015 16:51   Peripheral vascular Cath: 11/01/2014 Angiographic Data:  1: Abdominal aortogram-the abdominal aortogram was free of aneurysmal or obstructive disease. There was a small ulcerated plaque on the left lateral distal abdominal aortic wall and no clinical significance. 2: Left lower extremity-60% hypodense lesion in the distal left common iliac artery at the junction of the internal/external bifurcation. There was a focal 60% stenosis in the proximal third of the left SFA along with a moderately long calcified segmental mid left SFA in the 50% range with three-vessel runoff 3: Right lower extremity-there was a 40% proximal right common iliac artery stenosis with a less than 20 mm pullback gradient after administration of 200 g of intra-arterial nitroglycerin. The SideArm sheath. There was a 60% segmental proximal right SFA stenosis along with 80% fairly focal calcified distal right SFA stenosis with three-vessel runoff. The proximal third of the right anterior tibial artery appeared to be diffusely diseased in the 75% range. IMPRESSION:Mr. Theisen has what appears to be a physiologically significant distal left common iliac artery stenosis corresponding to an area of abnormality on duplex ultrasound. There was no pullback gradient across the left SFA area of disease. We will proceed with PTA and stenting of the distal left common iliac artery using self-expanding stent. The patient is already on dual antiplatelet therapy including aspirin and Plavix. Procedure Description:the 5 French sheath was exchanged over the [place] wire for a 6 [place] multipurpose destination sheath. Direct stenting was performed with a 10 mm x 4 cm long Cordis Smart nitinol self expanding stent which was postdilated with a 7 mm x 4 cm balloon up to 4 atm resulting in reduction of 60% hypodense distal left common  iliac artery stenosis to 0% residual. The sheath was then withdrawn across the bifurcation and exchanged over the same wire for a short 6 French sheath. The patient received  20 mg of intravenous hydralazine at the end of the case for blood pressure control prior to sheath removal. He received a total of 7500 units of heparin during the case with an ending ACT of 214. Total contrast administered was 165 mL to the patient. Final Impression: successful PCA and stenting of a physiologically significant distal left common iliac artery stenosis demonstrated with a pullback radiant of 40 mmHg using a 4 [place] end hole catheter. This was stented with a 10 mm x 4 cm like to also expanding stent. The left SFA disease did not appear to be physiologically significant. The patient does have disease in his right SFA and anterior tibial artery. The sheath will be removed once the ACT falls below 170 pressure held. The patient's serum creatinine was 1.6 this morning the creatinine clearance of 40 mL/m. He'll be hydrated overnight and discharged home in the morning on dual antibiotic therapy. We will get lower extremity are chilled Doppler studies in our [place] on office in one week. He'll be seen back in 2-3 weeks for clinical follow-up as an outpatient.   ASSESSMENT AND PLAN:    Principal Problem:   Abdominal pain Active Problems:   Hypertension   Peripheral arterial disease   Renal failure (ARF), acute on chronic   Diabetes mellitus type 2, controlled   Epigastric pain  1. Minimal  elevated Troponin hx of CAD -   Pt is symptomatic. CXR showed no active cardiopulmonary disease. EKG without acute abnormality. Trop trend is 0.05->0.07->0.09. - Cath 2002 that showed 50-60% scattered stenosis of ramus intermedius, 60% first OM, 40% proximal RCA and LV EF of 50%. 2D echo 2009 showed EF of 55% and mild concentric LVH. He had a low risk Myoview 06/2010. Tele showed sinus rhythm with occasional PVCs. - Continue to monitor.  Likely due to acute on chronic kidney failure. May be kidney is unable to cleared troponin.  -Continue ASA, plavix, Zocor 69m.    2. HTN - Not well controlled. BP 177/44. Continue hydralazine 553mTID,  Clonidine 0.31m731mID. Norvasc 29m231marted today.  3. Peripheral vascular/arterial disease - S/P successful PCA and stenting of a physiologically significant distal left common iliac artery stenosis 10/2014. - Carotid duplex 01/07/2015 showed greater than 70% diameter reduction of right bulb/proximal ICA, 50-69% diameter reduction of bilateral subclavian artery and equal to to greater than 50% diameter reduction of left CEA. When compared to prior study 06/2014 there are no significant changes noted.  -continue ASA and plavix    4. Acute on chronic renal failure, state 4 - Creatinine 3.7 on this admission, improved to 3.61 - Recent baseline in 10/2014 was 3.09. Possibly worse now due to lasix and zaroxolyn which were already placed on hold prior to admission - Continue to monitor  5. Systolic murmur - Will get 2d echo. Pt is asymptomatic. Suspect AI in a setting of high SBP and low DBP.   6. Hypokalemia  - K 3.4 today. supplement given   7. Epigastric pain/ cholelithiasis  -Per primary. MRCP reveled gallstone without evidence for CBD dilatation, obstruction or choledocholithiasis. Surgery consulted.  8. Anemia of chronic disease - Per primary  9. Prolonged QT -  Prolonged QT of 492 on admission. QT was 416 on 08/2014 and 428 on 01/2012. Appears chronic, however more prominent. Avoid drugs which can prolong QT.   10. HL -06/22/2014: Cholesterol, Total 139; HDL-C 41; LDL (calc) 75; Triglycerides 116; VLDL 23. Continue zocor.   Signed: BhLeanor Kail 03/22/2015, 2:17 PM  Pager [phone]  Co-Sign MD   Attending Note:   The patient was seen and examined.  Agree with assessment and plan as noted above.  Changes made to the above note as needed.  Pt has no symptoms that would  suggest acute coronary syndrome.  His symptoms are c/w cholecystitis.   He has moderate - severe CAD and has cholecystitis with elevated WBC. It's not unusual to have trivial Troponin elevations in either of these circumstances, especially in an 81 yo.  Will be getting an echo to assess his murmur so we will have an assessment of his LV function.  2. Murmur:  Wide pulse pressure and systolic / diastolic murmur is suggestive of aortic insufficiency.   If the LV function is fairly normal, he should be at low risk for abdominal surgery.     Thayer Headings, Brooke Bonito., MD, [facility] 03/22/2015, 5:50 PM [address]  [place] Pager [phone]

## 2015-03-23 ENCOUNTER — Inpatient Hospital Stay (HOSPITAL_COMMUNITY): Payer: Medicare Other

## 2015-03-23 ENCOUNTER — Encounter (HOSPITAL_COMMUNITY): Payer: Medicare Other

## 2015-03-23 DIAGNOSIS — N183 Chronic kidney disease, stage 3 (moderate): Secondary | ICD-10-CM

## 2015-03-23 DIAGNOSIS — R011 Cardiac murmur, unspecified: Secondary | ICD-10-CM

## 2015-03-23 LAB — BASIC METABOLIC PANEL
Anion gap: 11 (ref 5–15)
BUN: 117 mg/dL — ABNORMAL HIGH (ref 6–20)
CO2: 30 mmol/L (ref 22–32)
CREATININE: 3.76 mg/dL — AB (ref 0.61–1.24)
Calcium: 8.8 mg/dL — ABNORMAL LOW (ref 8.9–10.3)
Chloride: 95 mmol/L — ABNORMAL LOW (ref 101–111)
GFR calc Af Amer: 16 mL/min — ABNORMAL LOW (ref >60)
GFR, EST NON AFRICAN AMERICAN: 14 mL/min — AB (ref >60)
GLUCOSE: 148 mg/dL — AB (ref 65–99)
Potassium: 3.6 mmol/L (ref 3.5–5.1)
Sodium: 136 mmol/L (ref 135–145)

## 2015-03-23 LAB — HEMOGLOBIN A1C
Hgb A1c MFr Bld: 7.4 % — ABNORMAL HIGH (ref 4.8–5.6)
Mean Plasma Glucose: 166 mg/dL

## 2015-03-23 LAB — CBC
HEMATOCRIT: 30.9 % — AB (ref 39.0–52.0)
HEMOGLOBIN: 10.1 g/dL — AB (ref 13.0–17.0)
MCH: 28.1 pg (ref 26.0–34.0)
MCHC: 32.7 g/dL (ref 30.0–36.0)
MCV: 85.8 fL (ref 78.0–100.0)
Platelets: 184 10*3/uL (ref 150–400)
RBC: 3.6 MIL/uL — AB (ref 4.22–5.81)
RDW: 17.4 % — ABNORMAL HIGH (ref 11.5–15.5)
WBC: 16.4 10*3/uL — ABNORMAL HIGH (ref 4.0–10.5)

## 2015-03-23 LAB — GLUCOSE, CAPILLARY
GLUCOSE-CAPILLARY: 153 mg/dL — AB (ref 65–99)
GLUCOSE-CAPILLARY: 322 mg/dL — AB (ref 65–99)
Glucose-Capillary: 130 mg/dL — ABNORMAL HIGH (ref 65–99)
Glucose-Capillary: 126 mg/dL — ABNORMAL HIGH (ref 65–99)

## 2015-03-23 MED ORDER — MORPHINE SULFATE 4 MG/ML IJ SOLN
2.9200 mg | Freq: Once | INTRAMUSCULAR | Status: AC
Start: 2015-03-23 — End: 2015-03-23
  Administered 2015-03-23: 2.92 mg via INTRAVENOUS

## 2015-03-23 MED ORDER — SODIUM CHLORIDE 0.9 % IV SOLN
1.5000 g | Freq: Two times a day (BID) | INTRAVENOUS | Status: DC
  Administered 2015-03-23: 1.5 g via INTRAVENOUS
  Filled 2015-03-23 (×2): qty 1.5

## 2015-03-23 MED ORDER — SODIUM CHLORIDE 0.9 % IV SOLN
INTRAVENOUS | Status: DC
  Administered 2015-03-23: 17:00:00 via INTRAVENOUS

## 2015-03-23 MED ORDER — MORPHINE SULFATE 4 MG/ML IJ SOLN
INTRAMUSCULAR | Status: AC
  Filled 2015-03-23: qty 1

## 2015-03-23 MED ORDER — TECHNETIUM TC 99M MEBROFENIN IV KIT
5.0000 | PACK | Freq: Once | INTRAVENOUS | Status: AC | PRN
  Administered 2015-03-23: 5 via INTRAVENOUS

## 2015-03-23 NOTE — Progress Notes (Signed)
ANTIBIOTIC CONSULT NOTE - INITIAL  Pharmacy Consult for Unasyn Indication: Gallbladder  Allergies  Allergen Reactions  . Labetalol Swelling    angioedema    Patient Measurements: Height:  (167.6 cm) Weight: 161 lb 6 oz (73.2 kg) IBW/kg (Calculated) : 63.8 Adjusted Body Weight:    Vital Signs: Temp: 98.9 F (37.2 C) (05/25 1710) Temp Source: Oral (05/25 1710) BP: 181/33 mmHg (05/25 1710) Pulse Rate: 52 (05/25 1710) Intake/Output from previous day: 05/24 0701 - 05/25 0700 In: 2062.5 [P.O.:600; I.V.:1162.5; IV Piggyback:300] Out: 1200 [Urine:1200] Intake/Output from this shift: Total I/O In: 0  Out: 425 [Urine:425]  Labs:  Recent Labs  03/21/15 1501 03/22/15 0422 03/23/15 0555  WBC 14.2* 16.1* 16.4*  HGB 11.9* 11.0* 10.1*  PLT 219 221 184  CREATININE 3.70* 3.61* 3.76*   Estimated Creatinine Clearance: 13.7 mL/min (by C-G formula based on Cr of 3.76). No results for input(s): VANCOTROUGH, VANCOPEAK, VANCORANDOM, GENTTROUGH, GENTPEAK, GENTRANDOM, TOBRATROUGH, TOBRAPEAK, TOBRARND, AMIKACINPEAK, AMIKACINTROU, AMIKACIN in the last 72 hours.   Microbiology: No results found for this or any previous visit (from the past 720 hour(s)).  Medical History: Past Medical History  Diagnosis Date  . Diabetes mellitus   . Hypertension   . Hypercholesteremia   . Gout   . PVD (peripheral vascular disease) 7/05    LCE, known 60% RICA 7/12  . CAD (coronary artery disease) 2002    non critical  . Macular degeneration   . Chronic renal disease, stage III   . Claudication   . Palpitations     PVCs and PSVT on Holter monitoring  . Bilateral carotid artery disease     status post left carotid endarterectomy performed by Dr. Madilyn Fireman 05/12/04  . Anemia     Medications:  Prescriptions prior to admission  Medication Sig Dispense Refill Last Dose  . allopurinol (ZYLOPRIM) 100 MG tablet TAKE 1 TABLET (100 MG TOTAL) BY MOUTH AT BEDTIME. 30 tablet 2 03/21/2015 at Unknown time   . amLODipine (NORVASC) 10 MG tablet Take 1 tablet by mouth every morning.    03/21/2015 at Unknown time  . aspirin EC 81 MG tablet Take 81 mg by mouth every evening.   03/21/2015 at Unknown time  . calcitRIOL (ROCALTROL) 0.25 MCG capsule Take 0.25 mcg by mouth every morning.    03/21/2015 at Unknown time  . cholecalciferol (VITAMIN D) 1000 UNITS tablet Take 1,000 Units by mouth 2 (two) times daily.    03/21/2015 at Unknown time  . cloNIDine (CATAPRES) 0.1 MG tablet Take 1 tablet (0.1 mg total) by mouth 3 (three) times daily. 90 tablet 11 03/21/2015 at Unknown time  . clopidogrel (PLAVIX) 75 MG tablet TAKE 1 TABLET BY MOUTH DAILY 30 tablet 10 03/21/2015 at Unknown time  . furosemide (LASIX) 80 MG tablet Take 1 tablet (80 mg total) by mouth daily. HOLD 48 hours, resume on 11/07/2014. (Patient taking differently: Take 80 mg by mouth daily. Take 2 tablets oral three times daily) 30 tablet 2 03/21/2015 at Unknown time  . hydrALAZINE (APRESOLINE) 50 MG tablet Take 1 tablet (50 mg total) by mouth every 8 (eight) hours. 90 tablet 11 03/21/2015 at Unknown time  . metolazone (ZAROXOLYN) 5 MG tablet Take 5 mg by mouth daily.   03/21/2015 at Unknown time  . Multiple Vitamins-Minerals (PRESERVISION AREDS PO) Take 2 tablets by mouth daily.   03/21/2015 at Unknown time  . simvastatin (ZOCOR) 20 MG tablet Take 1 tablet (20 mg total) by mouth daily at 6 PM. 90  tablet 3 03/20/2015 at Unknown time  . sitaGLIPtin (JANUVIA) 100 MG tablet Take 100 mg by mouth every morning. Doctor gave him samples for right now per family member   03/21/2015 at Unknown time  . terazosin (HYTRIN) 10 MG capsule TAKE 1 CAPSULE (10 MG TOTAL) BY MOUTH AT BEDTIME. 90 capsule 2 03/20/2015 at Unknown time   Assessment: 81 y/o M with significant PMH [facility] with worsening intermittent epigastric pain. Pt with cholelithiasis but no evidence of cholecystitis. Plan MRCP. WBC 16.4. Afebrile. Stage 4 CKD. Current CrCl estimated 13.  Goal of Therapy:   Eradiation of infection  Plan:  Unasyn 1.5g IV q12h   Keenan Trefry S. Merilynn Finlandobertson, PharmD, BCPS Clinical Staff Pharmacist Pager [phone]  Misty Stanleyobertson, Johara Lodwick Stillinger 03/23/2015,6:27 PM

## 2015-03-23 NOTE — Progress Notes (Signed)
Patient Name: [name] Date of Encounter: 03/23/2015  Primary Cardiologist Dr. Allyson SabalBerry   Principal Problem:   Abdominal pain Active Problems:   Hypertension   Peripheral arterial disease   Renal failure (ARF), acute on chronic   Diabetes mellitus type 2, controlled   Epigastric pain   Elevated liver enzymes   Abdominal pain, acute, right upper quadrant   Elevated troponin    SUBJECTIVE  Never had any chest pain. Denies any SOB. Abdomen is ok this morning.   CURRENT MEDS . allopurinol  100 mg Oral Daily  . amLODipine  10 mg Oral q morning - 10a  . aspirin EC  81 mg Oral QPM  . calcitRIOL  0.25 mcg Oral q morning - 10a  . cholecalciferol  1,000 Units Oral BID  . cloNIDine  0.1 mg Oral TID  . enoxaparin (LOVENOX) injection  30 mg Subcutaneous Q24H  . feeding supplement (RESOURCE BREEZE)  1 Container Oral TID BM  . hydrALAZINE  50 mg Oral 3 times per day  . insulin aspart  0-9 Units Subcutaneous TID WC  . linagliptin  5 mg Oral Daily  . pantoprazole (PROTONIX) IV  40 mg Intravenous Q12H  . simvastatin  20 mg Oral q1800  . terazosin  10 mg Oral QHS    OBJECTIVE  Filed Vitals:   03/22/15 1705 03/22/15 2035 03/23/15 0426 03/23/15 0954  BP: 166/43 187/34 138/48 179/48  Pulse: 81 84 81 83  Temp: 98.6 F (37 C) 98.1 F (36.7 C) 98.3 F (36.8 C) 98.4 F (36.9 C)  TempSrc: Oral Oral Oral Oral  Resp: 18 17 18 18   Height:      Weight:   161 lb 6 oz (73.2 kg)   SpO2: 96% 92% 93% 96%    Intake/Output Summary (Last 24 hours) at 03/23/15 1037 Last data filed at 03/23/15 0900  Gross per 24 hour  Intake 1822.5 ml  Output   1250 ml  Net  572.5 ml   Filed Weights   03/21/15 1455 03/21/15 2144 03/23/15 0426  Weight: 159 lb (72.122 kg) 160 lb (72.576 kg) 161 lb 6 oz (73.2 kg)    PHYSICAL EXAM  General: Pleasant, NAD. Neuro: Alert and oriented X 3. Moves all extremities spontaneously. Psych: Normal affect. HEENT:  Normal  Neck: Supple without bruits or  JVD. Lungs:  Resp regular and unlabored. Mildly decreased breath sound in bilateral base. Heart: RRR no s3, s4, or murmurs. Abdomen: Soft, non-distended, BS + x 4.  Extremities: No clubbing, cyanosis or edema. DP/PT/Radials 2+ and equal bilaterally.  Accessory Clinical Findings  CBC  Recent Labs  03/21/15 1501 03/22/15 0422 03/23/15 0555  WBC 14.2* 16.1* 16.4*  NEUTROABS 12.6* 14.0*  --   HGB 11.9* 11.0* 10.1*  HCT 35.9* 33.8* 30.9*  MCV 85.5 84.9 85.8  PLT 219 221 184   Basic Metabolic Panel  Recent Labs  03/22/15 0422 03/23/15 0555  NA 138 136  K 3.4* 3.6  CL 92* 95*  CO2 31 30  GLUCOSE 182* 148*  BUN 114* 117*  CREATININE 3.61* 3.76*  CALCIUM 9.4 8.8*   Liver Function Tests  Recent Labs  03/21/15 1501 03/22/15 0422  AST 25 372*  ALT 23 292*  ALKPHOS 85 318*  BILITOT 0.9 1.2  PROT 6.6 5.9*  ALBUMIN 3.7 3.2*    Recent Labs  03/21/15 1501 03/22/15 0422  LIPASE 27 25   Cardiac Enzymes  Recent Labs  03/21/15 2238 03/22/15 0422 03/22/15 1108  TROPONINI 0.05* 0.07* 0.09*   Hemoglobin A1C  Recent Labs  03/22/15 1628  HGBA1C 7.4*    TELE NSR with HR 70-80s, occasional bradycardia in 40s    ECG  No new EKG  Echocardiogram  pending    Radiology/Studies  Ct Abdomen Pelvis Wo Contrast  03/21/2015   CLINICAL DATA:  Epigastric abdominal pain since Saturday.  EXAM: CT ABDOMEN AND PELVIS WITHOUT CONTRAST  TECHNIQUE: Multidetector CT imaging of the abdomen and pelvis was performed following the standard protocol without IV contrast.  COMPARISON:  None.  FINDINGS: Lower chest: The lung bases are clear of acute process. There is a small to moderate-sized hiatal hernia containing contrast. Three-vessel coronary artery calcifications are noted.  Hepatobiliary: No focal hepatic lesions or intrahepatic biliary dilatation. A small gallstone is noted in the gallbladder. No common bile duct dilatation.  Pancreas: Normal.  Spleen: Normal size.  No  focal lesions.  Adrenals/Urinary Tract: The adrenal glands are normal. Renal pelvic lipomatosis is noted but no renal calculi or obstructing ureteral calculi. No renal mass. No bladder calculi. Mild bladder distention. The prostate gland is mildly enlarged. The seminal vesicles are normal.  Stomach/Bowel: The stomach, duodenum, small bowel and colon are unremarkable. No inflammatory changes, mass lesions or obstructive findings. The terminal ileum is normal. The appendix is normal.  Vascular/Lymphatic: No mesenteric or retroperitoneal mass or adenopathy. Small scattered lymph nodes are noted. There are severe aortic and branch vessel calcifications but no focal aneurysm.  Other: No pelvic mass or adenopathy. No free pelvic fluid collections. No inguinal mass or adenopathy.  Musculoskeletal: No significant bony findings. Moderate degenerative changes involving the spine.  IMPRESSION: 1. Severe atherosclerotic calcifications involving the aorta and branch vessels. 2. Moderate-sized hiatal hernia with reflux of oral contrast into the distal esophagus. 3. Cholelithiasis. 4. No acute abdominal/pelvic findings, mass lesions or adenopathy.   Electronically Signed   By: Rudie Meyer M.D.   On: 03/21/2015 19:07   Dg Chest 2 View  03/21/2015   CLINICAL DATA:  SOB all the time, abdominal pain with weakness x 2 days. Hx of COPD, HTN, diabetes.  EXAM: CHEST  2 VIEW  COMPARISON:  08/27/2012  FINDINGS: The heart size and mediastinal contours are within normal limits. Mild stable scarring at the lung apices. Lungs otherwise clear. No pleural effusion or pneumothorax. The visualized skeletal structures show no acute findings.  IMPRESSION: No active cardiopulmonary disease.   Electronically Signed   By: Amie Portland M.D.   On: 03/21/2015 16:51   Mr Abdomen Mrcp Wo Cm  03/22/2015   CLINICAL DATA:  Abnormal liver function tests.  Gallstones.  EXAM: MRI ABDOMEN WITHOUT CONTRAST  (INCLUDING MRCP)  TECHNIQUE: Multiplanar  multisequence MR imaging of the abdomen was performed. Heavily T2-weighted images of the biliary and pancreatic ducts were obtained, and three-dimensional MRCP images were rendered by post processing.  COMPARISON:  03/21/2015  FINDINGS: Lower chest: There is no pleural effusion identified. No pericardial effusion.  Hepatobiliary: No focal liver abnormality identified. There is diffuse low signal on the T1 and T2 weighted sequences suggesting diffuse iron deposition. Small stone within the dependent portion of the gallbladder measures 5 mm. No gallbladder wall thickening or pericholecystic fluid. No biliary dilatation or evidence of choledocholithiasis.  Pancreas: There is no focal pancreas abnormality.  Spleen: No focal spleen abnormality.  Adrenals/Urinary Tract: The adrenal glands are both normal. There is bilateral and symmetric renal cortical thinning. No obstructive uropathy or mass noted.  Stomach/Bowel: The stomach and the  small bowel loops are on unremarkable. The visualized portions of the colon are within normal limits.  Vascular/Lymphatic: Normal appearance of the abdominal aorta. No upper abdominal adenopathy noted.  Other: Insert ascites stress set no free fluid or fluid collections identified.  Musculoskeletal: There is diffuse low signal throughout the bone marrow.  IMPRESSION: 1. No acute findings. 2. Gallstone. No evidence for choledocholithiasis or biliary obstruction. 3. Diffuse low signal within the liver, spleen and bone marrow which may be seen with abnormal iron deposition.   Electronically Signed   By: Signa Kell M.D.   On: 03/22/2015 15:43   Mr 3d Recon At Scanner  03/22/2015   CLINICAL DATA:  Abnormal liver function tests.  Gallstones.  EXAM: MRI ABDOMEN WITHOUT CONTRAST  (INCLUDING MRCP)  TECHNIQUE: Multiplanar multisequence MR imaging of the abdomen was performed. Heavily T2-weighted images of the biliary and pancreatic ducts were obtained, and three-dimensional MRCP images were  rendered by post processing.  COMPARISON:  03/21/2015  FINDINGS: Lower chest: There is no pleural effusion identified. No pericardial effusion.  Hepatobiliary: No focal liver abnormality identified. There is diffuse low signal on the T1 and T2 weighted sequences suggesting diffuse iron deposition. Small stone within the dependent portion of the gallbladder measures 5 mm. No gallbladder wall thickening or pericholecystic fluid. No biliary dilatation or evidence of choledocholithiasis.  Pancreas: There is no focal pancreas abnormality.  Spleen: No focal spleen abnormality.  Adrenals/Urinary Tract: The adrenal glands are both normal. There is bilateral and symmetric renal cortical thinning. No obstructive uropathy or mass noted.  Stomach/Bowel: The stomach and the small bowel loops are on unremarkable. The visualized portions of the colon are within normal limits.  Vascular/Lymphatic: Normal appearance of the abdominal aorta. No upper abdominal adenopathy noted.  Other: Insert ascites stress set no free fluid or fluid collections identified.  Musculoskeletal: There is diffuse low signal throughout the bone marrow.  IMPRESSION: 1. No acute findings. 2. Gallstone. No evidence for choledocholithiasis or biliary obstruction. 3. Diffuse low signal within the liver, spleen and bone marrow which may be seen with abnormal iron deposition.   Electronically Signed   By: Signa Kell M.D.   On: 03/22/2015 15:43    ASSESSMENT AND PLAN  1. Minimal elevated Troponin hx of CAD  -No EKG changes, no angina  - Cath 2002 that showed 50-60% scattered stenosis of ramus intermedius, 60% first OM, 40% proximal RCA and LV EF of 50%. 2D echo 2009 showed EF of 55% and mild concentric LVH.  - He had a low risk Myoview 06/2010.   -Continue ASA, plavix, Zocor .   - likely related to renal failure, if echo normal, would not expect further cardiac workup  2. Cholelithiasis with possible cholecystitis  - following by surgery,  pending HIDA scan  3. HTN 4. Peripheral vascular/arterial disease - S/P successful PCA and stenting of a physiologically significant distal left common iliac artery stenosis 10/2014. - Carotid duplex 01/07/2015 showed greater than 70% diameter reduction of right bulb/proximal ICA, 50-69% diameter reduction of bilateral subclavian artery and equal to to greater than 50% diameter reduction of left CEA. When compared to prior study 06/2014 there are no significant changes noted.  -continue ASA and plavix   5. Acute on chronic renal failure, state 4 6. Systolic murmur  - pending echo 7. Hypokalemia  8. Anemia of chronic disease 9. Prolonged QT  - Prolonged QT of 492 on admission. QT was 416 on 08/2014 and 428 on 01/2012. Appears chronic, however  more prominent. Avoid drugs which can prolong QT.  10. HLD  -06/22/2014: Cholesterol, Total 139; HDL-C 41; LDL (calc) 75; Triglycerides 116; VLDL 23. Continue zocor.   Ramond Dial PA-C Pager: [phone]  Attending Note:   The patient was seen and examined.  Agree with assessment and plan as noted above.  Changes made to the above note as needed.  I have reviewed the echo images. His LV function is normal  / supranormal. He is at low risk for surgery if he needs to go to surgery   No further recs.   Will sign off. Call for questions   Alvia Grove., MD, [facility] 03/23/2015, 12:33 PM [address],  [address] Office [phone] Pager [phone]

## 2015-03-23 NOTE — Progress Notes (Signed)
Consult with patient  [facility] was in [facility], [facility] I meet with his daughter.  Spoke with daughter and she informed me that their Renato Gailsastor had visited them and set follow-up visits to check on family.  Encouraged focus on present: Chaplain recognized catastrophic thinking and anxiety about possible future events and suggests of  greater consideration of gifts and challenges of today.  Explored faith and belief issues :  Daughter reflected upon the connections between her faith and her current circumstance.  Chaplain listened attentively and reflectively as daughter shared her theological beliefs and the strength it provided.   Followed up with Jasmine the nurse upon leading the room. Will follow-up if requested or needed.

## 2015-03-23 NOTE — Progress Notes (Signed)
Chaplain Note:   Chaplain visited with [name] and his daughter responding to advanced directive consult. He presented as robust and conversational. His daughter was bedside, also warm and conversational.   The mentioned that [place] came by earlier but didn't mention anything about the advanced directive. He had a copy of one and expressed that he didn't go over it yet. He noted " she knows I don't want to be hooked up with all those tubes to all those machines and such"   Pt's spouse passed a while back and he has two children, his daughter who is bedside, and an estranged son who hasn't been in contact with the family in 10 years.   This is a point of sensitivity for [name] as he notes that " I don't know whether to cry or be angry".  They expressed they would go over the packet when they're ready.   Gala RomneyBrown, Bertel Venard J, Chaplain 03/23/2015

## 2015-03-23 NOTE — Progress Notes (Addendum)
Patient ID: [name], male   DOB: 07/09/1930, 81 y.o.   MRN: [id number]    Request for perc chole drain RUQ pain Wbc 16.4 CT abd shows cholelithiasis HIDA shows NO visualization of GB  Pt on Plavix daily secondary PVD-  L iliac stent placed 10/2014 Last dose 5/24pm  Will HOLD now Plan for tentatively 5/27 (off 3 days by then) IR PA will see when appropriate  Ashok NorrisEmina Riebock NP aware and agreeable  IR PA to see prior to procedure

## 2015-03-23 NOTE — Progress Notes (Addendum)
Patient ID: [name], male   DOB: 12/30/1929, 81 y.o.   MRN: [id number] [facility]  Pasty ArchRoy C Barret OZH:[id number] DOB: 03/02/1930 DOA: 03/21/2015 PCP: [name],[name], MD  Brief narrative:    81 y.o. male with past medical history of peripheral vascular disease status post left lower extremity stenting, chronic kidney disease stage 3-4, hypertension, hyperlipidemia, asthma who [facility] with worsening intermittent epigastric pain for past few days prior to this admission. Pain is described as gnawing, worse with eating so this led to patient having poor po intake over past day or so. His pain is resolved at this point.   On admission, his BP was 197/45, afebrile and oxygen saturation 90-97% on room air. Blood work was notable for leukocytosis of 14.2, hemoglobin of 11.9, potassium of 3.5, creatinine of 3.7. Of note, per patient he just saw nephrologist who stopped lasix and zaroxolyn due to significant weight loss over past 6 months. CT abdomen and pelvis on this admission showed cholelithiasis and hiatal hernia with reflux. LFT's were WNL on this admission. Lipase was WNL.   His hospital course is slightly complicated wit mild troponin elevation for which reason we consulted cardiology.    Assessment/Plan:    Epigastric abdominal pain, Transaminases;  - In pt with cholelithiasis but no evidence of cholecystitis.  Lipase is WNL. - Pt does have leukocytosis but no fevers. - GI has seen the pt in consultation. Plan is for MRCP. - Of note, he is on aspirin and plavix because of extensive PVD, CAD. Still on both aspirin and plavix unless GI says to hold.  -MRCP; Gallstone. No evidence of choledocholithiasis or biliary obstruction.  -surgery consulted, recommending HIDA scan.  -hold statin due to elevated LFT. Repeat labs in am./   Essential hypertension - Continue Norvasc, clonidine, hydralazine - Order placed for hydralazine PRN if BP above 150/90  Peripheral  arterial disease - Continue aspirin and plavix  Renal failure (ARF), acute on chronic, stage 4 - Creatinine 3.7 on this admission - Recent baseline in 10/2014 was 3.09. Possibly worse now due to lasix and zaroxolyn which were already placed on hold prior to admission -will order IV fluids for 8 hours.   Leukocytosis;  UA negative, chest x ary negative,.  HIDA scan ordered.  Patient denies cough, dysuria, diarrhea.   Diabetes mellitus type 2 with renal and peripheral vascular manifestations - No A1c in EPIC so unclear if DM controlled - Continue Tradjenta and SSI  Anemia of chronic disease - Secondary to CKD - Hemoglobin stable at 11 - No reports of bleeding   Hypokalemia - resolved.   Troponin elevation - Likely demand ischemia from CKD - Cardio consulted - No acute ischemic changes on 12 lead EKG - No complaints of chest pain - Continue aspirin and plavix  Note plavix was discontinue today. Will resume 5-26 if no surgery is planned.   Dyslipidemia - Continue statin therapy    DVT Prophylaxis  - On Lovenox subQ in hospital    Code Status: Full.  Family Communication:  plan of care discussed with the patient Disposition Plan: needs MRCP today. Also needs cardio evaluation for mild troponin elevation. Not stable for D/C at this time.  IV access:  Peripheral IV  Procedures and diagnostic studies:    Ct Abdomen Pelvis Wo Contrast 03/21/2015   1. Severe atherosclerotic calcifications involving the aorta and branch vessels. 2. Moderate-sized hiatal hernia with reflux of oral contrast into the distal esophagus. 3. Cholelithiasis.  4. No acute abdominal/pelvic findings, mass lesions or adenopathy.   Electronically Signed   By: Rudie Meyer M.D.   On: 03/21/2015 19:07   Dg Chest 2 View 03/21/2015    No active cardiopulmonary disease.   Electronically Signed   By: Amie Portland M.D.   On: 03/21/2015 16:51   Medical Consultants:  Gastroenterology Cardiology   Other  Consultants:  None   IAnti-Infectives:   None    Alba Cory, MD  [facility] Pager [phone]  Time spent in minutes: 25 minutes  If 7PM-7AM, please contact night-coverage [email or web address] Password [email or web address] 03/23/2015, 2:11 PM   LOS: 2 days    HPI/Subjective: No nausea, wants water.  He relates mild RUQ tenderness on palpation.  no diarrhea, no cough, no dysuria.  No melena. No confusion, no nausea. No evidence of uremic symptoms.    Objective: Filed Vitals:   03/22/15 1705 03/22/15 2035 03/23/15 0426 03/23/15 0954  BP: 166/43 187/34 138/48 179/48  Pulse: 81 84 81 83  Temp: 98.6 F (37 C) 98.1 F (36.7 C) 98.3 F (36.8 C) 98.4 F (36.9 C)  TempSrc: Oral Oral Oral Oral  Resp: Height:      Weight:   73.2 kg (161 lb 6 oz)   SpO2: 96% 92% 93% 96%    Intake/Output Summary (Last 24 hours) at 03/23/15 1411 Last data filed at 03/23/15 1300  Gross per 24 hour  Intake 1822.5 ml  Output    975 ml  Net  847.5 ml    Exam:   General:  Pt is alert, follows commands appropriately, not in acute distress  Cardiovascular: Regular rate and rhythm, S1/S2 (+)  Respiratory: Clear to auscultation bilaterally, no wheezing, no crackles, no rhonchi  Abdomen: Soft, mild right UQ  tender, non distended, bowel sounds present  Extremities: No edema, pulses DP and PT palpable bilaterally  Neuro: Grossly nonfocal  Data Reviewed: Basic Metabolic Panel:  Recent Labs Lab 03/21/15 1501 03/22/15 0422 03/23/15 0555  NA 137 138 136  K 3.5 3.4* 3.6  CL 91* 92* 95*  CO2 GLUCOSE 219* 182* 148*  BUN 117* 114* 117*  CREATININE 3.70* 3.61* 3.76*  CALCIUM 9.6 9.4 8.8*   Liver Function Tests:  Recent Labs Lab 03/21/15 1501 03/22/15 0422  AST 25 372*  ALT 23 292*  ALKPHOS 85 318*  BILITOT 0.9 1.2  PROT 6.6 5.9*  ALBUMIN 3.7 3.2*    Recent Labs Lab 03/21/15 1501 03/22/15 0422  LIPASE 27 25   No results for input(s): AMMONIA in the  last 168 hours. CBC:  Recent Labs Lab 03/21/15 1501 03/22/15 0422 03/23/15 0555  WBC 14.2* 16.1* 16.4*  NEUTROABS 12.6* 14.0*  --   HGB 11.9* 11.0* 10.1*  HCT 35.9* 33.8* 30.9*  MCV 85.5 84.9 85.8  PLT 219 221 184   Cardiac Enzymes:  Recent Labs Lab 03/21/15 2238 03/22/15 0422 03/22/15 1108  TROPONINI 0.05* 0.07* 0.09*   BNP: Invalid input(s): POCBNP CBG:  Recent Labs Lab 03/22/15 1144 03/22/15 1653 03/22/15 2032 03/23/15 0813 03/23/15 1146  GLUCAP 173* 202* 216* 153* 130*    No results found for this or any previous visit (from the past 240 hour(s)).   Scheduled Meds: . allopurinol  100 mg Oral Daily  . amLODipine  10 mg Oral q morning - 10a  . aspirin EC  81 mg Oral QPM  . calcitRIOL  0.25 mcg Oral q morning - 10a  .  cholecalciferol  1,000 Units Oral BID  . cloNIDine  0.1 mg Oral TID  . enoxaparin (LOVENOX) injection  30 mg Subcutaneous Q24H  . feeding supplement (RESOURCE BREEZE)  1 Container Oral TID BM  . hydrALAZINE  50 mg Oral 3 times per day  . insulin aspart  0-9 Units Subcutaneous TID WC  . linagliptin  5 mg Oral Daily  . morphine      . pantoprazole (PROTONIX) IV  40 mg Intravenous Q12H  . simvastatin  20 mg Oral q1800  . terazosin  10 mg Oral QHS   Continuous Infusions:

## 2015-03-23 NOTE — Progress Notes (Signed)
Patient ID: [name], male   DOB: 09-02-1930, 81 y.o.   MRN: [id number]     [facility]      [address] [place], [place] [address]    Phone: [phone] FAX: [phone]     Subjective: Feels better. Wants to eat.  WBC remains up, no LFTs today.   Objective:  Vital signs:  Filed Vitals:   03/22/15 0929 03/22/15 1705 03/22/15 2035 03/23/15 0426  BP: 177/44 166/43 187/34 138/48  Pulse: 81 81 84 81  Temp: 99.1 F (37.3 C) 98.6 F (37 C) 98.1 F (36.7 C) 98.3 F (36.8 C)  TempSrc: Oral Oral Oral Oral  Resp: _0 Height:      Weight:    73.2 kg (161 lb 6 oz)  SpO2: 96% 96% 92% 93%    Last BM Date: 03/21/15  Intake/Output   Yesterday:  05/24 0701 - 05/25 0700 In: 2062.5 [P.O.:600; I.V.:1162.5; IV Piggyback:300] Out: 1200 [Urine:1200] This shift:    I/O last 3 completed shifts: In: 3192.5 [P.O.:1080; I.V.:1812.5; IV Piggyback:300] Out: 2800 [Urine:2800]    Physical Exam: General: Pt awake/alert/oriented x4 in no acute distress  Abdomen: Soft.  Nondistended. ttp RUQ/epigstric region.   No evidence of peritonitis.  No incarcerated hernias.   Problem List:   Principal Problem:   Abdominal pain Active Problems:   Hypertension   Peripheral arterial disease   Renal failure (ARF), acute on chronic   Diabetes mellitus type 2, controlled   Epigastric pain   Elevated liver enzymes   Abdominal pain, acute, right upper quadrant   Elevated troponin    Results:   Labs: Results for orders placed or performed during the hospital encounter of 03/21/15 (from the past 48 hour(s))  CBC with Differential     Status: Abnormal   Collection Time: 03/21/15  3:01 PM  Result Value Ref Range   WBC 14.2 (H) 4.0 - 10.5 K/uL   RBC 4.20 (L) 4.22 - 5.81 MIL/uL   Hemoglobin 11.9 (L) 13.0 - 17.0 g/dL   HCT 35.9 (L) 39.0 - 52.0 %   MCV 85.5 78.0 - 100.0 fL   MCH 28.3 26.0 - 34.0 pg   MCHC 33.1 30.0 - 36.0 g/dL   RDW  16.7 (H) 11.5 - 15.5 %   Platelets 219 150 - 400 K/uL   Neutrophils Relative % 89 (H) 43 - 77 %   Neutro Abs 12.6 (H) 1.7 - 7.7 K/uL   Lymphocytes Relative 7 (L) 12 - 46 %   Lymphs Abs 1.0 0.7 - 4.0 K/uL   Monocytes Relative 4 3 - 12 %   Monocytes Absolute 0.6 0.1 - 1.0 K/uL   Eosinophils Relative 0 0 - 5 %   Eosinophils Absolute 0.0 0.0 - 0.7 K/uL   Basophils Relative 0 0 - 1 %   Basophils Absolute 0.0 0.0 - 0.1 K/uL  Comprehensive metabolic panel     Status: Abnormal   Collection Time: 03/21/15  3:01 PM  Result Value Ref Range   Sodium 137 135 - 145 mmol/L   Potassium 3.5 3.5 - 5.1 mmol/L   Chloride 91 (L) 101 - 111 mmol/L   CO2 30 22 - 32 mmol/L   Glucose, Bld 219 (H) 65 - 99 mg/dL   BUN 117 (H) 6 - 20 mg/dL   Creatinine, Ser 3.70 (H) 0.61 - 1.24 mg/dL   Calcium 9.6 8.9 - 10.3 mg/dL   Total Protein 6.6  6.5 - 8.1 g/dL   Albumin 3.7 3.5 - 5.0 g/dL   AST 25 15 - 41 U/L   ALT 23 17 - 63 U/L   Alkaline Phosphatase 85 38 - 126 U/L   Total Bilirubin 0.9 0.3 - 1.2 mg/dL   GFR calc non Af Amer 14 (L) >60 mL/min   GFR calc Af Amer 16 (L) >60 mL/min    Comment: (NOTE) The eGFR has been calculated using the CKD EPI equation. This calculation has not been validated in all clinical situations. eGFR's persistently <60 mL/min signify possible Chronic Kidney Disease.    Anion gap 16 (H) 5 - 15  Lipase, blood     Status: None   Collection Time: 03/21/15  3:01 PM  Result Value Ref Range   Lipase 27 22 - 51 U/L  I-stat troponin, ED (only if pt is 79 y.o. or older & pain is above umbilicus)  not at [facility]     Status: None   Collection Time: 03/21/15  3:12 PM  Result Value Ref Range   Troponin i, poc 0.02 0.00 - 0.08 ng/mL   Comment 3            Comment: Due to the release kinetics of cTnI, a negative result within the first hours of the onset of symptoms does not rule out myocardial infarction with certainty. If myocardial infarction is still suspected, repeat the test at  appropriate intervals.   Urinalysis, Routine w reflex microscopic     Status: Abnormal   Collection Time: 03/21/15  4:00 PM  Result Value Ref Range   Color, Urine YELLOW YELLOW   APPearance CLEAR CLEAR   Specific Gravity, Urine 1.010 1.005 - 1.030   pH 5.0 5.0 - 8.0   Glucose, UA NEGATIVE NEGATIVE mg/dL   Hgb urine dipstick NEGATIVE NEGATIVE   Bilirubin Urine NEGATIVE NEGATIVE   Ketones, ur NEGATIVE NEGATIVE mg/dL   Protein, ur 100 (A) NEGATIVE mg/dL   Urobilinogen, UA 0.2 0.0 - 1.0 mg/dL   Nitrite NEGATIVE NEGATIVE   Leukocytes, UA NEGATIVE NEGATIVE  Urine microscopic-add on     Status: Abnormal   Collection Time: 03/21/15  4:00 PM  Result Value Ref Range   Squamous Epithelial / LPF RARE RARE   WBC, UA 0-2 <3 WBC/hpf   RBC / HPF 0-2 <3 RBC/hpf   Casts HYALINE CASTS (A) NEGATIVE    Comment: GRANULAR CAST  CBG monitoring, ED     Status: Abnormal   Collection Time: 03/21/15  8:19 PM  Result Value Ref Range   Glucose-Capillary 212 (H) 65 - 99 mg/dL  Glucose, capillary     Status: Abnormal   Collection Time: 03/21/15 10:17 PM  Result Value Ref Range   Glucose-Capillary 199 (H) 65 - 99 mg/dL  Troponin I (q 6hr x 3)     Status: Abnormal   Collection Time: 03/21/15 10:38 PM  Result Value Ref Range   Troponin I 0.05 (H) <0.031 ng/mL    Comment:        PERSISTENTLY INCREASED TROPONIN VALUES IN THE RANGE OF 0.04-0.49 ng/mL CAN BE SEEN IN:       -UNSTABLE ANGINA       -CONGESTIVE HEART FAILURE       -MYOCARDITIS       -CHEST TRAUMA       -ARRYHTHMIAS       -LATE PRESENTING MYOCARDIAL INFARCTION       -COPD   CLINICAL FOLLOW-UP RECOMMENDED.   Lactic acid,  plasma     Status: None   Collection Time: 03/21/15 10:38 PM  Result Value Ref Range   Lactic Acid, Venous 1.3 0.5 - 2.0 mmol/L  Troponin I (q 6hr x 3)     Status: Abnormal   Collection Time: 03/22/15  4:22 AM  Result Value Ref Range   Troponin I 0.07 (H) <0.031 ng/mL    Comment:        PERSISTENTLY INCREASED  TROPONIN VALUES IN THE RANGE OF 0.04-0.49 ng/mL CAN BE SEEN IN:       -UNSTABLE ANGINA       -CONGESTIVE HEART FAILURE       -MYOCARDITIS       -CHEST TRAUMA       -ARRYHTHMIAS       -LATE PRESENTING MYOCARDIAL INFARCTION       -COPD   CLINICAL FOLLOW-UP RECOMMENDED.   Hepatic function panel     Status: Abnormal   Collection Time: 03/22/15  4:22 AM  Result Value Ref Range   Total Protein 5.9 (L) 6.5 - 8.1 g/dL   Albumin 3.2 (L) 3.5 - 5.0 g/dL   AST 372 (H) 15 - 41 U/L   ALT 292 (H) 17 - 63 U/L   Alkaline Phosphatase 318 (H) 38 - 126 U/L   Total Bilirubin 1.2 0.3 - 1.2 mg/dL   Bilirubin, Direct 0.4 0.1 - 0.5 mg/dL   Indirect Bilirubin 0.8 0.3 - 0.9 mg/dL  Lipase, blood     Status: None   Collection Time: 03/22/15  4:22 AM  Result Value Ref Range   Lipase 25 22 - 51 U/L  CBC with Differential/Platelet     Status: Abnormal   Collection Time: 03/22/15  4:22 AM  Result Value Ref Range   WBC 16.1 (H) 4.0 - 10.5 K/uL   RBC 3.98 (L) 4.22 - 5.81 MIL/uL   Hemoglobin 11.0 (L) 13.0 - 17.0 g/dL   HCT 33.8 (L) 39.0 - 52.0 %   MCV 84.9 78.0 - 100.0 fL   MCH 27.6 26.0 - 34.0 pg   MCHC 32.5 30.0 - 36.0 g/dL   RDW 16.6 (H) 11.5 - 15.5 %   Platelets 221 150 - 400 K/uL   Neutrophils Relative % 87 (H) 43 - 77 %   Neutro Abs 14.0 (H) 1.7 - 7.7 K/uL   Lymphocytes Relative 4 (L) 12 - 46 %   Lymphs Abs 0.6 (L) 0.7 - 4.0 K/uL   Monocytes Relative 9 3 - 12 %   Monocytes Absolute 1.5 (H) 0.1 - 1.0 K/uL   Eosinophils Relative 0 0 - 5 %   Eosinophils Absolute 0.0 0.0 - 0.7 K/uL   Basophils Relative 0 0 - 1 %   Basophils Absolute 0.0 0.0 - 0.1 K/uL  Basic metabolic panel     Status: Abnormal   Collection Time: 03/22/15  4:22 AM  Result Value Ref Range   Sodium 138 135 - 145 mmol/L   Potassium 3.4 (L) 3.5 - 5.1 mmol/L   Chloride 92 (L) 101 - 111 mmol/L   CO2 31 22 - 32 mmol/L   Glucose, Bld 182 (H) 65 - 99 mg/dL   BUN 114 (H) 6 - 20 mg/dL   Creatinine, Ser 3.61 (H) 0.61 - 1.24 mg/dL    Calcium 9.4 8.9 - 10.3 mg/dL   GFR calc non Af Amer 14 (L) >60 mL/min   GFR calc Af Amer 17 (L) >60 mL/min    Comment: (NOTE) The eGFR has been calculated  using the CKD EPI equation. This calculation has not been validated in all clinical situations. eGFR's persistently <60 mL/min signify possible Chronic Kidney Disease.    Anion gap 15 5 - 15  Glucose, capillary     Status: Abnormal   Collection Time: 03/22/15  7:58 AM  Result Value Ref Range   Glucose-Capillary 190 (H) 65 - 99 mg/dL  Troponin I (q 6hr x 3)     Status: Abnormal   Collection Time: 03/22/15 11:08 AM  Result Value Ref Range   Troponin I 0.09 (H) <0.031 ng/mL    Comment:        PERSISTENTLY INCREASED TROPONIN VALUES IN THE RANGE OF 0.04-0.49 ng/mL CAN BE SEEN IN:       -UNSTABLE ANGINA       -CONGESTIVE HEART FAILURE       -MYOCARDITIS       -CHEST TRAUMA       -ARRYHTHMIAS       -LATE PRESENTING MYOCARDIAL INFARCTION       -COPD   CLINICAL FOLLOW-UP RECOMMENDED.   Glucose, capillary     Status: Abnormal   Collection Time: 03/22/15 11:44 AM  Result Value Ref Range   Glucose-Capillary 173 (H) 65 - 99 mg/dL  Hemoglobin A1c     Status: Abnormal   Collection Time: 03/22/15  4:28 PM  Result Value Ref Range   Hgb A1c MFr Bld 7.4 (H) 4.8 - 5.6 %    Comment: (NOTE)         Pre-diabetes: 5.7 - 6.4         Diabetes: >6.4         Glycemic control for adults with diabetes: <7.0    Mean Plasma Glucose 166 mg/dL    Comment: (NOTE) Performed At: [facility] [place], [place] [id number] Lindon Romp MD UY:[id number]   Glucose, capillary     Status: Abnormal   Collection Time: 03/22/15  4:53 PM  Result Value Ref Range   Glucose-Capillary 202 (H) 65 - 99 mg/dL  Glucose, capillary     Status: Abnormal   Collection Time: 03/22/15  8:32 PM  Result Value Ref Range   Glucose-Capillary 216 (H) 65 - 99 mg/dL  CBC     Status: Abnormal   Collection Time: 03/23/15  5:55 AM  Result Value Ref  Range   WBC 16.4 (H) 4.0 - 10.5 K/uL   RBC 3.60 (L) 4.22 - 5.81 MIL/uL   Hemoglobin 10.1 (L) 13.0 - 17.0 g/dL   HCT 30.9 (L) 39.0 - 52.0 %   MCV 85.8 78.0 - 100.0 fL   MCH 28.1 26.0 - 34.0 pg   MCHC 32.7 30.0 - 36.0 g/dL   RDW 17.4 (H) 11.5 - 15.5 %   Platelets 184 150 - 400 K/uL  Basic metabolic panel     Status: Abnormal   Collection Time: 03/23/15  5:55 AM  Result Value Ref Range   Sodium 136 135 - 145 mmol/L   Potassium 3.6 3.5 - 5.1 mmol/L   Chloride 95 (L) 101 - 111 mmol/L   CO2 30 22 - 32 mmol/L   Glucose, Bld 148 (H) 65 - 99 mg/dL   BUN 117 (H) 6 - 20 mg/dL   Creatinine, Ser 3.76 (H) 0.61 - 1.24 mg/dL   Calcium 8.8 (L) 8.9 - 10.3 mg/dL   GFR calc non Af Amer 14 (L) >60 mL/min   GFR calc Af Amer 16 (L) >60 mL/min    Comment: (  NOTE) The eGFR has been calculated using the CKD EPI equation. This calculation has not been validated in all clinical situations. eGFR's persistently <60 mL/min signify possible Chronic Kidney Disease.    Anion gap 11 5 - 15    Imaging / Studies: Ct Abdomen Pelvis Wo Contrast  03/21/2015   CLINICAL DATA:  Epigastric abdominal pain since Saturday.  EXAM: CT ABDOMEN AND PELVIS WITHOUT CONTRAST  TECHNIQUE: Multidetector CT imaging of the abdomen and pelvis was performed following the standard protocol without IV contrast.  COMPARISON:  None.  FINDINGS: Lower chest: The lung bases are clear of acute process. There is a small to moderate-sized hiatal hernia containing contrast. Three-vessel coronary artery calcifications are noted.  Hepatobiliary: No focal hepatic lesions or intrahepatic biliary dilatation. A small gallstone is noted in the gallbladder. No common bile duct dilatation.  Pancreas: Normal.  Spleen: Normal size.  No focal lesions.  Adrenals/Urinary Tract: The adrenal glands are normal. Renal pelvic lipomatosis is noted but no renal calculi or obstructing ureteral calculi. No renal mass. No bladder calculi. Mild bladder distention. The prostate  gland is mildly enlarged. The seminal vesicles are normal.  Stomach/Bowel: The stomach, duodenum, small bowel and colon are unremarkable. No inflammatory changes, mass lesions or obstructive findings. The terminal ileum is normal. The appendix is normal.  Vascular/Lymphatic: No mesenteric or retroperitoneal mass or adenopathy. Small scattered lymph nodes are noted. There are severe aortic and branch vessel calcifications but no focal aneurysm.  Other: No pelvic mass or adenopathy. No free pelvic fluid collections. No inguinal mass or adenopathy.  Musculoskeletal: No significant bony findings. Moderate degenerative changes involving the spine.  IMPRESSION: 1. Severe atherosclerotic calcifications involving the aorta and branch vessels. 2. Moderate-sized hiatal hernia with reflux of oral contrast into the distal esophagus. 3. Cholelithiasis. 4. No acute abdominal/pelvic findings, mass lesions or adenopathy.   Electronically Signed   By: Marijo Sanes M.D.   On: 03/21/2015 19:07   Dg Chest 2 View  03/21/2015   CLINICAL DATA:  SOB all the time, abdominal pain with weakness x 2 days. Hx of COPD, HTN, diabetes.  EXAM: CHEST  2 VIEW  COMPARISON:  08/27/2012  FINDINGS: The heart size and mediastinal contours are within normal limits. Mild stable scarring at the lung apices. Lungs otherwise clear. No pleural effusion or pneumothorax. The visualized skeletal structures show no acute findings.  IMPRESSION: No active cardiopulmonary disease.   Electronically Signed   By: Lajean Manes M.D.   On: 03/21/2015 16:51   Mr Abdomen Mrcp Wo Cm  03/22/2015   CLINICAL DATA:  Abnormal liver function tests.  Gallstones.  EXAM: MRI ABDOMEN WITHOUT CONTRAST  (INCLUDING MRCP)  TECHNIQUE: Multiplanar multisequence MR imaging of the abdomen was performed. Heavily T2-weighted images of the biliary and pancreatic ducts were obtained, and three-dimensional MRCP images were rendered by post processing.  COMPARISON:  03/21/2015  FINDINGS:  Lower chest: There is no pleural effusion identified. No pericardial effusion.  Hepatobiliary: No focal liver abnormality identified. There is diffuse low signal on the T1 and T2 weighted sequences suggesting diffuse iron deposition. Small stone within the dependent portion of the gallbladder measures 5 mm. No gallbladder wall thickening or pericholecystic fluid. No biliary dilatation or evidence of choledocholithiasis.  Pancreas: There is no focal pancreas abnormality.  Spleen: No focal spleen abnormality.  Adrenals/Urinary Tract: The adrenal glands are both normal. There is bilateral and symmetric renal cortical thinning. No obstructive uropathy or mass noted.  Stomach/Bowel: The stomach and the small  bowel loops are on unremarkable. The visualized portions of the colon are within normal limits.  Vascular/Lymphatic: Normal appearance of the abdominal aorta. No upper abdominal adenopathy noted.  Other: Insert ascites stress set no free fluid or fluid collections identified.  Musculoskeletal: There is diffuse low signal throughout the bone marrow.  IMPRESSION: 1. No acute findings. 2. Gallstone. No evidence for choledocholithiasis or biliary obstruction. 3. Diffuse low signal within the liver, spleen and bone marrow which may be seen with abnormal iron deposition.   Electronically Signed   By: Kerby Moors M.D.   On: 03/22/2015 15:43   Mr 3d Recon At Scanner  03/22/2015   CLINICAL DATA:  Abnormal liver function tests.  Gallstones.  EXAM: MRI ABDOMEN WITHOUT CONTRAST  (INCLUDING MRCP)  TECHNIQUE: Multiplanar multisequence MR imaging of the abdomen was performed. Heavily T2-weighted images of the biliary and pancreatic ducts were obtained, and three-dimensional MRCP images were rendered by post processing.  COMPARISON:  03/21/2015  FINDINGS: Lower chest: There is no pleural effusion identified. No pericardial effusion.  Hepatobiliary: No focal liver abnormality identified. There is diffuse low signal on the T1  and T2 weighted sequences suggesting diffuse iron deposition. Small stone within the dependent portion of the gallbladder measures 5 mm. No gallbladder wall thickening or pericholecystic fluid. No biliary dilatation or evidence of choledocholithiasis.  Pancreas: There is no focal pancreas abnormality.  Spleen: No focal spleen abnormality.  Adrenals/Urinary Tract: The adrenal glands are both normal. There is bilateral and symmetric renal cortical thinning. No obstructive uropathy or mass noted.  Stomach/Bowel: The stomach and the small bowel loops are on unremarkable. The visualized portions of the colon are within normal limits.  Vascular/Lymphatic: Normal appearance of the abdominal aorta. No upper abdominal adenopathy noted.  Other: Insert ascites stress set no free fluid or fluid collections identified.  Musculoskeletal: There is diffuse low signal throughout the bone marrow.  IMPRESSION: 1. No acute findings. 2. Gallstone. No evidence for choledocholithiasis or biliary obstruction. 3. Diffuse low signal within the liver, spleen and bone marrow which may be seen with abnormal iron deposition.   Electronically Signed   By: Kerby Moors M.D.   On: 03/22/2015 15:43    Medications / Allergies:  Scheduled Meds: . allopurinol  100 mg Oral Daily  . amLODipine  10 mg Oral q morning - 10a  . aspirin EC  81 mg Oral QPM  . calcitRIOL  0.25 mcg Oral q morning - 10a  . cholecalciferol  1,000 Units Oral BID  . cloNIDine  0.1 mg Oral TID  . clopidogrel  75 mg Oral Daily  . enoxaparin (LOVENOX) injection  30 mg Subcutaneous Q24H  . feeding supplement (RESOURCE BREEZE)  1 Container Oral TID BM  . hydrALAZINE  50 mg Oral 3 times per day  . insulin aspart  0-9 Units Subcutaneous TID WC  . linagliptin  5 mg Oral Daily  . pantoprazole (PROTONIX) IV  40 mg Intravenous Q12H  . simvastatin  20 mg Oral q1800  . terazosin  10 mg Oral QHS   Continuous Infusions:  PRN Meds:.acetaminophen **OR** acetaminophen,  hydrALAZINE, morphine injection, ondansetron **OR** ondansetron (ZOFRAN) IV  Antibiotics: Anti-infectives    None        Assessment/Plan Cholelithiasis possible cholecystitis  Likely a passed CBD stone  Leukocytosis  Await HIDA scan Hold Plvavix If HIDA is positive, perc chole drain per patient and family wishes, hope to avoid surgery NPO LFTs tomorrow  Erby Pian, [facility] Surgery Pager  [phone](7A-4:30P) For consults and floor pages call [phone](7A-4:30P)  03/23/2015  7:58 AM

## 2015-03-23 NOTE — Progress Notes (Signed)
Echocardiogram 2D Echocardiogram has been performed.  [name], [name] 03/23/2015, 12:50 PM

## 2015-03-24 DIAGNOSIS — E119 Type 2 diabetes mellitus without complications: Secondary | ICD-10-CM

## 2015-03-24 LAB — BASIC METABOLIC PANEL
Anion gap: 12 (ref 5–15)
BUN: 127 mg/dL — ABNORMAL HIGH (ref 6–20)
CO2: 28 mmol/L (ref 22–32)
Calcium: 8.7 mg/dL — ABNORMAL LOW (ref 8.9–10.3)
Chloride: 99 mmol/L — ABNORMAL LOW (ref 101–111)
Creatinine, Ser: 3.87 mg/dL — ABNORMAL HIGH (ref 0.61–1.24)
GFR, EST AFRICAN AMERICAN: 15 mL/min — AB (ref >60)
GFR, EST NON AFRICAN AMERICAN: 13 mL/min — AB (ref >60)
GLUCOSE: 187 mg/dL — AB (ref 65–99)
POTASSIUM: 3.2 mmol/L — AB (ref 3.5–5.1)
Sodium: 139 mmol/L (ref 135–145)

## 2015-03-24 LAB — HEPATIC FUNCTION PANEL
ALK PHOS: 193 U/L — AB (ref 38–126)
ALT: 104 U/L — ABNORMAL HIGH (ref 17–63)
AST: 43 U/L — AB (ref 15–41)
Albumin: 2.5 g/dL — ABNORMAL LOW (ref 3.5–5.0)
Bilirubin, Direct: 0.4 mg/dL (ref 0.1–0.5)
Indirect Bilirubin: 0.5 mg/dL (ref 0.3–0.9)
TOTAL PROTEIN: 5.3 g/dL — AB (ref 6.5–8.1)
Total Bilirubin: 0.9 mg/dL (ref 0.3–1.2)

## 2015-03-24 LAB — CBC
HEMATOCRIT: 31.3 % — AB (ref 39.0–52.0)
Hemoglobin: 10.1 g/dL — ABNORMAL LOW (ref 13.0–17.0)
MCH: 27.8 pg (ref 26.0–34.0)
MCHC: 32.3 g/dL (ref 30.0–36.0)
MCV: 86.2 fL (ref 78.0–100.0)
Platelets: 180 10*3/uL (ref 150–400)
RBC: 3.63 MIL/uL — ABNORMAL LOW (ref 4.22–5.81)
RDW: 17.3 % — AB (ref 11.5–15.5)
WBC: 14.3 10*3/uL — ABNORMAL HIGH (ref 4.0–10.5)

## 2015-03-24 LAB — GLUCOSE, CAPILLARY
GLUCOSE-CAPILLARY: 140 mg/dL — AB (ref 65–99)
Glucose-Capillary: 230 mg/dL — ABNORMAL HIGH (ref 65–99)
Glucose-Capillary: 229 mg/dL — ABNORMAL HIGH (ref 65–99)
Glucose-Capillary: 182 mg/dL — ABNORMAL HIGH (ref 65–99)

## 2015-03-24 LAB — HEMOGLOBIN A1C
Hgb A1c MFr Bld: 7.2 % — ABNORMAL HIGH (ref 4.8–5.6)
MEAN PLASMA GLUCOSE: 160 mg/dL

## 2015-03-24 MED ORDER — POTASSIUM CHLORIDE CRYS ER 20 MEQ PO TBCR
20.0000 meq | EXTENDED_RELEASE_TABLET | Freq: Once | ORAL | Status: AC
  Administered 2015-03-24: 20 meq via ORAL
  Filled 2015-03-24: qty 1

## 2015-03-24 MED ORDER — PIPERACILLIN-TAZOBACTAM IN DEX 2-0.25 GM/50ML IV SOLN
2.2500 g | Freq: Three times a day (TID) | INTRAVENOUS | Status: DC
  Administered 2015-03-24 – 2015-03-29 (×15): 2.25 g via INTRAVENOUS
  Filled 2015-03-24 (×21): qty 50

## 2015-03-24 NOTE — Progress Notes (Signed)
81yo male to change from Unasyn to Zosyn for acute cholecystitis.  Will start Zosyn 2.25g IV Q8H and monitor CBC and Cx.  Vernard GamblesVeronda Scottlynn Lindell, PharmD, BCPS 03/24/2015 7:25 AM

## 2015-03-24 NOTE — Progress Notes (Signed)
Chaplain followed-up with patient.  Patient requested a visit tomorrow after his surgery.  Spoke with his nurse [place] and she thinks it maybe be scheduled for the afternoon.   She requested maybe checking in on him before and after if possible.  Follow-up needed   03/24/15 1500  Clinical Encounter Type  Visited With Patient;Health care provider  Visit Type Follow-up;Spiritual support  Referral From Nurse  Spiritual Encounters  Spiritual Needs Prayer

## 2015-03-24 NOTE — Consult Note (Signed)
Referring Provider: No ref. provider found Primary Care Physician:  Bryan RaiderSHAW,[name], MD Primary Nephrologist:  Dr Deterding  Reason for Consultation:   Chronic renal disease stage 4  Hypertension and anemia  HPI:  Admitted with acute cholecystitis and history of hypertension Hyperlipidemia and asthma. He has a history of nephrotic syndrome and takes lasix and metolazone  His last EF was 55 % He has hypertension and does not take an ACE or ARB He does not smoke  Lives with daughter Worked at [employer]  Baseline creatinine 3   Past Medical History  Diagnosis Date  . Diabetes mellitus   . Hypertension   . Hypercholesteremia   . Gout   . PVD (peripheral vascular disease) 7/05    LCE, known 60% RICA 7/12  . CAD (coronary artery disease) 2002    non critical  . Macular degeneration   . Chronic renal disease, stage III   . Claudication   . Palpitations     PVCs and PSVT on Holter monitoring  . Bilateral carotid artery disease     status post left carotid endarterectomy performed by Dr. Madilyn FiremanHayes 05/12/04  . Anemia     Past Surgical History  Procedure Laterality Date  . 2d echocardiogram  03/31/2008    EF greater than 55%  . Cardiovascular stress test  06/30/2010    Nonischemic. Low risk  . Cerebral angiogram  04/03/2004    High-grade 80% ostial L ICA stenosis. Medical management.  . Abdominal aortogram  09/01/2007    Widely patent renal arteries. Medical management.  . Peripheral vascular angiogram  05/07/2011    No evidence of intracranial occlusions, stenosis, dissections, or aneurysms seen  . Carotid doppler  06/09/2012    R Vertebral-known occluded vessel, R Bulb/Proximal ICA-moderate to severe amount of plaque w/50-69% diameter reduction, L Subclavian-50-69% diameter reduction, L CEA-demonstrated increased velocities w/o evidence of hemodynamically significant stenosis  . Cardiac catheterization  10/17/2001    No significant CAD, normal LV systolic function.  . Carotid  endarterectomy  July 2005  . Lower extremity angiogram Bilateral 11/01/2014    Procedure: LOWER EXTREMITY ANGIOGRAM;  Surgeon: Runell GessJonathan J Berry, MD;  [facility];  Service: Cardiovascular;  Laterality: Bilateral;  . Abdominal angiogram  11/01/2014    Procedure: ABDOMINAL ANGIOGRAM;  Surgeon: Runell GessJonathan J Berry, MD;  [facility];  Service: Cardiovascular;;    Prior to Admission medications   Medication Sig Start Date End Date Taking? Authorizing Provider  allopurinol (ZYLOPRIM) 100 MG tablet TAKE 1 TABLET (100 MG TOTAL) BY MOUTH AT BEDTIME. 01/27/15  Yes Rhonda G Barrett, PA-C  amLODipine (NORVASC) 10 MG tablet Take 1 tablet by mouth every morning.  05/27/13  Yes Historical Provider, MD  aspirin EC 81 MG tablet Take 81 mg by mouth every evening.   Yes Historical Provider, MD  calcitRIOL (ROCALTROL) 0.25 MCG capsule Take 0.25 mcg by mouth every morning.  05/27/13  Yes Historical Provider, MD  cholecalciferol (VITAMIN D) 1000 UNITS tablet Take 1,000 Units by mouth 2 (two) times daily.    Yes Historical Provider, MD  cloNIDine (CATAPRES) 0.1 MG tablet Take 1 tablet (0.1 mg total) by mouth 3 (three) times daily. 11/04/14  Yes Rhonda G Barrett, PA-C  clopidogrel (PLAVIX) 75 MG tablet TAKE 1 TABLET BY MOUTH DAILY 11/01/14  Yes Runell GessJonathan J Berry, MD  furosemide (LASIX) 80 MG tablet Take 1 tablet (80 mg total) by mouth daily. HOLD 48 hours, resume on 11/07/2014. Patient taking differently: Take 80 mg by  mouth daily. Take 2 tablets oral three times daily 11/04/14  Yes Rhonda G Barrett, PA-C  hydrALAZINE (APRESOLINE) 50 MG tablet Take 1 tablet (50 mg total) by mouth every 8 (eight) hours. 11/04/14  Yes Rhonda G Barrett, PA-C  metolazone (ZAROXOLYN) 5 MG tablet Take 5 mg by mouth daily.   Yes Historical Provider, MD  Multiple Vitamins-Minerals (PRESERVISION AREDS PO) Take 2 tablets by mouth daily.   Yes Historical Provider, MD  simvastatin (ZOCOR) 20 MG tablet Take 1 tablet (20 mg total) by mouth daily at  6 PM. 11/09/14  Yes [name], MD  sitaGLIPtin (JANUVIA) 100 MG tablet Take 100 mg by mouth every morning. Doctor gave him samples for right now per family member   Yes Historical Provider, MD  terazosin (HYTRIN) 10 MG capsule TAKE 1 CAPSULE (10 MG TOTAL) BY MOUTH AT BEDTIME. 11/24/14  Yes [name], MD    Current Facility-Administered Medications  Medication Dose Route Frequency Provider Last Rate Last Dose  . 0.9 %  sodium chloride infusion   Intravenous Continuous Belkys A Regalado, MD   Stopped at 03/24/15 0132  . acetaminophen (TYLENOL) tablet 650 mg  650 mg Oral Q6H PRN Eduard Clos, MD       Or  . acetaminophen (TYLENOL) suppository 650 mg  650 mg Rectal Q6H PRN Eduard Clos, MD      . allopurinol (ZYLOPRIM) tablet 100 mg  100 mg Oral Daily Eduard Clos, MD   100 mg at 03/23/15 1646  . amLODipine (NORVASC) tablet 10 mg  10 mg Oral q morning - 10a Eduard Clos, MD   10 mg at 03/23/15 1645  . aspirin EC tablet 81 mg  81 mg Oral QPM Eduard Clos, MD   81 mg at 03/23/15 1645  . calcitRIOL (ROCALTROL) capsule 0.25 mcg  0.25 mcg Oral q morning - 10a Eduard Clos, MD   0.25 mcg at 03/23/15 1649  . cholecalciferol (VITAMIN D) tablet 1,000 Units  1,000 Units Oral BID Eduard Clos, MD   1,000 Units at 03/23/15 2126  . cloNIDine (CATAPRES) tablet 0.1 mg  0.1 mg Oral TID Eduard Clos, MD   0.1 mg at 03/23/15 2126  . enoxaparin (LOVENOX) injection 30 mg  30 mg Subcutaneous Q24H Ann Held, RPH   30 mg at 03/23/15 1658  . feeding supplement (RESOURCE BREEZE) (RESOURCE BREEZE) liquid 1 Container  1 Container Oral TID BM Eduard Clos, MD   1 Container at 03/23/15 1954  . hydrALAZINE (APRESOLINE) injection 5 mg  5 mg Intravenous Q4H PRN Eduard Clos, MD   5 mg at 03/22/15 2051  . hydrALAZINE (APRESOLINE) tablet 50 mg  50 mg Oral 3 times per day Eduard Clos, MD   50 mg at 03/24/15 0601  . insulin aspart  (novoLOG) injection 0-9 Units  0-9 Units Subcutaneous TID WC Eduard Clos, MD   3 Units at 03/24/15 0810  . morphine 2 MG/ML injection 1 mg  1 mg Intravenous Q4H PRN Eduard Clos, MD   1 mg at 03/21/15 2242  . ondansetron (ZOFRAN) tablet 4 mg  4 mg Oral Q6H PRN Eduard Clos, MD       Or  . ondansetron [facility]) injection 4 mg  4 mg Intravenous Q6H PRN Eduard Clos, MD      . pantoprazole (PROTONIX) injection 40 mg  40 mg Intravenous Q12H Eduard Clos, MD   40 mg  at 03/23/15 2126  . piperacillin-tazobactam (ZOSYN) IVPB 2.25 g  2.25 g Intravenous 3 times per day Juliette Mangle, RPH   2.25 g at 03/24/15 1610  . terazosin (HYTRIN) capsule 10 mg  10 mg Oral QHS Eduard Clos, MD   10 mg at 03/23/15 2126    Allergies as of 03/21/2015 - Review Complete 03/21/2015  Allergen Reaction Noted  . Labetalol Swelling 11/02/2014    Family History  Problem Relation Age of Onset  . Stroke Sister     History   Social History  . Marital Status: Widowed    Spouse Name: N/A  . Number of Children: N/A  . Years of Education: N/A   Occupational History  . Not on file.   Social History Main Topics  . Smoking status: Former Smoker -- 1.00 packs/day for 40 years  . Smokeless tobacco: Never Used     Comment: quit approx. 20 years ago.  . Alcohol Use: No  . Drug Use: Not on file  . Sexual Activity: No   Other Topics Concern  . Not on file   Social History Narrative    Review of Systems: Gen:  +, weakness, malaise HEENT: No visual complaints, No history of Retinopathy. Normal external appearance No Epistaxis or Sore throat. No sinusitis.   CV: Denies chest pain, angina, palpitations, syncope, orthopnea, PND, peripheral edema, and claudication. Resp: Denies dyspnea at rest, dyspnea with exercise, cough, sputum, wheezing, coughing up blood, and pleurisy. GI: Denies vomiting blood, jaundice, and fecal incontinence.   Denies dysphagia or odynophagia. GU :  Denies urinary burning, blood in urine, urinary frequency, urinary hesitancy, nocturnal urination, and urinary incontinence.  No renal calculi. MS: Denies joint pain, limitation of movement, and swelling, stiffness, low back pain, extremity pain. Denies muscle weakness, cramps, atrophy.  No use of non steroidal antiinflammatory drugs. Derm: Denies rash, itching, dry skin, hives, moles, warts, or unhealing ulcers.  Psych: Denies depression, anxiety, memory loss, suicidal ideation, hallucinations, paranoia, and confusion. Heme: Denies bruising, bleeding, and enlarged lymph nodes. Neuro: No headache.  No diplopia. No dysarthria.  No dysphasia.  No history of CVA.  No Seizures. No paresthesias.  No weakness. Endocrine diabetes.  No Thyroid disease.  No Adrenal disease.  Physical Exam: Vital signs in last 24 hours: Temp:  [98 F (36.7 C)-99.2 F (37.3 C)] 98 F (36.7 C) (05/26 1000) Pulse Rate:  [52-82] 74 (05/26 1000) Resp:  [17-18] 18 (05/26 1000) BP: (158-198)/(33-48) 168/48 mmHg (05/26 1000) SpO2:  [92 %-98 %] 98 % (05/26 1000) Weight:  [73.1 kg (161 lb 2.5 oz)] 73.1 kg (161 lb 2.5 oz) (05/26 0538) Last BM Date: 03/21/15 General:   Elderly  Head:  Normocephalic and atraumatic. Eyes:  Sclera clear, no icterus.   Conjunctiva pink. Ears:  Normal auditory acuity. Nose:  No deformity, discharge,  or lesions. Mouth:  No deformity or lesions, dentition normal. Neck:  Supple; no masses or thyromegaly. JVP not elevated Lungs:  Clear throughout to auscultation.   No wheezes, crackles, or rhonchi. No acute distress. Heart:  Regular rate and rhythm; no murmurs, clicks, rubs,  or gallops. Abdomen:  Soft, nontender and nondistended. No masses, hepatosplenomegaly or hernias noted. Normal bowel sounds, without guarding, and without rebound.   Msk:  Symmetrical without gross deformities. Normal posture. Pulses:  No carotid, renal, femoral bruits. DP and PT symmetrical and equal Extremities:  Without  clubbing or edema. Neurologic:  Alert and  oriented x4;  grossly normal neurologically. Skin:  Intact without significant lesions or rashes. Cervical Nodes:  No significant cervical adenopathy. Psych:  Alert and cooperative. Normal mood and affect.  Intake/Output from previous day: 05/25 0701 - 05/26 0700 In: 0  Out: 625 [Urine:625] Intake/Output this shift:    Lab Results:  Recent Labs  03/22/15 0422 03/23/15 0555 03/24/15 0531  WBC 16.1* 16.4* 14.3*  HGB 11.0* 10.1* 10.1*  HCT 33.8* 30.9* 31.3*  PLT 221 184 180   BMET  Recent Labs  03/22/15 0422 03/23/15 0555 03/24/15 0531  NA 138 136 139  K 3.4* 3.6 3.2*  CL 92* 95* 99*  CO2 GLUCOSE 182* 148* 187*  BUN 114* 117* 127*  CREATININE 3.61* 3.76* 3.87*  CALCIUM 9.4 8.8* 8.7*   LFT  Recent Labs  03/24/15 0531  PROT 5.3*  ALBUMIN 2.5*  AST 43*  ALT 104*  ALKPHOS 193*  BILITOT 0.9  BILIDIR 0.4  IBILI 0.5   PT/INR No results for input(s): LABPROT, INR in the last 72 hours. Hepatitis Panel No results for input(s): HEPBSAG, HCVAB, HEPAIGM, HEPBIGM in the last 72 hours.  Studies/Results: Nm Hepatobiliary Including Gb  03/23/2015   CLINICAL DATA:  Epigastric abdominal pain over the past 5 days.  EXAM: NUCLEAR MEDICINE HEPATOBILIARY IMAGING  TECHNIQUE: Sequential images of the abdomen were obtained out to 60 minutes following intravenous administration of radiopharmaceutical. At this point, 2.9 mg of intravenous morphine were administered and imaging was carried out an additional 30 min.  RADIOPHARMACEUTICALS:  5.2 mCi Tc-19m Choletec IV  COMPARISON:  No prior nuclear imaging. MRCP yesterday. CT abdomen and pelvis 03/21/2015.  FINDINGS: Hepatic uptake of Choletec is normal. Intra and extrahepatic bile ducts are not visible until approximately 20 min. Duodenal activity is visible at 25 min. At no point during the original 60 minutes of imaging did the gallbladder become visible. There is evidence of tracer  retention by the liver.  The gallbladder was not visualized after morphine administration. Reflux of activity into the stomach did occur.  IMPRESSION: 1. Cystic duct obstruction consistent with acute cholecystitis, despite the negative imaging studies thus far. The gallbladder was not visualized even after administration of intravenous morphine. 2. Cholestasis, with tracer retention by the liver. This is consistent with hepatocellular disease as there is no evidence of common bile duct obstruction. Please correlate with liver function tests. These results will be called to the ordering clinician or representative by the Radiologist Assistant, and communication documented in the PACS or zVision Dashboard.   Electronically Signed   By: Hulan Saas M.D.   On: 03/23/2015 14:50   Mr Abdomen Mrcp Wo Cm  03/22/2015   CLINICAL DATA:  Abnormal liver function tests.  Gallstones.  EXAM: MRI ABDOMEN WITHOUT CONTRAST  (INCLUDING MRCP)  TECHNIQUE: Multiplanar multisequence MR imaging of the abdomen was performed. Heavily T2-weighted images of the biliary and pancreatic ducts were obtained, and three-dimensional MRCP images were rendered by post processing.  COMPARISON:  03/21/2015  FINDINGS: Lower chest: There is no pleural effusion identified. No pericardial effusion.  Hepatobiliary: No focal liver abnormality identified. There is diffuse low signal on the T1 and T2 weighted sequences suggesting diffuse iron deposition. Small stone within the dependent portion of the gallbladder measures 5 mm. No gallbladder wall thickening or pericholecystic fluid. No biliary dilatation or evidence of choledocholithiasis.  Pancreas: There is no focal pancreas abnormality.  Spleen: No focal spleen abnormality.  Adrenals/Urinary Tract: The adrenal glands are both normal. There is bilateral and symmetric renal cortical  thinning. No obstructive uropathy or mass noted.  Stomach/Bowel: The stomach and the small bowel loops are on  unremarkable. The visualized portions of the colon are within normal limits.  Vascular/Lymphatic: Normal appearance of the abdominal aorta. No upper abdominal adenopathy noted.  Other: Insert ascites stress set no free fluid or fluid collections identified.  Musculoskeletal: There is diffuse low signal throughout the bone marrow.  IMPRESSION: 1. No acute findings. 2. Gallstone. No evidence for choledocholithiasis or biliary obstruction. 3. Diffuse low signal within the liver, spleen and bone marrow which may be seen with abnormal iron deposition.   Electronically Signed   By: Signa Kell M.D.   On: 03/22/2015 15:43   Mr 3d Recon At Scanner  03/22/2015   CLINICAL DATA:  Abnormal liver function tests.  Gallstones.  EXAM: MRI ABDOMEN WITHOUT CONTRAST  (INCLUDING MRCP)  TECHNIQUE: Multiplanar multisequence MR imaging of the abdomen was performed. Heavily T2-weighted images of the biliary and pancreatic ducts were obtained, and three-dimensional MRCP images were rendered by post processing.  COMPARISON:  03/21/2015  FINDINGS: Lower chest: There is no pleural effusion identified. No pericardial effusion.  Hepatobiliary: No focal liver abnormality identified. There is diffuse low signal on the T1 and T2 weighted sequences suggesting diffuse iron deposition. Small stone within the dependent portion of the gallbladder measures 5 mm. No gallbladder wall thickening or pericholecystic fluid. No biliary dilatation or evidence of choledocholithiasis.  Pancreas: There is no focal pancreas abnormality.  Spleen: No focal spleen abnormality.  Adrenals/Urinary Tract: The adrenal glands are both normal. There is bilateral and symmetric renal cortical thinning. No obstructive uropathy or mass noted.  Stomach/Bowel: The stomach and the small bowel loops are on unremarkable. The visualized portions of the colon are within normal limits.  Vascular/Lymphatic: Normal appearance of the abdominal aorta. No upper abdominal adenopathy  noted.  Other: Insert ascites stress set no free fluid or fluid collections identified.  Musculoskeletal: There is diffuse low signal throughout the bone marrow.  IMPRESSION: 1. No acute findings. 2. Gallstone. No evidence for choledocholithiasis or biliary obstruction. 3. Diffuse low signal within the liver, spleen and bone marrow which may be seen with abnormal iron deposition.   Electronically Signed   By: Signa Kell M.D.   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   HTN appears euvolemic  And agree with holding diuretics  Anemia stable  Bones calcitriol  Corrected calcium 9's  Lipids controlled  Diabetes contolled   LOS: 3 [name] W @TODAY @11 :13 AM

## 2015-03-24 NOTE — H&P (Signed)
Chief Complaint: Chief Complaint  Patient presents with  . Abdominal Pain  Acute Cholecystitis  Referring Physician(s): Dr. Jimmye Norman Ashok Norris, NP  History of Present Illness: [name] is a 82 y.o. male with a history of DM CKD stage 3-4, HTN, HLD, PVD s/p stenting to LLE, on plavix, CAD, presented with epigastric abdominal pain.   Onset Was sudden on Saturday.  On Sunday morning, he had a [employer] biscuit and his pain promptly worsened. Location is epigastric region and without radiation.   Currently he denies fever, chills, sweats. Denies diarrhea or constipation.   He has been on IV Zosyn and states his abdominal pain is better.  He is on Plavix secondary to Iliac angioplasty/stent done in January.  Last dose of plavix was 5/24.   He is currently tolerating a clear liquid diet.   CT of abdomen and pelvis significant for cholelithiasis.   The was followed by a MRCP which was negative for a CBD stone, but once again demonstrated gallstones.   We have therefore been asked to evaluate pt for percutaneous cholecystostomy.  Past Medical History  Diagnosis Date  . Diabetes mellitus   . Hypertension   . Hypercholesteremia   . Gout   . PVD (peripheral vascular disease) 7/05    LCE, known 60% RICA 7/12  . CAD (coronary artery disease) 2002    non critical  . Macular degeneration   . Chronic renal disease, stage III   . Claudication   . Palpitations     PVCs and PSVT on Holter monitoring  . Bilateral carotid artery disease     status post left carotid endarterectomy performed by Dr. Madilyn Fireman 05/12/04  . Anemia     Past Surgical History  Procedure Laterality Date  . 2d echocardiogram  03/31/2008    EF greater than 55%  . Cardiovascular stress test  06/30/2010    Nonischemic. Low risk  . Cerebral angiogram  04/03/2004    High-grade 80% ostial L ICA stenosis. Medical management.  . Abdominal aortogram  09/01/2007    Widely patent renal arteries.  Medical management.  . Peripheral vascular angiogram  05/07/2011    No evidence of intracranial occlusions, stenosis, dissections, or aneurysms seen  . Carotid doppler  06/09/2012    R Vertebral-known occluded vessel, R Bulb/Proximal ICA-moderate to severe amount of plaque w/50-69% diameter reduction, L Subclavian-50-69% diameter reduction, L CEA-demonstrated increased velocities w/o evidence of hemodynamically significant stenosis  . Cardiac catheterization  10/17/2001    No significant CAD, normal LV systolic function.  . Carotid endarterectomy  July 2005  . Lower extremity angiogram Bilateral 11/01/2014    Procedure: LOWER EXTREMITY ANGIOGRAM;  Surgeon: [name], MD;  Location: [facility] CATH LAB;  Service: Cardiovascular;  Laterality: Bilateral;  . Abdominal angiogram  11/01/2014    Procedure: ABDOMINAL ANGIOGRAM;  Surgeon: [name], MD;  Location: [facility] CATH LAB;  Service: Cardiovascular;;    Allergies: Labetalol  Medications: Prior to Admission medications   Medication Sig Start Date End Date Taking? Authorizing Provider  allopurinol (ZYLOPRIM) 100 MG tablet TAKE 1 TABLET (100 MG TOTAL) BY MOUTH AT BEDTIME. 01/27/15  Yes Rhonda G Barrett, PA-C  amLODipine (NORVASC) 10 MG tablet Take 1 tablet by mouth every morning.  05/27/13  Yes Historical Provider, MD  aspirin EC 81 MG tablet Take 81 mg by mouth every evening.   Yes Historical Provider, MD  calcitRIOL (ROCALTROL) 0.25 MCG capsule Take 0.25 mcg by mouth every morning.  05/27/13  Yes Historical Provider, MD  cholecalciferol (VITAMIN D) 1000 UNITS tablet Take 1,000 Units by mouth 2 (two) times daily.    Yes Historical Provider, MD  cloNIDine (CATAPRES) 0.1 MG tablet Take 1 tablet (0.1 mg total) by mouth 3 (three) times daily. 11/04/14  Yes Rhonda G Barrett, PA-C  clopidogrel (PLAVIX) 75 MG tablet TAKE 1 TABLET BY MOUTH DAILY 11/01/14  Yes Runell GessJonathan J Berry, MD  furosemide (LASIX) 80 MG tablet Take 1 tablet (80 mg total) by mouth daily. HOLD 48  hours, resume on 11/07/2014. Patient taking differently: Take 80 mg by mouth daily. Take 2 tablets oral three times daily 11/04/14  Yes Rhonda G Barrett, PA-C  hydrALAZINE (APRESOLINE) 50 MG tablet Take 1 tablet (50 mg total) by mouth every 8 (eight) hours. 11/04/14  Yes Rhonda G Barrett, PA-C  metolazone (ZAROXOLYN) 5 MG tablet Take 5 mg by mouth daily.   Yes Historical Provider, MD  Multiple Vitamins-Minerals (PRESERVISION AREDS PO) Take 2 tablets by mouth daily.   Yes Historical Provider, MD  simvastatin (ZOCOR) 20 MG tablet Take 1 tablet (20 mg total) by mouth daily at 6 PM. 11/09/14  Yes Runell GessJonathan J Berry, MD  sitaGLIPtin (JANUVIA) 100 MG tablet Take 100 mg by mouth every morning. Doctor gave him samples for right now per family member   Yes Historical Provider, MD  terazosin (HYTRIN) 10 MG capsule TAKE 1 CAPSULE (10 MG TOTAL) BY MOUTH AT BEDTIME. 11/24/14  Yes Runell GessJonathan J Berry, MD     Family History  Problem Relation Age of Onset  . Stroke Sister     History   Social History  . Marital Status: Widowed    Spouse Name: N/A  . Number of Children: N/A  . Years of Education: N/A   Social History Main Topics  . Smoking status: Former Smoker -- 1.00 packs/day for 40 years  . Smokeless tobacco: Never Used     Comment: quit approx. 20 years ago.  . Alcohol Use: No  . Drug Use: Not on file  . Sexual Activity: No   Other Topics Concern  . None   Social History Narrative    Review of Systems: A 12 point ROS discussed and pertinent positives are indicated in the HPI above.  All other systems are negative.  Review of Systems  Constitutional: Negative for fever, chills, activity change and appetite change.  HENT: Negative.   Respiratory: Negative for cough, chest tightness and shortness of breath.   Cardiovascular: Negative for chest pain.  Gastrointestinal: Negative for nausea, vomiting and abdominal pain.  Genitourinary: Negative.   Musculoskeletal: Negative.   Skin: Negative.     Neurological: Negative.   Psychiatric/Behavioral: Negative.     Vital Signs: BP 198/33 mmHg  Pulse 82  Temp(Src) 98 F (36.7 C) (Oral)  Resp 18  Ht 5\' 6"  (1.676 m)  Wt 161 lb 2.5 oz (73.1 kg)  BMI 26.02 kg/m2  SpO2 96%  Physical Exam  Constitutional: He is oriented to person, place, and time. He appears well-developed and well-nourished.  HENT:  Head: Normocephalic and atraumatic.  Eyes: EOM are normal.  Neck: Neck supple.  Cardiovascular: Normal rate, regular rhythm and normal heart sounds.   Pulmonary/Chest: Effort normal and breath sounds normal. He has no wheezes.  Abdominal: Soft. Bowel sounds are normal. He exhibits no mass. There is no tenderness.  Musculoskeletal: Normal range of motion.  Neurological: He is alert and oriented to person, place, and time.  Skin: Skin is warm and  dry.  Psychiatric: He has a normal mood and affect. His behavior is normal. Judgment and thought content normal.  Vitals reviewed.   Mallampati Score:  MD Evaluation Airway: WNL Heart: WNL Abdomen: WNL Chest/ Lungs: WNL ASA  Classification: 3 Mallampati/Airway Score: One  Imaging: Ct Abdomen Pelvis Wo Contrast  03/21/2015   CLINICAL DATA:  Epigastric abdominal pain since Saturday.  EXAM: CT ABDOMEN AND PELVIS WITHOUT CONTRAST  TECHNIQUE: Multidetector CT imaging of the abdomen and pelvis was performed following the standard protocol without IV contrast.  COMPARISON:  None.  FINDINGS: Lower chest: The lung bases are clear of acute process. There is a small to moderate-sized hiatal hernia containing contrast. Three-vessel coronary artery calcifications are noted.  Hepatobiliary: No focal hepatic lesions or intrahepatic biliary dilatation. A small gallstone is noted in the gallbladder. No common bile duct dilatation.  Pancreas: Normal.  Spleen: Normal size.  No focal lesions.  Adrenals/Urinary Tract: The adrenal glands are normal. Renal pelvic lipomatosis is noted but no renal calculi or  obstructing ureteral calculi. No renal mass. No bladder calculi. Mild bladder distention. The prostate gland is mildly enlarged. The seminal vesicles are normal.  Stomach/Bowel: The stomach, duodenum, small bowel and colon are unremarkable. No inflammatory changes, mass lesions or obstructive findings. The terminal ileum is normal. The appendix is normal.  Vascular/Lymphatic: No mesenteric or retroperitoneal mass or adenopathy. Small scattered lymph nodes are noted. There are severe aortic and branch vessel calcifications but no focal aneurysm.  Other: No pelvic mass or adenopathy. No free pelvic fluid collections. No inguinal mass or adenopathy.  Musculoskeletal: No significant bony findings. Moderate degenerative changes involving the spine.  IMPRESSION: 1. Severe atherosclerotic calcifications involving the aorta and branch vessels. 2. Moderate-sized hiatal hernia with reflux of oral contrast into the distal esophagus. 3. Cholelithiasis. 4. No acute abdominal/pelvic findings, mass lesions or adenopathy.   Electronically Signed   By: Rudie Meyer M.D.   On: 03/21/2015 19:07   Dg Chest 2 View  03/21/2015   CLINICAL DATA:  SOB all the time, abdominal pain with weakness x 2 days. Hx of COPD, HTN, diabetes.  EXAM: CHEST  2 VIEW  COMPARISON:  08/27/2012  FINDINGS: The heart size and mediastinal contours are within normal limits. Mild stable scarring at the lung apices. Lungs otherwise clear. No pleural effusion or pneumothorax. The visualized skeletal structures show no acute findings.  IMPRESSION: No active cardiopulmonary disease.   Electronically Signed   By: Amie Portland M.D.   On: 03/21/2015 16:51   Nm Hepatobiliary Including Gb  03/23/2015   CLINICAL DATA:  Epigastric abdominal pain over the past 5 days.  EXAM: NUCLEAR MEDICINE HEPATOBILIARY IMAGING  TECHNIQUE: Sequential images of the abdomen were obtained out to 60 minutes following intravenous administration of radiopharmaceutical. At this point, 2.9  mg of intravenous morphine were administered and imaging was carried out an additional 30 min.  RADIOPHARMACEUTICALS:  5.2 mCi Tc-29m Choletec IV  COMPARISON:  No prior nuclear imaging. MRCP yesterday. CT abdomen and pelvis 03/21/2015.  FINDINGS: Hepatic uptake of Choletec is normal. Intra and extrahepatic bile ducts are not visible until approximately 20 min. Duodenal activity is visible at 25 min. At no point during the original 60 minutes of imaging did the gallbladder become visible. There is evidence of tracer retention by the liver.  The gallbladder was not visualized after morphine administration. Reflux of activity into the stomach did occur.  IMPRESSION: 1. Cystic duct obstruction consistent with acute cholecystitis, despite the negative imaging  studies thus far. The gallbladder was not visualized even after administration of intravenous morphine. 2. Cholestasis, with tracer retention by the liver. This is consistent with hepatocellular disease as there is no evidence of common bile duct obstruction. Please correlate with liver function tests. These results will be called to the ordering clinician or representative by the Radiologist Assistant, and communication documented in the PACS or zVision Dashboard.   Electronically Signed   By: Hulan Saas M.D.   On: 03/23/2015 14:50   Mr Abdomen Mrcp Wo Cm  03/22/2015   CLINICAL DATA:  Abnormal liver function tests.  Gallstones.  EXAM: MRI ABDOMEN WITHOUT CONTRAST  (INCLUDING MRCP)  TECHNIQUE: Multiplanar multisequence MR imaging of the abdomen was performed. Heavily T2-weighted images of the biliary and pancreatic ducts were obtained, and three-dimensional MRCP images were rendered by post processing.  COMPARISON:  03/21/2015  FINDINGS: Lower chest: There is no pleural effusion identified. No pericardial effusion.  Hepatobiliary: No focal liver abnormality identified. There is diffuse low signal on the T1 and T2 weighted sequences suggesting diffuse iron  deposition. Small stone within the dependent portion of the gallbladder measures 5 mm. No gallbladder wall thickening or pericholecystic fluid. No biliary dilatation or evidence of choledocholithiasis.  Pancreas: There is no focal pancreas abnormality.  Spleen: No focal spleen abnormality.  Adrenals/Urinary Tract: The adrenal glands are both normal. There is bilateral and symmetric renal cortical thinning. No obstructive uropathy or mass noted.  Stomach/Bowel: The stomach and the small bowel loops are on unremarkable. The visualized portions of the colon are within normal limits.  Vascular/Lymphatic: Normal appearance of the abdominal aorta. No upper abdominal adenopathy noted.  Other: Insert ascites stress set no free fluid or fluid collections identified.  Musculoskeletal: There is diffuse low signal throughout the bone marrow.  IMPRESSION: 1. No acute findings. 2. Gallstone. No evidence for choledocholithiasis or biliary obstruction. 3. Diffuse low signal within the liver, spleen and bone marrow which may be seen with abnormal iron deposition.   Electronically Signed   By: Signa Kell M.D.   On: 03/22/2015 15:43   Mr 3d Recon At Scanner  03/22/2015   CLINICAL DATA:  Abnormal liver function tests.  Gallstones.  EXAM: MRI ABDOMEN WITHOUT CONTRAST  (INCLUDING MRCP)  TECHNIQUE: Multiplanar multisequence MR imaging of the abdomen was performed. Heavily T2-weighted images of the biliary and pancreatic ducts were obtained, and three-dimensional MRCP images were rendered by post processing.  COMPARISON:  03/21/2015  FINDINGS: Lower chest: There is no pleural effusion identified. No pericardial effusion.  Hepatobiliary: No focal liver abnormality identified. There is diffuse low signal on the T1 and T2 weighted sequences suggesting diffuse iron deposition. Small stone within the dependent portion of the gallbladder measures 5 mm. No gallbladder wall thickening or pericholecystic fluid. No biliary dilatation or  evidence of choledocholithiasis.  Pancreas: There is no focal pancreas abnormality.  Spleen: No focal spleen abnormality.  Adrenals/Urinary Tract: The adrenal glands are both normal. There is bilateral and symmetric renal cortical thinning. No obstructive uropathy or mass noted.  Stomach/Bowel: The stomach and the small bowel loops are on unremarkable. The visualized portions of the colon are within normal limits.  Vascular/Lymphatic: Normal appearance of the abdominal aorta. No upper abdominal adenopathy noted.  Other: Insert ascites stress set no free fluid or fluid collections identified.  Musculoskeletal: There is diffuse low signal throughout the bone marrow.  IMPRESSION: 1. No acute findings. 2. Gallstone. No evidence for choledocholithiasis or biliary obstruction. 3. Diffuse low signal within  the liver, spleen and bone marrow which may be seen with abnormal iron deposition.   Electronically Signed   By: Signa Kell M.D.   On: 03/22/2015 15:43    Labs:  CBC:  Recent Labs  03/21/15 1501 03/22/15 0422 03/23/15 0555 03/24/15 0531  WBC 14.2* 16.1* 16.4* 14.3*  HGB 11.9* 11.0* 10.1* 10.1*  HCT 35.9* 33.8* 30.9* 31.3*  PLT 219 221 184 180    COAGS:  Recent Labs  11/01/14 0429  INR 1.07    BMP:  Recent Labs  03/21/15 1501 03/22/15 0422 03/23/15 0555 03/24/15 0531  NA 137 138 136 139  K 3.5 3.4* 3.6 3.2*  CL 91* 92* 95* 99*  CO2 30 31 30 28   GLUCOSE 219* 182* 148* 187*  BUN 117* 114* 117* 127*  CALCIUM 9.6 9.4 8.8* 8.7*  CREATININE 3.70* 3.61* 3.76* 3.87*  GFRNONAA 14* 14* 14* 13*  GFRAA 16* 17* 16* 15*    LIVER FUNCTION TESTS:  Recent Labs  06/22/14 0814 03/21/15 1501 03/22/15 0422 03/24/15 0531  BILITOT 0.4 0.9 1.2 0.9  AST 16 25 372* 43*  ALT 11 23 292* 104*  ALKPHOS 74 85 318* 193*  PROT 5.4* 6.6 5.9* 5.3*  ALBUMIN 3.6 3.7 3.2* 2.5*     Assessment and Plan:  Acute Cholecystitis Plavix therapy for Iliac stent - Being Held, last dose 5/24 Will  plan for Percutanous Cholecystostomy tomorrow 5/27  Risks and Benefits discussed with the patient including bleeding, infection, damage to adjacent structures, bowel perforation/fistula connection, and sepsis. All of the patient's questions were answered, patient is agreeable to proceed. Consent signed and in chart.   Thank you for this interesting consult.  I greatly enjoyed meeting [name] and look forward to participating in their care.  SignedGolden Pop 03/24/2015, 8:26 AM   I spent a total of 20 Minutes    in face to face in clinical consultation, greater than 50% of which was counseling/coordinating care for percutaneous cholecystostomy.

## 2015-03-24 NOTE — Progress Notes (Signed)
Patient ID: [name], male   DOB: May 14, 1930, 81 y.o.   MRN: [id number] [facility] PROGRESS NOTE  [name] WUJ:[id number] DOB: 1930-01-19 DOA: 03/21/2015 PCP: Bryan Raider, MD  Brief narrative:    81 y.o. male with past medical history of peripheral vascular disease status post left lower extremity stenting, chronic kidney disease stage 3-4, hypertension, hyperlipidemia, asthma who presented to [facility] ED with worsening intermittent epigastric pain for past few days prior to this admission. Pain is described as gnawing, worse with eating so this led to patient having poor po intake over past day or so. His pain is resolved at this point.   On admission, his BP was 197/45, afebrile and oxygen saturation 90-97% on room air. Blood work was notable for leukocytosis of 14.2, hemoglobin of 11.9, potassium of 3.5, creatinine of 3.7. Of note, per patient he just saw nephrologist who stopped lasix and zaroxolyn due to significant weight loss over past 6 months. CT abdomen and pelvis on this admission showed cholelithiasis and hiatal hernia with reflux. LFT's were WNL on this admission. Lipase was WNL.   His hospital course is slightly complicated wit mild troponin elevation for which reason we consulted cardiology.    Assessment/Plan:    HPI/Subjective: Feeling ok this morning, alert and oriented to place and situation.  Denies abdominal pain. Tolerating clear diet.   Possible Cholecystitis:  Epigastric abdominal pain, Transaminases;  - In pt with cholelithiasis but no evidence of cholecystitis.  Lipase is WNL. - Pt does have leukocytosis but no fevers. - GI has seen the pt in consultation. Plan is for MRCP. - Of note, he is on aspirin and plavix because of extensive PVD, CAD. Still on both aspirin and plavix unless GI says to hold.  -MRCP; Gallstone. No evidence of choledocholithiasis or biliary obstruction.  -surgery consulted, recommending HIDA scan.  -hold statin due to elevated LFT.  Repeat labs in am./  -HIDA scan consistent with cholecystitis.   Essential hypertension - Continue Norvasc, clonidine, hydralazine - Order placed for hydralazine PRN if BP above 150/90  Peripheral arterial disease - Continue aspirin , plavix on hold for procedure tomorrow.   Renal failure (ARF), acute on chronic, stage 4 - Creatinine 3.7 on this admission - Recent baseline in 10/2014 was 3.09.  -Continue to hold lasix and zaroxolyn -received IV fluids yesterday for 8 hours.  -BUN and Cr worse today, urine out put has also decrease. . Will consult renal.   Leukocytosis; secondary to cholecystitis.  UA negative, chest x ary negative,.  HIDA scan positive for cholecystitis.  Trending down on Zosyn.   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   Troponin elevation - Likely demand ischemia from CKD - Cardio consulted - No acute ischemic changes on 12 lead EKG - No complaints of chest pain - Continue aspirin holding plavix for procedure tomorrow.   Dyslipidemia -hold statin due to elevated LFT>    DVT Prophylaxis  - On Lovenox subQ in hospital    Code Status: Full.  Family Communication:  plan of care discussed with the patient Disposition Plan: Awaiting cholecystostomy tube placement.   IV access:  Peripheral IV  Procedures and diagnostic studies:    Ct Abdomen Pelvis Wo Contrast 03/21/2015  1. Severe atherosclerotic calcifications involving the aorta and branch vessels. 2. Moderate-sized hiatal hernia with reflux of oral contrast into the distal esophagus. 3. Cholelithiasis. 4. No acute abdominal/pelvic findings, mass lesions or adenopathy.   Electronically Signed   By: Rudie MeyerP.  Gallerani M.D.   On: 03/21/2015 19:07   Dg Chest 2  View 03/21/2015    No active cardiopulmonary disease.   Electronically Signed   By: Amie Portlandavid  Ormond M.D.   On: 03/21/2015 16:51   Medical Consultants:  Gastroenterology Cardiology   Other Consultants:  None   IAnti-Infectives:   None    [name], [name], MD  [facility] Pager [phone]  Time spent in minutes: 25 minutes  If 7PM-7AM, please contact night-coverage [email or web address] Password [email or web address] 03/24/2015, 9:42 AM   LOS: 3 days     Objective: Filed Vitals:   03/23/15 0954 03/23/15 1710 03/23/15 2049 03/24/15 0538  BP: 179/48 181/33 158/36 198/33  Pulse: 83 52 82 82  Temp: 98.4 F (36.9 C) 98.9 F (37.2 C) 99.2 F (37.3 C) 98 F (36.7 C)  TempSrc: Oral Oral Oral Oral  Resp: 18 18 17 18   Height:      Weight:    73.1 kg (161 lb 2.5 oz)  SpO2: 96% 94% 92% 96%    Intake/Output Summary (Last 24 hours) at 03/24/15 0942 Last data filed at 03/24/15 [id number]  Gross per 24 hour  Intake      0 ml  Output    375 ml  Net   -375 ml    Exam:   General:  Pt is alert, follows commands appropriately, not in acute distress  Cardiovascular: Regular rate and rhythm, S1/S2 (+)  Respiratory: Clear to auscultation bilaterally, no wheezing, no crackles, no rhonchi  Abdomen: Soft, mild right UQ  tender, non distended, bowel sounds present  Extremities: No edema, pulses DP and PT palpable bilaterally  Neuro: Grossly nonfocal  Data Reviewed: Basic Metabolic Panel:  Recent Labs Lab 03/21/15 1501 03/22/15 0422 03/23/15 0555 03/24/15 0531  NA 137 138 136 139  K 3.5 3.4* 3.6 3.2*  CL 91* 92* 95* 99*  CO2 30 31 30 28   GLUCOSE 219* 182* 148* 187*  BUN 117* 114* 117* 127*  CREATININE 3.70* 3.61* 3.76* 3.87*  CALCIUM 9.6 9.4 8.8* 8.7*   Liver Function Tests:  Recent Labs Lab 03/21/15 1501 03/22/15 0422 03/24/15 0531  AST 25 372* 43*  ALT 23 292* 104*  ALKPHOS 85 318* 193*  BILITOT 0.9 1.2 0.9  PROT 6.6 5.9* 5.3*  ALBUMIN 3.7 3.2* 2.5*    Recent Labs Lab  03/21/15 1501 03/22/15 0422  LIPASE 27 25   No results for input(s): AMMONIA in the last 168 hours. CBC:  Recent Labs Lab 03/21/15 1501 03/22/15 0422 03/23/15 0555 03/24/15 0531  WBC 14.2* 16.1* 16.4* 14.3*  NEUTROABS 12.6* 14.0*  --   --   HGB 11.9* 11.0* 10.1* 10.1*  HCT 35.9* 33.8* 30.9* 31.3*  MCV 85.5 84.9 85.8 86.2  PLT 219 221 184 180   Cardiac Enzymes:  Recent Labs Lab 03/21/15 2238 03/22/15 0422 03/22/15 1108  TROPONINI 0.05* 0.07* 0.09*   BNP: Invalid input(s): POCBNP CBG:  Recent Labs Lab 03/23/15 0813 03/23/15 1146 03/23/15 1641 03/23/15 2045 03/24/15 0805  GLUCAP 153* 130* 126* 322* 230*    No results found for this or any previous visit (from the past 240 hour(s)).   Scheduled Meds: . allopurinol  100 mg Oral Daily  . amLODipine  10 mg Oral  q morning - 10a  . aspirin EC  81 mg Oral QPM  . calcitRIOL  0.25 mcg Oral q morning - 10a  . cholecalciferol  1,000 Units Oral BID  . cloNIDine  0.1 mg Oral TID  . enoxaparin (LOVENOX) injection  30 mg Subcutaneous Q24H  . feeding supplement (RESOURCE BREEZE)  1 Container Oral TID BM  . hydrALAZINE  50 mg Oral 3 times per day  . insulin aspart  0-9 Units Subcutaneous TID WC  . linagliptin  5 mg Oral Daily  . pantoprazole (PROTONIX) IV  40 mg Intravenous Q12H  . piperacillin-tazobactam (ZOSYN)  IV  2.25 g Intravenous 3 times per day  . terazosin  10 mg Oral QHS   Continuous Infusions: . sodium chloride Stopped (03/24/15 0132)

## 2015-03-24 NOTE — Progress Notes (Signed)
Patient ID: [name], male   DOB: April 25, 1930, 81 y.o.   MRN: [id number]    Subjective: Pt feels ok.  Tolerating clear liquids  Objective: Vital signs in last 24 hours: Temp:  [98 F (36.7 C)-99.2 F (37.3 C)] 98 F (36.7 C) (05/26 0538) Pulse Rate:  [52-83] 82 (05/26 0538) Resp:  [17-18] 18 (05/26 0538) BP: (158-198)/(33-48) 198/33 mmHg (05/26 0538) SpO2:  [92 %-96 %] 96 % (05/26 0538) Weight:  [73.1 kg (161 lb 2.5 oz)] 73.1 kg (161 lb 2.5 oz) (05/26 0538) Last BM Date: 03/21/15  Intake/Output from previous day: 05/25 0701 - 05/26 0700 In: 0  Out: 625 [Urine:625] Intake/Output this shift:    PE: Abd: soft, tender in RUQ, +BS, ND Heart: regular Lungs: CTAB  Lab Results:   Recent Labs  03/23/15 0555 03/24/15 0531  WBC 16.4* 14.3*  HGB 10.1* 10.1*  HCT 30.9* 31.3*  PLT 184 180   BMET  Recent Labs  03/23/15 0555 03/24/15 0531  NA 136 139  K 3.6 3.2*  CL 95* 99*  CO2 30 28  GLUCOSE 148* 187*  BUN 117* 127*  CREATININE 3.76* 3.87*  CALCIUM 8.8* 8.7*   PT/INR No results for input(s): LABPROT, INR in the last 72 hours. CMP     Component Value Date/Time   NA 139 03/24/2015 0531   K 3.2* 03/24/2015 0531   CL 99* 03/24/2015 0531   CO2 28 03/24/2015 0531   GLUCOSE 187* 03/24/2015 0531   BUN 127* 03/24/2015 0531   CREATININE 3.87* 03/24/2015 0531   CALCIUM 8.7* 03/24/2015 0531   PROT 5.3* 03/24/2015 0531   ALBUMIN 2.5* 03/24/2015 0531   AST 43* 03/24/2015 0531   ALT 104* 03/24/2015 0531   ALKPHOS 193* 03/24/2015 0531   BILITOT 0.9 03/24/2015 0531   GFRNONAA 13* 03/24/2015 0531   GFRAA 15* 03/24/2015 0531   Lipase     Component Value Date/Time   LIPASE 25 03/22/2015 0422       Studies/Results: Nm Hepatobiliary Including Gb  03/23/2015   CLINICAL DATA:  Epigastric abdominal pain over the past 5 days.  EXAM: NUCLEAR MEDICINE HEPATOBILIARY IMAGING  TECHNIQUE: Sequential images of the abdomen were obtained out to 60 minutes following intravenous  administration of radiopharmaceutical. At this point, 2.9 mg of intravenous morphine were administered and imaging was carried out an additional 30 min.  RADIOPHARMACEUTICALS:  5.2 mCi Tc-75m Choletec IV  COMPARISON:  No prior nuclear imaging. MRCP yesterday. CT abdomen and pelvis 03/21/2015.  FINDINGS: Hepatic uptake of Choletec is normal. Intra and extrahepatic bile ducts are not visible until approximately 20 min. Duodenal activity is visible at 25 min. At no point during the original 60 minutes of imaging did the gallbladder become visible. There is evidence of tracer retention by the liver.  The gallbladder was not visualized after morphine administration. Reflux of activity into the stomach did occur.  IMPRESSION: 1. Cystic duct obstruction consistent with acute cholecystitis, despite the negative imaging studies thus far. The gallbladder was not visualized even after administration of intravenous morphine. 2. Cholestasis, with tracer retention by the liver. This is consistent with hepatocellular disease as there is no evidence of common bile duct obstruction. Please correlate with liver function tests. These results will be called to the ordering clinician or representative by the Radiologist Assistant, and communication documented in the PACS or zVision Dashboard.   Electronically Signed   By: Hulan Saas M.D.   On: 03/23/2015 14:50   Mr Abdomen Mrcp  Wo Cm  03/22/2015   CLINICAL DATA:  Abnormal liver function tests.  Gallstones.  EXAM: MRI ABDOMEN WITHOUT CONTRAST  (INCLUDING MRCP)  TECHNIQUE: Multiplanar multisequence MR imaging of the abdomen was performed. Heavily T2-weighted images of the biliary and pancreatic ducts were obtained, and three-dimensional MRCP images were rendered by post processing.  COMPARISON:  03/21/2015  FINDINGS: Lower chest: There is no pleural effusion identified. No pericardial effusion.  Hepatobiliary: No focal liver abnormality identified. There is diffuse low signal on  the T1 and T2 weighted sequences suggesting diffuse iron deposition. Small stone within the dependent portion of the gallbladder measures 5 mm. No gallbladder wall thickening or pericholecystic fluid. No biliary dilatation or evidence of choledocholithiasis.  Pancreas: There is no focal pancreas abnormality.  Spleen: No focal spleen abnormality.  Adrenals/Urinary Tract: The adrenal glands are both normal. There is bilateral and symmetric renal cortical thinning. No obstructive uropathy or mass noted.  Stomach/Bowel: The stomach and the small bowel loops are on unremarkable. The visualized portions of the colon are within normal limits.  Vascular/Lymphatic: Normal appearance of the abdominal aorta. No upper abdominal adenopathy noted.  Other: Insert ascites stress set no free fluid or fluid collections identified.  Musculoskeletal: There is diffuse low signal throughout the bone marrow.  IMPRESSION: 1. No acute findings. 2. Gallstone. No evidence for choledocholithiasis or biliary obstruction. 3. Diffuse low signal within the liver, spleen and bone marrow which may be seen with abnormal iron deposition.   Electronically Signed   By: Signa Kell M.D.   On: 03/22/2015 15:43   Mr 3d Recon At Scanner  03/22/2015   CLINICAL DATA:  Abnormal liver function tests.  Gallstones.  EXAM: MRI ABDOMEN WITHOUT CONTRAST  (INCLUDING MRCP)  TECHNIQUE: Multiplanar multisequence MR imaging of the abdomen was performed. Heavily T2-weighted images of the biliary and pancreatic ducts were obtained, and three-dimensional MRCP images were rendered by post processing.  COMPARISON:  03/21/2015  FINDINGS: Lower chest: There is no pleural effusion identified. No pericardial effusion.  Hepatobiliary: No focal liver abnormality identified. There is diffuse low signal on the T1 and T2 weighted sequences suggesting diffuse iron deposition. Small stone within the dependent portion of the gallbladder measures 5 mm. No gallbladder wall thickening  or pericholecystic fluid. No biliary dilatation or evidence of choledocholithiasis.  Pancreas: There is no focal pancreas abnormality.  Spleen: No focal spleen abnormality.  Adrenals/Urinary Tract: The adrenal glands are both normal. There is bilateral and symmetric renal cortical thinning. No obstructive uropathy or mass noted.  Stomach/Bowel: The stomach and the small bowel loops are on unremarkable. The visualized portions of the colon are within normal limits.  Vascular/Lymphatic: Normal appearance of the abdominal aorta. No upper abdominal adenopathy noted.  Other: Insert ascites stress set no free fluid or fluid collections identified.  Musculoskeletal: There is diffuse low signal throughout the bone marrow.  IMPRESSION: 1. No acute findings. 2. Gallstone. No evidence for choledocholithiasis or biliary obstruction. 3. Diffuse low signal within the liver, spleen and bone marrow which may be seen with abnormal iron deposition.   Electronically Signed   By: Signa Kell M.D.   On: 03/22/2015 15:43    Anti-infectives: Anti-infectives    Start     Dose/Rate Route Frequency Ordered Stop   03/24/15 0730  piperacillin-tazobactam (ZOSYN) IVPB 2.25 g     2.25 g 100 mL/hr over 30 Minutes Intravenous 3 times per day 03/24/15 0725     03/23/15 1930  ampicillin-sulbactam (UNASYN) 1.5 g in sodium  chloride 0.9 % 50 mL IVPB  Status:  Discontinued     1.5 g 100 mL/hr over 30 Minutes Intravenous Every 12 hours 03/23/15 1844 03/24/15 [id number]       Assessment/Plan   Cholelithiasis possible cholecystitis  Likely a passed CBD stone  Leukocytosis  -Hida positive,  plavix has been held.  Will plan for IR to place a perc chole drain tomorrow -cont zosyn -WBC down to 14K -cont clear liquids, NPO p MN for IR procedure tomorrow  LOS: 3 days    Neilan Rizzo E 03/24/2015, 9:19 AM Pager: [phone]

## 2015-03-25 ENCOUNTER — Inpatient Hospital Stay (HOSPITAL_COMMUNITY): Payer: Medicare Other

## 2015-03-25 DIAGNOSIS — R001 Bradycardia, unspecified: Secondary | ICD-10-CM

## 2015-03-25 DIAGNOSIS — I10 Essential (primary) hypertension: Secondary | ICD-10-CM

## 2015-03-25 LAB — BASIC METABOLIC PANEL
ANION GAP: 12 (ref 5–15)
BUN: 119 mg/dL — AB (ref 6–20)
CO2: 28 mmol/L (ref 22–32)
Calcium: 8.6 mg/dL — ABNORMAL LOW (ref 8.9–10.3)
Chloride: 100 mmol/L — ABNORMAL LOW (ref 101–111)
Creatinine, Ser: 3.85 mg/dL — ABNORMAL HIGH (ref 0.61–1.24)
GFR calc non Af Amer: 13 mL/min — ABNORMAL LOW (ref >60)
GFR, EST AFRICAN AMERICAN: 15 mL/min — AB (ref >60)
Glucose, Bld: 100 mg/dL — ABNORMAL HIGH (ref 65–99)
Potassium: 3.2 mmol/L — ABNORMAL LOW (ref 3.5–5.1)
SODIUM: 140 mmol/L (ref 135–145)

## 2015-03-25 LAB — GLUCOSE, CAPILLARY
GLUCOSE-CAPILLARY: 150 mg/dL — AB (ref 65–99)
GLUCOSE-CAPILLARY: 126 mg/dL — AB (ref 65–99)
GLUCOSE-CAPILLARY: 83 mg/dL (ref 65–99)
GLUCOSE-CAPILLARY: 316 mg/dL — AB (ref 65–99)

## 2015-03-25 LAB — COMPREHENSIVE METABOLIC PANEL WITH GFR
ALT: 73 U/L — ABNORMAL HIGH (ref 17–63)
AST: 32 U/L (ref 15–41)
Albumin: 2.3 g/dL — ABNORMAL LOW (ref 3.5–5.0)
Alkaline Phosphatase: 201 U/L — ABNORMAL HIGH (ref 38–126)
Anion gap: 12 (ref 5–15)
BUN: 115 mg/dL — ABNORMAL HIGH (ref 6–20)
CO2: 29 mmol/L (ref 22–32)
Calcium: 8.5 mg/dL — ABNORMAL LOW (ref 8.9–10.3)
Chloride: 97 mmol/L — ABNORMAL LOW (ref 101–111)
Creatinine, Ser: 3.84 mg/dL — ABNORMAL HIGH (ref 0.61–1.24)
GFR calc Af Amer: 15 mL/min — ABNORMAL LOW
GFR calc non Af Amer: 13 mL/min — ABNORMAL LOW
Glucose, Bld: 129 mg/dL — ABNORMAL HIGH (ref 65–99)
Potassium: 3.4 mmol/L — ABNORMAL LOW (ref 3.5–5.1)
Sodium: 138 mmol/L (ref 135–145)
Total Bilirubin: 0.8 mg/dL (ref 0.3–1.2)
Total Protein: 5.6 g/dL — ABNORMAL LOW (ref 6.5–8.1)

## 2015-03-25 LAB — CBC
HCT: 29.3 % — ABNORMAL LOW (ref 39.0–52.0)
Hemoglobin: 9.4 g/dL — ABNORMAL LOW (ref 13.0–17.0)
MCH: 27.4 pg (ref 26.0–34.0)
MCHC: 32.1 g/dL (ref 30.0–36.0)
MCV: 85.4 fL (ref 78.0–100.0)
Platelets: 183 10*3/uL (ref 150–400)
RBC: 3.43 MIL/uL — ABNORMAL LOW (ref 4.22–5.81)
RDW: 17.3 % — ABNORMAL HIGH (ref 11.5–15.5)
WBC: 12.2 10*3/uL — ABNORMAL HIGH (ref 4.0–10.5)

## 2015-03-25 LAB — MRSA PCR SCREENING: MRSA by PCR: NEGATIVE

## 2015-03-25 MED ORDER — ATROPINE SULFATE 0.1 MG/ML IJ SOLN
INTRAMUSCULAR | Status: AC
  Filled 2015-03-25: qty 10

## 2015-03-25 MED ORDER — MIDAZOLAM HCL 2 MG/2ML IJ SOLN
INTRAMUSCULAR | Status: AC
  Filled 2015-03-25: qty 2

## 2015-03-25 MED ORDER — ENOXAPARIN SODIUM 30 MG/0.3ML ~~LOC~~ SOLN: 30.0000 mg | SUBCUTANEOUS | Status: DC

## 2015-03-25 MED ORDER — FENTANYL CITRATE (PF) 100 MCG/2ML IJ SOLN
INTRAMUSCULAR | Status: AC
  Filled 2015-03-25: qty 2

## 2015-03-25 MED ORDER — LIDOCAINE HCL 1 % IJ SOLN
INTRAMUSCULAR | Status: AC
  Filled 2015-03-25: qty 20

## 2015-03-25 MED ORDER — ENOXAPARIN SODIUM 30 MG/0.3ML ~~LOC~~ SOLN
30.0000 mg | SUBCUTANEOUS | Status: DC
  Administered 2015-03-27 – 2015-03-29 (×3): 30 mg via SUBCUTANEOUS
  Filled 2015-03-25 (×3): qty 0.3

## 2015-03-25 MED ORDER — POTASSIUM CHLORIDE 10 MEQ/100ML IV SOLN
10.0000 meq | INTRAVENOUS | Status: AC
  Administered 2015-03-25 (×2): 10 meq via INTRAVENOUS
  Filled 2015-03-25 (×2): qty 100

## 2015-03-25 NOTE — Progress Notes (Signed)
Patient ID: [name], male   DOB: [birth year], 81 y.o.   MRN: [id number]    Subjective: Pt feels well.  Hungry.  No pain right now  Objective: Vital signs in last 24 hours: Temp:  [98.2 F (36.8 C)-99.6 F (37.6 C)] 98.2 F (36.8 C) (05/27 1000) Pulse Rate:  [63-69] 66 (05/27 1000) Resp:  [16-18] 18 (05/27 1000) BP: (140-177)/(32-44) 140/44 mmHg (05/27 1000) SpO2:  [95 %-96 %] 96 % (05/27 1000) Weight:  [73.4 kg (161 lb 13.1 oz)] 73.4 kg (161 lb 13.1 oz) (05/27 0451) Last BM Date: 03/21/15  Intake/Output from previous day: 05/26 0701 - 05/27 0700 In: 480 [P.O.:480] Out: 1150 [Urine:1150] Intake/Output this shift: Total I/O In: 0  Out: 150 [Urine:150]  PE: Abd: soft, mildly tender in RUQ, +BS, ND  Lab Results:   Recent Labs  03/24/15 0531 03/25/15 0612  WBC 14.3* 12.2*  HGB 10.1* 9.4*  HCT 31.3* 29.3*  PLT 180 183   BMET  Recent Labs  03/24/15 0531 03/25/15 0612  NA 139 138  K 3.2* 3.4*  CL 99* 97*  CO2 28 29  GLUCOSE 187* 129*  BUN 127* 115*  CREATININE 3.87* 3.84*  CALCIUM 8.7* 8.5*   PT/INR No results for input(s): LABPROT, INR in the last 72 hours. CMP     Component Value Date/Time   NA 138 03/25/2015 0612   K 3.4* 03/25/2015 0612   CL 97* 03/25/2015 0612   CO2 29 03/25/2015 0612   GLUCOSE 129* 03/25/2015 0612   BUN 115* 03/25/2015 0612   CREATININE 3.84* 03/25/2015 0612   CALCIUM 8.5* 03/25/2015 0612   PROT 5.6* 03/25/2015 0612   ALBUMIN 2.3* 03/25/2015 0612   AST 32 03/25/2015 0612   ALT 73* 03/25/2015 0612   ALKPHOS 201* 03/25/2015 0612   BILITOT 0.8 03/25/2015 0612   GFRNONAA 13* 03/25/2015 0612   GFRAA 15* 03/25/2015 0612   Lipase     Component Value Date/Time   LIPASE 25 03/22/2015 0422       Studies/Results: Nm Hepatobiliary Including Gb  03/23/2015   CLINICAL DATA:  Epigastric abdominal pain over the past 5 days.  EXAM: NUCLEAR MEDICINE HEPATOBILIARY IMAGING  TECHNIQUE: Sequential images of the abdomen were obtained  out to 60 minutes following intravenous administration of radiopharmaceutical. At this point, 2.9 mg of intravenous morphine were administered and imaging was carried out an additional 30 min.  RADIOPHARMACEUTICALS:  5.2 mCi Tc-55m Choletec IV  COMPARISON:  No prior nuclear imaging. MRCP yesterday. CT abdomen and pelvis 03/21/2015.  FINDINGS: Hepatic uptake of Choletec is normal. Intra and extrahepatic bile ducts are not visible until approximately 20 min. Duodenal activity is visible at 25 min. At no point during the original 60 minutes of imaging did the gallbladder become visible. There is evidence of tracer retention by the liver.  The gallbladder was not visualized after morphine administration. Reflux of activity into the stomach did occur.  IMPRESSION: 1. Cystic duct obstruction consistent with acute cholecystitis, despite the negative imaging studies thus far. The gallbladder was not visualized even after administration of intravenous morphine. 2. Cholestasis, with tracer retention by the liver. This is consistent with hepatocellular disease as there is no evidence of common bile duct obstruction. Please correlate with liver function tests. These results will be called to the ordering clinician or representative by the Radiologist Assistant, and communication documented in the PACS or zVision Dashboard.   Electronically Signed   By: Hulan Saas M.D.   On: 03/23/2015  14:50    Anti-infectives: Anti-infectives    Start     Dose/Rate Route Frequency Ordered Stop   03/24/15 0730  piperacillin-tazobactam (ZOSYN) IVPB 2.25 g     2.25 g 100 mL/hr over 30 Minutes Intravenous 3 times per day 03/24/15 0725     03/23/15 1930  ampicillin-sulbactam (UNASYN) 1.5 g in sodium chloride 0.9 % 50 mL IVPB  Status:  Discontinued     1.5 g 100 mL/hr over 30 Minutes Intravenous Every 12 hours 03/23/15 1844 03/24/15 0718       Assessment/Plan    Cholelithiasis possible cholecystitis  Likely a passed CBD  stone  Leukocytosis  -perc chole drain today -clear liquids adv as tolerates after -will follow  LOS: 4 days    [name] 03/25/2015, 10:24 AM Pager: [phone]

## 2015-03-25 NOTE — Progress Notes (Signed)
Subjective: Denies CP  No SOB  No dizziness  No abdominal pain. Objective: Filed Vitals:   03/24/15 2102 03/24/15 2107 03/25/15 0451 03/25/15 1000  BP: 149/40 149/40 155/32 140/44  Pulse:  63 64 66  Temp:  99.6 F (37.6 C) 98.4 F (36.9 C) 98.2 F (36.8 C)  TempSrc:  Oral Oral Oral  Resp:  Height:      Weight:   161 lb 13.1 oz (73.4 kg)   SpO2:  95% 95% 96%   Weight change: 10.6 oz (0.3 kg)  Intake/Output Summary (Last 24 hours) at 03/25/15 1530 Last data filed at 03/25/15 0900  Gross per 24 hour  Intake      0 ml  Output    800 ml  Net   -800 ml    General: Alert, awake, oriented x3, in no acute distress Neck:  JVP is normal Heart: Regular rate and rhythm, without murmurs, rubs, gallops.  Lungs: Clear to auscultation.  No rales or wheezes. Exemities:  No edema.   Neuro: Grossly intact, nonfocal.   Lab Results: Results for orders placed or performed during the hospital encounter of 03/21/15 (from the past 24 hour(s))  Glucose, capillary     Status: Abnormal   Collection Time: 03/24/15  4:39 PM  Result Value Ref Range   Glucose-Capillary 182 (H) 65 - 99 mg/dL  Glucose, capillary     Status: Abnormal   Collection Time: 03/24/15  9:05 PM  Result Value Ref Range   Glucose-Capillary 140 (H) 65 - 99 mg/dL  MRSA PCR Screening     Status: None   Collection Time: 03/25/15  5:00 AM  Result Value Ref Range   MRSA by PCR NEGATIVE NEGATIVE  Comprehensive metabolic panel     Status: Abnormal   Collection Time: 03/25/15  6:12 AM  Result Value Ref Range   Sodium 138 135 - 145 mmol/L   Potassium 3.4 (L) 3.5 - 5.1 mmol/L   Chloride 97 (L) 101 - 111 mmol/L   CO2 29 22 - 32 mmol/L   Glucose, Bld 129 (H) 65 - 99 mg/dL   BUN 540 (H) 6 - 20 mg/dL   Creatinine, Ser 9.81 (H) 0.61 - 1.24 mg/dL   Calcium 8.5 (L) 8.9 - 10.3 mg/dL   Total Protein 5.6 (L) 6.5 - 8.1 g/dL   Albumin 2.3 (L) 3.5 - 5.0 g/dL   AST 32 15 - 41 U/L   ALT 73 (H) 17 - 63 U/L   Alkaline  Phosphatase 201 (H) 38 - 126 U/L   Total Bilirubin 0.8 0.3 - 1.2 mg/dL   GFR calc non Af Amer 13 (L) >60 mL/min   GFR calc Af Amer 15 (L) >60 mL/min   Anion gap 12 5 - 15  CBC     Status: Abnormal   Collection Time: 03/25/15  6:12 AM  Result Value Ref Range   WBC 12.2 (H) 4.0 - 10.5 K/uL   RBC 3.43 (L) 4.22 - 5.81 MIL/uL   Hemoglobin 9.4 (L) 13.0 - 17.0 g/dL   HCT 19.1 (L) 47.8 - 29.5 %   MCV 85.4 78.0 - 100.0 fL   MCH 27.4 26.0 - 34.0 pg   MCHC 32.1 30.0 - 36.0 g/dL   RDW 62.1 (H) 30.8 - 65.7 %   Platelets 183 150 - 400 K/uL  Glucose, capillary     Status: Abnormal   Collection Time: 03/25/15  7:37 AM  Result Value Ref Range  Glucose-Capillary 150 (H) 65 - 99 mg/dL  Glucose, capillary     Status: Abnormal   Collection Time: 03/25/15 12:05 PM  Result Value Ref Range   Glucose-Capillary 126 (H) 65 - 99 mg/dL    Studies/Results: No results found.  Medications: Review   @PROBHOSP @  1.  Bradycardia  Patinet has been bradycardic this afternoon  Narrow complex  BP though has been normal to  high  Not hemodynamically destabilizing.  Not on any meds to slow him  I would check K.   I am not convinced of any active cardiac issues  I would follow   OK to proceed with IR procedure if BP ok     Reassess in am    2  Cardiac  Hx of CAD  No sx of active angina  2  Cholecystitis  Plan for drain placement    3.  PVOD  4.  HTN  BP is high  I would observe for now  No Rx    LOS: 4 days   Dietrich PatesPaula Oktober Glazer 03/25/2015, 3:30 PM

## 2015-03-25 NOTE — Progress Notes (Addendum)
Patient ID: [name], male   DOB: [birth year], 81 y.o.   MRN: [id number] [facility] PROGRESS NOTE  [name]:[id number] DOB: 1930/07/19 DOA: 03/21/2015 PCP: Lupita Raider, MD  Brief narrative:    81 y.o. male with past medical history of peripheral vascular disease status post left lower extremity stenting, chronic kidney disease stage 3-4, hypertension, hyperlipidemia, asthma who presented to [facility] ED with worsening intermittent epigastric pain for past few days prior to this admission. Pain is described as gnawing, worse with eating so this led to patient having poor po intake over past day or so. His pain is resolved at this point.   On admission, his BP was 197/45, afebrile and oxygen saturation 90-97% on room air. Blood work was notable for leukocytosis of 14.2, hemoglobin of 11.9, potassium of 3.5, creatinine of 3.7. Of note, per patient he just saw nephrologist who stopped lasix and zaroxolyn due to significant weight loss over past 6 months. CT abdomen and pelvis on this admission showed cholelithiasis and hiatal hernia with reflux. LFT's were WNL on this admission. Lipase was WNL.   His hospital course is slightly complicated wit mild troponin elevation for which reason we consulted cardiology.    Assessment/Plan:    HPI/Subjective: Feeling ok this morning, waiting for drain placement. He is hungry  Possible Cholecystitis:  Epigastric abdominal pain, Transaminases;  -  Lipase is WNL. - GI has saw the pt in consultation. recommended MRCP -MRCP; Gallstone. No evidence of choledocholithiasis or biliary obstruction.  -hold statin due to elevated LFT. Repeat labs in am./  -HIDA scan consistent with cholecystitis.  -For cholecystotomy tube placement today.   Essential hypertension - Continue Norvasc, clonidine, hydralazine - Order placed for hydralazine PRN if BP above 150/90  Peripheral arterial disease - Continue aspirin , plavix on hold for procedure   Renal  failure (ARF), acute on chronic, stage 4 - Creatinine 3.7 on this admission - Recent baseline in 10/2014 was 3.09.  -Continue to hold lasix and zaroxolyn -renal function stable. Appreciate Dr [employer] evaluation.   Leukocytosis; secondary to cholecystitis.  UA negative, chest x ary negative,.  HIDA scan positive for cholecystitis.  Trending down on Zosyn.   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  -replace orally after procedure today   Troponin elevation - Likely demand ischemia from CKD - Cardio consulted - No acute ischemic changes on 12 lead EKG - No complaints of chest pain - Continue aspirin holding plavix for procedure today  Dyslipidemia -hold statin due to elevated LFT>    DVT Prophylaxis  - On Lovenox subQ in hospital    Code Status: Full.  Family Communication:  plan of care discussed with the patient Disposition Plan: Awaiting cholecystostomy tube placement.   IV access:  Peripheral IV  Procedures and diagnostic studies:    Ct Abdomen Pelvis Wo Contrast 03/21/2015   1. Severe atherosclerotic calcifications involving the aorta and branch vessels. 2. Moderate-sized hiatal hernia with reflux of oral contrast into the distal esophagus. 3. Cholelithiasis. 4. No acute abdominal/pelvic findings, mass lesions or adenopathy.   Electronically Signed   By: Rudie Meyer M.D.   On: 03/21/2015 19:07   Dg Chest 2 View 03/21/2015  No active cardiopulmonary disease.   Electronically Signed   By: Amie Portland M.D.   On: 03/21/2015 16:51   Medical Consultants:  Gastroenterology Cardiology   Other Consultants:  None   IAnti-Infectives:   None    Alba Cory, MD  [facility] Pager  [phone]  Time spent in minutes: 25 minutes  If 7PM-7AM, please contact night-coverage [email or web address] Password [email or web address] 03/25/2015, 12:32 PM   LOS: 4 days     Objective: Filed Vitals:   03/24/15 2102 03/24/15 2107 03/25/15 0451 03/25/15 1000  BP: 149/40 149/40 155/32 140/44  Pulse:  63 64 66  Temp:  99.6 F (37.6 C) 98.4 F (36.9 C) 98.2 F (36.8 C)  TempSrc:  Oral Oral Oral  Resp:  Height:      Weight:   73.4 kg (161 lb 13.1 oz)   SpO2:  95% 95% 96%    Intake/Output Summary (Last 24 hours) at 03/25/15 1232 Last data filed at 03/25/15 0900  Gross per 24 hour  Intake    240 ml  Output   1100 ml  Net   -860 ml    Exam:   General:  Pt is alert, follows commands appropriately  Cardiovascular: Regular rate and rhythm, S1/S2 (+)  Respiratory: Clear to auscultation bilaterally, no wheezing, no crackles, no rhonchi  Abdomen: Soft, mild right UQ  tender, non distended, bowel sounds present  Extremities: No edema, pulses DP and PT palpable bilaterally  Data Reviewed: Basic Metabolic Panel:  Recent Labs Lab 03/21/15 1501 03/22/15 0422 03/23/15 0555 03/24/15 0531 03/25/15 0612  NA 137 138 136 139 138  K 3.5 3.4* 3.6 3.2* 3.4*  CL 91* 92* 95* 99* 97*  CO2 GLUCOSE 219* 182* 148* 187* 129*  BUN 117* 114* 117* 127* 115*  CREATININE 3.70* 3.61* 3.76* 3.87* 3.84*  CALCIUM 9.6 9.4 8.8* 8.7* 8.5*   Liver Function Tests:  Recent Labs Lab 03/21/15 1501 03/22/15 0422 03/24/15 0531 03/25/15 0612  AST 25 372* 43* 32  ALT 23 292* 104* 73*  ALKPHOS 85 318* 193* 201*  BILITOT 0.9 1.2 0.9 0.8  PROT 6.6 5.9* 5.3* 5.6*  ALBUMIN 3.7 3.2* 2.5* 2.3*    Recent Labs Lab 03/21/15 1501 03/22/15 0422  LIPASE 27 25   No results for input(s): AMMONIA in the last 168 hours. CBC:  Recent Labs Lab 03/21/15 1501 03/22/15 0422 03/23/15 0555 03/24/15 0531 03/25/15 0612  WBC 14.2* 16.1* 16.4* 14.3* 12.2*  NEUTROABS 12.6* 14.0*  --   --   --    HGB 11.9* 11.0* 10.1* 10.1* 9.4*  HCT 35.9* 33.8* 30.9* 31.3* 29.3*  MCV 85.5 84.9 85.8 86.2 85.4  PLT 219 221 184 180 183   Cardiac Enzymes:  Recent Labs Lab 03/21/15 2238 03/22/15 0422 03/22/15 1108  TROPONINI 0.05* 0.07* 0.09*   BNP: Invalid input(s): POCBNP CBG:  Recent Labs Lab 03/24/15 1132 03/24/15 1639 03/24/15 2105 03/25/15 0737 03/25/15 1205  GLUCAP 229* 182* 140* 150* 126*    Recent Results (from the past 240 hour(s))  MRSA PCR Screening     Status: None   Collection Time: 03/25/15  5:00 AM  Result Value Ref Range Status   MRSA by PCR NEGATIVE NEGATIVE Final    Comment:        The GeneXpert MRSA Assay (FDA approved for NASAL specimens only), is one component of a comprehensive MRSA colonization surveillance program. It is not intended to diagnose MRSA infection  nor to guide or monitor treatment for MRSA infections.      Scheduled Meds: . allopurinol  100 mg Oral Daily  . amLODipine  10 mg Oral q morning - 10a  . aspirin EC  81 mg Oral QPM  . calcitRIOL  0.25 mcg Oral q morning - 10a  . cholecalciferol  1,000 Units Oral BID  . cloNIDine  0.1 mg Oral TID  . enoxaparin (LOVENOX) injection  30 mg Subcutaneous Q24H  . feeding supplement (RESOURCE BREEZE)  1 Container Oral TID BM  . hydrALAZINE  50 mg Oral 3 times per day  . insulin aspart  0-9 Units Subcutaneous TID WC  . pantoprazole (PROTONIX) IV  40 mg Intravenous Q12H  . piperacillin-tazobactam (ZOSYN)  IV  2.25 g Intravenous 3 times per day  . terazosin  10 mg Oral QHS   Continuous Infusions:        Patient was notice to be bradycardic prior to initiation of cholecystectomy tube placement. HR in the 38 range. Asymptomatic. Procedure cancel for today. Will replete K with 2 runs IV. Will discontinue Clonidine. Cardiology consulted.

## 2015-03-25 NOTE — Progress Notes (Signed)
Chaplain follow-up  [name] is feeling good talking with grandson and his girl-friend.  [name] is very hungry due to not being able to eat before surgery.    [facility] waiting [facility] and shortly after update nurse came into room to remove patient for surgery.  Will follow-up if needed   03/25/15 1400  Clinical Encounter Type  Visited With Patient and family together;Health care provider  Visit Type Follow-up;Spiritual support  Referral From Nurse  Spiritual Encounters  Spiritual Needs Prayer

## 2015-03-25 NOTE — Progress Notes (Signed)
[facility] ROUNDING NOTE   Subjective:   Interval History: no complaints appears better  Objective:  Vital signs in last 24 hours:  Temp:  [98.2 F (36.8 C)-99.6 F (37.6 C)] 98.2 F (36.8 C) (05/27 1000) Pulse Rate:  [63-69] 66 (05/27 1000) Resp:  [16-18] 18 (05/27 1000) BP: (140-177)/(32-44) 140/44 mmHg (05/27 1000) SpO2:  [95 %-96 %] 96 % (05/27 1000) Weight:  [73.4 kg (161 lb 13.1 oz)] 73.4 kg (161 lb 13.1 oz) (05/27 0451)  Weight change: 0.3 kg (10.6 oz) Filed Weights   03/23/15 0426 03/24/15 0538 03/25/15 0451  Weight: 73.2 kg (161 lb 6 oz) 73.1 kg (161 lb 2.5 oz) 73.4 kg (161 lb 13.1 oz)    Intake/Output: I/O last 3 completed shifts: In: 480 [P.O.:480] Out: 1350 [Urine:1350]   Intake/Output this shift:  Total I/O In: 0  Out: 150 [Urine:150]  CVS- RRR RS- CTA ABD- BS present soft non-distended EXT- no edema   Basic Metabolic Panel:  Recent Labs Lab 03/21/15 1501 03/22/15 0422 03/23/15 0555 03/24/15 0531 03/25/15 0612  NA 137 138 136 139 138  K 3.5 3.4* 3.6 3.2* 3.4*  CL 91* 92* 95* 99* 97*  CO2 30 31 30 28 29   GLUCOSE 219* 182* 148* 187* 129*  BUN 117* 114* 117* 127* 115*  CREATININE 3.70* 3.61* 3.76* 3.87* 3.84*  CALCIUM 9.6 9.4 8.8* 8.7* 8.5*    Liver Function Tests:  Recent Labs Lab 03/21/15 1501 03/22/15 0422 03/24/15 0531 03/25/15 0612  AST 25 372* 43* 32  ALT 23 292* 104* 73*  ALKPHOS 85 318* 193* 201*  BILITOT 0.9 1.2 0.9 0.8  PROT 6.6 5.9* 5.3* 5.6*  ALBUMIN 3.7 3.2* 2.5* 2.3*    Recent Labs Lab 03/21/15 1501 03/22/15 0422  LIPASE 27 25   No results for input(s): AMMONIA in the last 168 hours.  CBC:  Recent Labs Lab 03/21/15 1501 03/22/15 0422 03/23/15 0555 03/24/15 0531 03/25/15 0612  WBC 14.2* 16.1* 16.4* 14.3* 12.2*  NEUTROABS 12.6* 14.0*  --   --   --   HGB 11.9* 11.0* 10.1* 10.1* 9.4*  HCT 35.9* 33.8* 30.9* 31.3* 29.3*  MCV 85.5 84.9 85.8 86.2 85.4  PLT 219 221 184 180 183    Cardiac  Enzymes:  Recent Labs Lab 03/21/15 2238 03/22/15 0422 03/22/15 1108  TROPONINI 0.05* 0.07* 0.09*    BNP: Invalid input(s): POCBNP  CBG:  Recent Labs Lab 03/24/15 0805 03/24/15 1132 03/24/15 1639 03/24/15 2105 03/25/15 0737  GLUCAP 230* 229* 182* 140* 150*    Microbiology: Results for orders placed or performed during the hospital encounter of 03/21/15  MRSA PCR Screening     Status: None   Collection Time: 03/25/15  5:00 AM  Result Value Ref Range Status   MRSA by PCR NEGATIVE NEGATIVE Final    Comment:        The GeneXpert MRSA Assay (FDA approved for NASAL specimens only), is one component of a comprehensive MRSA colonization surveillance program. It is not intended to diagnose MRSA infection nor to guide or monitor treatment for MRSA infections.     Coagulation Studies: No results for input(s): LABPROT, INR in the last 72 hours.  Urinalysis: No results for input(s): COLORURINE, LABSPEC, PHURINE, GLUCOSEU, HGBUR, BILIRUBINUR, KETONESUR, PROTEINUR, UROBILINOGEN, NITRITE, LEUKOCYTESUR in the last 72 hours.  Invalid input(s): APPERANCEUR    Imaging: Nm Hepatobiliary Including Gb  03/23/2015   CLINICAL DATA:  Epigastric abdominal pain over the past 5 days.  EXAM: NUCLEAR MEDICINE  HEPATOBILIARY IMAGING  TECHNIQUE: Sequential images of the abdomen were obtained out to 60 minutes following intravenous administration of radiopharmaceutical. At this point, 2.9 mg of intravenous morphine were administered and imaging was carried out an additional 30 min.  RADIOPHARMACEUTICALS:  5.2 mCi Tc-45m Choletec IV  COMPARISON:  No prior nuclear imaging. MRCP yesterday. CT abdomen and pelvis 03/21/2015.  FINDINGS: Hepatic uptake of Choletec is normal. Intra and extrahepatic bile ducts are not visible until approximately 20 min. Duodenal activity is visible at 25 min. At no point during the original 60 minutes of imaging did the gallbladder become visible. There is evidence of  tracer retention by the liver.  The gallbladder was not visualized after morphine administration. Reflux of activity into the stomach did occur.  IMPRESSION: 1. Cystic duct obstruction consistent with acute cholecystitis, despite the negative imaging studies thus far. The gallbladder was not visualized even after administration of intravenous morphine. 2. Cholestasis, with tracer retention by the liver. This is consistent with hepatocellular disease as there is no evidence of common bile duct obstruction. Please correlate with liver function tests. These results will be called to the ordering clinician or representative by the Radiologist Assistant, and communication documented in the PACS or zVision Dashboard.   Electronically Signed   By: Hulan Saas M.D.   On: 03/23/2015 14:50     Medications:     . allopurinol  100 mg Oral Daily  . amLODipine  10 mg Oral q morning - 10a  . aspirin EC  81 mg Oral QPM  . calcitRIOL  0.25 mcg Oral q morning - 10a  . cholecalciferol  1,000 Units Oral BID  . cloNIDine  0.1 mg Oral TID  . enoxaparin (LOVENOX) injection  30 mg Subcutaneous Q24H  . feeding supplement (RESOURCE BREEZE)  1 Container Oral TID BM  . hydrALAZINE  50 mg Oral 3 times per day  . insulin aspart  0-9 Units Subcutaneous TID WC  . pantoprazole (PROTONIX) IV  40 mg Intravenous Q12H  . piperacillin-tazobactam (ZOSYN)  IV  2.25 g Intravenous 3 times per day  . 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   HTN appears euvolemic And agree with holding diuretics  Anemia stable  Bones calcitriol Corrected calcium 9's  Lipids controlled  Diabetes  contolled  Plan for cholecystectomy    LOS: 4 [name] W  :12 AM

## 2015-03-25 NOTE — Care Management Note (Signed)
Case Management Note  Patient Details  Name: [name] MRN: [id number] Date of Birth: Feb 03, 1930  Subjective/Objective:                 CM following for progression and d/c planning.   Action/Plan: 03/25/2015 Met with pt re possible d/c needs, as HHRN may be needed for drain management post d/c. Pt has assistance at home no DME needs. Await d/c orders post procedure.  Expected Discharge Date:          03/28/2015        Expected Discharge Plan:  [place]  In-House Referral:  NA  Discharge planning Services  NA  Post Acute Care Choice:  NA Choice offered to:  NA  DME Arranged:    DME Agency:     HH Arranged:  RN [place] Agency:     Status of Service:  In process, will continue to follow  Medicare Important Message Given:  Yes Date Medicare IM Given:  03/25/15 Medicare IM give by:  Jasmine Pang RN MPH case manager Date Additional Medicare IM Given:    Additional Medicare Important Message give by:     If discussed at [place] of Stay Meetings, dates discussed:    Additional Comments:  Lemario, Chaikin, RN 03/25/2015, 10:27 AM

## 2015-03-25 NOTE — Progress Notes (Signed)
Patient ID: [name], male   DOB: 02/25/1930, 81 y.o.   MRN: [id number] Patient brought to IR for percutaneous cholecystostomy tube placement.  Patient's heart rate was consistently 40 bpm or less for at least 15 minutes.  Patient was asymptomatic.  Discussed with primary team and will postpone cholecystostomy tube placement until evaluated by cardiology.

## 2015-03-26 ENCOUNTER — Other Ambulatory Visit: Payer: Self-pay

## 2015-03-26 ENCOUNTER — Inpatient Hospital Stay (HOSPITAL_COMMUNITY): Payer: Medicare Other

## 2015-03-26 DIAGNOSIS — K810 Acute cholecystitis: Secondary | ICD-10-CM | POA: Insufficient documentation

## 2015-03-26 DIAGNOSIS — I10 Essential (primary) hypertension: Secondary | ICD-10-CM

## 2015-03-26 DIAGNOSIS — R001 Bradycardia, unspecified: Secondary | ICD-10-CM

## 2015-03-26 LAB — CBC
HEMATOCRIT: 29.2 % — AB (ref 39.0–52.0)
HEMOGLOBIN: 9.5 g/dL — AB (ref 13.0–17.0)
MCH: 27.8 pg (ref 26.0–34.0)
MCHC: 32.5 g/dL (ref 30.0–36.0)
MCV: 85.4 fL (ref 78.0–100.0)
PLATELETS: 182 10*3/uL (ref 150–400)
RBC: 3.42 MIL/uL — ABNORMAL LOW (ref 4.22–5.81)
RDW: 17 % — ABNORMAL HIGH (ref 11.5–15.5)
WBC: 8.7 10*3/uL (ref 4.0–10.5)

## 2015-03-26 LAB — BASIC METABOLIC PANEL
Anion gap: 12 (ref 5–15)
BUN: 113 mg/dL — AB (ref 6–20)
CO2: 28 mmol/L (ref 22–32)
CREATININE: 3.72 mg/dL — AB (ref 0.61–1.24)
Calcium: 8.5 mg/dL — ABNORMAL LOW (ref 8.9–10.3)
Chloride: 97 mmol/L — ABNORMAL LOW (ref 101–111)
GFR calc non Af Amer: 14 mL/min — ABNORMAL LOW (ref >60)
GFR, EST AFRICAN AMERICAN: 16 mL/min — AB (ref >60)
Glucose, Bld: 96 mg/dL (ref 65–99)
Potassium: 3.5 mmol/L (ref 3.5–5.1)
Sodium: 137 mmol/L (ref 135–145)

## 2015-03-26 LAB — GLUCOSE, CAPILLARY
GLUCOSE-CAPILLARY: 167 mg/dL — AB (ref 65–99)
Glucose-Capillary: 155 mg/dL — ABNORMAL HIGH (ref 65–99)
Glucose-Capillary: 150 mg/dL — ABNORMAL HIGH (ref 65–99)
Glucose-Capillary: 122 mg/dL — ABNORMAL HIGH (ref 65–99)

## 2015-03-26 LAB — PROTIME-INR
INR: 1.35 (ref 0.00–1.49)
PROTHROMBIN TIME: 16.8 s — AB (ref 11.6–15.2)

## 2015-03-26 MED ORDER — MORPHINE SULFATE 2 MG/ML IJ SOLN
1.0000 mg | INTRAMUSCULAR | Status: DC | PRN
  Administered 2015-03-26: 2 mg via INTRAVENOUS
  Filled 2015-03-26: qty 1

## 2015-03-26 MED ORDER — MIDAZOLAM HCL 2 MG/2ML IJ SOLN
INTRAMUSCULAR | Status: AC | PRN
  Administered 2015-03-26: 1 mg via INTRAVENOUS

## 2015-03-26 MED ORDER — LIDOCAINE HCL 1 % IJ SOLN
INTRAMUSCULAR | Status: AC
  Filled 2015-03-26: qty 20

## 2015-03-26 MED ORDER — FENTANYL CITRATE (PF) 100 MCG/2ML IJ SOLN
INTRAMUSCULAR | Status: AC | PRN
  Administered 2015-03-26: 50 ug via INTRAVENOUS

## 2015-03-26 MED ORDER — IOHEXOL 300 MG/ML  SOLN
50.0000 mL | Freq: Once | INTRAMUSCULAR | Status: AC | PRN
  Administered 2015-03-26: 10 mL

## 2015-03-26 MED ORDER — HYDROCODONE-ACETAMINOPHEN 5-325 MG PO TABS
1.0000 | ORAL_TABLET | Freq: Four times a day (QID) | ORAL | Status: DC | PRN
  Administered 2015-03-27: 1 via ORAL
  Filled 2015-03-26: qty 1

## 2015-03-26 MED ORDER — SODIUM CHLORIDE 0.9 % IV SOLN
INTRAVENOUS | Status: AC | PRN
  Administered 2015-03-26: 10 mL/h via INTRAVENOUS

## 2015-03-26 MED ORDER — MIDAZOLAM HCL 2 MG/2ML IJ SOLN
INTRAMUSCULAR | Status: AC
  Filled 2015-03-26: qty 2

## 2015-03-26 MED ORDER — FENTANYL CITRATE (PF) 100 MCG/2ML IJ SOLN
INTRAMUSCULAR | Status: AC
  Filled 2015-03-26: qty 2

## 2015-03-26 MED ORDER — HYDRALAZINE HCL 20 MG/ML IJ SOLN
10.0000 mg | INTRAMUSCULAR | Status: DC | PRN
  Administered 2015-03-27 – 2015-03-29 (×4): 10 mg via INTRAVENOUS
  Filled 2015-03-26 (×4): qty 1

## 2015-03-26 NOTE — Progress Notes (Signed)
  Subjective: Drain not placed yesterday secondary to bradycardia, down now for placement after bing seen by cardiology.  Objective: Vital signs in last 24 hours: Temp:  [97.3 F (36.3 C)-98.1 F (36.7 C)] 97.5 F (36.4 C) (05/28 0900) Pulse Rate:  [63-78] 78 (05/28 0900) Resp:  [16-18] 18 (05/28 0900) BP: (128-163)/(39-59) 161/59 mmHg (05/28 0900) SpO2:  [94 %-98 %] 95 % (05/28 0900) Weight:  [73.573 kg (162 lb 3.2 oz)] 73.573 kg (162 lb 3.2 oz) (05/27 2116) Last BM Date: 03/21/15 NPO 600 ml recorded yesterday No Bm Afebrile, VSS WBC is better, and creatinine is stable Intake/Output from previous day: 05/27 0701 - 05/28 0700 In: 600 [P.O.:600] Out: 1000 [Urine:1000] Intake/Output this shift: Total I/O In: 0  Out: 400 [Urine:400]  No examined down for IR drain placement  Lab Results:   Recent Labs  03/25/15 0612 03/26/15 0431  WBC 12.2* 8.7  HGB 9.4* 9.5*  HCT 29.3* 29.2*  PLT 183 182    BMET  Recent Labs  03/25/15 1714 03/26/15 0431  NA 140 137  K 3.2* 3.5  CL 100* 97*  CO2 28 28  GLUCOSE 100* 96  BUN 119* 113*  CREATININE 3.85* 3.72*  CALCIUM 8.6* 8.5*   PT/INR  Recent Labs  03/26/15 0431  LABPROT 16.8*  INR 1.35     Recent Labs Lab 03/21/15 1501 03/22/15 0422 03/24/15 0531 03/25/15 0612  AST 25 372* 43* 32  ALT 23 292* 104* 73*  ALKPHOS 85 318* 193* 201*  BILITOT 0.9 1.2 0.9 0.8  PROT 6.6 5.9* 5.3* 5.6*  ALBUMIN 3.7 3.2* 2.5* 2.3*     Lipase     Component Value Date/Time   LIPASE 25 03/22/2015 0422     Studies/Results: No results found.  Medications: . allopurinol  100 mg Oral Daily  . amLODipine  10 mg Oral q morning - 10a  . aspirin EC  81 mg Oral QPM  . calcitRIOL  0.25 mcg Oral q morning - 10a  . cholecalciferol  1,000 Units Oral BID  . [START ON 03/27/2015] enoxaparin (LOVENOX) injection  30 mg Subcutaneous Q24H  . feeding supplement (RESOURCE BREEZE)  1 Container Oral TID BM  . fentaNYL      . hydrALAZINE   50 mg Oral 3 times per day  . insulin aspart  0-9 Units Subcutaneous TID WC  . lidocaine      . midazolam      . pantoprazole (PROTONIX) IV  40 mg Intravenous Q12H  . piperacillin-tazobactam (ZOSYN)  IV  2.25 g Intravenous 3 times per day  . terazosin  10 mg Oral QHS    Assessment/Plan   Cholelithiasis possible cholecystitis  Likely a passed CBD stone  Percutaneous cholecystostomy drain placed by IR on today. Leukocytosis CKD with Nephrotic syndrome Hypertension AODM Bradycardia ABX:  Day 3 Zosyn   Plan:  Will see later today or tomorrow.   LOS: 5 days    [name] 03/26/2015

## 2015-03-26 NOTE — Progress Notes (Signed)
[facility] ROUNDING [facility]   Subjective:   Interval History: no issues today Surgery delayed due to bradycardia  Objective:  Vital signs in last 24 hours:  Temp:  [97.3 F (36.3 C)-98.2 F (36.8 C)] 97.3 F (36.3 C) (05/28 0449) Pulse Rate:  [63-67] 66 (05/28 0449) Resp:  [16-18] 18 (05/28 0449) BP: (128-163)/(39-52) 157/42 mmHg (05/28 0449) SpO2:  [94 %-98 %] 94 % (05/28 0449) Weight:  [73.573 kg (162 lb 3.2 oz)] 73.573 kg (162 lb 3.2 oz) (05/27 2116)  Weight change: 0.174 kg (6.1 oz) Filed Weights   03/24/15 0538 03/25/15 0451 03/25/15 2116  Weight: 73.1 kg (161 lb 2.5 oz) 73.4 kg (161 lb 13.1 oz) 73.573 kg (162 lb 3.2 oz)    Intake/Output: I/O last 3 completed shifts: In: 600 [P.O.:600] Out: 1650 [Urine:1650]   Intake/Output this shift:     CVS- RRR RS- CTA ABD- BS present soft non-distended EXT- no edema   Basic Metabolic Panel:  Recent Labs Lab 03/23/15 0555 03/24/15 0531 03/25/15 0612 03/25/15 1714 03/26/15 0431  NA 136 139 138 140 137  K 3.6 3.2* 3.4* 3.2* 3.5  CL 95* 99* 97* 100* 97*  CO2 30 28 29 28 28   GLUCOSE 148* 187* 129* 100* 96  BUN 117* 127* 115* 119* 113*  CREATININE 3.76* 3.87* 3.84* 3.85* 3.72*  CALCIUM 8.8* 8.7* 8.5* 8.6* 8.5*    Liver Function Tests:  Recent Labs Lab 03/21/15 1501 03/22/15 0422 03/24/15 0531 03/25/15 0612  AST 25 372* 43* 32  ALT 23 292* 104* 73*  ALKPHOS 85 318* 193* 201*  BILITOT 0.9 1.2 0.9 0.8  PROT 6.6 5.9* 5.3* 5.6*  ALBUMIN 3.7 3.2* 2.5* 2.3*    Recent Labs Lab 03/21/15 1501 03/22/15 0422  LIPASE 27 25   No results for input(s): AMMONIA in the last 168 hours.  CBC:  Recent Labs Lab 03/21/15 1501 03/22/15 0422 03/23/15 0555 03/24/15 0531 03/25/15 0612 03/26/15 0431  WBC 14.2* 16.1* 16.4* 14.3* 12.2* 8.7  NEUTROABS 12.6* 14.0*  --   --   --   --   HGB 11.9* 11.0* 10.1* 10.1* 9.4* 9.5*  HCT 35.9* 33.8* 30.9* 31.3* 29.3* 29.2*  MCV 85.5 84.9 85.8 86.2 85.4 85.4  PLT  219 221 184 180 183 182    Cardiac Enzymes:  Recent Labs Lab 03/21/15 2238 03/22/15 0422 03/22/15 1108  TROPONINI 0.05* 0.07* 0.09*    BNP: Invalid input(s): POCBNP  CBG:  Recent Labs Lab 03/25/15 0737 03/25/15 1205 03/25/15 1658 03/25/15 2119 03/26/15 0729  GLUCAP 150* 126* 83 316* 155*    Microbiology: Results for orders placed or performed during the hospital encounter of 03/21/15  MRSA PCR Screening     Status: None   Collection Time: 03/25/15  5:00 AM  Result Value Ref Range Status   MRSA by PCR NEGATIVE NEGATIVE Final    Comment:        The GeneXpert MRSA Assay (FDA approved for NASAL specimens only), is one component of a comprehensive MRSA colonization surveillance program. It is not intended to diagnose MRSA infection nor to guide or monitor treatment for MRSA infections.     Coagulation Studies:  Recent Labs  03/26/15 0431  LABPROT 16.8*  INR 1.35    Urinalysis: No results for input(s): COLORURINE, LABSPEC, PHURINE, GLUCOSEU, HGBUR, BILIRUBINUR, KETONESUR, PROTEINUR, UROBILINOGEN, NITRITE, LEUKOCYTESUR in the last 72 hours.  Invalid input(s): APPERANCEUR    Imaging: No results found.   Medications:     . allopurinol  100 mg Oral Daily  . amLODipine  10 mg Oral q morning - 10a  . aspirin EC  81 mg Oral QPM  . calcitRIOL  0.25 mcg Oral q morning - 10a  . cholecalciferol  1,000 Units Oral BID  . [START ON 03/27/2015] enoxaparin (LOVENOX) injection  30 mg Subcutaneous Q24H  . feeding supplement (RESOURCE BREEZE)  1 Container Oral TID BM  . hydrALAZINE  50 mg Oral 3 times per day  . insulin aspart  0-9 Units Subcutaneous TID WC  . pantoprazole (PROTONIX) IV  40 mg Intravenous Q12H  . piperacillin-tazobactam (ZOSYN)  IV  2.25 g Intravenous 3 times per day  . 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   HTN appears euvolemic And agree with holding diuretics  Anemia stable  Bones calcitriol Corrected calcium 9's  Lipids controlled  Diabetes contolled  Bradycardia asymptomatic   Plan for cholecystectomy     LOS: 5 [place] W  :10 AM

## 2015-03-26 NOTE — Sedation Documentation (Signed)
Patient denies pain and is resting comfortably.  

## 2015-03-26 NOTE — Progress Notes (Signed)
Pt c/o pain 7/10 after returning from drain placement. Gave 1 mg morphine. Pt still c/o of 10/10 pain. MD notified and orders received. Will continue to monitor.   Carrie MewJasmine Lateefah Mallery, RN

## 2015-03-26 NOTE — Progress Notes (Signed)
Primary cardiologist: Dr. Nanetta BattyJonathan Duran  Seen for followup: Bradycardia  Subjective:    Up in chair. No chest pain or dyspnea. No palpitations.  Objective:   Temp:  [97.3 F (36.3 C)-98.1 F (36.7 C)] 97.3 F (36.3 C) (05/28 0449) Pulse Rate:  [63-67] 66 (05/28 0449) Resp:  [16-18] 18 (05/28 0449) BP: (128-163)/(39-52) 157/42 mmHg (05/28 0449) SpO2:  [94 %-98 %] 94 % (05/28 0449) Weight:  [162 lb 3.2 oz (73.573 kg)] 162 lb 3.2 oz (73.573 kg) (05/27 2116) Last BM Date: 03/21/15  Filed Weights   03/24/15 0538 03/25/15 0451 03/25/15 2116  Weight: 161 lb 2.5 oz (73.1 kg) 161 lb 13.1 oz (73.4 kg) 162 lb 3.2 oz (73.573 kg)    Intake/Output Summary (Last 24 hours) at 03/26/15 1009 Last data filed at 03/26/15 0600  Gross per 24 hour  Intake    600 ml  Output    850 ml  Net   -250 ml    Telemetry: Currently sinus rhythm in the 60s. Dips to high 30s transiently have been seen. No pauses.  Exam:  General: No distress.  Lungs: Clear, nonlabored.  Cardiac: RRR with 2/6 systolic murmur.  Extremities: No pitting edema.  Lab Results:  Basic Metabolic Panel:  Recent Labs Lab 03/25/15 0612 03/25/15 1714 03/26/15 0431  NA 138 140 137  K 3.4* 3.2* 3.5  CL 97* 100* 97*  CO2 29 28 28   GLUCOSE 129* 100* 96  BUN 115* 119* 113*  CREATININE 3.84* 3.85* 3.72*  CALCIUM 8.5* 8.6* 8.5*    Liver Function Tests:  Recent Labs Lab 03/22/15 0422 03/24/15 0531 03/25/15 0612  AST 372* 43* 32  ALT 292* 104* 73*  ALKPHOS 318* 193* 201*  BILITOT 1.2 0.9 0.8  PROT 5.9* 5.3* 5.6*  ALBUMIN 3.2* 2.5* 2.3*    CBC:  Recent Labs Lab 03/24/15 0531 03/25/15 0612 03/26/15 0431  WBC 14.3* 12.2* 8.7  HGB 10.1* 9.4* 9.5*  HCT 31.3* 29.3* 29.2*  MCV 86.2 85.4 85.4  PLT 180 183 182    Cardiac Enzymes:  Recent Labs Lab 03/21/15 2238 03/22/15 0422 03/22/15 1108  TROPONINI 0.05* 0.07* 0.09*    Coagulation:  Recent Labs Lab 03/26/15 0431  INR 1.35     Echocardiogram 03/23/2015: Study Conclusions  - Left ventricle: LV function is hyperdynamic. The cavity size was normal. Wall thickness was increased in a pattern of moderate LVH. The estimated ejection fraction was 75%. Wall motion was normal; there were no regional wall motion abnormalities. - Left atrium: The atrium was mildly dilated. - Right ventricle: The cavity size was normal. Systolic function was normal.   Medications:   Scheduled Medications: . allopurinol  100 mg Oral Daily  . amLODipine  10 mg Oral q morning - 10a  . aspirin EC  81 mg Oral QPM  . calcitRIOL  0.25 mcg Oral q morning - 10a  . cholecalciferol  1,000 Units Oral BID  . [START ON 03/27/2015] enoxaparin (LOVENOX) injection  30 mg Subcutaneous Q24H  . feeding supplement (RESOURCE BREEZE)  1 Container Oral TID BM  . hydrALAZINE  50 mg Oral 3 times per day  . insulin aspart  0-9 Units Subcutaneous TID WC  . pantoprazole (PROTONIX) IV  40 mg Intravenous Q12H  . piperacillin-tazobactam (ZOSYN)  IV  2.25 g Intravenous 3 times per day  . terazosin  10 mg Oral QHS      PRN Medications:  acetaminophen **OR** acetaminophen, hydrALAZINE, morphine injection, ondansetron **OR** ondansetron (  ZOFRAN) IV   Assessment:   1. Bradycardia, intermittent and not clearly symptomatic. Currently not an any heart rate control medications.  2. Essential hypertension. On Norvasc and Hydralazine.  3. History of CAD, no active angina.  4. 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  5. Cholecystitis, pending percutaneous drainage per IR.   Plan/Discussion:    Continue telemetry, followup ECG. No clear indication for pacemaker or other specific cardiac intervention at this time.   Jonelle Sidle, M.D., F.A.C.C.

## 2015-03-26 NOTE — Procedures (Signed)
78F GB drain placed under US and fluoro No complication No blood loss. [facility].

## 2015-03-26 NOTE — Progress Notes (Signed)
Patient ID: Pasty ArchRoy C Huesca, male   DOB: 11/18/1929, 81 y.o.   MRN: [id number] [facility] PROGRESS NOTE  Pasty ArchRoy C Walle WUJ:[id number] DOB: 09/26/1930 DOA: 03/21/2015 PCP: [name],[name], MD  Brief narrative:    81 y.o. male with past medical history of peripheral vascular disease status post left lower extremity stenting, chronic kidney disease stage 3-4, hypertension, hyperlipidemia, asthma who presented to [facility] ED with worsening intermittent epigastric pain for past few days prior to this admission. Pain is described as gnawing, worse with eating so this led to patient having poor po intake over past day or so. His pain is resolved at this point.   On admission, his BP was 197/45, afebrile and oxygen saturation 90-97% on room air. Blood work was notable for leukocytosis of 14.2, hemoglobin of 11.9, potassium of 3.5, creatinine of 3.7. Of note, per patient he just saw nephrologist who stopped lasix and zaroxolyn due to significant weight loss over past 6 months. CT abdomen and pelvis on this admission showed cholelithiasis and hiatal hernia with reflux. LFT's were WNL on this admission. Lipase was WNL.     Assessment/Plan:    HPI/Subjective: Has mild nausea, wants to eat.   Possible Cholecystitis:  Epigastric abdominal pain, Transaminases;  -  Lipase is WNL. - GI has saw the pt in consultation. recommended MRCP -MRCP; Gallstone. No evidence of choledocholithiasis or biliary obstruction.  -hold statin due to elevated LFT. Repeat labs in am./  -HIDA scan consistent with cholecystitis.  -For cholecystotomy tube placement today. HR stable.   Essential hypertension - Continue Norvasc, , hydralazine - Order placed for hydralazine PRN if BP above 150/90 -clonidine on hold due to bradycardia.   Bradycardia; asymptomatic.  Appreciate cardiology/   Peripheral arterial disease - Continue aspirin , plavix on hold for procedure   Renal failure (ARF), acute on chronic, stage 4 - Creatinine 3.7  on this admission - Recent baseline in 10/2014 was 3.09.  -Continue to hold lasix and zaroxolyn -renal function stable. Appreciate Dr [employer] evaluation.   Leukocytosis; secondary to cholecystitis.  UA negative, chest x ary negative,.  HIDA scan positive for cholecystitis.  Trending down on Zosyn.   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   Troponin elevation - Likely demand ischemia from CKD - Cardio consulted - No acute ischemic changes on 12 lead EKG - No complaints of chest pain - Continue aspirin holding plavix for procedure today  Dyslipidemia -hold statin due to elevated LFT>    DVT Prophylaxis  - On Lovenox subQ in hospital    Code Status: Full.  Family Communication:  plan of care discussed with the patient Disposition Plan: Awaiting cholecystostomy tube placement.   IV access:  Peripheral IV  Procedures and diagnostic studies:    Ct Abdomen Pelvis Wo Contrast 03/21/2015   1. Severe atherosclerotic calcifications involving the aorta and branch vessels. 2. Moderate-sized hiatal hernia with reflux of oral contrast into the distal esophagus. 3. Cholelithiasis. 4. No acute abdominal/pelvic findings, mass lesions or adenopathy.   Electronically Signed   By: Rudie MeyerP.  Gallerani M.D.   On: 03/21/2015 19:07   Dg Chest 2 View 03/21/2015    No active cardiopulmonary disease.   Electronically Signed   By: Amie Portlandavid  Ormond M.D.   On: 03/21/2015 16:51  Medical Consultants:  Gastroenterology Cardiology   Other Consultants:  None   IAnti-Infectives:   None    Alba Cory, MD  [facility] Pager [phone]  Time spent in minutes: 25 minutes  If 7PM-7AM, please contact night-coverage [email or web address] Password [email or web address] 03/26/2015, 12:47 PM   LOS: 5 days      Objective: Filed Vitals:   03/26/15 1203 03/26/15 1210 03/26/15 1215 03/26/15 1218  BP: 148/41 198/65 197/48 197/48  Pulse: 79 78 71 78  Temp:      TempSrc:      Resp: Height:      Weight:      SpO2: 99% 99% 99% 97%    Intake/Output Summary (Last 24 hours) at 03/26/15 1247 Last data filed at 03/26/15 0954  Gross per 24 hour  Intake    600 ml  Output   1250 ml  Net   -650 ml    Exam:   General:  Pt is alert, follows commands appropriately  Cardiovascular: Regular rate and rhythm, S1/S2 (+)  Respiratory: Clear to auscultation bilaterally, no wheezing, no crackles, no rhonchi  Abdomen: Soft, mild right UQ  tender, non distended, bowel sounds present  Extremities: No edema, pulses DP and PT palpable bilaterally  Data Reviewed: Basic Metabolic Panel:  Recent Labs Lab 03/23/15 0555 03/24/15 0531 03/25/15 0612 03/25/15 1714 03/26/15 0431  NA 136 139 138 140 137  K 3.6 3.2* 3.4* 3.2* 3.5  CL 95* 99* 97* 100* 97*  CO2 GLUCOSE 148* 187* 129* 100* 96  BUN 117* 127* 115* 119* 113*  CREATININE 3.76* 3.87* 3.84* 3.85* 3.72*  CALCIUM 8.8* 8.7* 8.5* 8.6* 8.5*   Liver Function Tests:  Recent Labs Lab 03/21/15 1501 03/22/15 0422 03/24/15 0531 03/25/15 0612  AST 25 372* 43* 32  ALT 23 292* 104* 73*  ALKPHOS 85 318* 193* 201*  BILITOT 0.9 1.2 0.9 0.8  PROT 6.6 5.9* 5.3* 5.6*  ALBUMIN 3.7 3.2* 2.5* 2.3*    Recent Labs Lab 03/21/15 1501 03/22/15 0422  LIPASE 27 25   No results for input(s): AMMONIA in the last 168 hours. CBC:  Recent Labs Lab 03/21/15 1501 03/22/15 0422 03/23/15 0555 03/24/15 0531 03/25/15 0612 03/26/15 0431  WBC 14.2* 16.1* 16.4* 14.3* 12.2* 8.7  NEUTROABS 12.6* 14.0*  --   --   --   --   HGB 11.9* 11.0* 10.1* 10.1* 9.4* 9.5*  HCT 35.9* 33.8* 30.9* 31.3* 29.3* 29.2*  MCV 85.5 84.9 85.8 86.2 85.4 85.4  PLT 219 221 184 180 183 182   Cardiac Enzymes:  Recent Labs Lab 03/21/15 2238  03/22/15 0422 03/22/15 1108  TROPONINI 0.05* 0.07* 0.09*   BNP: Invalid input(s): POCBNP CBG:  Recent Labs Lab 03/25/15 1205 03/25/15 1658 03/25/15 2119 03/26/15 0729 03/26/15 1142  GLUCAP 126* 83 316* 155* 150*    Recent Results (from the past 240 hour(s))  MRSA PCR Screening     Status: None   Collection Time: 03/25/15  5:00 AM  Result Value Ref Range Status   MRSA by PCR NEGATIVE NEGATIVE Final    Comment:        The GeneXpert MRSA Assay (FDA approved for NASAL specimens only), is one component of a comprehensive MRSA colonization surveillance program. It is not intended to diagnose MRSA infection nor to guide or monitor treatment for MRSA infections.      Scheduled Meds: . allopurinol  100 mg Oral Daily  . amLODipine  10 mg Oral q morning - 10a  . aspirin EC  81 mg Oral QPM  . calcitRIOL  0.25 mcg Oral q morning - 10a  . cholecalciferol  1,000 Units Oral BID  . [START ON 03/27/2015] enoxaparin (LOVENOX) injection  30 mg Subcutaneous Q24H  . feeding supplement (RESOURCE BREEZE)  1 Container Oral TID BM  . fentaNYL      . hydrALAZINE  50 mg Oral 3 times per day  . insulin aspart  0-9 Units Subcutaneous TID WC  . lidocaine      . midazolam      . pantoprazole (PROTONIX) IV  40 mg Intravenous Q12H  . piperacillin-tazobactam (ZOSYN)  IV  2.25 g Intravenous 3 times per day  . terazosin  10 mg Oral QHS   Continuous Infusions:

## 2015-03-27 ENCOUNTER — Inpatient Hospital Stay (HOSPITAL_COMMUNITY): Payer: Medicare Other

## 2015-03-27 DIAGNOSIS — R7989 Other specified abnormal findings of blood chemistry: Secondary | ICD-10-CM | POA: Insufficient documentation

## 2015-03-27 DIAGNOSIS — R945 Abnormal results of liver function studies: Secondary | ICD-10-CM | POA: Insufficient documentation

## 2015-03-27 LAB — GLUCOSE, CAPILLARY
GLUCOSE-CAPILLARY: 251 mg/dL — AB (ref 65–99)
GLUCOSE-CAPILLARY: 206 mg/dL — AB (ref 65–99)
GLUCOSE-CAPILLARY: 163 mg/dL — AB (ref 65–99)
Glucose-Capillary: 207 mg/dL — ABNORMAL HIGH (ref 65–99)

## 2015-03-27 LAB — BASIC METABOLIC PANEL
Anion gap: 14 (ref 5–15)
BUN: 115 mg/dL — ABNORMAL HIGH (ref 6–20)
CALCIUM: 8.4 mg/dL — AB (ref 8.9–10.3)
CHLORIDE: 97 mmol/L — AB (ref 101–111)
CO2: 25 mmol/L (ref 22–32)
Creatinine, Ser: 4.37 mg/dL — ABNORMAL HIGH (ref 0.61–1.24)
GFR calc Af Amer: 13 mL/min — ABNORMAL LOW (ref >60)
GFR, EST NON AFRICAN AMERICAN: 11 mL/min — AB (ref >60)
GLUCOSE: 236 mg/dL — AB (ref 65–99)
Potassium: 3.4 mmol/L — ABNORMAL LOW (ref 3.5–5.1)
Sodium: 136 mmol/L (ref 135–145)

## 2015-03-27 LAB — CBC
HCT: 28.6 % — ABNORMAL LOW (ref 39.0–52.0)
HEMOGLOBIN: 9.3 g/dL — AB (ref 13.0–17.0)
MCH: 27.8 pg (ref 26.0–34.0)
MCHC: 32.5 g/dL (ref 30.0–36.0)
MCV: 85.4 fL (ref 78.0–100.0)
PLATELETS: 170 10*3/uL (ref 150–400)
RBC: 3.35 MIL/uL — AB (ref 4.22–5.81)
RDW: 17.2 % — AB (ref 11.5–15.5)
WBC: 14.9 10*3/uL — ABNORMAL HIGH (ref 4.0–10.5)

## 2015-03-27 NOTE — Progress Notes (Signed)
[facility] ROUNDING NOTE   Subjective:   Interval History:  No complaints s/p cholecystostomy  Objective:  Vital signs in last 24 hours:  Temp:  [97.4 F (36.3 C)-98.4 F (36.9 C)] 97.4 F (36.3 C) (05/29 0900) Pulse Rate:  [55-87] 80 (05/29 0900) Resp:  [14-20] 20 (05/29 0900) BP: (154-195)/(32-49) 164/49 mmHg (05/29 0900) SpO2:  [92 %-98 %] 95 % (05/29 0900) Weight:  [73.211 kg (161 lb 6.4 oz)] 73.211 kg (161 lb 6.4 oz) (05/28 2034)  Weight change: -0.363 kg (-12.8 oz) Filed Weights   03/25/15 0451 03/25/15 2116 03/26/15 2034  Weight: 73.4 kg (161 lb 13.1 oz) 73.573 kg (162 lb 3.2 oz) 73.211 kg (161 lb 6.4 oz)    Intake/Output: I/O last 3 completed shifts: In: 480 [P.O.:480] Out: 1675 [Urine:1575; Drains:100]   Intake/Output this shift:  Total I/O In: -  Out: 265 [Urine:150; Drains:115]  CVS- RRR RS- CTA ABD- BS present soft non-distended EXT- no edema   Basic Metabolic Panel:  Recent Labs Lab 03/24/15 0531 03/25/15 0612 03/25/15 1714 03/26/15 0431 03/27/15 0740  NA 139 138 140 137 136  K 3.2* 3.4* 3.2* 3.5 3.4*  CL 99* 97* 100* 97* 97*  CO2 28 29 28 28 25   GLUCOSE 187* 129* 100* 96 236*  BUN 127* 115* 119* 113* 115*  CREATININE 3.87* 3.84* 3.85* 3.72* 4.37*  CALCIUM 8.7* 8.5* 8.6* 8.5* 8.4*    Liver Function Tests:  Recent Labs Lab 03/21/15 1501 03/22/15 0422 03/24/15 0531 03/25/15 0612  AST 25 372* 43* 32  ALT 23 292* 104* 73*  ALKPHOS 85 318* 193* 201*  BILITOT 0.9 1.2 0.9 0.8  PROT 6.6 5.9* 5.3* 5.6*  ALBUMIN 3.7 3.2* 2.5* 2.3*    Recent Labs Lab 03/21/15 1501 03/22/15 0422  LIPASE 27 25   No results for input(s): AMMONIA in the last 168 hours.  CBC:  Recent Labs Lab 03/21/15 1501 03/22/15 0422 03/23/15 0555 03/24/15 0531 03/25/15 0612 03/26/15 0431 03/27/15 0740  WBC 14.2* 16.1* 16.4* 14.3* 12.2* 8.7 14.9*  NEUTROABS 12.6* 14.0*  --   --   --   --   --   HGB 11.9* 11.0* 10.1* 10.1* 9.4* 9.5* 9.3*   HCT 35.9* 33.8* 30.9* 31.3* 29.3* 29.2* 28.6*  MCV 85.5 84.9 85.8 86.2 85.4 85.4 85.4  PLT 219 221 184 180 183 182 170    Cardiac Enzymes:  Recent Labs Lab 03/21/15 2238 03/22/15 0422 03/22/15 1108  TROPONINI 0.05* 0.07* 0.09*    BNP: Invalid input(s): POCBNP  CBG:  Recent Labs Lab 03/26/15 1142 03/26/15 1644 03/26/15 2039 03/27/15 0721 03/27/15 1149  GLUCAP 150* 167* 122* 251* 206*    Microbiology: Results for orders placed or performed during the hospital encounter of 03/21/15  MRSA PCR Screening     Status: None   Collection Time: 03/25/15  5:00 AM  Result Value Ref Range Status   MRSA by PCR NEGATIVE NEGATIVE Final    Comment:        The GeneXpert MRSA Assay (FDA approved for NASAL specimens only), is one component of a comprehensive MRSA colonization surveillance program. It is not intended to diagnose MRSA infection nor to guide or monitor treatment for MRSA infections.   Culture, routine-abscess     Status: None (Preliminary result)   Collection Time: 03/26/15 12:19 PM  Result Value Ref Range Status   Specimen Description ABSCESS  Final   Special Requests GALLBALL  Final   Gram Stain PENDING  Incomplete  Culture   Final    Culture reincubated for better growth Performed at [facility]    Report Status PENDING  Incomplete    Coagulation Studies:  Recent Labs  03/26/15 0431  LABPROT 16.8*  INR 1.35    Urinalysis: No results for input(s): COLORURINE, LABSPEC, PHURINE, GLUCOSEU, HGBUR, BILIRUBINUR, KETONESUR, PROTEINUR, UROBILINOGEN, NITRITE, LEUKOCYTESUR in the last 72 hours.  Invalid input(s): APPERANCEUR    Imaging: Ir Perc Cholecystostomy  03/26/2015   CLINICAL DATA:  Right upper abdominal pain. Cholelithiasis and pericholecystic inflammatory changes on MR. Abnormal hepatobiliary scintigraphy indicating cystic duct obstruction.  EXAM: PERCUTANEOUS CHOLECYSTOSTOMY TUBE PLACEMENT WITH ULTRASOUND AND FLUOROSCOPIC GUIDANCE:   FLUOROSCOPY TIME:  30 seconds, 19 mGy  TECHNIQUE: The procedure, risks (including but not limited to bleeding, infection, organ damage ), benefits, and alternatives were explained to the patient. Questions regarding the procedure were encouraged and answered. The patient understands and consents to the procedure. Survey ultrasound of the abdomen was performed and an appropriate skin entry site was identified. Skin site was marked, prepped with Betadine, and draped in usual sterile fashion, and infiltrated locally with 1% lidocaine.  Intravenous Fentanyl and Versed were administered as conscious sedation during continuous cardiorespiratory monitoring by the radiology RN, with a total moderate sedation time of 6 minutes.  Under real-time ultrasound guidance, gallbladder was accessed using a transhepatic approach with a 21-gauge needle. Ultrasound image documentation was saved. Bile returned through the hub. Needle was exchanged over a 018 guidewire for transitional dilator which allowed placement of 035 J wire. Over this, a 10.2 French pigtail catheter was advanced and formed centrally in the gallbladder lumen. Small contrast injection confirmed appropriate position. Catheter secured externally with 0 Prolene suture and placed external drain bag. Patient tolerated the procedure well.  COMPLICATIONS: COMPLICATIONS none  IMPRESSION: 1. Technically successful percutaneous cholecystostomy tube placement with ultrasound and fluoroscopic guidance.   Electronically Signed   By: Corlis Leak M.D.   On: 03/26/2015 13:54     Medications:     . allopurinol  100 mg Oral Daily  . amLODipine  10 mg Oral q morning - 10a  . aspirin EC  81 mg Oral QPM  . calcitRIOL  0.25 mcg Oral q morning - 10a  . cholecalciferol  1,000 Units Oral BID  . enoxaparin (LOVENOX) injection  30 mg Subcutaneous Q24H  . feeding supplement (RESOURCE BREEZE)  1 Container Oral TID BM  . hydrALAZINE  50 mg Oral 3 times per day  . insulin aspart  0-9  Units Subcutaneous TID WC  . pantoprazole (PROTONIX) IV  40 mg Intravenous Q12H  . piperacillin-tazobactam (ZOSYN)  IV  2.25 g Intravenous 3 times per day  . 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   HTN appears euvolemic And agree with holding diuretics  Anemia stable  Bones calcitriol Corrected calcium 9's  Lipids controlled  Diabetes contolled  Bradycardia asymptomatic  Appears to be doing well without diuretics since admission !  I would think he could reasonable hold his diuretics unless he notices some edema and can be followed by [facility] as outpatient . I will sign off today    LOS: 6 Shaquanta Harkless W  :01 PM

## 2015-03-27 NOTE — Progress Notes (Signed)
Patient ID: [name], male   DOB: 02/14/1930, 81 y.o.   MRN: [id number] [facility] PROGRESS NOTE  [name] WUJ:[id number] DOB: 06/29/1930 DOA: 03/21/2015 PCP: [name],[name], MD  Brief narrative:    81 y.o. male with past medical history of peripheral vascular disease status post left lower extremity stenting, chronic kidney disease stage 3-4, hypertension, hyperlipidemia, asthma who [facility] with worsening intermittent epigastric pain for past few days prior to this admission. Pain is described as gnawing, worse with eating so this led to patient having poor po intake over past day or so. His pain is resolved at this point.   On admission, his BP was 197/45, afebrile and oxygen saturation 90-97% on room air. Blood work was notable for leukocytosis of 14.2, hemoglobin of 11.9, potassium of 3.5, creatinine of 3.7. Of note, per patient he just saw nephrologist who stopped lasix and zaroxolyn due to significant weight loss over past 6 months. CT abdomen and pelvis on this admission showed cholelithiasis and hiatal hernia with reflux. LFT's were WNL on this admission. Lipase was WNL.     Assessment/Plan:    HPI/Subjective: Pain cholecystotomy tube palce better. No BM since admission,. Passing sporadic gas.  Nausea better. Abdomen distended.   Possible Cholecystitis:  Epigastric abdominal pain, Transaminases;  -  Lipase is WNL. - GI has saw the pt in consultation. recommended MRCP -MRCP; Gallstone. No evidence of choledocholithiasis or biliary obstruction.  -hold statin due to elevated LFT. Repeat labs in am./  -HIDA scan consistent with cholecystitis.  -S/P cholecystostomy tube placement 5-28.  -check KUB rule out ileus./  -Resume plavix when ok by surgery   Essential hypertension - Continue Norvasc, , hydralazine - Order placed for hydralazine PRN if BP above 150/90 -clonidine on hold due to bradycardia.   Bradycardia; asymptomatic.  Appreciate cardiology/    Peripheral arterial disease - Continue aspirin , plavix on hold for procedure   Renal failure (ARF), acute on chronic, stage 4 - Creatinine 3.7 on this admission - Recent baseline in 10/2014 was 3.09.  -Continue to hold lasix and zaroxolyn -renal function stable. Appreciate Dr [employer] evaluation.  -cr increase to 4.3. Renal following.   Leukocytosis; secondary to cholecystitis.  UA negative, chest x ary negative,.  HIDA scan positive for cholecystitis.  Continue to monitor. Patient on zosyn.   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   Troponin elevation - Likely demand ischemia from CKD - Cardio consulted - No acute ischemic changes on 12 lead EKG - No complaints of chest pain -Resume plavix when ok by surgery  Dyslipidemia -hold statin due to elevated LFT>    DVT Prophylaxis  - On Lovenox subQ in hospital    Code Status: Full.  Family Communication:  plan of care discussed with the patient Disposition Plan: Awaiting improvement of renal function, diet to be advance.   IV access:  Peripheral IV  Procedures and diagnostic studies:    Ct Abdomen Pelvis Wo Contrast 03/21/2015   1. Severe atherosclerotic calcifications involving the aorta and branch vessels. 2. Moderate-sized hiatal hernia with reflux of oral contrast into the distal esophagus. 3. Cholelithiasis. 4. No acute abdominal/pelvic findings, mass lesions or adenopathy.   Electronically Signed   By: Orlene PlumP.  Gallerani M.D.  On: 03/21/2015 19:07   Dg Chest 2 View 03/21/2015    No active cardiopulmonary disease.   Electronically Signed   By: Amie Portland M.D.   On: 03/21/2015 16:51   Medical Consultants:  Gastroenterology Cardiology   Other Consultants:  None   IAnti-Infectives:   None     Alba Cory, MD  [facility] Pager [phone]  Time spent in minutes: 25 minutes  If 7PM-7AM, please contact night-coverage [email or web address] Password [email or web address] 03/27/2015, 11:27 AM   LOS: 6 days     Objective: Filed Vitals:   03/26/15 2034 03/27/15 0446 03/27/15 0638 03/27/15 0900  BP: 195/32 189/39 190/36 164/49  Pulse: 87 78 72 80  Temp: 98.4 F (36.9 C) 98 F (36.7 C)  97.4 F (36.3 C)  TempSrc: Oral Oral  Oral  Resp: Height:      Weight: 73.211 kg (161 lb 6.4 oz)     SpO2: 92% 93%  95%    Intake/Output Summary (Last 24 hours) at 03/27/15 1127 Last data filed at 03/27/15 0450  Gross per 24 hour  Intake      0 ml  Output    675 ml  Net   -675 ml    Exam:   General:  Pt is alert, follows commands appropriately  Cardiovascular: Regular rate and rhythm, S1/S2 (+)  Respiratory: Clear to auscultation bilaterally, no wheezing, no crackles, no rhonchi  Abdomen: Soft, mild right UQ  tender, non distended, bowel sounds present  Extremities: No edema, pulses DP and PT palpable bilaterally  Data Reviewed: Basic Metabolic Panel:  Recent Labs Lab 03/24/15 0531 03/25/15 0612 03/25/15 1714 03/26/15 0431 03/27/15 0740  NA 139 138 140 137 136  K 3.2* 3.4* 3.2* 3.5 3.4*  CL 99* 97* 100* 97* 97*  CO2 GLUCOSE 187* 129* 100* 96 236*  BUN 127* 115* 119* 113* 115*  CREATININE 3.87* 3.84* 3.85* 3.72* 4.37*  CALCIUM 8.7* 8.5* 8.6* 8.5* 8.4*   Liver Function Tests:  Recent Labs Lab 03/21/15 1501 03/22/15 0422 03/24/15 0531 03/25/15 0612  AST 25 372* 43* 32  ALT 23 292* 104* 73*  ALKPHOS 85 318* 193* 201*  BILITOT 0.9 1.2 0.9 0.8  PROT 6.6 5.9* 5.3* 5.6*  ALBUMIN 3.7 3.2* 2.5* 2.3*    Recent Labs Lab 03/21/15 1501 03/22/15 0422  LIPASE 27 25   No results for input(s): AMMONIA in the last 168 hours. CBC:  Recent Labs Lab 03/21/15 1501 03/22/15 0422 03/23/15 0555 03/24/15 0531 03/25/15 0612 03/26/15 0431  03/27/15 0740  WBC 14.2* 16.1* 16.4* 14.3* 12.2* 8.7 14.9*  NEUTROABS 12.6* 14.0*  --   --   --   --   --   HGB 11.9* 11.0* 10.1* 10.1* 9.4* 9.5* 9.3*  HCT 35.9* 33.8* 30.9* 31.3* 29.3* 29.2* 28.6*  MCV 85.5 84.9 85.8 86.2 85.4 85.4 85.4  PLT 219 221 184 180 183 182 170   Cardiac Enzymes:  Recent Labs Lab 03/21/15 2238 03/22/15 0422 03/22/15 1108  TROPONINI 0.05* 0.07* 0.09*   BNP: Invalid input(s): POCBNP CBG:  Recent Labs Lab 03/26/15 0729 03/26/15 1142 03/26/15 1644 03/26/15 2039 03/27/15 0721  GLUCAP 155* 150* 167* 122* 251*    Recent Results (from the past 240 hour(s))  MRSA PCR Screening     Status: None   Collection Time: 03/25/15  5:00 AM  Result Value Ref Range Status   MRSA by PCR NEGATIVE NEGATIVE Final  Comment:        The GeneXpert MRSA Assay (FDA approved for NASAL specimens only), is one component of a comprehensive MRSA colonization surveillance program. It is not intended to diagnose MRSA infection nor to guide or monitor treatment for MRSA infections.   Culture, routine-abscess     Status: None (Preliminary result)   Collection Time: 03/26/15 12:19 PM  Result Value Ref Range Status   Specimen Description ABSCESS  Final   Special Requests GALLBALL  Final   Gram Stain PENDING  Incomplete   Culture   Final    Culture reincubated for better growth Performed at [facility]    Report Status PENDING  Incomplete     Scheduled Meds: . allopurinol  100 mg Oral Daily  . amLODipine  10 mg Oral q morning - 10a  . aspirin EC  81 mg Oral QPM  . calcitRIOL  0.25 mcg Oral q morning - 10a  . cholecalciferol  1,000 Units Oral BID  . enoxaparin (LOVENOX) injection  30 mg Subcutaneous Q24H  . feeding supplement (RESOURCE BREEZE)  1 Container Oral TID BM  . hydrALAZINE  50 mg Oral 3 times per day  . insulin aspart  0-9 Units Subcutaneous TID WC  . pantoprazole (PROTONIX) IV  40 mg Intravenous Q12H  . piperacillin-tazobactam (ZOSYN)  IV   2.25 g Intravenous 3 times per day  . terazosin  10 mg Oral QHS   Continuous Infusions:

## 2015-03-27 NOTE — Progress Notes (Signed)
Subjective: He is feeling OK, some fullness of abdomen.  Drain looks fine.    Objective: Vital signs in last 24 hours: Temp:  [97.8 F (36.6 C)-98.4 F (36.9 C)] 98 F (36.7 C) (05/29 0446) Pulse Rate:  [55-87] 72 (05/29 0638) Resp:  [12-20] 20 (05/29 0446) BP: (148-198)/(32-65) 190/36 mmHg (05/29 0638) SpO2:  [92 %-99 %] 93 % (05/29 0446) Weight:  [73.211 kg (161 lb 6.4 oz)] 73.211 kg (161 lb 6.4 oz) (05/28 2034) Last BM Date: 03/21/15 PO nothing recorded Drain 100 No Bm recorded Afebrile, VSS BP up some K+ 3.4 creatinine is up to 4.37, glucose up to 236, WBC up to 14.9 Intake/Output from previous day: 05/28 0701 - 05/29 0700 In: 0  Out: 1075 [Urine:975; Drains:100] Intake/Output this shift:    General appearance: alert, cooperative and no distress GI: up in chair, a little distended, no pain or tenderness.  Lab Results:   Recent Labs  03/26/15 0431 03/27/15 0740  WBC 8.7 14.9*  HGB 9.5* 9.3*  HCT 29.2* 28.6*  PLT 182 170    BMET  Recent Labs  03/26/15 0431 03/27/15 0740  NA 137 136  K 3.5 3.4*  CL 97* 97*  CO2 28 25  GLUCOSE 96 236*  BUN 113* 115*  CREATININE 3.72* 4.37*  CALCIUM 8.5* 8.4*   PT/INR  Recent Labs  03/26/15 0431  LABPROT 16.8*  INR 1.35     Recent Labs Lab 03/21/15 1501 03/22/15 0422 03/24/15 0531 03/25/15 0612  AST 25 372* 43* 32  ALT 23 292* 104* 73*  ALKPHOS 85 318* 193* 201*  BILITOT 0.9 1.2 0.9 0.8  PROT 6.6 5.9* 5.3* 5.6*  ALBUMIN 3.7 3.2* 2.5* 2.3*     Lipase     Component Value Date/Time   LIPASE 25 03/22/2015 0422     Studies/Results: Ir Perc Cholecystostomy  03/26/2015   CLINICAL DATA:  Right upper abdominal pain. Cholelithiasis and pericholecystic inflammatory changes on MR. Abnormal hepatobiliary scintigraphy indicating cystic duct obstruction.  EXAM: PERCUTANEOUS CHOLECYSTOSTOMY TUBE PLACEMENT WITH ULTRASOUND AND FLUOROSCOPIC GUIDANCE:  FLUOROSCOPY TIME:  30 seconds, 19 mGy  TECHNIQUE: The  procedure, risks (including but not limited to bleeding, infection, organ damage ), benefits, and alternatives were explained to the patient. Questions regarding the procedure were encouraged and answered. The patient understands and consents to the procedure. Survey ultrasound of the abdomen was performed and an appropriate skin entry site was identified. Skin site was marked, prepped with Betadine, and draped in usual sterile fashion, and infiltrated locally with 1% lidocaine.  Intravenous Fentanyl and Versed were administered as conscious sedation during continuous cardiorespiratory monitoring by the radiology RN, with a total moderate sedation time of 6 minutes.  Under real-time ultrasound guidance, gallbladder was accessed using a transhepatic approach with a 21-gauge needle. Ultrasound image documentation was saved. Bile returned through the hub. Needle was exchanged over a 018 guidewire for transitional dilator which allowed placement of 035 J wire. Over this, a 10.2 French pigtail catheter was advanced and formed centrally in the gallbladder lumen. Small contrast injection confirmed appropriate position. Catheter secured externally with 0 Prolene suture and placed external drain bag. Patient tolerated the procedure well.  COMPLICATIONS: COMPLICATIONS none  IMPRESSION: 1. Technically successful percutaneous cholecystostomy tube placement with ultrasound and fluoroscopic guidance.   Electronically Signed   By: Corlis Leak M.D.   On: 03/26/2015 13:54    Medications: . allopurinol  100 mg Oral Daily  . amLODipine  10 mg Oral q morning -  10a  . aspirin EC  81 mg Oral QPM  . calcitRIOL  0.25 mcg Oral q morning - 10a  . cholecalciferol  1,000 Units Oral BID  . enoxaparin (LOVENOX) injection  30 mg Subcutaneous Q24H  . feeding supplement (RESOURCE BREEZE)  1 Container Oral TID BM  . hydrALAZINE  50 mg Oral 3 times per day  . insulin aspart  0-9 Units Subcutaneous TID WC  . pantoprazole (PROTONIX) IV  40  mg Intravenous Q12H  . piperacillin-tazobactam (ZOSYN)  IV  2.25 g Intravenous 3 times per day  . terazosin  10 mg Oral QHS    Assessment/Plan Cholelithiasis possible cholecystitis  Likely a passed CBD stone  Percutaneous cholecystostomy drain placed by IR on 03/26/15 Dr. Deanne CofferHassell Leukocytosis CKD with Nephrotic syndrome Hypertension AODM Bradycardia ABX: Day 4 Zosyn  DVT:  Lovenox/SCD  Plan:  Continue drain, and follow up in office 6 weeks.       LOS: 6 days    [name] 03/27/2015

## 2015-03-27 NOTE — Progress Notes (Signed)
Referring Physician(s): CCS  Subjective: Patient states he is feeling better, minimal soreness at RUQ drain site. He is tolerating liquid diet without N/V  Allergies: Labetalol  Medications: Prior to Admission medications   Medication Sig Start Date End Date Taking? Authorizing Provider  allopurinol (ZYLOPRIM) 100 MG tablet TAKE 1 TABLET (100 MG TOTAL) BY MOUTH AT BEDTIME. 01/27/15  Yes Rhonda G Barrett, PA-C  amLODipine (NORVASC) 10 MG tablet Take 1 tablet by mouth every morning.  05/27/13  Yes Historical Provider, MD  aspirin EC 81 MG tablet Take 81 mg by mouth every evening.   Yes Historical Provider, MD  calcitRIOL (ROCALTROL) 0.25 MCG capsule Take 0.25 mcg by mouth every morning.  05/27/13  Yes Historical Provider, MD  cholecalciferol (VITAMIN D) 1000 UNITS tablet Take 1,000 Units by mouth 2 (two) times daily.    Yes Historical Provider, MD  cloNIDine (CATAPRES) 0.1 MG tablet Take 1 tablet (0.1 mg total) by mouth 3 (three) times daily. 11/04/14  Yes Rhonda G Barrett, PA-C  clopidogrel (PLAVIX) 75 MG tablet TAKE 1 TABLET BY MOUTH DAILY 11/01/14  Yes [name], MD  furosemide (LASIX) 80 MG tablet Take 1 tablet (80 mg total) by mouth daily. HOLD 48 hours, resume on 11/07/2014. Patient taking differently: Take 80 mg by mouth daily. Take 2 tablets oral three times daily 11/04/14  Yes Rhonda G Barrett, PA-C  hydrALAZINE (APRESOLINE) 50 MG tablet Take 1 tablet (50 mg total) by mouth every 8 (eight) hours. 11/04/14  Yes Rhonda G Barrett, PA-C  metolazone (ZAROXOLYN) 5 MG tablet Take 5 mg by mouth daily.   Yes Historical Provider, MD  Multiple Vitamins-Minerals (PRESERVISION AREDS PO) Take 2 tablets by mouth daily.   Yes Historical Provider, MD  simvastatin (ZOCOR) 20 MG tablet Take 1 tablet (20 mg total) by mouth daily at 6 PM. 11/09/14  Yes [name], MD  sitaGLIPtin (JANUVIA) 100 MG tablet Take 100 mg by mouth every morning. Doctor gave him samples for right now per family member    Yes Historical Provider, MD  terazosin (HYTRIN) 10 MG capsule TAKE 1 CAPSULE (10 MG TOTAL) BY MOUTH AT BEDTIME. 11/24/14  Yes [name], MD   Vital Signs: BP 164/49 mmHg  Pulse 80  Temp(Src) 97.4 F (36.3 C) (Oral)  Resp 20  Ht  (1.676 m)  Wt 161 lb 6.4 oz (73.211 kg)  BMI 26.06 kg/m2  SpO2 95%  Physical Exam General: A&Ox3, NAD Abd: distended, NT, RUQ drain intact bilious output 100cc/24hrs  Imaging: Nm Hepatobiliary Including Gb  03/23/2015   CLINICAL DATA:  Epigastric abdominal pain over the past 5 days.  EXAM: NUCLEAR MEDICINE HEPATOBILIARY IMAGING  TECHNIQUE: Sequential images of the abdomen were obtained out to 60 minutes following intravenous administration of radiopharmaceutical. At this point, 2.9 mg of intravenous morphine were administered and imaging was carried out an additional 30 min.  RADIOPHARMACEUTICALS:  5.2 mCi Tc-29m Choletec IV  COMPARISON:  No prior nuclear imaging. MRCP yesterday. CT abdomen and pelvis 03/21/2015.  FINDINGS: Hepatic uptake of Choletec is normal. Intra and extrahepatic bile ducts are not visible until approximately 20 min. Duodenal activity is visible at 25 min. At no point during the original 60 minutes of imaging did the gallbladder become visible. There is evidence of tracer retention by the liver.  The gallbladder was not visualized after morphine administration. Reflux of activity into the stomach did occur.  IMPRESSION: 1. Cystic duct obstruction consistent with acute cholecystitis, despite the  negative imaging studies thus far. The gallbladder was not visualized even after administration of intravenous morphine. 2. Cholestasis, with tracer retention by the liver. This is consistent with hepatocellular disease as there is no evidence of common bile duct obstruction. Please correlate with liver function tests. These results will be called to the ordering clinician or representative by the Radiologist Assistant, and communication documented  in the PACS or zVision Dashboard.   Electronically Signed   By: Hulan Saashomas  Lawrence M.D.   On: 03/23/2015 14:50   Ir Perc Cholecystostomy  03/26/2015   CLINICAL DATA:  Right upper abdominal pain. Cholelithiasis and pericholecystic inflammatory changes on MR. Abnormal hepatobiliary scintigraphy indicating cystic duct obstruction.  EXAM: PERCUTANEOUS CHOLECYSTOSTOMY TUBE PLACEMENT WITH ULTRASOUND AND FLUOROSCOPIC GUIDANCE:  FLUOROSCOPY TIME:  30 seconds, 19 mGy  TECHNIQUE: The procedure, risks (including but not limited to bleeding, infection, organ damage ), benefits, and alternatives were explained to the patient. Questions regarding the procedure were encouraged and answered. The patient understands and consents to the procedure. Survey ultrasound of the abdomen was performed and an appropriate skin entry site was identified. Skin site was marked, prepped with Betadine, and draped in usual sterile fashion, and infiltrated locally with 1% lidocaine.  Intravenous Fentanyl and Versed were administered as conscious sedation during continuous cardiorespiratory monitoring by the radiology RN, with a total moderate sedation time of 6 minutes.  Under real-time ultrasound guidance, gallbladder was accessed using a transhepatic approach with a 21-gauge needle. Ultrasound image documentation was saved. Bile returned through the hub. Needle was exchanged over a 018 guidewire for transitional dilator which allowed placement of 035 J wire. Over this, a 10.2 French pigtail catheter was advanced and formed centrally in the gallbladder lumen. Small contrast injection confirmed appropriate position. Catheter secured externally with 0 Prolene suture and placed external drain bag. Patient tolerated the procedure well.  COMPLICATIONS: COMPLICATIONS none  IMPRESSION: 1. Technically successful percutaneous cholecystostomy tube placement with ultrasound and fluoroscopic guidance.   Electronically Signed   By: Corlis Leak  Hassell M.D.   On:  03/26/2015 13:54    Labs:  CBC:  Recent Labs  03/24/15 0531 03/25/15 0612 03/26/15 0431 03/27/15 0740  WBC 14.3* 12.2* 8.7 14.9*  HGB 10.1* 9.4* 9.5* 9.3*  HCT 31.3* 29.3* 29.2* 28.6*  PLT 180 183 182 170    COAGS:  Recent Labs  11/01/14 0429 03/26/15 0431  INR 1.07 1.35    BMP:  Recent Labs  03/25/15 0612 03/25/15 1714 03/26/15 0431 03/27/15 0740  NA 138 140 137 136  K 3.4* 3.2* 3.5 3.4*  CL 97* 100* 97* 97*  CO2 29 28 28 25   GLUCOSE 129* 100* 96 236*  BUN 115* 119* 113* 115*  CALCIUM 8.5* 8.6* 8.5* 8.4*  CREATININE 3.84* 3.85* 3.72* 4.37*  GFRNONAA 13* 13* 14* 11*  GFRAA 15* 15* 16* 13*    LIVER FUNCTION TESTS:  Recent Labs  03/21/15 1501 03/22/15 0422 03/24/15 0531 03/25/15 0612  BILITOT 0.9 1.2 0.9 0.8  AST 25 372* 43* 32  ALT 23 292* 104* 73*  ALKPHOS 85 318* 193* 201*  PROT 6.6 5.9* 5.3* 5.6*  ALBUMIN 3.7 3.2* 2.5* 2.3*    Assessment and Plan: Cholelithiasis with possible cholecystitis, MR and HIDA abnormal indicating cystic duct obstruction  S/p perc chole drain 5/28, good output, wbc trend up-follow, afebrile, Cx pending-follow Plans per CCS, will keep chole drain in atleast 6 weeks   Signed: Berneta LevinsMORGAN, Nayleen Janosik D 03/27/2015, 11:50 AM   I spent a total of  15 Minutes in face to face in clinical consultation/evaluation, greater than 50% of which was counseling/coordinating care for cholecystitis

## 2015-03-27 NOTE — Progress Notes (Signed)
Primary cardiologist: Dr. Nanetta BattyJonathan Duran  Seen for followup: Bradycardia  Subjective:    Abdominal discomfort this AM. No chest pain or palpitations.  Objective:   Temp:  [97.5 F (36.4 C)-98.4 F (36.9 C)] 98 F (36.7 C) (05/29 0446) Pulse Rate:  [55-87] 72 (05/29 0638) Resp:  [12-20] 20 (05/29 0446) BP: (148-198)/(32-65) 190/36 mmHg (05/29 0638) SpO2:  [92 %-99 %] 93 % (05/29 0446) Weight:  [161 lb 6.4 oz (73.211 kg)] 161 lb 6.4 oz (73.211 kg) (05/28 2034) Last BM Date: 03/21/15  Filed Weights   03/25/15 0451 03/25/15 2116 03/26/15 2034  Weight: 161 lb 13.1 oz (73.4 kg) 162 lb 3.2 oz (73.573 kg) 161 lb 6.4 oz (73.211 kg)    Intake/Output Summary (Last 24 hours) at 03/27/15 0743 Last data filed at 03/27/15 0450  Gross per 24 hour  Intake      0 ml  Output   1075 ml  Net  -1075 ml    Telemetry: Sinus rhythm with PACs and PVCs.  Exam:  General: No distress.  Lungs: Clear, nonlabored.  Cardiac: RRR with ectopy with 2/6 systolic murmur.  Abdomen: Right-sided anterior percutaneous drain is dressed.  Extremities: No pitting edema.  Lab Results:  Basic Metabolic Panel:  Recent Labs Lab 03/25/15 0612 03/25/15 1714 03/26/15 0431  NA 138 140 137  K 3.4* 3.2* 3.5  CL 97* 100* 97*  CO2 29 28 28   GLUCOSE 129* 100* 96  BUN 115* 119* 113*  CREATININE 3.84* 3.85* 3.72*  CALCIUM 8.5* 8.6* 8.5*    Liver Function Tests:  Recent Labs Lab 03/22/15 0422 03/24/15 0531 03/25/15 0612  AST 372* 43* 32  ALT 292* 104* 73*  ALKPHOS 318* 193* 201*  BILITOT 1.2 0.9 0.8  PROT 5.9* 5.3* 5.6*  ALBUMIN 3.2* 2.5* 2.3*    CBC:  Recent Labs Lab 03/24/15 0531 03/25/15 0612 03/26/15 0431  WBC 14.3* 12.2* 8.7  HGB 10.1* 9.4* 9.5*  HCT 31.3* 29.3* 29.2*  MCV 86.2 85.4 85.4  PLT 180 183 182    Cardiac Enzymes:  Recent Labs Lab 03/21/15 2238 03/22/15 0422 03/22/15 1108  TROPONINI 0.05* 0.07* 0.09*    Echocardiogram 03/23/2015: Study Conclusions  -  Left ventricle: LV function is hyperdynamic. The cavity size was normal. Wall thickness was increased in a pattern of moderate LVH. The estimated ejection fraction was 75%. Wall motion was normal; there were no regional wall motion abnormalities. - Left atrium: The atrium was mildly dilated. - Right ventricle: The cavity size was normal. Systolic function was normal.  ECG:  Sinus bradycardia with LVH.   Medications:   Scheduled Medications: . allopurinol  100 mg Oral Daily  . amLODipine  10 mg Oral q morning - 10a  . aspirin EC  81 mg Oral QPM  . calcitRIOL  0.25 mcg Oral q morning - 10a  . cholecalciferol  1,000 Units Oral BID  . enoxaparin (LOVENOX) injection  30 mg Subcutaneous Q24H  . feeding supplement (RESOURCE BREEZE)  1 Container Oral TID BM  . hydrALAZINE  50 mg Oral 3 times per day  . insulin aspart  0-9 Units Subcutaneous TID WC  . pantoprazole (PROTONIX) IV  40 mg Intravenous Q12H  . piperacillin-tazobactam (ZOSYN)  IV  2.25 g Intravenous 3 times per day  . terazosin  10 mg Oral QHS     PRN Medications: acetaminophen **OR** acetaminophen, hydrALAZINE, HYDROcodone-acetaminophen, morphine injection, ondansetron **OR** ondansetron (ZOFRAN) IV   Assessment:   1. Bradycardia, intermittent and  not clearly symptomatic. Not an any standing heart rate control medications.  2. Essential hypertension. On Norvasc and Hydralazine. Blood pressure trend up with discomfort.  3. History of CAD, no active angina.  4. 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  5. Cholecystitis, status post percutaneous drainage per IR on May 28.   Plan/Discussion:    Continue telemetry and observation. No medication changes for now. If blood pressure remains elevated after pain is better controlled, can consider increasing Hydralazine.  [name], M.D., F.A.C.C.

## 2015-03-27 NOTE — Progress Notes (Signed)
ANTIBIOTIC CONSULT NOTE - Follow-up  Pharmacy Consult for Zosyn Indication: acute cholecystitis  Allergies  Allergen Reactions  . Labetalol Swelling    angioedema    Patient Measurements: Height: 5\' 6"  (167.6 cm) Weight: 161 lb 6.4 oz (73.211 kg) IBW/kg (Calculated) : 63.8 Adjusted Body Weight:    Vital Signs: Temp: 98 F (36.7 C) (05/29 0446) Temp Source: Oral (05/29 0446) BP: 190/36 mmHg (05/29 14780638) Pulse Rate: 72 (05/29 0638) Intake/Output from previous day: 05/28 0701 - 05/29 0700 In: 0  Out: 1075 [Urine:975; Drains:100] Intake/Output from this shift:    Labs:  Recent Labs  03/25/15 0612 03/25/15 1714 03/26/15 0431  WBC 12.2*  --  8.7  HGB 9.4*  --  9.5*  PLT 183  --  182  CREATININE 3.84* 3.85* 3.72*   Estimated Creatinine Clearance: 13.8 mL/min (by C-G formula based on Cr of 3.72). No results for input(s): VANCOTROUGH, VANCOPEAK, VANCORANDOM, GENTTROUGH, GENTPEAK, GENTRANDOM, TOBRATROUGH, TOBRAPEAK, TOBRARND, AMIKACINPEAK, AMIKACINTROU, AMIKACIN in the last 72 hours.   Microbiology: Recent Results (from the past 720 hour(s))  MRSA PCR Screening     Status: None   Collection Time: 03/25/15  5:00 AM  Result Value Ref Range Status   MRSA by PCR NEGATIVE NEGATIVE Final    Comment:        The GeneXpert MRSA Assay (FDA approved for NASAL specimens only), is one component of a comprehensive MRSA colonization surveillance program. It is not intended to diagnose MRSA infection nor to guide or monitor treatment for MRSA infections.   Culture, routine-abscess     Status: None (Preliminary result)   Collection Time: 03/26/15 12:19 PM  Result Value Ref Range Status   Specimen Description ABSCESS  Final   Special Requests GALLBALL  Final   Gram Stain PENDING  Incomplete   Culture   Final    Culture reincubated for better growth Performed at [facility]    Report Status PENDING  Incomplete    Medical History: Past Medical History   Diagnosis Date  . Diabetes mellitus   . Hypertension   . Hypercholesteremia   . Gout   . PVD (peripheral vascular disease) 7/05    LCE, known 60% RICA 7/12  . CAD (coronary artery disease) 2002    non critical  . Macular degeneration   . Chronic renal disease, stage III   . Claudication   . Palpitations     PVCs and PSVT on Holter monitoring  . Bilateral carotid artery disease     status post left carotid endarterectomy performed by Dr. Madilyn FiremanHayes 05/12/04  . Anemia     Medications:  Prescriptions prior to admission  Medication Sig Dispense Refill Last Dose  . allopurinol (ZYLOPRIM) 100 MG tablet TAKE 1 TABLET (100 MG TOTAL) BY MOUTH AT BEDTIME. 30 tablet 2 03/21/2015 at Unknown time  . amLODipine (NORVASC) 10 MG tablet Take 1 tablet by mouth every morning.    03/21/2015 at Unknown time  . aspirin EC 81 MG tablet Take 81 mg by mouth every evening.   03/21/2015 at Unknown time  . calcitRIOL (ROCALTROL) 0.25 MCG capsule Take 0.25 mcg by mouth every morning.    03/21/2015 at Unknown time  . cholecalciferol (VITAMIN D) 1000 UNITS tablet Take 1,000 Units by mouth 2 (two) times daily.    03/21/2015 at Unknown time  . cloNIDine (CATAPRES) 0.1 MG tablet Take 1 tablet (0.1 mg total) by mouth 3 (three) times daily. 90 tablet 11 03/21/2015 at Unknown time  .  clopidogrel (PLAVIX) 75 MG tablet TAKE 1 TABLET BY MOUTH DAILY 30 tablet 10 03/21/2015 at Unknown time  . furosemide (LASIX) 80 MG tablet Take 1 tablet (80 mg total) by mouth daily. HOLD 48 hours, resume on 11/07/2014. (Patient taking differently: Take 80 mg by mouth daily. Take 2 tablets oral three times daily) 30 tablet 2 03/21/2015 at Unknown time  . hydrALAZINE (APRESOLINE) 50 MG tablet Take 1 tablet (50 mg total) by mouth every 8 (eight) hours. 90 tablet 11 03/21/2015 at Unknown time  . metolazone (ZAROXOLYN) 5 MG tablet Take 5 mg by mouth daily.   03/21/2015 at Unknown time  . Multiple Vitamins-Minerals (PRESERVISION AREDS PO) Take 2 tablets by  mouth daily.   03/21/2015 at Unknown time  . simvastatin (ZOCOR) 20 MG tablet Take 1 tablet (20 mg total) by mouth daily at 6 PM. 90 tablet 3 03/20/2015 at Unknown time  . sitaGLIPtin (JANUVIA) 100 MG tablet Take 100 mg by mouth every morning. Doctor gave him samples for right now per family member   03/21/2015 at Unknown time  . terazosin (HYTRIN) 10 MG capsule TAKE 1 CAPSULE (10 MG TOTAL) BY MOUTH AT BEDTIME. 90 capsule 2 03/20/2015 at Unknown time   Assessment: 81 y/o M with significant PMH presented to [facility] ED with worsening intermittent epigastric pain. Pharmacy consulted to dose Zosyn for cholecystitis, possible cholelithiasis. Cholecystostomy tube placed successfully on 5/28. Pt afebrile, WBC trending down to 8.7, cultures pending. Stage 4 CKD, current CrCl ~ 3mL/min.  Goal of Therapy:  Eradiation of infection  Plan:  - Continue Zosyn 2.25g IV q8h - Monitor cultures, renal function, LOT, and deescalation if possible   Megan E. Supple, Pharm.D Clinical Pharmacy Resident Pager: [phone] 03/27/2015 8:39 AM    Supple, Emeline Darling 03/27/2015,8:37 AM

## 2015-03-28 DIAGNOSIS — N189 Chronic kidney disease, unspecified: Secondary | ICD-10-CM

## 2015-03-28 DIAGNOSIS — N179 Acute kidney failure, unspecified: Secondary | ICD-10-CM

## 2015-03-28 LAB — CBC
HCT: 29.4 % — ABNORMAL LOW (ref 39.0–52.0)
HEMOGLOBIN: 9.4 g/dL — AB (ref 13.0–17.0)
MCH: 27.2 pg (ref 26.0–34.0)
MCHC: 32 g/dL (ref 30.0–36.0)
MCV: 85 fL (ref 78.0–100.0)
PLATELETS: 186 10*3/uL (ref 150–400)
RBC: 3.46 MIL/uL — AB (ref 4.22–5.81)
RDW: 17.1 % — AB (ref 11.5–15.5)
WBC: 13.3 10*3/uL — ABNORMAL HIGH (ref 4.0–10.5)

## 2015-03-28 LAB — GLUCOSE, CAPILLARY
GLUCOSE-CAPILLARY: 164 mg/dL — AB (ref 65–99)
GLUCOSE-CAPILLARY: 147 mg/dL — AB (ref 65–99)
Glucose-Capillary: 223 mg/dL — ABNORMAL HIGH (ref 65–99)
Glucose-Capillary: 147 mg/dL — ABNORMAL HIGH (ref 65–99)

## 2015-03-28 LAB — COMPREHENSIVE METABOLIC PANEL
ALBUMIN: 2.3 g/dL — AB (ref 3.5–5.0)
ALT: 52 U/L (ref 17–63)
AST: 27 U/L (ref 15–41)
Alkaline Phosphatase: 146 U/L — ABNORMAL HIGH (ref 38–126)
Anion gap: 14 (ref 5–15)
BILIRUBIN TOTAL: 0.8 mg/dL (ref 0.3–1.2)
BUN: 112 mg/dL — AB (ref 6–20)
CO2: 25 mmol/L (ref 22–32)
Calcium: 8.8 mg/dL — ABNORMAL LOW (ref 8.9–10.3)
Chloride: 98 mmol/L — ABNORMAL LOW (ref 101–111)
Creatinine, Ser: 4.94 mg/dL — ABNORMAL HIGH (ref 0.61–1.24)
GFR calc non Af Amer: 10 mL/min — ABNORMAL LOW (ref >60)
GFR, EST AFRICAN AMERICAN: 11 mL/min — AB (ref >60)
Glucose, Bld: 157 mg/dL — ABNORMAL HIGH (ref 65–99)
POTASSIUM: 3.6 mmol/L (ref 3.5–5.1)
SODIUM: 137 mmol/L (ref 135–145)
Total Protein: 5.9 g/dL — ABNORMAL LOW (ref 6.5–8.1)

## 2015-03-28 MED ORDER — POLYETHYLENE GLYCOL 3350 17 G PO PACK
17.0000 g | PACK | Freq: Every day | ORAL | Status: DC
  Administered 2015-03-28 – 2015-03-29 (×2): 17 g via ORAL
  Filled 2015-03-28 (×2): qty 1

## 2015-03-28 MED ORDER — BISACODYL 10 MG RE SUPP
10.0000 mg | Freq: Once | RECTAL | Status: DC
Start: 2015-03-28 — End: 2015-03-29

## 2015-03-28 MED ORDER — SORBITOL 70 % SOLN
30.0000 mL | Freq: Once | Status: AC
  Administered 2015-03-28: 30 mL via ORAL
  Filled 2015-03-28: qty 30

## 2015-03-28 NOTE — Progress Notes (Signed)
Patient ID: [name], male   DOB: 06/18/1930, 81 y.o.   MRN: [id number] [facility] PROGRESS NOTE  [name]:[id number] DOB: [birth year] DOA: 03/21/2015 PCP: Bryan Raider, MD  Brief narrative:    81 y.o. male with past medical history of peripheral vascular disease status post left lower extremity stenting, chronic kidney disease stage 3-4, hypertension, hyperlipidemia, asthma who presented to [facility] ED with worsening intermittent epigastric pain for past few days prior to this admission. Pain is described as gnawing, worse with eating so this led to patient having poor po intake over past day or so. His pain is resolved at this point.   On admission, his BP was 197/45, afebrile and oxygen saturation 90-97% on room air. Blood work was notable for leukocytosis of 14.2, hemoglobin of 11.9, potassium of 3.5, creatinine of 3.7. Of note, per patient he just saw nephrologist who stopped lasix and zaroxolyn due to significant weight loss over past 6 months. CT abdomen and pelvis on this admission showed cholelithiasis and hiatal hernia with reflux. LFT's were WNL on this admission. Lipase was WNL.     Assessment/Plan:    HPI/Subjective: He is feeling well. No worsening abdominal pain No BM yet. Passing gas.   Possible Cholecystitis:  Epigastric abdominal pain, Transaminases;  -  Lipase is WNL. - GI has saw the pt in consultation. recommended MRCP -MRCP; Gallstone. No evidence of choledocholithiasis or biliary obstruction.  -hold statin due to elevated LFT. Repeat labs in am./  -HIDA scan consistent with cholecystitis.  -S/P cholecystostomy tube placement 5-28.  -KUB rule negative for ileus.  -Resume plavix when ok by surgery -start laxatives -culture gallbladder: abundant gram positive rods, and gram negative rods.   Essential hypertension - Continue Norvasc, , hydralazine - Order placed for hydralazine PRN if BP above 150/90 -clonidine on hold due to bradycardia.    Bradycardia; asymptomatic.  Appreciate cardiology/   Peripheral arterial disease - Continue aspirin , plavix on hold for procedure   Renal failure (ARF), acute on chronic, stage 4 - Creatinine 3.7 on this admission - Recent baseline in 10/2014 was 3.09.  -Continue to hold lasix and zaroxolyn -renal function stable. Appreciate Dr [employer] evaluation.  -cr increasing today 4.9. Renal to follow.   Leukocytosis; secondary to cholecystitis.  UA negative, chest x ary negative,.  HIDA scan positive for cholecystitis.  Continue to monitor. Patient on zosyn.   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   Troponin elevation - Likely demand ischemia from CKD - Cardio consulted - No acute ischemic changes on 12 lead EKG - No complaints of chest pain -Resume plavix when ok by surgery  Dyslipidemia -hold statin due to elevated LFT>    DVT Prophylaxis  - On Lovenox subQ in hospital    Code Status: Full.  Family Communication:  plan of care discussed with the patient Disposition Plan: Awaiting improvement of renal function, diet to be advance.   IV access:  Peripheral IV  Procedures and diagnostic studies:    Ct Abdomen Pelvis Wo Contrast 03/21/2015   1. Severe atherosclerotic calcifications involving the aorta and branch vessels. 2. Moderate-sized hiatal hernia with reflux of oral contrast into the distal esophagus. 3. Cholelithiasis. 4. No acute abdominal/pelvic findings, mass lesions or adenopathy.   Electronically  Signed   By: Bryan MeyerP.  Duran M.D.   On: 03/21/2015 19:07   Dg Chest 2 View 03/21/2015    No active cardiopulmonary disease.   Electronically Signed   By: Bryan Portlandavid  Duran M.D.   On: 03/21/2015 16:51   Medical Consultants:  Gastroenterology Cardiology   Other  Consultants:  None   IAnti-Infectives:   None    [name], Bryan Horsman A, MD  [facility] Pager [phone]  Time spent in minutes: 25 minutes  If 7PM-7AM, please contact night-coverage [email or web address] Password [email or web address] 03/28/2015, 12:55 PM   LOS: 7 days     Objective: Filed Vitals:   03/27/15 1752 03/27/15 2042 03/28/15 0448 03/28/15 0830  BP: 156/52 108/44 175/42 160/56  Pulse: 85 88 86 59  Temp: 98.1 F (36.7 C) 98.4 F (36.9 C) 97.8 F (36.6 C) 97.6 F (36.4 C)  TempSrc: Oral Oral Oral Oral  Resp: 20 18 18 17   Height:      Weight:  74.072 kg (163 lb 4.8 oz)    SpO2: 96% 92% 94% 92%    Intake/Output Summary (Last 24 hours) at 03/28/15 1255 Last data filed at 03/28/15 1145  Gross per 24 hour  Intake    970 ml  Output   1040 ml  Net    -70 ml    Exam:   General:  Pt is alert, follows commands appropriately  Cardiovascular: Regular rate and rhythm, S1/S2 (+)  Respiratory: Clear to auscultation bilaterally, no wheezing, no crackles, no rhonchi  Abdomen: Soft, mild right UQ  tender, non distended, bowel sounds present, cholecystostomy tube in place.   Extremities: No edema, pulses DP and PT palpable bilaterally  Data Reviewed: Basic Metabolic Panel:  Recent Labs Lab 03/25/15 0612 03/25/15 1714 03/26/15 0431 03/27/15 0740 03/28/15 0536  NA 138 140 137 136 137  K 3.4* 3.2* 3.5 3.4* 3.6  CL 97* 100* 97* 97* 98*  CO2 29 28 28 25 25   GLUCOSE 129* 100* 96 236* 157*  BUN 115* 119* 113* 115* 112*  CREATININE 3.84* 3.85* 3.72* 4.37* 4.94*  CALCIUM 8.5* 8.6* 8.5* 8.4* 8.8*   Liver Function Tests:  Recent Labs Lab 03/21/15 1501 03/22/15 0422 03/24/15 0531 03/25/15 0612 03/28/15 0536  AST 25 372* 43* 32 27  ALT 23 292* 104* 73* 52  ALKPHOS 85 318* 193* 201* 146*  BILITOT 0.9 1.2 0.9 0.8 0.8  PROT 6.6 5.9* 5.3* 5.6* 5.9*  ALBUMIN 3.7 3.2* 2.5* 2.3* 2.3*    Recent Labs Lab 03/21/15 1501 03/22/15 0422  LIPASE 27 25   No results for  input(s): AMMONIA in the last 168 hours. CBC:  Recent Labs Lab 03/21/15 1501 03/22/15 0422  03/24/15 0531 03/25/15 0612 03/26/15 0431 03/27/15 0740 03/28/15 0536  WBC 14.2* 16.1*  < > 14.3* 12.2* 8.7 14.9* 13.3*  NEUTROABS 12.6* 14.0*  --   --   --   --   --   --   HGB 11.9* 11.0*  < > 10.1* 9.4* 9.5* 9.3* 9.4*  HCT 35.9* 33.8*  < > 31.3* 29.3* 29.2* 28.6* 29.4*  MCV 85.5 84.9  < > 86.2 85.4 85.4 85.4 85.0  PLT 219 221  < > 180 183 182 170 186  < > = values in this interval not displayed. Cardiac Enzymes:  Recent Labs Lab 03/21/15 2238 03/22/15 0422 03/22/15 1108  TROPONINI 0.05* 0.07* 0.09*   BNP: Invalid input(s): POCBNP CBG:  Recent Labs Lab 03/27/15 1149 03/27/15 1641 03/27/15 2044 03/28/15 0732 03/28/15 1115  GLUCAP 206* 163* 207* 164* 223*    Recent Results (from the past 240 hour(s))  MRSA PCR Screening     Status: None   Collection Time: 03/25/15  5:00 AM  Result Value Ref Range Status   MRSA by PCR NEGATIVE NEGATIVE Final    Comment:        The GeneXpert MRSA Assay (FDA approved for NASAL specimens only), is one component of [facility] comprehensive MRSA colonization surveillance program. It is not intended to diagnose MRSA infection nor to guide or monitor treatment for MRSA infections.   Culture, routine-abscess     Status: None (Preliminary result)   Collection Time: 03/26/15 12:19 PM  Result Value Ref Range Status   Specimen Description ABSCESS  Final   Special Requests GALLBALL  Final   Gram Stain   Final    RARE WBC PRESENT, PREDOMINANTLY MONONUCLEAR NO SQUAMOUS EPITHELIAL CELLS SEEN ABUNDANT GRAM POSITIVE RODS FEW GRAM NEGATIVE RODS Performed at [facility]    Culture   Final    ABUNDANT GRAM NEGATIVE RODS Performed at [facility]    Report Status PENDING  Incomplete     Scheduled Meds: . allopurinol  100 mg Oral Daily  . amLODipine  10 mg Oral q morning - 10a  . aspirin EC  81 mg Oral QPM  . calcitRIOL  0.25  mcg Oral q morning - 10a  . cholecalciferol  1,000 Units Oral BID  . enoxaparin (LOVENOX) injection  30 mg Subcutaneous Q24H  . feeding supplement (RESOURCE BREEZE)  1 Container Oral TID BM  . hydrALAZINE  50 mg Oral 3 times per day  . insulin aspart  0-9 Units Subcutaneous TID WC  . pantoprazole (PROTONIX) IV  40 mg Intravenous Q12H  . piperacillin-tazobactam (ZOSYN)  IV  2.25 g Intravenous 3 times per day  . polyethylene glycol  17 g Oral Daily  . terazosin  10 mg Oral QHS   Continuous Infusions:

## 2015-03-28 NOTE — Progress Notes (Signed)
Subjective:  No CP/SOB. C/O RUQ discomfort  Objective:  Temp:  [97.6 F (36.4 C)-98.4 F (36.9 C)] 97.6 F (36.4 C) (05/30 0830) Pulse Rate:  [59-88] 59 (05/30 0830) Resp:  [17-20] 17 (05/30 0830) BP: (108-175)/(42-56) 160/56 mmHg (05/30 0830) SpO2:  [92 %-96 %] 92 % (05/30 0830) Weight:  [163 lb 4.8 oz (74.072 kg)] 163 lb 4.8 oz (74.072 kg) (05/29 2042) Weight change: 1 lb 14.4 oz (0.862 kg)  Intake/Output from previous day: 05/29 0701 - 05/30 0700 In: 850 [P.O.:320; IV Piggyback:50] Out: 855 [Urine:700; Drains:155]  Intake/Output from this shift: Total I/O In: 360 [P.O.:360] Out: 300 [Urine:300]  Physical Exam: General appearance: alert and mild distress Neck: no adenopathy, no carotid bruit, no JVD, supple, symmetrical, trachea midline and thyroid not enlarged, symmetric, no tenderness/mass/nodules Lungs: clear to auscultation bilaterally Heart: Soft outflow tract murmur Extremities: extremities normal, atraumatic, no cyanosis or edema  Lab Results: Results for orders placed or performed during the hospital encounter of 03/21/15 (from the past 48 hour(s))  Glucose, capillary     Status: Abnormal   Collection Time: 03/26/15 11:42 AM  Result Value Ref Range   Glucose-Capillary 150 (H) 65 - 99 mg/dL   Comment 1 Notify RN    Comment 2 Document in Chart   Culture, routine-abscess     Status: None (Preliminary result)   Collection Time: 03/26/15 12:19 PM  Result Value Ref Range   Specimen Description ABSCESS    Special Requests GALLBALL    Gram Stain      RARE WBC PRESENT, PREDOMINANTLY MONONUCLEAR NO SQUAMOUS EPITHELIAL CELLS SEEN ABUNDANT GRAM POSITIVE RODS FEW GRAM NEGATIVE RODS Performed at [place] Performed at [facility]    Report Status PENDING   Glucose, capillary     Status: Abnormal   Collection Time: 03/26/15  4:44 PM  Result Value Ref Range   Glucose-Capillary 167 (H) 65 -  99 mg/dL  Glucose, capillary     Status: Abnormal   Collection Time: 03/26/15  8:39 PM  Result Value Ref Range   Glucose-Capillary 122 (H) 65 - 99 mg/dL  Glucose, capillary     Status: Abnormal   Collection Time: 03/27/15  7:21 AM  Result Value Ref Range   Glucose-Capillary 251 (H) 65 - 99 mg/dL   Comment 1 Notify RN    Comment 2 Document in Chart   CBC     Status: Abnormal   Collection Time: 03/27/15  7:40 AM  Result Value Ref Range   WBC 14.9 (H) 4.0 - 10.5 K/uL   RBC 3.35 (L) 4.22 - 5.81 MIL/uL   Hemoglobin 9.3 (L) 13.0 - 17.0 g/dL   HCT 28.6 (L) 39.0 - 52.0 %   MCV 85.4 78.0 - 100.0 fL   MCH 27.8 26.0 - 34.0 pg   MCHC 32.5 30.0 - 36.0 g/dL   RDW 17.2 (H) 11.5 - 15.5 %   Platelets 170 150 - 400 K/uL  Basic metabolic panel     Status: Abnormal   Collection Time: 03/27/15  7:40 AM  Result Value Ref Range   Sodium 136 135 - 145 mmol/L   Potassium 3.4 (L) 3.5 - 5.1 mmol/L   Chloride 97 (L) 101 - 111 mmol/L   CO2 25 22 - 32 mmol/L   Glucose, Bld 236 (H) 65 - 99 mg/dL   BUN 115 (H) 6 - 20 mg/dL  Creatinine, Ser 4.37 (H) 0.61 - 1.24 mg/dL   Calcium 8.4 (L) 8.9 - 10.3 mg/dL   GFR calc non Af Amer 11 (L) >60 mL/min   GFR calc Af Amer 13 (L) >60 mL/min    Comment: (NOTE) The eGFR has been calculated using the CKD EPI equation. This calculation has not been validated in all clinical situations. eGFR's persistently <60 mL/min signify possible Chronic Kidney Disease.    Anion gap 14 5 - 15  Glucose, capillary     Status: Abnormal   Collection Time: 03/27/15 11:49 AM  Result Value Ref Range   Glucose-Capillary 206 (H) 65 - 99 mg/dL   Comment 1 Notify RN    Comment 2 Document in Chart   Glucose, capillary     Status: Abnormal   Collection Time: 03/27/15  4:41 PM  Result Value Ref Range   Glucose-Capillary 163 (H) 65 - 99 mg/dL   Comment 1 Notify RN    Comment 2 Document in Chart   Glucose, capillary     Status: Abnormal   Collection Time: 03/27/15  8:44 PM  Result Value  Ref Range   Glucose-Capillary 207 (H) 65 - 99 mg/dL  CBC     Status: Abnormal   Collection Time: 03/28/15  5:36 AM  Result Value Ref Range   WBC 13.3 (H) 4.0 - 10.5 K/uL   RBC 3.46 (L) 4.22 - 5.81 MIL/uL   Hemoglobin 9.4 (L) 13.0 - 17.0 g/dL   HCT 29.4 (L) 39.0 - 52.0 %   MCV 85.0 78.0 - 100.0 fL   MCH 27.2 26.0 - 34.0 pg   MCHC 32.0 30.0 - 36.0 g/dL   RDW 17.1 (H) 11.5 - 15.5 %   Platelets 186 150 - 400 K/uL  Comprehensive metabolic panel     Status: Abnormal   Collection Time: 03/28/15  5:36 AM  Result Value Ref Range   Sodium 137 135 - 145 mmol/L   Potassium 3.6 3.5 - 5.1 mmol/L   Chloride 98 (L) 101 - 111 mmol/L   CO2 25 22 - 32 mmol/L   Glucose, Bld 157 (H) 65 - 99 mg/dL   BUN 112 (H) 6 - 20 mg/dL   Creatinine, Ser 4.94 (H) 0.61 - 1.24 mg/dL   Calcium 8.8 (L) 8.9 - 10.3 mg/dL   Total Protein 5.9 (L) 6.5 - 8.1 g/dL   Albumin 2.3 (L) 3.5 - 5.0 g/dL   AST 27 15 - 41 U/L   ALT 52 17 - 63 U/L   Alkaline Phosphatase 146 (H) 38 - 126 U/L   Total Bilirubin 0.8 0.3 - 1.2 mg/dL   GFR calc non Af Amer 10 (L) >60 mL/min   GFR calc Af Amer 11 (L) >60 mL/min    Comment: (NOTE) The eGFR has been calculated using the CKD EPI equation. This calculation has not been validated in all clinical situations. eGFR's persistently <60 mL/min signify possible Chronic Kidney Disease.    Anion gap 14 5 - 15  Glucose, capillary     Status: Abnormal   Collection Time: 03/28/15  7:32 AM  Result Value Ref Range   Glucose-Capillary 164 (H) 65 - 99 mg/dL    Imaging: Imaging results have been reviewed  Assessment/Plan:   1. Principal Problem: 2.   Abdominal pain 3. Active Problems: 4.   Hypertension 5.   Peripheral arterial disease 6.   Renal failure (ARF), acute on chronic 7.   Diabetes mellitus type 2, controlled 8.   Epigastric  pain 9.   Elevated liver enzymes 10.   Abdominal pain, acute, right upper quadrant 11.   Elevated troponin 12.   Acute cholecystitis 13.   Abnormal liver  function tests 14.   Time Spent Directly with Patient:  20 minutes  Length of Stay:  LOS: 7 days   Pt admitted with RUQ pain thought to have passed a GS. S/P IR perc cholecystostomy tube for decompression. H/O non critical CAD in past as well as HTN and PAD (S/P LCIA stent 1/16 with improved LLE Sx but with RCN. 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 No indication for HD at this time per Renal.  Cardiac stable. BP has been elevated and he has asymptomatic SB on no neg chronotropes. Clonidine can cause brady. Otherwise on amlodipine and hydralazine which can be titrated. Will follow with you.   Quay Burow 03/28/2015, 9:27 AM

## 2015-03-28 NOTE — Progress Notes (Signed)
Subjective: His abdomen is sore and distended, no BM since admit 5/26.  Drainage from the GB looks fine site itches but no other issues with it.    Objective: Vital signs in last 24 hours: Temp:  [97.6 F (36.4 C)-98.4 F (36.9 C)] 97.6 F (36.4 C) (05/30 0830) Pulse Rate:  [59-88] 59 (05/30 0830) Resp:  [17-20] 17 (05/30 0830) BP: (108-175)/(42-56) 160/56 mmHg (05/30 0830) SpO2:  [92 %-96 %] 92 % (05/30 0830) Weight:  [74.072 kg (163 lb 4.8 oz)] 74.072 kg (163 lb 4.8 oz) (05/29 2042) Last BM Date: 03/21/15 320 PO recorded/ clear liquid diet Drain 155 No BM Afebrile, VSS WBC trending down slowly, H/H is stable Single view abdomen this AM: Right upper quadrant percutaneous cholecystostomy tube is identified. Enteric contrast material is identified within the right side of colon. No dilated loops of small bowel identified. Intake/Output from previous day: 05/29 0701 - 05/30 0700 In: 850 [P.O.:320; IV Piggyback:50] Out: 855 [Urine:700; Drains:155] Intake/Output this shift: Total I/O In: 360 [P.O.:360] Out: 300 [Urine:300]  General appearance: alert, cooperative and no distress GI: distended tympanic, few BS, NO BM.  On clears without nausea or vomiting  Lab Results:   Recent Labs  03/27/15 0740 03/28/15 0536  WBC 14.9* 13.3*  HGB 9.3* 9.4*  HCT 28.6* 29.4*  PLT 170 186    BMET  Recent Labs  03/27/15 0740 03/28/15 0536  NA 136 137  K 3.4* 3.6  CL 97* 98*  CO2 25 25  GLUCOSE 236* 157*  BUN 115* 112*  CREATININE 4.37* 4.94*  CALCIUM 8.4* 8.8*   PT/INR  Recent Labs  03/26/15 0431  LABPROT 16.8*  INR 1.35     Recent Labs Lab 03/21/15 1501 03/22/15 0422 03/24/15 0531 03/25/15 0612 03/28/15 0536  AST 25 372* 43* 32 27  ALT 23 292* 104* 73* 52  ALKPHOS 85 318* 193* 201* 146*  BILITOT 0.9 1.2 0.9 0.8 0.8  PROT 6.6 5.9* 5.3* 5.6* 5.9*  ALBUMIN 3.7 3.2* 2.5* 2.3* 2.3*     Lipase     Component Value Date/Time   LIPASE 25 03/22/2015 0422      Studies/Results: Dg Abd 1 View  03/27/2015   CLINICAL DATA:  Constipation and lower abdominal pain.  EXAM: ABDOMEN - 1 VIEW  COMPARISON:  03/22/2015  FINDINGS: Right upper quadrant percutaneous cholecystostomy tube is identified. Enteric contrast material is identified within the right side of colon. No dilated loops of small bowel identified.  IMPRESSION: 1. Nonobstructive bowel gas pattern. 2. Cholecystostomy tube noted within the right upper quadrant the abdomen.   Electronically Signed   By: Signa Kellaylor  Stroud M.D.   On: 03/27/2015 15:12   Ir Perc Cholecystostomy  03/26/2015   CLINICAL DATA:  Right upper abdominal pain. Cholelithiasis and pericholecystic inflammatory changes on MR. Abnormal hepatobiliary scintigraphy indicating cystic duct obstruction.  EXAM: PERCUTANEOUS CHOLECYSTOSTOMY TUBE PLACEMENT WITH ULTRASOUND AND FLUOROSCOPIC GUIDANCE:  FLUOROSCOPY TIME:  30 seconds, 19 mGy  TECHNIQUE: The procedure, risks (including but not limited to bleeding, infection, organ damage ), benefits, and alternatives were explained to the patient. Questions regarding the procedure were encouraged and answered. The patient understands and consents to the procedure. Survey ultrasound of the abdomen was performed and an appropriate skin entry site was identified. Skin site was marked, prepped with Betadine, and draped in usual sterile fashion, and infiltrated locally with 1% lidocaine.  Intravenous Fentanyl and Versed were administered as conscious sedation during continuous cardiorespiratory monitoring by the radiology RN, with  a total moderate sedation time of 6 minutes.  Under real-time ultrasound guidance, gallbladder was accessed using a transhepatic approach with a 21-gauge needle. Ultrasound image documentation was saved. Bile returned through the hub. Needle was exchanged over a 018 guidewire for transitional dilator which allowed placement of 035 J wire. Over this, a 10.2 French pigtail catheter was advanced  and formed centrally in the gallbladder lumen. Small contrast injection confirmed appropriate position. Catheter secured externally with 0 Prolene suture and placed external drain bag. Patient tolerated the procedure well.  COMPLICATIONS: COMPLICATIONS none  IMPRESSION: 1. Technically successful percutaneous cholecystostomy tube placement with ultrasound and fluoroscopic guidance.   Electronically Signed   By: Corlis Leak M.D.   On: 03/26/2015 13:54    Medications: . allopurinol  100 mg Oral Daily  . amLODipine  10 mg Oral q morning - 10a  . aspirin EC  81 mg Oral QPM  . calcitRIOL  0.25 mcg Oral q morning - 10a  . cholecalciferol  1,000 Units Oral BID  . enoxaparin (LOVENOX) injection  30 mg Subcutaneous Q24H  . feeding supplement (RESOURCE BREEZE)  1 Container Oral TID BM  . hydrALAZINE  50 mg Oral 3 times per day  . insulin aspart  0-9 Units Subcutaneous TID WC  . pantoprazole (PROTONIX) IV  40 mg Intravenous Q12H  . piperacillin-tazobactam (ZOSYN)  IV  2.25 g Intravenous 3 times per day  . terazosin  10 mg Oral QHS    Assessment/Plan Cholelithiasis possible cholecystitis  Likely a passed CBD stone  Percutaneous cholecystostomy drain placed by IR on 03/26/15 Dr. Deanne Coffer Leukocytosis CKD Stage IV with Nephrotic syndrome Hypertension AODM Bradycardia ABX: Day 5 Zosyn  DVT: Lovenox/SCD    Plan:  I think from his drain he is doing fine.  He has contrast in his right colon and I would think he is most likely constipated.  Will defer to Primary care on this.    LOS: 7 days    Gurjit Loconte 03/28/2015

## 2015-03-29 LAB — BASIC METABOLIC PANEL
Anion gap: 14 (ref 5–15)
BUN: 107 mg/dL — ABNORMAL HIGH (ref 6–20)
CO2: 25 mmol/L (ref 22–32)
Calcium: 8.9 mg/dL (ref 8.9–10.3)
Chloride: 97 mmol/L — ABNORMAL LOW (ref 101–111)
Creatinine, Ser: 4.66 mg/dL — ABNORMAL HIGH (ref 0.61–1.24)
GFR calc Af Amer: 12 mL/min — ABNORMAL LOW (ref >60)
GFR calc non Af Amer: 11 mL/min — ABNORMAL LOW (ref >60)
GLUCOSE: 233 mg/dL — AB (ref 65–99)
Potassium: 3.4 mmol/L — ABNORMAL LOW (ref 3.5–5.1)
SODIUM: 136 mmol/L (ref 135–145)

## 2015-03-29 LAB — CBC
HCT: 30 % — ABNORMAL LOW (ref 39.0–52.0)
Hemoglobin: 9.8 g/dL — ABNORMAL LOW (ref 13.0–17.0)
MCH: 27.6 pg (ref 26.0–34.0)
MCHC: 32.7 g/dL (ref 30.0–36.0)
MCV: 84.5 fL (ref 78.0–100.0)
PLATELETS: 198 10*3/uL (ref 150–400)
RBC: 3.55 MIL/uL — ABNORMAL LOW (ref 4.22–5.81)
RDW: 16.9 % — AB (ref 11.5–15.5)
WBC: 10.6 10*3/uL — ABNORMAL HIGH (ref 4.0–10.5)

## 2015-03-29 LAB — CULTURE, ROUTINE-ABSCESS

## 2015-03-29 LAB — GLUCOSE, CAPILLARY
GLUCOSE-CAPILLARY: 143 mg/dL — AB (ref 65–99)
Glucose-Capillary: 242 mg/dL — ABNORMAL HIGH (ref 65–99)

## 2015-03-29 MED ORDER — PANTOPRAZOLE SODIUM 40 MG PO TBEC: 40.0000 mg | DELAYED_RELEASE_TABLET | Freq: Every day | ORAL | Status: DC

## 2015-03-29 MED ORDER — BISACODYL 10 MG RE SUPP
10.0000 mg | Freq: Once | RECTAL | Status: DC
  Filled 2015-03-29: qty 1

## 2015-03-29 MED ORDER — AMOXICILLIN-POT CLAVULANATE 500-125 MG PO TABS: 500.0000 mg | ORAL_TABLET | Freq: Two times a day (BID) | ORAL | Status: DC

## 2015-03-29 MED ORDER — AMOXICILLIN-POT CLAVULANATE 500-125 MG PO TABS
500.0000 mg | ORAL_TABLET | Freq: Two times a day (BID) | ORAL | Status: DC
  Administered 2015-03-29: 500 mg via ORAL
  Filled 2015-03-29: qty 1

## 2015-03-29 MED ORDER — HYDRALAZINE HCL 50 MG PO TABS
75.0000 mg | ORAL_TABLET | Freq: Three times a day (TID) | ORAL | Status: DC
  Administered 2015-03-29: 75 mg via ORAL
  Filled 2015-03-29 (×3): qty 1

## 2015-03-29 MED ORDER — HYDRALAZINE HCL 25 MG PO TABS: 75.0000 mg | ORAL_TABLET | Freq: Three times a day (TID) | ORAL | Status: DC

## 2015-03-29 MED ORDER — POTASSIUM CHLORIDE CRYS ER 20 MEQ PO TBCR
20.0000 meq | EXTENDED_RELEASE_TABLET | Freq: Once | ORAL | Status: AC
  Administered 2015-03-29: 20 meq via ORAL
  Filled 2015-03-29: qty 1

## 2015-03-29 MED ORDER — PANTOPRAZOLE SODIUM 40 MG PO TBEC: 40.0000 mg | DELAYED_RELEASE_TABLET | Freq: Two times a day (BID) | ORAL | Status: DC

## 2015-03-29 MED ORDER — BOOST / RESOURCE BREEZE PO LIQD: 1.0000 | Freq: Three times a day (TID) | ORAL | Status: DC

## 2015-03-29 MED ORDER — POLYETHYLENE GLYCOL 3350 17 G PO PACK: 17.0000 g | PACK | Freq: Two times a day (BID) | ORAL | Status: DC

## 2015-03-29 MED ORDER — HYDROCODONE-ACETAMINOPHEN 5-325 MG PO TABS: 1.0000 | ORAL_TABLET | Freq: Four times a day (QID) | ORAL | Status: DC | PRN

## 2015-03-29 NOTE — Progress Notes (Signed)
03/29/2015 7:33 AM  Patient's BP(systolic) has been ranging in the 170's. Scheduled and PRN Hydralazine given. BP remains in the 170's. MD notified. Report has been passed to oncoming nurse.   Veatrice KellsMahmoud,Zyara Riling I, RN

## 2015-03-29 NOTE — Progress Notes (Signed)
    Subjective:  Doing ok. No CP or dyspnea. Aggravated about his BP - states it's been high his entire life.  Objective:  Vital Signs in the last 24 hours: Temp:  [97.6 F (36.4 C)-98.5 F (36.9 C)] 98.1 F (36.7 C) (05/31 0610) Pulse Rate:  [47-90] 90 (05/31 0610) Resp:  [16-18] 18 (05/31 0610) BP: (160-198)/(30-62) 174/49 mmHg (05/31 0608) SpO2:  [92 %-98 %] 98 % (05/31 0610) Weight:  [162 lb 1.6 oz (73.528 kg)] 162 lb 1.6 oz (73.528 kg) (05/30 2229)  Intake/Output from previous day: 05/30 0701 - 05/31 0700 In: 1860 [P.O.:1710; IV Piggyback:150] Out: 1875 [Urine:1650; Drains:225]  Physical Exam: Pt is alert and oriented, pleasant elderly male in NAD HEENT: normal Neck: JVP - normal Lungs: CTA bilaterally CV: RRR with 2/6 harsh systolic murmur at the LSB Abd: soft, NT Ext: no C/C/E Skin: warm/dry no rash   Lab Results:  Recent Labs  03/27/15 0740 03/28/15 0536  WBC 14.9* 13.3*  HGB 9.3* 9.4*  PLT 170 186    Recent Labs  03/27/15 0740 03/28/15 0536  NA 136 137  K 3.4* 3.6  CL 97* 98*  CO2 25 25  GLUCOSE 236* 157*  BUN 115* 112*  CREATININE 4.37* 4.94*   No results for input(s): TROPONINI in the last 72 hours.  Invalid input(s): CK, MB  Cardiac Studies: 2D Echo: Study Conclusions  - Left ventricle: LV function is hyperdynamic. The cavity size was normal. Wall thickness was increased in a pattern of moderate LVH. The estimated ejection fraction was 75%. Wall motion was normal; there were no regional wall motion abnormalities. - Left atrium: The atrium was mildly dilated. - Right ventricle: The cavity size was normal. Systolic function was normal.  Tele: Sinus rhythm  Assessment/Plan:  1. HTN, uncontrolled 2. CAD, native vessel, without angina 3. AKI on CKD 4 4. Cholecystitis s/p Perc Drain  Pt stable from a cardiac perspective. BP elevated but this is chronic issue. Will increase hydralazine to 75 mg TID. Tele reviewed and shows  episodes of bradycardia would probably be best to avoid beta-blockers. No active cardiac issues please call if any acute issues arise.   Tonny Bollmanooper, Imya Mance, M.D. 03/29/2015, 7:55 AM

## 2015-03-29 NOTE — Discharge Summary (Signed)
, Physician Discharge Summary  [name] XVQ:[id number] DOB: 04/19/1930 DOA: 03/21/2015  PCP: Mayra Neer, MD  Admit date: 03/21/2015 Discharge date: 03/29/2015  Time spent: 35 minutes  Recommendations for Outpatient Follow-up:  1. Need B-met to follow renal function 2. Needs further adjustment of BP medication.  3. Needs to follow up with surgery for cholecystitis S/P cholecystostomy tube placement.   Discharge Diagnoses:    Cholecistitis   Hypertension   Peripheral arterial disease   Renal failure (ARF), acute on chronic   Diabetes mellitus type 2, controlled   Epigastric pain   Elevated liver enzymes   Abdominal pain, acute, right upper quadrant   Elevated troponin   Acute cholecystitis   Abnormal liver function tests   Discharge Condition: stable.   Diet recommendation: renal diet  Filed Weights   03/26/15 2034 03/27/15 2042 03/28/15 2229  Weight: 73.211 kg (161 lb 6.4 oz) 74.072 kg (163 lb 4.8 oz) 73.528 kg (162 lb 1.6 oz)    History of present illness:  [name] is a 81 y.o. male with history of peripheral vascular disease status post left lower extremity stenting, chronic kidney disease stage 3-4, hypertension, hyperlipidemia, asthma presents to the ER because of worsening abdominal pain over the last 4 days. Patient states he developed epigastric pain over the last 4 days which has gradually worsened to the point that he is not able to eat anything and has been having poor appetite. He has had last bowel movement yesterday. In the ER patient had CT abdomen and pelvis which shows cholelithiasis and hiatal hernia with reflux. On exam patient does have epigastric and right upper quadrant tenderness. In addition patient's creatinine has been worsened from baseline. And patient had followed up with his nephrologist yesterday who has discontinued patient's Lasix and Zaroxolyn since patient has lost 30 pounds over the last 6 months. Patient has been admitted for further  management. Patient denies any chest pain though he has epigastric discomfort denies any shortness of breath cough or productive sputum or fever chills.   Hospital Course:  Possible Cholecystitis:  Epigastric abdominal pain, Transaminases;  - Lipase is WNL. - GI has saw the pt in consultation. recommended MRCP -MRCP; Gallstone. No evidence of choledocholithiasis or biliary obstruction.  -hold statin due to elevated LFT. Repeat labs in am./  -HIDA scan consistent with cholecystitis.  -S/P cholecystostomy tube placement 5-28.  -KUB rule negative for ileus.  -Will Resume plavix ok by surgery -started laxatives.  -culture gallbladder: growing klebsiella.  -Patient to be discharge on Augmentin for 3 more days complete 7 days, discussed with surgery -ok to discharge home today, discussed with surgery  Essential hypertension - Continue Norvasc, , hydralazine - Order placed for hydralazine PRN if BP above 150/90 -clonidine on hold due to bradycardia.  -hydralazine increase today to 75 mg TID.   Bradycardia; asymptomatic.  Appreciate cardiology/   Peripheral arterial disease - Continue aspirin , plavix on hold for procedure   Renal failure (ARF), acute on chronic, stage 4 - Creatinine 3.7 on this admission - Recent baseline in 10/2014 was 3.09.  -Continue to hold lasix and zaroxolyn -renal function stable. Appreciate Dr [employer] evaluation.  -cr increasing today 4.3. Good urine out put.  -hold diuretics at discharge. Needs to follow up with Dr Deterding. Needs b-met next week  Leukocytosis; secondary to cholecystitis.  UA negative, chest x ary negative,.  HIDA scan positive for cholecystitis.  Continue to monitor. Patient on zosyn. Discharge on augmentin.  Diabetes mellitus  type 2 with renal and peripheral vascular manifestations - No A1c in EPIC so unclear if DM controlled - hold Tradjenta and continue with SSI  Anemia of chronic disease - Secondary to CKD - No reports  of bleeding   Hypokalemia -replete  prior to discharge  Troponin elevation - Likely demand ischemia from CKD - Cardio consulted - No acute ischemic changes on 12 lead EKG - No complaints of chest pain Dyslipidemia -hold statin due to elevated LFT>    DVT Prophylaxis  - On Lovenox subQ in hospital   Procedures:  S/P cholecystostomy tube.   Consultations:  Surgery  Renal   Discharge Exam: Filed Vitals:   03/29/15 0852  BP: 165/55  Pulse: 93  Temp: 97.7 F (36.5 C)  Resp: 17    General: Alert in no distress.  Cardiovascular: S 1, S 2 RRR Respiratory: CTA Abdomen mild distended , draining in place.   Discharge Instructions   Discharge Instructions    Diet - low sodium heart healthy    Complete by:  As directed      Increase activity slowly    Complete by:  As directed           Current Discharge Medication List    START taking these medications   Details  amoxicillin-clavulanate (AUGMENTIN) 500-125 MG per tablet Take 1 tablet (500 mg total) by mouth 2 times daily at 12 noon and 4 pm. Qty: 6 tablet, Refills: 0    feeding supplement, RESOURCE BREEZE, (RESOURCE BREEZE) LIQD Take 1 Container by mouth 3 (three) times daily between meals. Qty: 30 Container, Refills: 0    HYDROcodone-acetaminophen (NORCO/VICODIN) 5-325 MG per tablet Take 1 tablet by mouth every 6 (six) hours as needed for moderate pain. Qty: 30 tablet, Refills: 0    pantoprazole (PROTONIX) 40 MG tablet Take 1 tablet (40 mg total) by mouth daily. Qty: 30 tablet, Refills: 0    polyethylene glycol (MIRALAX / GLYCOLAX) packet Take 17 g by mouth 2 (two) times daily. Qty: 14 each, Refills: 0      CONTINUE these medications which have CHANGED   Details  hydrALAZINE (APRESOLINE) 25 MG tablet Take 3 tablets (75 mg total) by mouth every 8 (eight) hours. Qty: 60 tablet, Refills: 0      CONTINUE these medications which have NOT CHANGED   Details  allopurinol (ZYLOPRIM) 100 MG tablet TAKE 1  TABLET (100 MG TOTAL) BY MOUTH AT BEDTIME. Qty: 30 tablet, Refills: 2    amLODipine (NORVASC) 10 MG tablet Take 1 tablet by mouth every morning.     aspirin EC 81 MG tablet Take 81 mg by mouth every evening.    calcitRIOL (ROCALTROL) 0.25 MCG capsule Take 0.25 mcg by mouth every morning.     cholecalciferol (VITAMIN D) 1000 UNITS tablet Take 1,000 Units by mouth 2 (two) times daily.     clopidogrel (PLAVIX) 75 MG tablet TAKE 1 TABLET BY MOUTH DAILY Qty: 30 tablet, Refills: 10    Multiple Vitamins-Minerals (PRESERVISION AREDS PO) Take 2 tablets by mouth daily.    sitaGLIPtin (JANUVIA) 100 MG tablet Take 100 mg by mouth every morning. Doctor gave him samples for right now per family member    terazosin (HYTRIN) 10 MG capsule TAKE 1 CAPSULE (10 MG TOTAL) BY MOUTH AT BEDTIME. Qty: 90 capsule, Refills: 2      STOP taking these medications     cloNIDine (CATAPRES) 0.1 MG tablet      furosemide (LASIX) 80 MG tablet  metolazone (ZAROXOLYN) 5 MG tablet      simvastatin (ZOCOR) 20 MG tablet        Allergies  Allergen Reactions  . Labetalol Swelling    angioedema   Follow-up Information    Follow up with Mayra Neer, MD In 1 week.   Specialty:  Family Medicine   Contact information:   [address] [place] [address] [phone]       Follow up with Placido Sou, MD.   Specialty:  Nephrology   Why:  June 24. at [facility].    Contact information:   [address] [place] [address] [phone]       Follow up with Judeth Horn, MD. Schedule an appointment as soon as possible for a visit in 4 weeks.   Specialty:  General Surgery   Contact information:   [address] [place] [address] [phone]        The results of significant diagnostics from this hospitalization (including imaging, microbiology, ancillary and laboratory) are listed below for reference.    Significant Diagnostic Studies: Ct Abdomen Pelvis Wo  Contrast  03/21/2015   CLINICAL DATA:  Epigastric abdominal pain since Saturday.  EXAM: CT ABDOMEN AND PELVIS WITHOUT CONTRAST  TECHNIQUE: Multidetector CT imaging of the abdomen and pelvis was performed following the standard protocol without IV contrast.  COMPARISON:  None.  FINDINGS: Lower chest: The lung bases are clear of acute process. There is a small to moderate-sized hiatal hernia containing contrast. Three-vessel coronary artery calcifications are noted.  Hepatobiliary: No focal hepatic lesions or intrahepatic biliary dilatation. A small gallstone is noted in the gallbladder. No common bile duct dilatation.  Pancreas: Normal.  Spleen: Normal size.  No focal lesions.  Adrenals/Urinary Tract: The adrenal glands are normal. Renal pelvic lipomatosis is noted but no renal calculi or obstructing ureteral calculi. No renal mass. No bladder calculi. Mild bladder distention. The prostate gland is mildly enlarged. The seminal vesicles are normal.  Stomach/Bowel: The stomach, duodenum, small bowel and colon are unremarkable. No inflammatory changes, mass lesions or obstructive findings. The terminal ileum is normal. The appendix is normal.  Vascular/Lymphatic: No mesenteric or retroperitoneal mass or adenopathy. Small scattered lymph nodes are noted. There are severe aortic and branch vessel calcifications but no focal aneurysm.  Other: No pelvic mass or adenopathy. No free pelvic fluid collections. No inguinal mass or adenopathy.  Musculoskeletal: No significant bony findings. Moderate degenerative changes involving the spine.  IMPRESSION: 1. Severe atherosclerotic calcifications involving the aorta and branch vessels. 2. Moderate-sized hiatal hernia with reflux of oral contrast into the distal esophagus. 3. Cholelithiasis. 4. No acute abdominal/pelvic findings, mass lesions or adenopathy.   Electronically Signed   By: Marijo Sanes M.D.   On: 03/21/2015 19:07   Dg Chest 2 View  03/21/2015   CLINICAL DATA:  SOB  all the time, abdominal pain with weakness x 2 days. Hx of COPD, HTN, diabetes.  EXAM: CHEST  2 VIEW  COMPARISON:  08/27/2012  FINDINGS: The heart size and mediastinal contours are within normal limits. Mild stable scarring at the lung apices. Lungs otherwise clear. No pleural effusion or pneumothorax. The visualized skeletal structures show no acute findings.  IMPRESSION: No active cardiopulmonary disease.   Electronically Signed   By: Lajean Manes M.D.   On: 03/21/2015 16:51   Dg Abd 1 View  03/27/2015   CLINICAL DATA:  Constipation and lower abdominal pain.  EXAM: ABDOMEN - 1 VIEW  COMPARISON:  03/22/2015  FINDINGS: Right upper quadrant percutaneous cholecystostomy tube is identified. Enteric contrast material is identified within the right side of colon. No dilated loops of small bowel identified.  IMPRESSION: 1. Nonobstructive bowel gas pattern. 2. Cholecystostomy tube noted within the right upper quadrant the abdomen.   Electronically Signed   By: Kerby Moors M.D.   On: 03/27/2015 15:12   Nm Hepatobiliary Including Gb  03/23/2015   CLINICAL DATA:  Epigastric abdominal pain over the past 5 days.  EXAM: NUCLEAR MEDICINE HEPATOBILIARY IMAGING  TECHNIQUE: Sequential images of the abdomen were obtained out to 60 minutes following intravenous administration of radiopharmaceutical. At this point, 2.9 mg of intravenous morphine were administered and imaging was carried out an additional 30 min.  RADIOPHARMACEUTICALS:  5.2 mCi Tc-52mCholetec IV  COMPARISON:  No prior nuclear imaging. MRCP yesterday. CT abdomen and pelvis 03/21/2015.  FINDINGS: Hepatic uptake of Choletec is normal. Intra and extrahepatic bile ducts are not visible until approximately 20 min. Duodenal activity is visible at 25 min. At no point during the original 60 minutes of imaging did the gallbladder become visible. There is evidence of tracer retention by the liver.  The gallbladder was not visualized after morphine administration. Reflux  of activity into the stomach did occur.  IMPRESSION: 1. Cystic duct obstruction consistent with acute cholecystitis, despite the negative imaging studies thus far. The gallbladder was not visualized even after administration of intravenous morphine. 2. Cholestasis, with tracer retention by the liver. This is consistent with hepatocellular disease as there is no evidence of common bile duct obstruction. Please correlate with liver function tests. These results will be called to the ordering clinician or representative by the Radiologist Assistant, and communication documented in the PACS or zVision Dashboard.   Electronically Signed   By: TEvangeline DakinM.D.   On: 03/23/2015 14:50   Mr Abdomen Mrcp Wo Cm  03/22/2015   CLINICAL DATA:  Abnormal liver function tests.  Gallstones.  EXAM: MRI ABDOMEN WITHOUT CONTRAST  (INCLUDING MRCP)  TECHNIQUE: Multiplanar multisequence MR imaging of the abdomen was performed. Heavily T2-weighted images of the biliary and pancreatic ducts were obtained, and three-dimensional MRCP images were rendered by post processing.  COMPARISON:  03/21/2015  FINDINGS: Lower chest: There is no pleural effusion identified. No pericardial effusion.  Hepatobiliary: No focal liver abnormality identified. There is diffuse low signal on the T1 and T2 weighted sequences suggesting diffuse iron deposition. Small stone within the dependent portion of the gallbladder measures 5 mm. No gallbladder wall thickening or pericholecystic fluid. No biliary dilatation or evidence of choledocholithiasis.  Pancreas: There is no focal pancreas abnormality.  Spleen: No focal spleen abnormality.  Adrenals/Urinary Tract: The adrenal glands are both normal. There is bilateral and symmetric renal cortical thinning. No obstructive uropathy or mass noted.  Stomach/Bowel: The stomach and the small bowel loops are on unremarkable. The visualized portions of the colon are within normal limits.  Vascular/Lymphatic: Normal  appearance of the abdominal aorta. No upper abdominal adenopathy noted.  Other: Insert ascites stress set no free fluid or fluid collections identified.  Musculoskeletal: There is diffuse low signal throughout the bone marrow.  IMPRESSION: 1. No acute findings. 2. Gallstone. No evidence for choledocholithiasis or biliary obstruction. 3. Diffuse low signal within the liver, spleen and bone marrow which may be seen with abnormal iron deposition.   Electronically Signed   By: TKerby MoorsM.D.   On: 03/22/2015 15:43   Mr 3d Recon At Scanner  03/22/2015  CLINICAL DATA:  Abnormal liver function tests.  Gallstones.  EXAM: MRI ABDOMEN WITHOUT CONTRAST  (INCLUDING MRCP)  TECHNIQUE: Multiplanar multisequence MR imaging of the abdomen was performed. Heavily T2-weighted images of the biliary and pancreatic ducts were obtained, and three-dimensional MRCP images were rendered by post processing.  COMPARISON:  03/21/2015  FINDINGS: Lower chest: There is no pleural effusion identified. No pericardial effusion.  Hepatobiliary: No focal liver abnormality identified. There is diffuse low signal on the T1 and T2 weighted sequences suggesting diffuse iron deposition. Small stone within the dependent portion of the gallbladder measures 5 mm. No gallbladder wall thickening or pericholecystic fluid. No biliary dilatation or evidence of choledocholithiasis.  Pancreas: There is no focal pancreas abnormality.  Spleen: No focal spleen abnormality.  Adrenals/Urinary Tract: The adrenal glands are both normal. There is bilateral and symmetric renal cortical thinning. No obstructive uropathy or mass noted.  Stomach/Bowel: The stomach and the small bowel loops are on unremarkable. The visualized portions of the colon are within normal limits.  Vascular/Lymphatic: Normal appearance of the abdominal aorta. No upper abdominal adenopathy noted.  Other: Insert ascites stress set no free fluid or fluid collections identified.  Musculoskeletal:  There is diffuse low signal throughout the bone marrow.  IMPRESSION: 1. No acute findings. 2. Gallstone. No evidence for choledocholithiasis or biliary obstruction. 3. Diffuse low signal within the liver, spleen and bone marrow which may be seen with abnormal iron deposition.   Electronically Signed   By: Kerby Moors M.D.   On: 03/22/2015 15:43   Ir Perc Cholecystostomy  03/26/2015   CLINICAL DATA:  Right upper abdominal pain. Cholelithiasis and pericholecystic inflammatory changes on MR. Abnormal hepatobiliary scintigraphy indicating cystic duct obstruction.  EXAM: PERCUTANEOUS CHOLECYSTOSTOMY TUBE PLACEMENT WITH ULTRASOUND AND FLUOROSCOPIC GUIDANCE:  FLUOROSCOPY TIME:  30 seconds, 19 mGy  TECHNIQUE: The procedure, risks (including but not limited to bleeding, infection, organ damage ), benefits, and alternatives were explained to the patient. Questions regarding the procedure were encouraged and answered. The patient understands and consents to the procedure. Survey ultrasound of the abdomen was performed and an appropriate skin entry site was identified. Skin site was marked, prepped with Betadine, and draped in usual sterile fashion, and infiltrated locally with 1% lidocaine.  Intravenous Fentanyl and Versed were administered as conscious sedation during continuous cardiorespiratory monitoring by the radiology RN, with a total moderate sedation time of 6 minutes.  Under real-time ultrasound guidance, gallbladder was accessed using a transhepatic approach with a 21-gauge needle. Ultrasound image documentation was saved. Bile returned through the hub. Needle was exchanged over a 018 guidewire for transitional dilator which allowed placement of 035 J wire. Over this, a 10.2 French pigtail catheter was advanced and formed centrally in the gallbladder lumen. Small contrast injection confirmed appropriate position. Catheter secured externally with 0 Prolene suture and placed external drain bag. Patient tolerated  the procedure well.  COMPLICATIONS: COMPLICATIONS none  IMPRESSION: 1. Technically successful percutaneous cholecystostomy tube placement with ultrasound and fluoroscopic guidance.   Electronically Signed   By: Lucrezia Europe M.D.   On: 03/26/2015 13:54    Microbiology: Recent Results (from the past 240 hour(s))  MRSA PCR Screening     Status: None   Collection Time: 03/25/15  5:00 AM  Result Value Ref Range Status   MRSA by PCR NEGATIVE NEGATIVE Final    Comment:        The GeneXpert MRSA Assay (FDA approved for NASAL specimens only), is one component of a comprehensive MRSA colonization  surveillance program. It is not intended to diagnose MRSA infection nor to guide or monitor treatment for MRSA infections.   Culture, routine-abscess     Status: None   Collection Time: 03/26/15 12:19 PM  Result Value Ref Range Status   Specimen Description ABSCESS  Final   Special Requests GALLBALL  Final   Gram Stain   Final    RARE WBC PRESENT, PREDOMINANTLY MONONUCLEAR NO SQUAMOUS EPITHELIAL CELLS SEEN ABUNDANT GRAM POSITIVE RODS FEW GRAM NEGATIVE RODS Performed at [facility]    Culture   Final    ABUNDANT KLEBSIELLA PNEUMONIAE Performed at [facility]    Report Status 03/29/2015 FINAL  Final   Organism ID, Bacteria KLEBSIELLA PNEUMONIAE  Final      Susceptibility   Klebsiella pneumoniae - MIC*    AMPICILLIN >=32 RESISTANT Resistant     AMPICILLIN/SULBACTAM 4 SENSITIVE Sensitive     CEFAZOLIN <=4 SENSITIVE Sensitive     CEFEPIME <=1 SENSITIVE Sensitive     CEFTAZIDIME <=1 SENSITIVE Sensitive     CEFTRIAXONE <=1 SENSITIVE Sensitive     CIPROFLOXACIN <=0.25 SENSITIVE Sensitive     GENTAMICIN <=1 SENSITIVE Sensitive     IMIPENEM <=0.25 SENSITIVE Sensitive     PIP/TAZO <=4 SENSITIVE Sensitive     TOBRAMYCIN <=1 SENSITIVE Sensitive     TRIMETH/SULFA <=20 SENSITIVE Sensitive     * ABUNDANT KLEBSIELLA PNEUMONIAE     Labs: Basic Metabolic Panel:  Recent Labs Lab  03/25/15 1714 03/26/15 0431 03/27/15 0740 03/28/15 0536 03/29/15 0905  NA 140 137 136 137 136  K 3.2* 3.5 3.4* 3.6 3.4*  CL 100* 97* 97* 98* 97*  CO2 _0 GLUCOSE 100* 96 236* 157* 233*  BUN 119* 113* 115* 112* 107*  CREATININE 3.85* 3.72* 4.37* 4.94* 4.66*  CALCIUM 8.6* 8.5* 8.4* 8.8* 8.9   Liver Function Tests:  Recent Labs Lab 03/24/15 0531 03/25/15 0612 03/28/15 0536  AST 43* 32 27  ALT 104* 73* 52  ALKPHOS 193* 201* 146*  BILITOT 0.9 0.8 0.8  PROT 5.3* 5.6* 5.9*  ALBUMIN 2.5* 2.3* 2.3*   No results for input(s): LIPASE, AMYLASE in the last 168 hours. No results for input(s): AMMONIA in the last 168 hours. CBC:  Recent Labs Lab 03/25/15 0612 03/26/15 0431 03/27/15 0740 03/28/15 0536 03/29/15 0905  WBC 12.2* 8.7 14.9* 13.3* 10.6*  HGB 9.4* 9.5* 9.3* 9.4* 9.8*  HCT 29.3* 29.2* 28.6* 29.4* 30.0*  MCV 85.4 85.4 85.4 85.0 84.5  PLT 183 182 170 186 198   Cardiac Enzymes: No results for input(s): CKTOTAL, CKMB, CKMBINDEX, TROPONINI in the last 168 hours. BNP: BNP (last 3 results) No results for input(s): BNP in the last 8760 hours.  ProBNP (last 3 results) No results for input(s): PROBNP in the last 8760 hours.  CBG:  Recent Labs Lab 03/28/15 0732 03/28/15 1115 03/28/15 1644 03/28/15 2039 03/29/15 0730  GLUCAP 164* 223* 147* 147* 143*       Signed:  Candelaria Pies A  [facility] 03/29/2015, 11:11 AM

## 2015-03-29 NOTE — Progress Notes (Signed)
Patient ID: [name], male   DOB: 07/11/1930, 81 y.o.   MRN: [id number]    Subjective: Feeling well.  Up walking around the room.  Wants solid food.  No nausea.  No abdominal pain  Objective: Vital signs in last 24 hours: Temp:  [97.7 F (36.5 C)-98.5 F (36.9 C)] 97.7 F (36.5 C) (05/31 0852) Pulse Rate:  [47-93] 93 (05/31 0852) Resp:  [16-18] 17 (05/31 0852) BP: (160-198)/(30-62) 165/55 mmHg (05/31 0852) SpO2:  [93 %-98 %] 93 % (05/31 0852) Weight:  [73.528 kg (162 lb 1.6 oz)] 73.528 kg (162 lb 1.6 oz) (05/30 2229) Last BM Date: 03/21/15  Intake/Output from previous day: 05/30 0701 - 05/31 0700 In: 1860 [P.O.:1710; IV Piggyback:150] Out: 1875 [Urine:1650; Drains:225] Intake/Output this shift: Total I/O In: 360 [P.O.:360] Out: 200 [Urine:200]  PE: Abd: soft, NT, except mildly around his drain, drain with bilious output, +BS, ND  Lab Results:   Recent Labs  03/27/15 0740 03/28/15 0536  WBC 14.9* 13.3*  HGB 9.3* 9.4*  HCT 28.6* 29.4*  PLT 170 186   BMET  Recent Labs  03/27/15 0740 03/28/15 0536  NA 136 137  K 3.4* 3.6  CL 97* 98*  CO2 25 25  GLUCOSE 236* 157*  BUN 115* 112*  CREATININE 4.37* 4.94*  CALCIUM 8.4* 8.8*   PT/INR No results for input(s): LABPROT, INR in the last 72 hours. CMP     Component Value Date/Time   NA 137 03/28/2015 0536   K 3.6 03/28/2015 0536   CL 98* 03/28/2015 0536   CO2 25 03/28/2015 0536   GLUCOSE 157* 03/28/2015 0536   BUN 112* 03/28/2015 0536   CREATININE 4.94* 03/28/2015 0536   CALCIUM 8.8* 03/28/2015 0536   PROT 5.9* 03/28/2015 0536   ALBUMIN 2.3* 03/28/2015 0536   AST 27 03/28/2015 0536   ALT 52 03/28/2015 0536   ALKPHOS 146* 03/28/2015 0536   BILITOT 0.8 03/28/2015 0536   GFRNONAA 10* 03/28/2015 0536   GFRAA 11* 03/28/2015 0536   Lipase     Component Value Date/Time   LIPASE 25 03/22/2015 0422       Studies/Results: Dg Abd 1 View  03/27/2015   CLINICAL DATA:  Constipation and lower abdominal  pain.  EXAM: ABDOMEN - 1 VIEW  COMPARISON:  03/22/2015  FINDINGS: Right upper quadrant percutaneous cholecystostomy tube is identified. Enteric contrast material is identified within the right side of colon. No dilated loops of small bowel identified.  IMPRESSION: 1. Nonobstructive bowel gas pattern. 2. Cholecystostomy tube noted within the right upper quadrant the abdomen.   Electronically Signed   By: Signa Kell M.D.   On: 03/27/2015 15:12    Anti-infectives: Anti-infectives    Start     Dose/Rate Route Frequency Ordered Stop   03/24/15 0730  piperacillin-tazobactam (ZOSYN) IVPB 2.25 g     2.25 g 100 mL/hr over 30 Minutes Intravenous 3 times per day 03/24/15 0725     03/23/15 1930  ampicillin-sulbactam (UNASYN) 1.5 g in sodium chloride 0.9 % 50 mL IVPB  Status:  Discontinued     1.5 g 100 mL/hr over 30 Minutes Intravenous Every 12 hours 03/23/15 1844 03/24/15 1191       Assessment/Plan  Cholelithiasis possible cholecystitis  -s/p Percutaneous cholecystostomy drain placed by IR on 03/26/15 Dr. Deanne Coffer -low fat diet today -continue routine drain care Leukocytosis -on zosyn, D6/7 (?) CKD Stage IV with Nephrotic syndrome Hypertension AODM Bradycardia   LOS: 8 days    Morelia Cassells E  03/29/2015, 9:17 AM Pager: [phone]

## 2015-03-29 NOTE — Discharge Instructions (Signed)
Cholecystostomy  °The gallbladder is a pear-shaped organ that lies beneath the liver on the right side of the body. The gallbladder stores bile, a fluid that helps the body digest fats. However, sometimes bile and other fluids build up in the gallbladder because of an obstruction (for example, a gallstones). This can cause fever, pain, swelling, nausea and other serious symptoms. °The procedure used to drain these fluids is called a cholecystostomy. A tube is inserted into the gallbladder. Fluid drains through the tube into a plastic bag outside the body. This procedure is usually done on people who are admitted to the hospital. °The procedure is often recommended for people who cannot have gallbladder surgery right away, usually because they are too ill to make it through surgery. The cholecystostomy tube is usually temporary, until surgery can be done. °RISKS AND COMPLICATIONS °Although rare, complications can include: °· Clogging of the tube. °· Infection in or around the drain site. Antibiotics might be prescribed for the infection. Or, another tube might be inserted to drain the infected fluid. °· Internal bleeding from the liver. °BEFORE THE PROCEDURE  °· Try to quit smoking several weeks before the procedure. Smoking can slow healing. °· Arrange for someone to drive you home from the hospital. °· Right before your procedure, avoid all foods and liquids after midnight. This includes coffee, tea and water. °· On the day of the procedure, arrive early to fill out all the paperwork. °PROCEDURE °You will be given a sedative to make you sleepy and a local anesthetic to numb the skin. Next, a small cut is made in the abdomen. Then a tube is threaded through the cut into the gallbladder. The procedure is usually done with ultrasound to guide the tube into the gallbladder. Once the tube is in place, the drain is secured to the skin with a stitch. The tube is then connected to a drainage bag.  °AFTER THE PROCEDURE   °· People who have a cholecystostomy usually stay in the hospital for several days because they are so ill. You might not be able to eat for the first few days. Instead, you will be connected to an IV for fluids and nutrients. °· The procedure does not cure the blockage that caused the fluid to build up in the first place. Because of this, the gallbladder will need to be removed in the future. The drain is removed at that time. °HOME CARE INSTRUCTIONS  °Be sure to follow your healthcare provider's instructions carefully. You may shower but avoid tub baths and swimming until your caregiver says it is OK. Eat and drink according to the directions you have been given. And be sure to make all follow-up appointments.  °Call your healthcare provider if you notice new pain, redness or swelling around the wound. °SEEK IMMEDIATE MEDICAL CARE IF:  °· There is increased abdominal pain. °· Nausea or vomiting occurs. °· You develop a fever. °· The drainage tube comes out of the abdomen. °Document Released: 01/11/2009 Document Revised: 01/07/2012 Document Reviewed: 01/11/2009 °Exit[employer]® Patient Information ©2015 [employer]. This information is not intended to replace advice given to you by your health care provider. Make sure you discuss any questions you have with your health care provider. ° °

## 2015-04-26 ENCOUNTER — Other Ambulatory Visit: Payer: Self-pay

## 2015-04-26 DIAGNOSIS — K8020 Calculus of gallbladder without cholecystitis without obstruction: Secondary | ICD-10-CM

## 2015-04-26 NOTE — Addendum Note (Signed)
Addended by: Jimmye NormanWYATT, Shereese Bonnie on: 04/26/2015 11:32 AM   Modules accepted: Orders

## 2015-04-27 ENCOUNTER — Other Ambulatory Visit: Payer: Self-pay | Admitting: Physician Assistant

## 2015-04-27 ENCOUNTER — Telehealth: Payer: Self-pay

## 2015-04-27 DIAGNOSIS — Z01810 Encounter for preprocedural cardiovascular examination: Secondary | ICD-10-CM

## 2015-04-27 NOTE — Telephone Encounter (Signed)
Request for surgical clearance: 1. What type of surgery is being performed? cholecystectomy 2. When is this surgery scheduled? pending 3. Are there any medications that need to be held prior to surgery and how long? Not requested by surgeon but, patient is on Aspirin 81 and Plavix 75 4. Name of physician performing surgery? [facility] 5. What is the office phone and fax number? Phone [phone]  Fax [phone]

## 2015-04-28 ENCOUNTER — Encounter (HOSPITAL_COMMUNITY)
Admission: RE | Admit: 2015-04-28 | Discharge: 2015-04-28 | Disposition: A | Discharge status: Home / Self Care | Payer: Medicare Other | Source: Ambulatory Visit | Attending: Nephrology | Admitting: Nephrology

## 2015-04-28 ENCOUNTER — Other Ambulatory Visit: Payer: Self-pay | Admitting: General Surgery

## 2015-04-28 ENCOUNTER — Other Ambulatory Visit: Payer: Self-pay | Admitting: Interventional Radiology

## 2015-04-28 DIAGNOSIS — K8020 Calculus of gallbladder without cholecystitis without obstruction: Secondary | ICD-10-CM

## 2015-04-28 DIAGNOSIS — D631 Anemia in chronic kidney disease: Secondary | ICD-10-CM | POA: Diagnosis present

## 2015-04-28 DIAGNOSIS — N183 Chronic kidney disease, stage 3 (moderate): Secondary | ICD-10-CM | POA: Insufficient documentation

## 2015-04-28 LAB — IRON AND TIBC
Iron: 102 ug/dL (ref 45–182)
SATURATION RATIOS: 39 % (ref 17.9–39.5)
TIBC: 259 ug/dL (ref 250–450)
UIBC: 157 ug/dL

## 2015-04-28 LAB — FERRITIN: Ferritin: 262 ng/mL (ref 24–336)

## 2015-04-28 LAB — POCT HEMOGLOBIN-HEMACUE: HEMOGLOBIN: 9.3 g/dL — AB (ref 13.0–17.0)

## 2015-04-28 MED ORDER — EPOETIN ALFA 20000 UNIT/ML IJ SOLN
INTRAMUSCULAR | Status: AC
  Administered 2015-04-28: 20000 [IU]
  Filled 2015-04-28: qty 1

## 2015-04-28 MED ORDER — EPOETIN ALFA 40000 UNIT/ML IJ SOLN: 30000.0000 [IU] | INTRAMUSCULAR | Status: DC

## 2015-04-28 MED ORDER — EPOETIN ALFA 10000 UNIT/ML IJ SOLN
INTRAMUSCULAR | Status: AC
  Administered 2015-04-28: 10000 [IU]
  Filled 2015-04-28: qty 1

## 2015-04-29 NOTE — Telephone Encounter (Signed)
Pharmacologic Myoview stress test to risk stratify him prior to his elective cholecystectomy. He can stop his aspirin and Plavix prior to the procedure.

## 2015-05-03 ENCOUNTER — Encounter: Payer: Self-pay | Admitting: Endocrinology

## 2015-05-03 ENCOUNTER — Ambulatory Visit (INDEPENDENT_AMBULATORY_CARE_PROVIDER_SITE_OTHER): Payer: Medicare Other | Admitting: Endocrinology

## 2015-05-03 VITALS — BP 152/32 | HR 61 | Temp 98.0°F | Resp 16 | Ht 66.0 in | Wt 160.2 lb

## 2015-05-03 DIAGNOSIS — E1165 Type 2 diabetes mellitus with hyperglycemia: Secondary | ICD-10-CM | POA: Diagnosis not present

## 2015-05-03 DIAGNOSIS — IMO0002 Reserved for concepts with insufficient information to code with codable children: Secondary | ICD-10-CM

## 2015-05-03 MED ORDER — SITAGLIPTIN PHOSPHATE 25 MG PO TABS: 25.0000 mg | ORAL_TABLET | Freq: Every day | ORAL | Status: DC

## 2015-05-03 NOTE — Patient Instructions (Addendum)
Check blood sugars on waking up .Marland Kitchen. 2-3 .Marland Kitchen. times a week Also check blood sugars about 2 hours after a meal and do this after different meals by rotation  Recommended blood sugar levels on waking up is 90-130 and about 2 hours after meal is 140-180 Please bring blood sugar monitor to each visit.  Low fat meals, avoid fast food except grilled  Januvia 25 mg daily  Walk daily when able to

## 2015-05-03 NOTE — Progress Notes (Signed)
Patient ID: [name], male   DOB: 09/14/1930, 81 y.o.   MRN: [id number]           Reason for Appointment: Consultation for Type 2 Diabetes  Referring physician: Cam HaiKimberly Shaw   History of Present Illness:          Date of diagnosis of type 2 diabetes mellitus: About 2011       Background history:  She was diagnosed to have diabetes by the primary care physician but details are not available He was also being evaluated at that time for renal dysfunction and poorly controlled blood pressure He may have been given metformin initially but this was stopped about 4 years ago Most likely has been on Januvia and no other medications in the past for his diabetes, he does not remember details  Recent history:   He has been taking Januvia, mostly with samples over the last few months but again details are not available about his treatment or prior level of control Currently the patient is taking his blood sugar with a meter that he is borrowing from somebody else and does not keep a record Recently during his admission for cholecystitis he had some readings over 200 on his blood sugar and his nephrologist has referred him for further management       Current blood sugar patterns and problems identified:  Patient is taking his blood sugars mostly about noontime and they are generally below 150  He does not check blood sugars any other time  He is taking Januvia 100 mg daily with samples and has not been told to reduce the dose for his renal function     Oral hypoglycemic drugs the patient is taking are:    Januvia 100 mg daily    Side effects from medications have been: None  Compliance with the medical regimen: Fair Hypoglycemia: Never    Glucose monitoring:  done one time a day         Glucometer: ?  Accu-Chek       Blood Glucose readings by time of day and averages from meter download:  PREMEAL Breakfast Lunch Dinner Bedtime  Overall   Glucose range:  130-200     Median:            Self-care: The diet that the patient has been following is: None    Typical meal intake: Breakfast is  cereal or some kind of biscuit, usually eats at fast food restaurants once a day.  Tries to have more vegetables at dinner time His snacks will be with fruit or oatmeal cookies        Dietician visit, most recent: 2011               Exercise:  none recently  Weight history: His weight has been variable based on his degree of fluid retention  Wt Readings from Last 3 Encounters:  05/03/15 160 lb 3.2 oz (72.666 kg)  03/28/15 162 lb 1.6 oz (73.528 kg)  03/16/15 157 lb (71.215 kg)    Glycemic control:   Lab Results  Component Value Date   HGBA1C 7.2* 03/23/2015   HGBA1C 7.4* 03/22/2015   Lab Results  Component Value Date   LDLCALC 75 06/22/2014   CREATININE 4.66* 03/29/2015         Medication List       This list is accurate as of: 05/03/15  8:53 PM.  Always use your most recent med list.  allopurinol 100 MG tablet  Commonly known as:  ZYLOPRIM  TAKE 1 TABLET (100 MG TOTAL) BY MOUTH AT BEDTIME.     amLODipine 10 MG tablet  Commonly known as:  NORVASC  Take 1 tablet by mouth every morning.     aspirin EC 81 MG tablet  Take 81 mg by mouth every evening.     calcitRIOL 0.25 MCG capsule  Commonly known as:  ROCALTROL  Take 0.25 mcg by mouth every morning.     cholecalciferol 1000 UNITS tablet  Commonly known as:  VITAMIN D  Take 1,000 Units by mouth 2 (two) times daily.     clopidogrel 75 MG tablet  Commonly known as:  PLAVIX  TAKE 1 TABLET BY MOUTH DAILY     feeding supplement (RESOURCE BREEZE) Liqd  Take 1 Container by mouth 3 (three) times daily between meals.     hydrALAZINE 25 MG tablet  Commonly known as:  APRESOLINE  Take 3 tablets (75 mg total) by mouth every 8 (eight) hours.     pantoprazole 40 MG tablet  Commonly known as:  PROTONIX  Take 1 tablet (40 mg total) by mouth daily.     polyethylene glycol packet  Commonly known  as:  MIRALAX / GLYCOLAX  Take 17 g by mouth 2 (two) times daily.     PRESERVISION AREDS PO  Take 2 tablets by mouth daily.     sitaGLIPtin 25 MG tablet  Commonly known as:  JANUVIA  Take 1 tablet (25 mg total) by mouth daily.     terazosin 10 MG capsule  Commonly known as:  HYTRIN  TAKE 1 CAPSULE (10 MG TOTAL) BY MOUTH AT BEDTIME.        Allergies:  Allergies  Allergen Reactions  . Labetalol Swelling    angioedema    Past Medical History  Diagnosis Date  . Diabetes mellitus   . Hypertension   . Hypercholesteremia   . Gout   . PVD (peripheral vascular disease) 7/05    LCE, known 60% RICA 7/12  . CAD (coronary artery disease) 2002    non critical  . Macular degeneration   . Chronic renal disease, stage III   . Claudication   . Palpitations     PVCs and PSVT on Holter monitoring  . Bilateral carotid artery disease     status post left carotid endarterectomy performed by Dr. Madilyn Fireman 05/12/04  . Anemia     Past Surgical History  Procedure Laterality Date  . 2d echocardiogram  03/31/2008    EF greater than 55%  . Cardiovascular stress test  06/30/2010    Nonischemic. Low risk  . Cerebral angiogram  04/03/2004    High-grade 80% ostial L ICA stenosis. Medical management.  . Abdominal aortogram  09/01/2007    Widely patent renal arteries. Medical management.  . Peripheral vascular angiogram  05/07/2011    No evidence of intracranial occlusions, stenosis, dissections, or aneurysms seen  . Carotid doppler  06/09/2012    R Vertebral-known occluded vessel, R Bulb/Proximal ICA-moderate to severe amount of plaque w/50-69% diameter reduction, L Subclavian-50-69% diameter reduction, L CEA-demonstrated increased velocities w/o evidence of hemodynamically significant stenosis  . Cardiac catheterization  10/17/2001    No significant CAD, normal LV systolic function.  . Carotid endarterectomy Left July 2005  . Lower extremity angiogram Bilateral 11/01/2014    Procedure: LOWER EXTREMITY  ANGIOGRAM;  Surgeon: [name], MD;  Location: [facility] CATH LAB;  Service: Cardiovascular;  Laterality: Bilateral;  .  Abdominal angiogram  11/01/2014    Procedure: ABDOMINAL ANGIOGRAM;  Surgeon: [name], MD;  Location: [facility] CATH LAB;  Service: Cardiovascular;;  . Knee surgery    . Hernia repair      Family History  Problem Relation Age of Onset  . Stroke Sister   . Heart disease Brother   . Diabetes Neg Hx     Social History:  reports that he has quit smoking. He has never used smokeless tobacco. He reports that he does not drink alcohol. His drug history is not on file.    Review of Systems    Lipid history: He has not been on any medications for lipids    Lab Results  Component Value Date   CHOL 139 06/22/2014   HDL 41 06/22/2014   LDLCALC 75 06/22/2014   TRIG 116 06/22/2014   CHOLHDL 3.4 06/22/2014           Constitutional: no recent weight gain/loss, no complaints of unusual fatigue   Eyes: no history of blurred vision.  Most recent eye exam was 11/15 with Dr Jerolyn Center, unknown findings  ENT: no nasal congestion, difficulty swallowing, no hoarseness   Cardiovascular: no chest pain or tightness on exertion.   No leg swelling recently, has been on 160 mg Lasix 3 times a day from nephrologist.   RENAL insufficiency: Last GFR is 12. This is apparently from hypertensive kidney disease and has had complete locations including anemia, secondary hyperparathyroidism managed with Procrit and calcitriol respectively  Lab Results  Component Value Date   CREATININE 4.66* 03/29/2015   BUN 107* 03/29/2015   NA 136 03/29/2015   K 3.4* 03/29/2015   CL 97* 03/29/2015   CO2 25 03/29/2015    Hypertension: Has had this for at least 30-40 years and has been difficult to control.  Followed by nephrologist.  He thinks blood pressure is recently better.  Currently on amlodipine, clonidine, hydralazine and Hytrin  Respiratory: no cough/shortness of breath  Gastrointestinal:  no constipation, diarrhea, nausea  He has been followed to have gallbladder disease and since his cholecystectomy could not be done because of medical issues he has had biliary drainage in place, pending follow-up with surgeon  Musculoskeletal: no muscle/joint aches   Urological:   No frequency of urination or  nocturia  Skin: no rash or infections  Neurological: no headaches.  Has no numbness, burning, pains or tingling in feet.  May have coldness in his feet. He has had issues with memory    PVD: Present, treated with vascular intervention Cardiovascular disease: He has 60% right carotid stenosis  Psychiatric: no symptoms of depression  Endocrine: No unusual fatigue, cold intolerance or history of thyroid disease   Physical Examination:  BP 152/32 mmHg  Pulse 61  Temp(Src) 98 F (36.7 C)  Resp 16  Ht 5\' 6"  (1.676 m)  Wt 160 lb 3.2 oz (72.666 kg)  BMI 25.87 kg/m2  SpO2 96%  GENERAL:  he is averagely built and nourished, mild abdominal obesity       HEENT:         Eye exam shows normal external appearance. Fundus exam shows no retinopathy.  Oral exam shows normal mucosa .  NECK:   There is no lymphadenopathy Thyroid is not enlarged and no nodules felt.  Carotids are normal to palpation and bilateral bruits heard LUNGS:         Chest is symmetrical. Lungs are clear to auscultation.Marland Kitchen   HEART:  Heart sounds:  S1 and S2 are normal. No murmurs or clicks heard., no S3 or S4.   ABDOMEN:   There is mild distention present.  Bandage with biliary tube on the right side present Liver and spleen are not palpable. No other mass or tenderness present.   NEUROLOGICAL:   Vibration sense is mildly reduced in distal first toes. Ankle jerks are absent bilaterally.          Diabetic foot exam shows normal monofilament sensation in the toes and plantar surfaces, no skin lesions or ulcers on the feet and normal pedal pulses MUSCULOSKELETAL:  There is no swelling or deformity of the  peripheral joints. Spine is normal to inspection.   EXTREMITIES:     There is no edema. No skin lesions present.Marland Kitchen SKIN:       No rash or lesions of concern.        ASSESSMENT:  Diabetes type 2 with BMI 26 He appears to have fairly good blood sugar control with his A1c just over 7% recently in 02/2015   This may be adequate considering his age and multiple medical problems.  Currently his management is only with Januvia; also discussed with patient that because of his renal failure he needs to be taking a reduced dose of the drug He appears to be not watching his diet and has little instructions regarding meal planning, generally eating high-fat meals or unbalanced food such as cereal at breakfast and fruits and cookies for snacks Recently unable to exercise because of his continued gallbladder issues      Complications: None evident.  Reportedly his renal dysfunction is related to hypertension. Also no report of his recent eye exam available Currently does have evidence of significant vascular disease with history of PVD and carotid bruits  Multiple other medical problems as listed above and in problem list/past history  PLAN:   He will take Januvia 25 mg daily which is the dose indicated for a GFR of 12.  Although Tradjenta would be preferred because of his renal dysfunction this is not as well covered on his insurance  Discussed needing to check his blood sugars more consistently at various times of the day to help identify postprandial patterns.  He was given an Accu-Chek [employer] meter today which he will bring for download on each visit  He will try to avoid high-fat fast foods especially with his history of significant edema and need for sodium restriction  Consultation with dietitian for details meal planning  Resume exercise when able to  Follow-up in 4 weeks  Follow-up with other physicians for management of multiple problems including lipid management, vascular disease and  hypertension  Patient Instructions  Check blood sugars on waking up .Marland Kitchen 2-3 .Marland Kitchen times a week Also check blood sugars about 2 hours after a meal and do this after different meals by rotation  Recommended blood sugar levels on waking up is 90-130 and about 2 hours after meal is 140-180 Please bring blood sugar monitor to each visit.  Low fat meals, avoid fast food except grilled  Januvia 25 mg daily  Walk daily when able to    [facility] 05/03/2015, 8:53 PM   Note: This office note was prepared with Insurance underwriter. Any transcriptional errors that result from this process are unintentional.

## 2015-05-04 ENCOUNTER — Encounter (HOSPITAL_COMMUNITY)
Admission: RE | Admit: 2015-05-04 | Discharge: 2015-05-04 | Disposition: A | Discharge status: Home / Self Care | Payer: Medicare Other | Source: Ambulatory Visit | Attending: Nephrology | Admitting: Nephrology

## 2015-05-04 DIAGNOSIS — D631 Anemia in chronic kidney disease: Secondary | ICD-10-CM | POA: Insufficient documentation

## 2015-05-04 DIAGNOSIS — N183 Chronic kidney disease, stage 3 (moderate): Secondary | ICD-10-CM | POA: Diagnosis not present

## 2015-05-04 LAB — POCT HEMOGLOBIN-HEMACUE: HEMOGLOBIN: 10 g/dL — AB (ref 13.0–17.0)

## 2015-05-04 MED ORDER — EPOETIN ALFA 10000 UNIT/ML IJ SOLN
INTRAMUSCULAR | Status: AC
  Administered 2015-05-04: 10000 [IU] via SUBCUTANEOUS
  Filled 2015-05-04: qty 1

## 2015-05-04 MED ORDER — EPOETIN ALFA 20000 UNIT/ML IJ SOLN
INTRAMUSCULAR | Status: AC
  Administered 2015-05-04: 20000 [IU] via SUBCUTANEOUS
  Filled 2015-05-04: qty 1

## 2015-05-04 MED ORDER — EPOETIN ALFA 40000 UNIT/ML IJ SOLN: 30000.0000 [IU] | INTRAMUSCULAR | Status: DC

## 2015-05-06 NOTE — Telephone Encounter (Signed)
Stress test scheduled for 05/20/2015.

## 2015-05-10 ENCOUNTER — Other Ambulatory Visit: Payer: Medicare Other

## 2015-05-10 ENCOUNTER — Ambulatory Visit
Admission: RE | Admit: 2015-05-10 | Discharge: 2015-05-10 | Disposition: A | Discharge status: Home / Self Care | Payer: Medicare Other | Source: Ambulatory Visit | Attending: Interventional Radiology | Admitting: Interventional Radiology

## 2015-05-10 DIAGNOSIS — Z434 Encounter for attention to other artificial openings of digestive tract: Secondary | ICD-10-CM | POA: Insufficient documentation

## 2015-05-10 DIAGNOSIS — K8020 Calculus of gallbladder without cholecystitis without obstruction: Secondary | ICD-10-CM

## 2015-05-10 NOTE — Progress Notes (Signed)
Chief Complaint: Chief Complaint  Patient presents with  . Follow-up    follow up drainage catheter      Referring Physician(s): Dr Lindie Spruce.    History of Present Illness: [name] is a 81 y.o. male presenting today for evaluation of a percutaneous cholecystostomy tube placed 03/25/2015.  The patient was an admission through the emergency department at this time for acute cholecystitis. He discharged home after his admission, and the cholecystostomy tube has been on a drainage bag. He denies any fevers rigors or chills.  A through the tube cholangiogram was performed on today's evaluation.  Past Medical History  Diagnosis Date  . Diabetes mellitus   . Hypertension   . Hypercholesteremia   . Gout   . PVD (peripheral vascular disease) 7/05    LCE, known 60% RICA 7/12  . CAD (coronary artery disease) 2002    non critical  . Macular degeneration   . Chronic renal disease, stage III   . Claudication   . Palpitations     PVCs and PSVT on Holter monitoring  . Bilateral carotid artery disease     status post left carotid endarterectomy performed by Dr. Madilyn Fireman 05/12/04  . Anemia     Past Surgical History  Procedure Laterality Date  . 2d echocardiogram  03/31/2008    EF greater than 55%  . Cardiovascular stress test  06/30/2010    Nonischemic. Low risk  . Cerebral angiogram  04/03/2004    High-grade 80% ostial L ICA stenosis. Medical management.  . Abdominal aortogram  09/01/2007    Widely patent renal arteries. Medical management.  . Peripheral vascular angiogram  05/07/2011    No evidence of intracranial occlusions, stenosis, dissections, or aneurysms seen  . Carotid doppler  06/09/2012    R Vertebral-known occluded vessel, R Bulb/Proximal ICA-moderate to severe amount of plaque w/50-69% diameter reduction, L Subclavian-50-69% diameter reduction, L CEA-demonstrated increased velocities w/o evidence of hemodynamically significant stenosis  . Cardiac catheterization   10/17/2001    No significant CAD, normal LV systolic function.  . Carotid endarterectomy Left July 2005  . Lower extremity angiogram Bilateral 11/01/2014    Procedure: LOWER EXTREMITY ANGIOGRAM;  Surgeon: [name], MD;  Location: [facility] CATH LAB;  Service: Cardiovascular;  Laterality: Bilateral;  . Abdominal angiogram  11/01/2014    Procedure: ABDOMINAL ANGIOGRAM;  Surgeon: [name], MD;  Location: [facility] CATH LAB;  Service: Cardiovascular;;  . Knee surgery    . Hernia repair      Allergies: Labetalol  Medications: Prior to Admission medications   Medication Sig Start Date End Date Taking? Authorizing Provider  allopurinol (ZYLOPRIM) 100 MG tablet TAKE 1 TABLET (100 MG TOTAL) BY MOUTH AT BEDTIME. 01/27/15   Rhonda G Barrett, PA-C  amLODipine (NORVASC) 10 MG tablet Take 1 tablet by mouth every morning.  05/27/13   Historical Provider, MD  aspirin EC 81 MG tablet Take 81 mg by mouth every evening.    Historical Provider, MD  calcitRIOL (ROCALTROL) 0.25 MCG capsule Take 0.25 mcg by mouth every morning.  05/27/13   Historical Provider, MD  cholecalciferol (VITAMIN D) 1000 UNITS tablet Take 1,000 Units by mouth 2 (two) times daily.     Historical Provider, MD  clopidogrel (PLAVIX) 75 MG tablet TAKE 1 TABLET BY MOUTH DAILY 11/01/14   [name], MD  feeding supplement, RESOURCE BREEZE, (RESOURCE BREEZE) LIQD Take 1 Container by mouth 3 (three) times daily between meals. Patient not taking: Reported on 05/03/2015  03/29/15   Belkys A Regalado, MD  hydrALAZINE (APRESOLINE) 25 MG tablet Take 3 tablets (75 mg total) by mouth every 8 (eight) hours. 03/29/15   Belkys A Regalado, MD  Multiple Vitamins-Minerals (PRESERVISION AREDS PO) Take 2 tablets by mouth daily.    Historical Provider, MD  pantoprazole (PROTONIX) 40 MG tablet Take 1 tablet (40 mg total) by mouth daily. 03/29/15   Belkys A Regalado, MD  polyethylene glycol (MIRALAX / GLYCOLAX) packet Take 17 g by mouth 2 (two) times daily. 03/29/15    Belkys A Regalado, MD  sitaGLIPtin (JANUVIA) 25 MG tablet Take 1 tablet (25 mg total) by mouth daily. 05/03/15   Reather Littler, MD  terazosin (HYTRIN) 10 MG capsule TAKE 1 CAPSULE (10 MG TOTAL) BY MOUTH AT BEDTIME. 11/24/14   [name], MD     Family History  Problem Relation Age of Onset  . Stroke Sister   . Heart disease Brother   . Diabetes Neg Hx     History   Social History  . Marital Status: Widowed    Spouse Name: N/A  . Number of Children: N/A  . Years of Education: N/A   Social History Main Topics  . Smoking status: Former Smoker -- 1.00 packs/day for 40 years  . Smokeless tobacco: Never Used     Comment: quit approx. 20 years ago.  . Alcohol Use: No  . Drug Use: Not on file  . Sexual Activity: No   Other Topics Concern  . Not on file   Social History Narrative     Review of Systems: A 12 point ROS discussed and pertinent positives are indicated in the HPI above.  All other systems are negative.  Review of Systems  Vital Signs: BP 190/72 mmHg  Pulse 67  Temp(Src) 98 F (36.7 C) (Oral)  Resp 13  Ht  (1.676 m)  Wt 153 lb (69.4 kg)  BMI 24.71 kg/m2  SpO2 96%  Physical Exam  Mallampati Score:     Imaging: Dg Sinus/fist Tube Chk-non Gi  05/10/2015   CLINICAL DATA:  81 year old male.  The patient has undergone percutaneous cholecystostomy Mar 25, 2015 for acute calculus cholecystitis. A presumed cystic duct obstruction.  The patient presents today for a tube check and possible removal.  EXAM: CHOLECYSTOSTOMY TUBE INJECTION  TECHNIQUE: The procedure, risks, benefits, and alternatives were explained to the patient. Questions regarding the procedure were encouraged and answered. The patient understands and consents to the procedure.  Under fluoroscopy, indwelling cholecystostomy tube was injected with contrast.  Once the patency of ductal system was confirmed, the contrast was flushed with 10 cc of normal saline.  Images were stored.  Patient tolerated  the procedure well and remained hemodynamically stable throughout.  No complications were encountered and no significant blood loss was encounter.  CONTRAST:  15 cc contrast  FLUOROSCOPY TIME:  Number of Acquired Images:  5  COMPARISON:  MRI 03/22/2015  FINDINGS: Hand injection of indwelling cholecystostomy tube demonstrates patency of the cystic duct, common hepatic duct, and common bile duct. Partial filling of the intrahepatic ducts at the liver hilum. Contrast traversed the ampulla, and partially filled the duodenum.  No filling defects identified.  Caliber of the extrahepatic ductal system within normal limits.  IMPRESSION: Status post hand injection of indwelling percutaneous cholecystostomy tube. Injection demonstrates patency of the cystic duct and the extrahepatic biliary ducts, with free flow of contrast across the ampulla.  These results were called by telephone at the time of  interpretation on 05/10/2015 at 3:02 pm to Dr. Jimmye NormanJAMES WYATT , who verbally acknowledged these results.  Signed,  Yvone NeuJaime S. Loreta AveWagner, DO  [facility]  [facility]   Electronically Signed   By: Gilmer MorJaime  Hollynn Garno D.O.   On: 05/10/2015 15:02    Labs:  CBC:  Recent Labs  03/26/15 0431 03/27/15 0740 03/28/15 0536 03/29/15 0905 04/28/15 0946 05/04/15 0933  WBC 8.7 14.9* 13.3* 10.6*  --   --   HGB 9.5* 9.3* 9.4* 9.8* 9.3* 10.0*  HCT 29.2* 28.6* 29.4* 30.0*  --   --   PLT 182 170 186 198  --   --     COAGS:  Recent Labs  11/01/14 0429 03/26/15 0431  INR 1.07 1.35    BMP:  Recent Labs  03/26/15 0431 03/27/15 0740 03/28/15 0536 03/29/15 0905  NA 137 136 137 136  K 3.5 3.4* 3.6 3.4*  CL 97* 97* 98* 97*  CO2 28 25 25 25   GLUCOSE 96 236* 157* 233*  BUN 113* 115* 112* 107*  CALCIUM 8.5* 8.4* 8.8* 8.9  CREATININE 3.72* 4.37* 4.94* 4.66*  GFRNONAA 14* 11* 10* 11*  GFRAA 16* 13* 11* 12*    LIVER FUNCTION TESTS:  Recent Labs  03/22/15 0422 03/24/15 0531  03/25/15 0612 03/28/15 0536  BILITOT 1.2 0.9 0.8 0.8  AST 372* 43* 32 27  ALT 292* 104* 73* 52  ALKPHOS 318* 193* 201* 146*  PROT 5.9* 5.3* 5.6* 5.9*  ALBUMIN 3.2* 2.5* 2.3* 2.3*    TUMOR MARKERS: No results for input(s): AFPTM, CEA, CA199, CHROMGRNA in the last 8760 hours.  Assessment and Plan:  [name] has a percutaneous cholecystostomy tube placed 03/25/2015, with injection on today's fluoroscopic study demonstrating complete patency of the cystic duct and the extrahepatic biliary ducts, including the common bile duct. No filling defects to suggest persisting stones.  Findings of the study were discussed with Dr. Lindie SpruceWyatt, and our impression is that the cholecystostomy tube may be safely removed.  The patient and the patient's family understand the plan of care. The tube was removed at the bedside in its entirety.    [name] understands he may return for evaluation on an as-needed basis, with no plans at this point for a scheduled follow-up visit with emergency general surgery or with interventional radiology.  Thank you for this interesting consult.  I greatly enjoyed meeting Pasty ArchRoy C Creasy and look forward to participating in their care.  SignedGilmer Mor: Nedra Mcinnis 05/10/2015, 3:58 PM   I spent a total of    15 Minutes in face to face in clinical consultation, greater than 50% of which was counseling/coordinating care for percutaneous cholecystostomy tube.

## 2015-05-11 ENCOUNTER — Encounter (HOSPITAL_COMMUNITY)
Admission: RE | Admit: 2015-05-11 | Discharge: 2015-05-11 | Disposition: A | Discharge status: Home / Self Care | Payer: Medicare Other | Source: Ambulatory Visit | Attending: Nephrology | Admitting: Nephrology

## 2015-05-11 DIAGNOSIS — D631 Anemia in chronic kidney disease: Secondary | ICD-10-CM | POA: Diagnosis not present

## 2015-05-11 LAB — POCT HEMOGLOBIN-HEMACUE: Hemoglobin: 10.9 g/dL — ABNORMAL LOW (ref 13.0–17.0)

## 2015-05-11 MED ORDER — EPOETIN ALFA 10000 UNIT/ML IJ SOLN
INTRAMUSCULAR | Status: AC
Start: 2015-05-11 — End: 2015-05-11
  Administered 2015-05-11: 10000 [IU]
  Filled 2015-05-11: qty 1

## 2015-05-11 MED ORDER — EPOETIN ALFA 20000 UNIT/ML IJ SOLN
INTRAMUSCULAR | Status: AC
Start: 2015-05-11 — End: 2015-05-11
  Administered 2015-05-11: 20000 [IU]
  Filled 2015-05-11: qty 1

## 2015-05-11 MED ORDER — EPOETIN ALFA 40000 UNIT/ML IJ SOLN: 30000.0000 [IU] | INTRAMUSCULAR | Status: DC

## 2015-05-13 ENCOUNTER — Telehealth (HOSPITAL_COMMUNITY): Payer: Self-pay | Admitting: *Deleted

## 2015-05-18 ENCOUNTER — Encounter (HOSPITAL_COMMUNITY)
Admission: RE | Admit: 2015-05-18 | Discharge: 2015-05-18 | Disposition: A | Discharge status: Home / Self Care | Payer: Medicare Other | Source: Ambulatory Visit | Attending: Nephrology | Admitting: Nephrology

## 2015-05-18 DIAGNOSIS — D631 Anemia in chronic kidney disease: Secondary | ICD-10-CM | POA: Diagnosis not present

## 2015-05-18 DIAGNOSIS — Z79899 Other long term (current) drug therapy: Secondary | ICD-10-CM | POA: Insufficient documentation

## 2015-05-18 DIAGNOSIS — Z5181 Encounter for therapeutic drug level monitoring: Secondary | ICD-10-CM | POA: Diagnosis not present

## 2015-05-18 DIAGNOSIS — N183 Chronic kidney disease, stage 3 (moderate): Secondary | ICD-10-CM | POA: Diagnosis not present

## 2015-05-18 LAB — POCT HEMOGLOBIN-HEMACUE: Hemoglobin: 12.8 g/dL — ABNORMAL LOW (ref 13.0–17.0)

## 2015-05-18 MED ORDER — EPOETIN ALFA 40000 UNIT/ML IJ SOLN: 30000.0000 [IU] | INTRAMUSCULAR | Status: DC

## 2015-05-20 ENCOUNTER — Inpatient Hospital Stay (HOSPITAL_COMMUNITY): Admission: RE | Admit: 2015-05-20 | Payer: Medicare Other | Source: Ambulatory Visit

## 2015-05-25 NOTE — Telephone Encounter (Signed)
Patient called back and cancelled stress test due to the fact that his surgery was cancelled.

## 2015-06-01 ENCOUNTER — Ambulatory Visit (INDEPENDENT_AMBULATORY_CARE_PROVIDER_SITE_OTHER): Payer: Medicare Other | Admitting: Endocrinology

## 2015-06-01 ENCOUNTER — Encounter (HOSPITAL_COMMUNITY)
Admission: RE | Admit: 2015-06-01 | Discharge: 2015-06-01 | Disposition: A | Discharge status: Home / Self Care | Payer: Medicare Other | Source: Ambulatory Visit | Attending: Nephrology | Admitting: Nephrology

## 2015-06-01 ENCOUNTER — Other Ambulatory Visit: Payer: Self-pay | Admitting: *Deleted

## 2015-06-01 ENCOUNTER — Encounter: Payer: Self-pay | Admitting: Endocrinology

## 2015-06-01 VITALS — BP 134/30 | HR 59 | Temp 98.3°F | Resp 14 | Ht 66.0 in | Wt 164.2 lb

## 2015-06-01 DIAGNOSIS — E1165 Type 2 diabetes mellitus with hyperglycemia: Secondary | ICD-10-CM

## 2015-06-01 DIAGNOSIS — IMO0002 Reserved for concepts with insufficient information to code with codable children: Secondary | ICD-10-CM

## 2015-06-01 DIAGNOSIS — D631 Anemia in chronic kidney disease: Secondary | ICD-10-CM | POA: Diagnosis present

## 2015-06-01 DIAGNOSIS — N183 Chronic kidney disease, stage 3 (moderate): Secondary | ICD-10-CM | POA: Insufficient documentation

## 2015-06-01 LAB — FERRITIN: Ferritin: 95 ng/mL (ref 24–336)

## 2015-06-01 LAB — IRON AND TIBC
IRON: 81 ug/dL (ref 45–182)
Saturation Ratios: 33 % (ref 17.9–39.5)
TIBC: 249 ug/dL — ABNORMAL LOW (ref 250–450)
UIBC: 168 ug/dL

## 2015-06-01 LAB — POCT HEMOGLOBIN-HEMACUE: HEMOGLOBIN: 11.7 g/dL — AB (ref 13.0–17.0)

## 2015-06-01 MED ORDER — EPOETIN ALFA 40000 UNIT/ML IJ SOLN: 30000.0000 [IU] | INTRAMUSCULAR | Status: DC

## 2015-06-01 MED ORDER — EPOETIN ALFA 10000 UNIT/ML IJ SOLN
INTRAMUSCULAR | Status: AC
  Administered 2015-06-01: 10000 [IU]
  Filled 2015-06-01: qty 1

## 2015-06-01 MED ORDER — EPOETIN ALFA 20000 UNIT/ML IJ SOLN
INTRAMUSCULAR | Status: AC
  Administered 2015-06-01: 20000 [IU]
  Filled 2015-06-01: qty 1

## 2015-06-01 MED ORDER — GLUCOSE BLOOD VI STRP: ORAL_STRIP | Status: AC

## 2015-06-01 NOTE — Progress Notes (Signed)
Patient ID: [name], male   DOB: 11-07-1929, 81 y.o.   MRN: [id number]           Reason for Appointment: Follow-up for Type 2 Diabetes  Referring physician: Cam Hai   History of Present Illness:          Date of diagnosis of type 2 diabetes mellitus: About 2011       Background history:  She was diagnosed to have diabetes by the primary care physician but details are not available He was also being evaluated at that time for renal dysfunction and poorly controlled blood pressure He may have been given metformin initially but this was stopped about 4 years ago Most likely has been on Januvia and no other medications in the past for his diabetes, he does not remember details  Recent history:   He has been taking Januvia, mostly with samples over the last few months  Because of his renal dysfunction he was told to take only 25 mg of Januvia Since his blood sugars were not overall looking high and A1c was near 7% he was not given additional medications Also he did not have his blood sugar meter for review on the last visit and still does not have it for his office visit today     Current blood sugar patterns and problems identified:  Patient is taking his blood sugars mostly about noontime and they are again about 130-150  He does not check blood sugars any other time despite instructions last time  Today after lunch his blood sugar is around 200  Oral hypoglycemic drugs the patient is taking are:    Januvia 25 mg daily    Side effects from medications have been: None  Compliance with the medical regimen: Fair Hypoglycemia: Never    Glucose monitoring:  done one time a day         Glucometer: ?  Accu-Chek       Blood Glucose readings 130-150 at about noon  Self-care: The diet that the patient has been following is: None    Typical meal intake: Breakfast is  cereal or some kind of biscuit, usually eats at fast food restaurants or [employer] once a day, .   Tries to have more  vegetables at dinner time His snacks will be with fruit or oatmeal cookies        Dietician visit, most recent: 2011               Exercise: recently gardening and golf  Weight history: His weight has been variable based on his degree of fluid retention  Wt Readings from Last 3 Encounters:  06/02/15 164 lb (74.39 kg)  06/01/15 164 lb 3.2 oz (74.481 kg)  05/10/15 153 lb (69.4 kg)    Glycemic control:   Lab Results  Component Value Date   HGBA1C 7.2* 03/23/2015   HGBA1C 7.4* 03/22/2015   Lab Results  Component Value Date   LDLCALC 75 06/22/2014   CREATININE 4.66* 03/29/2015         Medication List       This list is accurate as of: 06/01/15 11:59 PM.  Always use your most recent med list.               allopurinol 100 MG tablet  Commonly known as:  ZYLOPRIM  TAKE 1 TABLET (100 MG TOTAL) BY MOUTH AT BEDTIME.     amLODipine 10 MG tablet  Commonly known as:  NORVASC  Take  1 tablet by mouth every morning.     aspirin EC 81 MG tablet  Take 81 mg by mouth every evening.     calcitRIOL 0.25 MCG capsule  Commonly known as:  ROCALTROL  Take 0.25 mcg by mouth every morning.     cholecalciferol 1000 UNITS tablet  Commonly known as:  VITAMIN D  Take 1,000 Units by mouth 2 (two) times daily.     clopidogrel 75 MG tablet  Commonly known as:  PLAVIX  TAKE 1 TABLET BY MOUTH DAILY     feeding supplement Liqd  Take 1 Container by mouth 3 (three) times daily between meals.     glucose blood test strip  Commonly known as:  ACCU-CHEK AVIVA PLUS  Use as instructed to check blood sugar 2 times per day dx code E11.9     hydrALAZINE 25 MG tablet  Commonly known as:  APRESOLINE  Take 3 tablets (75 mg total) by mouth every 8 (eight) hours.     pantoprazole 40 MG tablet  Commonly known as:  PROTONIX  Take 1 tablet (40 mg total) by mouth daily.     polyethylene glycol packet  Commonly known as:  MIRALAX / GLYCOLAX  Take 17 g by mouth 2 (two) times daily.      PRESERVISION AREDS PO  Take 2 tablets by mouth daily.     sitaGLIPtin 25 MG tablet  Commonly known as:  JANUVIA  Take 1 tablet (25 mg total) by mouth daily.     terazosin 10 MG capsule  Commonly known as:  HYTRIN  TAKE 1 CAPSULE (10 MG TOTAL) BY MOUTH AT BEDTIME.        Allergies:  Allergies  Allergen Reactions  . Labetalol Swelling    angioedema    Past Medical History  Diagnosis Date  . Diabetes mellitus   . Hypertension   . Hypercholesteremia   . Gout   . PVD (peripheral vascular disease) 7/05    LCE, known 60% RICA 7/12  . CAD (coronary artery disease) 2002    non critical  . Macular degeneration   . Chronic renal disease, stage III   . Claudication   . Palpitations     PVCs and PSVT on Holter monitoring  . Bilateral carotid artery disease     status post left carotid endarterectomy performed by Dr. Madilyn Fireman 05/12/04  . Anemia     Past Surgical History  Procedure Laterality Date  . 2d echocardiogram  03/31/2008    EF greater than 55%  . Cardiovascular stress test  06/30/2010    Nonischemic. Low risk  . Cerebral angiogram  04/03/2004    High-grade 80% ostial L ICA stenosis. Medical management.  . Abdominal aortogram  09/01/2007    Widely patent renal arteries. Medical management.  . Peripheral vascular angiogram  05/07/2011    No evidence of intracranial occlusions, stenosis, dissections, or aneurysms seen  . Carotid doppler  06/09/2012    R Vertebral-known occluded vessel, R Bulb/Proximal ICA-moderate to severe amount of plaque w/50-69% diameter reduction, L Subclavian-50-69% diameter reduction, L CEA-demonstrated increased velocities w/o evidence of hemodynamically significant stenosis  . Cardiac catheterization  10/17/2001    No significant CAD, normal LV systolic function.  . Carotid endarterectomy Left July 2005  . Lower extremity angiogram Bilateral 11/01/2014    Procedure: LOWER EXTREMITY ANGIOGRAM;  Surgeon: [name], MD;  Location: [facility] CATH LAB;   Service: Cardiovascular;  Laterality: Bilateral;  . Abdominal angiogram  11/01/2014    Procedure:  ABDOMINAL ANGIOGRAM;  Surgeon: [name], MD;  Location: [facility] CATH LAB;  Service: Cardiovascular;;  . Knee surgery    . Hernia repair      Family History  Problem Relation Age of Onset  . Stroke Sister   . Heart disease Brother   . Diabetes Neg Hx     Social History:  reports that he has quit smoking. He has never used smokeless tobacco. He reports that he does not drink alcohol. His drug history is not on file.    Review of Systems    Lipid history: He has not been on any medications for lipids    Lab Results  Component Value Date   CHOL 139 06/22/2014   HDL 41 06/22/2014   LDLCALC 75 06/22/2014   TRIG 116 06/22/2014   CHOLHDL 3.4 06/22/2014           RENAL insufficiency: Last GFR is 12. This is apparently from hypertensive kidney disease and has had complete locations including anemia, secondary hyperparathyroidism managed with Procrit and calcitriol respectively  Lab Results  Component Value Date   CREATININE 4.66* 03/29/2015   BUN 107* 03/29/2015   NA 136 03/29/2015   K 3.4* 03/29/2015   CL 97* 03/29/2015   CO2 25 03/29/2015    Hypertension: Has had this for at least 30-40 years and has been difficult to control.  Followed by nephrologist.  He thinks blood pressure is recently better.  Currently on amlodipine, clonidine, hydralazine and Hytrin  Neurological:  Has no numbness, burning, pains or tingling in feet.  May have coldness in his feet. Diabetic foot exam normal in 7/16   Physical Examination:  BP 134/30 mmHg  Pulse 59  Temp(Src) 98.3 F (36.8 C)  Resp 14  Ht  (1.676 m)  Wt 164 lb 3.2 oz (74.481 kg)  BMI 26.52 kg/m2  SpO2 96%        ASSESSMENT:  Diabetes type 2 with BMI 26 He appears to have fairly good blood sugar control with his A1c just over 7%  in 02/2015   This may be adequate considering his age and multiple medical  problems. Also his blood sugars are not appearing higher recently with taking only 25 mg Januvia which is appropriate for his renal function  He may be getting some postprandial hyperglycemia but today his blood sugar was checked about 1-1/2 hours after eating  PLAN:   He will continue to take Januvia 25 mg daily for now  He will check his blood sugars consistently couple of hours after various meals and bring this for download on the next visit  Will consider adding Prandin if he has consistent hyperglycemia at any given meal  Consultation with dietitian for meal planning  Regular walking for exercise  Follow-up in 4 weeks   Patient Instructions  Check blood sugars on waking up .. 2 .. times a week Also check blood sugars about 2 hours after a meal and do this after different meals by rotation  Recommended blood sugar levels on waking up is 90-130 and about 2 hours after meal is 140-180 Please bring blood sugar monitor to each visit.     [name] 06/02/2015, 12:19 PM   Note: This office note was prepared with Dragon voice recognition system technology. Any transcriptional errors that result from this process are unintentional.

## 2015-06-01 NOTE — Patient Instructions (Signed)
Check blood sugars on waking up .. 2 .. times a week Also check blood sugars about 2 hours after a meal and do this after different meals by rotation  Recommended blood sugar levels on waking up is 90-130 and about 2 hours after meal is 140-180 Please bring blood sugar monitor to each visit.   

## 2015-06-02 ENCOUNTER — Ambulatory Visit: Payer: Medicare Other | Admitting: Dietician

## 2015-06-02 ENCOUNTER — Encounter: Payer: Self-pay | Admitting: Dietician

## 2015-06-02 ENCOUNTER — Encounter: Payer: Medicare Other | Admitting: Dietician

## 2015-06-02 ENCOUNTER — Other Ambulatory Visit: Payer: Self-pay | Admitting: Cardiology

## 2015-06-02 VITALS — Ht 66.0 in | Wt 164.0 lb

## 2015-06-02 DIAGNOSIS — I701 Atherosclerosis of renal artery: Secondary | ICD-10-CM

## 2015-06-02 DIAGNOSIS — D631 Anemia in chronic kidney disease: Secondary | ICD-10-CM | POA: Diagnosis not present

## 2015-06-02 DIAGNOSIS — I150 Renovascular hypertension: Secondary | ICD-10-CM

## 2015-06-02 DIAGNOSIS — E119 Type 2 diabetes mellitus without complications: Secondary | ICD-10-CM

## 2015-06-02 DIAGNOSIS — N185 Chronic kidney disease, stage 5: Secondary | ICD-10-CM

## 2015-06-02 NOTE — Progress Notes (Signed)
  Medical Nutrition Therapy:  Appt start time: 0900 end time:  1000.   Assessment:  Primary concerns today: Patient is here alone today.  He was referred for uncontrolled type 2 diabetes. Hx of type 2 diabetes since about 2011.  He also has CKD.  Labs from hospitalization 531/16:  Bun 107, Creat:  4.66, EGRF:  11, K+3.4.  HgbAic 7.2% 03/23/15.  No current renal labs available.    Patient is a widower (wife passed about 10 years ago). He typically does not cook and eats breakfast and dinner out. His daughter lives up stairs but "I don't ask anything of her as she is a cancer survivor."  He does not use added salt but diet is high in sodium and fat.  He is looking forward to a weekend of golf with his friends at the beach in December and wants to maintain good health.  Preferred Learning Style:   No preference indicated   Learning Readiness:   Contemplating  MEDICATIONS: see list   DIETARY INTAKE:  24-hr recall:  He does not use added salt but diet is high in sodium and fat as he eats out 2 times per day. B ( AM): gravy biscuit, coffee with 1 sweet and low OR 2 scrambled eggs, bacon on a bun OR cereal or oatmeal at home  Snk ( AM): NABS or oatmeal cookie occaisonally  L ( PM): tomato sandwich on [employer] bread  Snk ( PM): cookie D ( PM): [employer] (BBQ plate]) or [employer] (vegetable plate) or [employer] (omelet or scrambled eggs with bacon and toast) Snk ( PM): none or popcorn occasionally Beverages: diet soda, water, coffee with sweet and low  Usual physical activity: ADL's, golf occasionally, Mows neighbors yard and own (rider)  Estimated energy needs: 2000 calories 60 g protein  Progress Towards Goal(s):  In progress.   Nutritional Diagnosis:  NB-1.1 Food and nutrition-related knowledge deficit As related to low sodium diet.  As evidenced by diet hx and patient report.    Intervention:  Nutrition counseling education regarding a low sodium diet, options for eating out and highest  sodium foods to avoid.  Discussed foods for diabetes control and lower fat briefly. Patient verbalized information but verbalized difficulty in change.  I do not expect that he will not eat out and expect that sodium intake will continue to be higher than recommended.  Continue to avoid adding any salt to your foods. Avoid bacon, sausage, ham, hot dogs and any other processed meat. Avoid canned vegetables unless it says low sodium. Rinse canned pintos and tuna with water to decrease the sodium.  Choose baked rather than fried. Choose low sodium cheese. Read labels for sodium content.   Limit sodium to 2000 mg per day.  Choose foods with the lowest sodium content when reading labels. When eating out, an English muffin or toast is a better choice than a biscuit. When eating out ask for the foods to be prepared without salt as much as possible.   Teaching Method Utilized:  Visual Auditory  Handouts given during visit include:  Nutrition therapy for a low sodium diet.  Barriers to learning/adherence to lifestyle change: he does not cook  Demonstrated degree of understanding via:  Teach Back   Monitoring/Evaluation:  Dietary intake, exercise, label reading, and body weight prn.

## 2015-06-02 NOTE — Patient Instructions (Signed)
Continue to avoid adding any salt to your foods. Avoid bacon, sausage, ham, hot dogs and any other processed meat. Avoid canned vegetables unless it says low sodium. Rinse canned pintos and tuna with water to decrease the sodium.  Choose baked rather than fried. Choose low sodium cheese. Read labels for sodium content.   Limit sodium to 2000 mg per day.  Choose foods with the lowest sodium content when reading labels. When eating out, an English muffin or toast is a better choice than a biscuit. When eating out ask for the foods to be prepared without salt as much as possible.

## 2015-06-07 ENCOUNTER — Other Ambulatory Visit (HOSPITAL_COMMUNITY): Payer: Self-pay | Admitting: *Deleted

## 2015-06-08 ENCOUNTER — Encounter (HOSPITAL_COMMUNITY)
Admission: RE | Admit: 2015-06-08 | Discharge: 2015-06-08 | Disposition: A | Discharge status: Home / Self Care | Payer: Medicare Other | Source: Ambulatory Visit | Attending: Nephrology | Admitting: Nephrology

## 2015-06-08 DIAGNOSIS — D631 Anemia in chronic kidney disease: Secondary | ICD-10-CM | POA: Diagnosis not present

## 2015-06-08 LAB — POCT HEMOGLOBIN-HEMACUE: Hemoglobin: 11.8 g/dL — ABNORMAL LOW (ref 13.0–17.0)

## 2015-06-08 MED ORDER — EPOETIN ALFA 20000 UNIT/ML IJ SOLN
INTRAMUSCULAR | Status: AC
Start: 2015-06-08 — End: 2015-06-08
  Administered 2015-06-08: 20000 [IU] via SUBCUTANEOUS
  Filled 2015-06-08: qty 1

## 2015-06-08 MED ORDER — EPOETIN ALFA 40000 UNIT/ML IJ SOLN: 30000.0000 [IU] | INTRAMUSCULAR | Status: DC

## 2015-06-08 MED ORDER — EPOETIN ALFA 10000 UNIT/ML IJ SOLN
INTRAMUSCULAR | Status: AC
  Administered 2015-06-08: 10000 [IU] via SUBCUTANEOUS
  Filled 2015-06-08: qty 1

## 2015-06-10 ENCOUNTER — Ambulatory Visit
Admission: RE | Admit: 2015-06-10 | Discharge: 2015-06-10 | Disposition: A | Discharge status: Home / Self Care | Payer: Medicare Other | Source: Ambulatory Visit | Attending: Cardiology | Admitting: Cardiology

## 2015-06-10 DIAGNOSIS — I150 Renovascular hypertension: Secondary | ICD-10-CM

## 2015-06-10 DIAGNOSIS — I701 Atherosclerosis of renal artery: Secondary | ICD-10-CM

## 2015-06-15 ENCOUNTER — Encounter (HOSPITAL_COMMUNITY)
Admission: RE | Admit: 2015-06-15 | Discharge: 2015-06-15 | Disposition: A | Discharge status: Home / Self Care | Payer: Medicare Other | Source: Ambulatory Visit | Attending: Nephrology | Admitting: Nephrology

## 2015-06-15 DIAGNOSIS — D631 Anemia in chronic kidney disease: Secondary | ICD-10-CM | POA: Diagnosis not present

## 2015-06-15 LAB — POCT HEMOGLOBIN-HEMACUE: HEMOGLOBIN: 11.4 g/dL — AB (ref 13.0–17.0)

## 2015-06-15 MED ORDER — EPOETIN ALFA 20000 UNIT/ML IJ SOLN
INTRAMUSCULAR | Status: AC
  Administered 2015-06-15: 20000 [IU] via SUBCUTANEOUS
  Filled 2015-06-15: qty 1

## 2015-06-15 MED ORDER — EPOETIN ALFA 40000 UNIT/ML IJ SOLN: 30000.0000 [IU] | INTRAMUSCULAR | Status: DC

## 2015-06-15 MED ORDER — EPOETIN ALFA 10000 UNIT/ML IJ SOLN
INTRAMUSCULAR | Status: AC
  Administered 2015-06-15: 10000 [IU] via SUBCUTANEOUS
  Filled 2015-06-15: qty 1

## 2015-06-22 ENCOUNTER — Encounter (HOSPITAL_COMMUNITY)
Admission: RE | Admit: 2015-06-22 | Discharge: 2015-06-22 | Disposition: A | Discharge status: Home / Self Care | Payer: Medicare Other | Source: Ambulatory Visit | Attending: Nephrology | Admitting: Nephrology

## 2015-06-22 DIAGNOSIS — D631 Anemia in chronic kidney disease: Secondary | ICD-10-CM | POA: Diagnosis not present

## 2015-06-22 MED ORDER — EPOETIN ALFA 40000 UNIT/ML IJ SOLN: 30000.0000 [IU] | INTRAMUSCULAR | Status: DC

## 2015-07-13 ENCOUNTER — Other Ambulatory Visit (INDEPENDENT_AMBULATORY_CARE_PROVIDER_SITE_OTHER): Payer: Medicare Other

## 2015-07-13 DIAGNOSIS — IMO0002 Reserved for concepts with insufficient information to code with codable children: Secondary | ICD-10-CM

## 2015-07-13 DIAGNOSIS — E1165 Type 2 diabetes mellitus with hyperglycemia: Secondary | ICD-10-CM | POA: Diagnosis not present

## 2015-07-13 LAB — HEMOGLOBIN A1C: Hgb A1c MFr Bld: 6.3 % (ref 4.6–6.5)

## 2015-07-14 LAB — FRUCTOSAMINE: Fructosamine: 279 umol/L (ref 0–285)

## 2015-07-20 ENCOUNTER — Encounter: Payer: Self-pay | Admitting: Endocrinology

## 2015-07-20 ENCOUNTER — Other Ambulatory Visit: Payer: Self-pay | Admitting: *Deleted

## 2015-07-20 ENCOUNTER — Ambulatory Visit (INDEPENDENT_AMBULATORY_CARE_PROVIDER_SITE_OTHER): Payer: Medicare Other | Admitting: Endocrinology

## 2015-07-20 VITALS — BP 178/48 | HR 79 | Temp 98.5°F | Resp 16 | Ht 66.0 in | Wt 162.2 lb

## 2015-07-20 DIAGNOSIS — E1165 Type 2 diabetes mellitus with hyperglycemia: Secondary | ICD-10-CM | POA: Diagnosis not present

## 2015-07-20 DIAGNOSIS — Z23 Encounter for immunization: Secondary | ICD-10-CM

## 2015-07-20 DIAGNOSIS — IMO0002 Reserved for concepts with insufficient information to code with codable children: Secondary | ICD-10-CM

## 2015-07-20 MED ORDER — ACCU-CHEK FASTCLIX LANCETS MISC: Status: AC

## 2015-07-20 NOTE — Patient Instructions (Signed)
Check blood sugars on waking up .. 2 .. times a week Also check blood sugars about 2 hours after a meal and do this after different meals by rotation  Recommended blood sugar levels on waking up is 90-130 and about 2 hours after meal is 140-180 Please bring blood sugar monitor to each visit.   

## 2015-07-20 NOTE — Progress Notes (Signed)
Patient ID: [name], male   DOB: 04-Mar-1930, 81 y.o.   MRN: [id number]           Reason for Appointment: Follow-up for Type 2 Diabetes  Referring physician: Cam Hai   History of Present Illness:          Date of diagnosis of type 2 diabetes mellitus: About 2011       Background history:  She was diagnosed to have diabetes by the primary care physician but details are not available He was also being evaluated at that time for renal dysfunction and poorly controlled blood pressure He may have been given metformin initially but this was stopped about 4 years ago Most likely has been on Januvia and no other medications in the past for his diabetes, he does not remember details  Recent history:   He has been taking Januvia,  with samples over the last few months  Because of his renal dysfunction he was told to take only 25 mg of Januvia and is using a pill cutter to cut 100 mg tablets He generally has a relatively good blood sugars at home even though A1c previously had been over 7%; was previously not checking blood sugars after meals A1c is now significantly better 6.3 He did however bring his blood sugar monitor for download today which he has not done regularly    Current blood sugar patterns and problems identified:  Patient is checking blood sugars very sporadically and on for the last 2 weeks  Blood sugar after breakfast today is 155 in the office  He has relatively high readings in the mornings and early afternoons but only 2 readings over 200  Readings after dinner in the evening are fairly good  Oral hypoglycemic drugs the patient is taking are:    Januvia 25 mg daily    Side effects from medications have been: None  Compliance with the medical regimen: Fair Hypoglycemia: Never    Glucose monitoring:  done < one time a day         Glucometer: ?  Accu-Chek       Blood Glucose readings  Mean values apply above for all meters except median for One  Touch  PRE-MEAL Fasting  midday  Dinner  PCS  Overall  Glucose range:  167   144-220   223   112-153    Mean/median:        Self-care: The diet that the patient has been following is: None    Typical meal intake: Breakfast is  cereal or some kind of biscuit, sometimes eats at fast food restaurants or [employer] usually once a day, dinner at 6 pm.   Tries to have more vegetables at dinner time His snacks will be with fruit or oatmeal cookies        Dietician visit, most recent: 2011               Exercise:  some activity with gardening   Weight history: His weight has been variable based on his degree of fluid retention  Wt Readings from Last 3 Encounters:  07/20/15 162 lb 3.2 oz (73.573 kg)  06/02/15 164 lb (74.39 kg)  06/01/15 164 lb 3.2 oz (74.481 kg)    Glycemic control:   Lab Results  Component Value Date   HGBA1C 6.3 07/13/2015   HGBA1C 7.2* 03/23/2015   HGBA1C 7.4* 03/22/2015   Lab Results  Component Value Date   LDLCALC 75 06/22/2014   CREATININE 4.66*  03/29/2015         Medication List       This list is accurate as of: 07/20/15 11:59 PM.  Always use your most recent med list.               ACCU-CHEK FASTCLIX LANCETS Misc  Use to check blood sugar 2 times per day dx code E11.9     allopurinol 100 MG tablet  Commonly known as:  ZYLOPRIM  TAKE 1 TABLET (100 MG TOTAL) BY MOUTH AT BEDTIME.     amLODipine 10 MG tablet  Commonly known as:  NORVASC  Take 1 tablet by mouth every morning.     aspirin EC 81 MG tablet  Take 81 mg by mouth every evening.     calcitRIOL 0.25 MCG capsule  Commonly known as:  ROCALTROL  Take 0.25 mcg by mouth every morning.     cholecalciferol 1000 UNITS tablet  Commonly known as:  VITAMIN D  Take 1,000 Units by mouth 2 (two) times daily.     clopidogrel 75 MG tablet  Commonly known as:  PLAVIX  TAKE 1 TABLET BY MOUTH DAILY     feeding supplement Liqd  Take 1 Container by mouth 3 (three) times daily between meals.      glucose blood test strip  Commonly known as:  ACCU-CHEK AVIVA PLUS  Use as instructed to check blood sugar 2 times per day dx code E11.9     hydrALAZINE 25 MG tablet  Commonly known as:  APRESOLINE  Take 3 tablets (75 mg total) by mouth every 8 (eight) hours.     pantoprazole 40 MG tablet  Commonly known as:  PROTONIX  Take 1 tablet (40 mg total) by mouth daily.     polyethylene glycol packet  Commonly known as:  MIRALAX / GLYCOLAX  Take 17 g by mouth 2 (two) times daily.     PRESERVISION AREDS PO  Take 2 tablets by mouth daily.     sitaGLIPtin 25 MG tablet  Commonly known as:  JANUVIA  Take 1 tablet (25 mg total) by mouth daily.     terazosin 10 MG capsule  Commonly known as:  HYTRIN  TAKE 1 CAPSULE (10 MG TOTAL) BY MOUTH AT BEDTIME.        Allergies:  Allergies  Allergen Reactions  . Labetalol Swelling    angioedema    Past Medical History  Diagnosis Date  . Diabetes mellitus   . Hypertension   . Hypercholesteremia   . Gout   . PVD (peripheral vascular disease) 7/05    LCE, known 60% RICA 7/12  . CAD (coronary artery disease) 2002    non critical  . Macular degeneration   . Chronic renal disease, stage III   . Claudication   . Palpitations     PVCs and PSVT on Holter monitoring  . Bilateral carotid artery disease     status post left carotid endarterectomy performed by Dr. Madilyn Fireman 05/12/04  . Anemia     Past Surgical History  Procedure Laterality Date  . 2d echocardiogram  03/31/2008    EF greater than 55%  . Cardiovascular stress test  06/30/2010    Nonischemic. Low risk  . Cerebral angiogram  04/03/2004    High-grade 80% ostial L ICA stenosis. Medical management.  . Abdominal aortogram  09/01/2007    Widely patent renal arteries. Medical management.  . Peripheral vascular angiogram  05/07/2011    No evidence of intracranial occlusions, stenosis, dissections,  or aneurysms seen  . Carotid doppler  06/09/2012    R Vertebral-known occluded vessel, R  Bulb/Proximal ICA-moderate to severe amount of plaque w/50-69% diameter reduction, L Subclavian-50-69% diameter reduction, L CEA-demonstrated increased velocities w/o evidence of hemodynamically significant stenosis  . Cardiac catheterization  10/17/2001    No significant CAD, normal LV systolic function.  . Carotid endarterectomy Left July 2005  . Lower extremity angiogram Bilateral 11/01/2014    Procedure: LOWER EXTREMITY ANGIOGRAM;  Surgeon: [name], MD;  Location: [facility] CATH LAB;  Service: Cardiovascular;  Laterality: Bilateral;  . Abdominal angiogram  11/01/2014    Procedure: ABDOMINAL ANGIOGRAM;  Surgeon: [name], MD;  Location[facility] CATH LAB;  Service: Cardiovascular;;  . Knee surgery    . Hernia repair      Family History  Problem Relation Age of Onset  . Stroke Sister   . Heart disease Brother   . Diabetes Neg Hx     Social History:  reports that he has quit smoking. He has never used smokeless tobacco. He reports that he does not drink alcohol. His drug history is not on file.    Review of Systems    Lipid history: He has not been on any medications for lipids    Lab Results  Component Value Date   CHOL 139 06/22/2014   HDL 41 06/22/2014   LDLCALC 75 06/22/2014   TRIG 116 06/22/2014   CHOLHDL 3.4 06/22/2014           RENAL insufficiency: Last GFR is 12. This is apparently from hypertensive kidney disease and has had complete locations including anemia, secondary hyperparathyroidism managed with Procrit and calcitriol respectively  Lab Results  Component Value Date   CREATININE 4.66* 03/29/2015   BUN 107* 03/29/2015   NA 136 03/29/2015   K 3.4* 03/29/2015   CL 97* 03/29/2015   CO2 25 03/29/2015    Hypertension: Has had this for at least 30-40 years and has been difficult to control.  Followed by nephrologist.  He thinks blood pressure is recently better.  Currently on amlodipine, clonidine, hydralazine and Hytrin  Neurological:  Has no numbness,  burning, pains or tingling in feet.  May have coldness in his feet. Diabetic foot exam normal in 7/16   Physical Examination:  BP 178/48 mmHg  Pulse 79  Temp(Src) 98.5 F (36.9 C)  Resp 16  Ht  (1.676 m)  Wt 162 lb 3.2 oz (73.573 kg)  BMI 26.19 kg/m2  SpO2 94%        ASSESSMENT:  Diabetes type 2 with BMI 26 He appears to have fairly good blood sugar control with his A1c now 6.3%; however not clear how accurate his A1c is because of his advanced renal insufficiency.  Fructosamine is also reasonably good at 279 He has not had any consistently high readings and only a few readings after lunch that are high based on his carbohydrate intake Overall control is adequate considering his age and multiple medical problems.3 He is taking 25 mg Januvia but is appropriate for his renal function  PLAN:   He will continue to take Januvia 25 mg daily for now  He will need to work on modifying his carbohydrate intake and reducing simple sugars  Check blood sugars more regularly including postprandially  Follow-up in 3 months  High-dose seasonal influenza vaccine given  Patient Instructions  Check blood sugars on waking up .. 2 .. times a week Also check blood sugars about 2 hours after  a meal and do this after different meals by rotation  Recommended blood sugar levels on waking up is 90-130 and about 2 hours after meal is 140-180 Please bring blood sugar monitor to each visit.     KUMAR,AJAY 07/21/2015, 7:56 AM   Note: This office note was prepared with Dragon voice recognition system technology. Any transcriptional errors that result from this process are unintentional.

## 2015-08-03 ENCOUNTER — Ambulatory Visit (INDEPENDENT_AMBULATORY_CARE_PROVIDER_SITE_OTHER): Payer: Medicare Other | Admitting: Ophthalmology

## 2015-08-03 DIAGNOSIS — I10 Essential (primary) hypertension: Secondary | ICD-10-CM | POA: Diagnosis not present

## 2015-08-03 DIAGNOSIS — H43813 Vitreous degeneration, bilateral: Secondary | ICD-10-CM

## 2015-08-03 DIAGNOSIS — H35033 Hypertensive retinopathy, bilateral: Secondary | ICD-10-CM | POA: Diagnosis not present

## 2015-08-03 DIAGNOSIS — H353132 Nonexudative age-related macular degeneration, bilateral, intermediate dry stage: Secondary | ICD-10-CM | POA: Diagnosis not present

## 2015-09-07 ENCOUNTER — Other Ambulatory Visit: Payer: Self-pay | Admitting: Cardiovascular Disease

## 2015-09-08 ENCOUNTER — Encounter (HOSPITAL_COMMUNITY)
Admission: RE | Admit: 2015-09-08 | Discharge: 2015-09-08 | Disposition: A | Discharge status: Home / Self Care | Payer: Medicare Other | Source: Ambulatory Visit | Attending: Nephrology | Admitting: Nephrology

## 2015-09-08 DIAGNOSIS — N183 Chronic kidney disease, stage 3 (moderate): Secondary | ICD-10-CM | POA: Insufficient documentation

## 2015-09-08 DIAGNOSIS — D631 Anemia in chronic kidney disease: Secondary | ICD-10-CM | POA: Insufficient documentation

## 2015-09-08 LAB — POCT HEMOGLOBIN-HEMACUE: Hemoglobin: 6.4 g/dL — CL (ref 13.0–17.0)

## 2015-09-08 LAB — IRON AND TIBC
IRON: 52 ug/dL (ref 45–182)
SATURATION RATIOS: 16 % — AB (ref 17.9–39.5)
TIBC: 321 ug/dL (ref 250–450)
UIBC: 269 ug/dL

## 2015-09-08 LAB — FERRITIN: FERRITIN: 24 ng/mL (ref 24–336)

## 2015-09-08 MED ORDER — EPOETIN ALFA 20000 UNIT/ML IJ SOLN
INTRAMUSCULAR | Status: AC
  Administered 2015-09-08: 20000 [IU]
  Filled 2015-09-08: qty 1

## 2015-09-08 MED ORDER — EPOETIN ALFA 10000 UNIT/ML IJ SOLN
INTRAMUSCULAR | Status: AC
  Administered 2015-09-08: 10000 [IU]
  Filled 2015-09-08: qty 1

## 2015-09-08 MED ORDER — EPOETIN ALFA 40000 UNIT/ML IJ SOLN: 30000.0000 [IU] | INTRAMUSCULAR | Status: DC

## 2015-09-08 NOTE — Progress Notes (Signed)
Pt in for Procrit injection.  HGB 6.4 via hemocue.  Dr Deterding's office notified.

## 2015-09-08 NOTE — Progress Notes (Signed)
Dr Deterding's office returned call.  Since pt is asymptomatic, we are to give injection and reinforce to the pt the need and importance to return as scheduled.  Continue with weekly appointments.  Office to mail stool cards to the pt to check stools.  Pt voices understanding.

## 2015-09-11 ENCOUNTER — Inpatient Hospital Stay (HOSPITAL_COMMUNITY): Payer: Medicare Other

## 2015-09-11 ENCOUNTER — Inpatient Hospital Stay (HOSPITAL_COMMUNITY)
Admission: EM | Admit: 2015-09-11 | Discharge: 2015-09-24 | DRG: 871 | Disposition: A | Discharge status: Skilled Nursing Facility | Payer: Medicare Other | Attending: Internal Medicine | Admitting: Internal Medicine

## 2015-09-11 ENCOUNTER — Encounter (HOSPITAL_COMMUNITY): Payer: Self-pay | Admitting: *Deleted

## 2015-09-11 ENCOUNTER — Emergency Department (HOSPITAL_COMMUNITY): Payer: Medicare Other

## 2015-09-11 DIAGNOSIS — Z7982 Long term (current) use of aspirin: Secondary | ICD-10-CM | POA: Diagnosis not present

## 2015-09-11 DIAGNOSIS — A419 Sepsis, unspecified organism: Principal | ICD-10-CM | POA: Diagnosis present

## 2015-09-11 DIAGNOSIS — I214 Non-ST elevation (NSTEMI) myocardial infarction: Secondary | ICD-10-CM | POA: Diagnosis present

## 2015-09-11 DIAGNOSIS — R0602 Shortness of breath: Secondary | ICD-10-CM | POA: Diagnosis present

## 2015-09-11 DIAGNOSIS — R0600 Dyspnea, unspecified: Secondary | ICD-10-CM

## 2015-09-11 DIAGNOSIS — I2510 Atherosclerotic heart disease of native coronary artery without angina pectoris: Secondary | ICD-10-CM | POA: Diagnosis present

## 2015-09-11 DIAGNOSIS — D631 Anemia in chronic kidney disease: Secondary | ICD-10-CM | POA: Diagnosis present

## 2015-09-11 DIAGNOSIS — I129 Hypertensive chronic kidney disease with stage 1 through stage 4 chronic kidney disease, or unspecified chronic kidney disease: Secondary | ICD-10-CM | POA: Diagnosis not present

## 2015-09-11 DIAGNOSIS — E1165 Type 2 diabetes mellitus with hyperglycemia: Secondary | ICD-10-CM | POA: Diagnosis present

## 2015-09-11 DIAGNOSIS — E785 Hyperlipidemia, unspecified: Secondary | ICD-10-CM | POA: Diagnosis present

## 2015-09-11 DIAGNOSIS — Z79899 Other long term (current) drug therapy: Secondary | ICD-10-CM | POA: Diagnosis not present

## 2015-09-11 DIAGNOSIS — X58XXXA Exposure to other specified factors, initial encounter: Secondary | ICD-10-CM | POA: Diagnosis not present

## 2015-09-11 DIAGNOSIS — J189 Pneumonia, unspecified organism: Secondary | ICD-10-CM | POA: Diagnosis present

## 2015-09-11 DIAGNOSIS — E1121 Type 2 diabetes mellitus with diabetic nephropathy: Secondary | ICD-10-CM | POA: Diagnosis present

## 2015-09-11 DIAGNOSIS — Z7984 Long term (current) use of oral hypoglycemic drugs: Secondary | ICD-10-CM

## 2015-09-11 DIAGNOSIS — E876 Hypokalemia: Secondary | ICD-10-CM | POA: Diagnosis present

## 2015-09-11 DIAGNOSIS — N2581 Secondary hyperparathyroidism of renal origin: Secondary | ICD-10-CM | POA: Diagnosis not present

## 2015-09-11 DIAGNOSIS — I161 Hypertensive emergency: Secondary | ICD-10-CM | POA: Diagnosis present

## 2015-09-11 DIAGNOSIS — K922 Gastrointestinal hemorrhage, unspecified: Secondary | ICD-10-CM | POA: Diagnosis present

## 2015-09-11 DIAGNOSIS — I471 Supraventricular tachycardia: Secondary | ICD-10-CM | POA: Diagnosis present

## 2015-09-11 DIAGNOSIS — J9601 Acute respiratory failure with hypoxia: Secondary | ICD-10-CM | POA: Diagnosis present

## 2015-09-11 DIAGNOSIS — I169 Hypertensive crisis, unspecified: Secondary | ICD-10-CM | POA: Diagnosis present

## 2015-09-11 DIAGNOSIS — Z7902 Long term (current) use of antithrombotics/antiplatelets: Secondary | ICD-10-CM

## 2015-09-11 DIAGNOSIS — N189 Chronic kidney disease, unspecified: Secondary | ICD-10-CM

## 2015-09-11 DIAGNOSIS — S3730XA Unspecified injury of urethra, initial encounter: Secondary | ICD-10-CM | POA: Diagnosis not present

## 2015-09-11 DIAGNOSIS — J9600 Acute respiratory failure, unspecified whether with hypoxia or hypercapnia: Secondary | ICD-10-CM | POA: Diagnosis present

## 2015-09-11 DIAGNOSIS — R6521 Severe sepsis with septic shock: Secondary | ICD-10-CM | POA: Diagnosis present

## 2015-09-11 DIAGNOSIS — Z452 Encounter for adjustment and management of vascular access device: Secondary | ICD-10-CM

## 2015-09-11 DIAGNOSIS — E1122 Type 2 diabetes mellitus with diabetic chronic kidney disease: Secondary | ICD-10-CM | POA: Diagnosis present

## 2015-09-11 DIAGNOSIS — I509 Heart failure, unspecified: Secondary | ICD-10-CM

## 2015-09-11 DIAGNOSIS — D500 Iron deficiency anemia secondary to blood loss (chronic): Secondary | ICD-10-CM | POA: Diagnosis not present

## 2015-09-11 DIAGNOSIS — J810 Acute pulmonary edema: Secondary | ICD-10-CM | POA: Diagnosis not present

## 2015-09-11 DIAGNOSIS — Z87891 Personal history of nicotine dependence: Secondary | ICD-10-CM

## 2015-09-11 DIAGNOSIS — Z66 Do not resuscitate: Secondary | ICD-10-CM | POA: Diagnosis not present

## 2015-09-11 DIAGNOSIS — N184 Chronic kidney disease, stage 4 (severe): Secondary | ICD-10-CM | POA: Diagnosis present

## 2015-09-11 DIAGNOSIS — D649 Anemia, unspecified: Secondary | ICD-10-CM | POA: Diagnosis not present

## 2015-09-11 DIAGNOSIS — N170 Acute kidney failure with tubular necrosis: Secondary | ICD-10-CM | POA: Diagnosis not present

## 2015-09-11 DIAGNOSIS — E1140 Type 2 diabetes mellitus with diabetic neuropathy, unspecified: Secondary | ICD-10-CM | POA: Diagnosis present

## 2015-09-11 DIAGNOSIS — I5031 Acute diastolic (congestive) heart failure: Secondary | ICD-10-CM | POA: Diagnosis not present

## 2015-09-11 DIAGNOSIS — M109 Gout, unspecified: Secondary | ICD-10-CM | POA: Diagnosis present

## 2015-09-11 DIAGNOSIS — Z978 Presence of other specified devices: Secondary | ICD-10-CM

## 2015-09-11 DIAGNOSIS — J440 Chronic obstructive pulmonary disease with acute lower respiratory infection: Secondary | ICD-10-CM | POA: Diagnosis present

## 2015-09-11 DIAGNOSIS — I5033 Acute on chronic diastolic (congestive) heart failure: Secondary | ICD-10-CM | POA: Diagnosis present

## 2015-09-11 DIAGNOSIS — R5381 Other malaise: Secondary | ICD-10-CM | POA: Diagnosis not present

## 2015-09-11 DIAGNOSIS — J9690 Respiratory failure, unspecified, unspecified whether with hypoxia or hypercapnia: Secondary | ICD-10-CM | POA: Insufficient documentation

## 2015-09-11 DIAGNOSIS — T8249XA Other complication of vascular dialysis catheter, initial encounter: Secondary | ICD-10-CM

## 2015-09-11 DIAGNOSIS — Z4659 Encounter for fitting and adjustment of other gastrointestinal appliance and device: Secondary | ICD-10-CM

## 2015-09-11 DIAGNOSIS — I130 Hypertensive heart and chronic kidney disease with heart failure and stage 1 through stage 4 chronic kidney disease, or unspecified chronic kidney disease: Secondary | ICD-10-CM | POA: Diagnosis present

## 2015-09-11 DIAGNOSIS — N179 Acute kidney failure, unspecified: Secondary | ICD-10-CM | POA: Diagnosis not present

## 2015-09-11 DIAGNOSIS — IMO0002 Reserved for concepts with insufficient information to code with codable children: Secondary | ICD-10-CM | POA: Diagnosis present

## 2015-09-11 DIAGNOSIS — D62 Acute posthemorrhagic anemia: Secondary | ICD-10-CM | POA: Diagnosis present

## 2015-09-11 DIAGNOSIS — R34 Anuria and oliguria: Secondary | ICD-10-CM | POA: Diagnosis not present

## 2015-09-11 DIAGNOSIS — R195 Other fecal abnormalities: Secondary | ICD-10-CM | POA: Diagnosis not present

## 2015-09-11 DIAGNOSIS — R319 Hematuria, unspecified: Secondary | ICD-10-CM | POA: Diagnosis not present

## 2015-09-11 DIAGNOSIS — I5041 Acute combined systolic (congestive) and diastolic (congestive) heart failure: Secondary | ICD-10-CM | POA: Diagnosis not present

## 2015-09-11 DIAGNOSIS — H5702 Anisocoria: Secondary | ICD-10-CM

## 2015-09-11 DIAGNOSIS — I5021 Acute systolic (congestive) heart failure: Secondary | ICD-10-CM | POA: Diagnosis not present

## 2015-09-11 DIAGNOSIS — E46 Unspecified protein-calorie malnutrition: Secondary | ICD-10-CM | POA: Diagnosis present

## 2015-09-11 DIAGNOSIS — E872 Acidosis, unspecified: Secondary | ICD-10-CM | POA: Insufficient documentation

## 2015-09-11 DIAGNOSIS — Z6825 Body mass index (BMI) 25.0-25.9, adult: Secondary | ICD-10-CM

## 2015-09-11 DIAGNOSIS — E7800 Pure hypercholesterolemia, unspecified: Secondary | ICD-10-CM | POA: Diagnosis present

## 2015-09-11 DIAGNOSIS — I160 Hypertensive urgency: Secondary | ICD-10-CM | POA: Diagnosis present

## 2015-09-11 LAB — CBC WITH DIFFERENTIAL/PLATELET
BASOS ABS: 0 10*3/uL (ref 0.0–0.1)
Basophils Absolute: 0 10*3/uL (ref 0.0–0.1)
Basophils Relative: 0 %
Basophils Relative: 0 %
EOS ABS: 0 10*3/uL (ref 0.0–0.7)
Eosinophils Absolute: 0 10*3/uL (ref 0.0–0.7)
Eosinophils Relative: 0 %
Eosinophils Relative: 0 %
HEMATOCRIT: 22.6 % — AB (ref 39.0–52.0)
HEMATOCRIT: 25 % — AB (ref 39.0–52.0)
HEMOGLOBIN: 7.5 g/dL — AB (ref 13.0–17.0)
Hemoglobin: 8.3 g/dL — ABNORMAL LOW (ref 13.0–17.0)
LYMPHS ABS: 0.9 10*3/uL (ref 0.7–4.0)
LYMPHS PCT: 3 %
Lymphocytes Relative: 4 %
Lymphs Abs: 0.5 10*3/uL — ABNORMAL LOW (ref 0.7–4.0)
MCH: 31.3 pg (ref 26.0–34.0)
MCH: 30.4 pg (ref 26.0–34.0)
MCHC: 33.2 g/dL (ref 30.0–36.0)
MCHC: 33.2 g/dL (ref 30.0–36.0)
MCV: 94.2 fL (ref 78.0–100.0)
MCV: 91.6 fL (ref 78.0–100.0)
MONO ABS: 0.3 10*3/uL (ref 0.1–1.0)
MONOS PCT: 7 %
Monocytes Absolute: 1.4 10*3/uL — ABNORMAL HIGH (ref 0.1–1.0)
Monocytes Relative: 2 %
NEUTROS ABS: 19 10*3/uL — AB (ref 1.7–7.7)
NEUTROS ABS: 18.5 10*3/uL — AB (ref 1.7–7.7)
NEUTROS PCT: 89 %
Neutrophils Relative %: 95 %
PLATELETS: 195 10*3/uL (ref 150–400)
Platelets: 216 10*3/uL (ref 150–400)
RBC: 2.4 MIL/uL — ABNORMAL LOW (ref 4.22–5.81)
RBC: 2.73 MIL/uL — ABNORMAL LOW (ref 4.22–5.81)
RDW: 18.3 % — ABNORMAL HIGH (ref 11.5–15.5)
RDW: 17.4 % — AB (ref 11.5–15.5)
WBC: 21.3 10*3/uL — ABNORMAL HIGH (ref 4.0–10.5)
WBC: 19.3 10*3/uL — ABNORMAL HIGH (ref 4.0–10.5)

## 2015-09-11 LAB — I-STAT ARTERIAL BLOOD GAS, ED
ACID-BASE DEFICIT: 4 mmol/L — AB (ref 0.0–2.0)
BICARBONATE: 19.6 meq/L — AB (ref 20.0–24.0)
O2 Saturation: 97 %
TCO2: 21 mmol/L (ref 0–100)
pCO2 arterial: 31 mmHg — ABNORMAL LOW (ref 35.0–45.0)
pH, Arterial: 7.408 (ref 7.350–7.450)
pO2, Arterial: 84 mmHg (ref 80.0–100.0)

## 2015-09-11 LAB — COMPREHENSIVE METABOLIC PANEL
ALBUMIN: 3.1 g/dL — AB (ref 3.5–5.0)
ALBUMIN: 3 g/dL — AB (ref 3.5–5.0)
ALK PHOS: 82 U/L (ref 38–126)
ALT: 22 U/L (ref 17–63)
ALT: 23 U/L (ref 17–63)
ANION GAP: 17 — AB (ref 5–15)
AST: 73 U/L — AB (ref 15–41)
AST: 71 U/L — AB (ref 15–41)
Alkaline Phosphatase: 84 U/L (ref 38–126)
Anion gap: 19 — ABNORMAL HIGH (ref 5–15)
BILIRUBIN TOTAL: 1.3 mg/dL — AB (ref 0.3–1.2)
BILIRUBIN TOTAL: 1 mg/dL (ref 0.3–1.2)
BUN: 102 mg/dL — AB (ref 6–20)
BUN: 106 mg/dL — AB (ref 6–20)
CALCIUM: 8.9 mg/dL (ref 8.9–10.3)
CHLORIDE: 101 mmol/L (ref 101–111)
CO2: 21 mmol/L — ABNORMAL LOW (ref 22–32)
CO2: 22 mmol/L (ref 22–32)
CREATININE: 3.82 mg/dL — AB (ref 0.61–1.24)
Calcium: 8.8 mg/dL — ABNORMAL LOW (ref 8.9–10.3)
Chloride: 102 mmol/L (ref 101–111)
Creatinine, Ser: 3.84 mg/dL — ABNORMAL HIGH (ref 0.61–1.24)
GFR calc Af Amer: 16 mL/min — ABNORMAL LOW (ref >60)
GFR calc Af Amer: 15 mL/min — ABNORMAL LOW (ref >60)
GFR calc non Af Amer: 13 mL/min — ABNORMAL LOW (ref >60)
GFR calc non Af Amer: 13 mL/min — ABNORMAL LOW (ref >60)
GLUCOSE: 247 mg/dL — AB (ref 65–99)
GLUCOSE: 369 mg/dL — AB (ref 65–99)
POTASSIUM: 3.1 mmol/L — AB (ref 3.5–5.1)
POTASSIUM: 2.7 mmol/L — AB (ref 3.5–5.1)
SODIUM: 140 mmol/L (ref 135–145)
Sodium: 142 mmol/L (ref 135–145)
TOTAL PROTEIN: 5.8 g/dL — AB (ref 6.5–8.1)
TOTAL PROTEIN: 6.4 g/dL — AB (ref 6.5–8.1)

## 2015-09-11 LAB — URINALYSIS, ROUTINE W REFLEX MICROSCOPIC
BILIRUBIN URINE: NEGATIVE
Glucose, UA: NEGATIVE mg/dL
Hgb urine dipstick: NEGATIVE
KETONES UR: NEGATIVE mg/dL
LEUKOCYTES UA: NEGATIVE
NITRITE: NEGATIVE
PH: 5 (ref 5.0–8.0)
PROTEIN: 100 mg/dL — AB
Specific Gravity, Urine: 1.015 (ref 1.005–1.030)
UROBILINOGEN UA: 0.2 mg/dL (ref 0.0–1.0)

## 2015-09-11 LAB — TROPONIN I
TROPONIN I: 6.15 ng/mL — AB (ref <0.031)
TROPONIN I: 11.37 ng/mL — AB (ref <0.031)

## 2015-09-11 LAB — POCT I-STAT 3, ART BLOOD GAS (G3+)
Acid-Base Excess: 3 mmol/L — ABNORMAL HIGH (ref 0.0–2.0)
BICARBONATE: 29.4 meq/L — AB (ref 20.0–24.0)
O2 Saturation: 100 %
PCO2 ART: 57.1 mmHg — AB (ref 35.0–45.0)
PO2 ART: 302 mmHg — AB (ref 80.0–100.0)
Patient temperature: 98.6
TCO2: 31 mmol/L (ref 0–100)
pH, Arterial: 7.319 — ABNORMAL LOW (ref 7.350–7.450)

## 2015-09-11 LAB — I-STAT CG4 LACTIC ACID, ED: Lactic Acid, Venous: 6.69 mmol/L (ref 0.5–2.0)

## 2015-09-11 LAB — LACTIC ACID, PLASMA
Lactic Acid, Venous: 5 mmol/L (ref 0.5–2.0)
Lactic Acid, Venous: 2.3 mmol/L (ref 0.5–2.0)

## 2015-09-11 LAB — BRAIN NATRIURETIC PEPTIDE: B Natriuretic Peptide: 2200.4 pg/mL — ABNORMAL HIGH (ref 0.0–100.0)

## 2015-09-11 LAB — GLUCOSE, CAPILLARY
GLUCOSE-CAPILLARY: 316 mg/dL — AB (ref 65–99)
GLUCOSE-CAPILLARY: 322 mg/dL — AB (ref 65–99)

## 2015-09-11 LAB — I-STAT TROPONIN, ED: Troponin i, poc: 7.08 ng/mL (ref 0.00–0.08)

## 2015-09-11 LAB — I-STAT CHEM 8, ED
BUN: 98 mg/dL — ABNORMAL HIGH (ref 6–20)
CALCIUM ION: 1.04 mmol/L — AB (ref 1.13–1.30)
CHLORIDE: 104 mmol/L (ref 101–111)
CREATININE: 3.5 mg/dL — AB (ref 0.61–1.24)
GLUCOSE: 238 mg/dL — AB (ref 65–99)
HCT: 21 % — ABNORMAL LOW (ref 39.0–52.0)
Hemoglobin: 7.1 g/dL — ABNORMAL LOW (ref 13.0–17.0)
POTASSIUM: 3.2 mmol/L — AB (ref 3.5–5.1)
Sodium: 141 mmol/L (ref 135–145)
TCO2: 21 mmol/L (ref 0–100)

## 2015-09-11 LAB — URINE MICROSCOPIC-ADD ON

## 2015-09-11 LAB — MRSA PCR SCREENING: MRSA by PCR: NEGATIVE

## 2015-09-11 LAB — MAGNESIUM: MAGNESIUM: 2.6 mg/dL — AB (ref 1.7–2.4)

## 2015-09-11 LAB — PROTIME-INR
INR: 1.25 (ref 0.00–1.49)
INR: 1.35 (ref 0.00–1.49)
PROTHROMBIN TIME: 15.8 s — AB (ref 11.6–15.2)
Prothrombin Time: 16.8 seconds — ABNORMAL HIGH (ref 11.6–15.2)

## 2015-09-11 LAB — APTT: APTT: 31 s (ref 24–37)

## 2015-09-11 LAB — I-STAT VENOUS BLOOD GAS, ED
ACID-BASE DEFICIT: 1 mmol/L (ref 0.0–2.0)
BICARBONATE: 24 meq/L (ref 20.0–24.0)
O2 Saturation: 88 %
TCO2: 25 mmol/L (ref 0–100)
pCO2, Ven: 37.8 mmHg — ABNORMAL LOW (ref 45.0–50.0)
pH, Ven: 7.411 — ABNORMAL HIGH (ref 7.250–7.300)
pO2, Ven: 54 mmHg — ABNORMAL HIGH (ref 30.0–45.0)

## 2015-09-11 LAB — ABO/RH: ABO/RH(D): B POS

## 2015-09-11 LAB — POC OCCULT BLOOD, ED: FECAL OCCULT BLD: POSITIVE — AB

## 2015-09-11 LAB — PROCALCITONIN
PROCALCITONIN: 5.11 ng/mL
Procalcitonin: 5.48 ng/mL

## 2015-09-11 LAB — STREP PNEUMONIAE URINARY ANTIGEN: Strep Pneumo Urinary Antigen: NEGATIVE

## 2015-09-11 LAB — PREPARE RBC (CROSSMATCH)

## 2015-09-11 LAB — CBG MONITORING, ED: GLUCOSE-CAPILLARY: 227 mg/dL — AB (ref 65–99)

## 2015-09-11 MED ORDER — DEXTROSE 5 % IV SOLN
1.0000 g | INTRAVENOUS | Status: DC
  Administered 2015-09-12 – 2015-09-16 (×5): 1 g via INTRAVENOUS
  Filled 2015-09-11 (×6): qty 10

## 2015-09-11 MED ORDER — DEXTROSE 5 % IV SOLN
500.0000 mg | INTRAVENOUS | Status: AC
  Administered 2015-09-12 – 2015-09-17 (×6): 500 mg via INTRAVENOUS
  Filled 2015-09-11 (×6): qty 500

## 2015-09-11 MED ORDER — SODIUM CHLORIDE 0.9 % IV SOLN
10.0000 mL/h | Freq: Once | INTRAVENOUS | Status: AC
  Administered 2015-09-11: 10 mL/h via INTRAVENOUS

## 2015-09-11 MED ORDER — PANTOPRAZOLE SODIUM 40 MG IV SOLR
80.0000 mg | Freq: Once | INTRAVENOUS | Status: AC
  Administered 2015-09-11: 80 mg via INTRAVENOUS
  Filled 2015-09-11: qty 80

## 2015-09-11 MED ORDER — SUCCINYLCHOLINE CHLORIDE 20 MG/ML IJ SOLN
INTRAMUSCULAR | Status: AC
  Filled 2015-09-11: qty 1

## 2015-09-11 MED ORDER — DEXTROSE 5 % IV SOLN: 1.0000 g | Freq: Once | INTRAVENOUS | Status: DC

## 2015-09-11 MED ORDER — ASPIRIN 81 MG PO CHEW
324.0000 mg | CHEWABLE_TABLET | Freq: Once | ORAL | Status: AC
  Administered 2015-09-11: 324 mg via ORAL
  Filled 2015-09-11: qty 4

## 2015-09-11 MED ORDER — ROCURONIUM BROMIDE 50 MG/5ML IV SOLN
INTRAVENOUS | Status: AC
  Administered 2015-09-11: 50 mg
  Filled 2015-09-11: qty 2

## 2015-09-11 MED ORDER — CLONIDINE HCL 0.1 MG PO TABS
0.1000 mg | ORAL_TABLET | Freq: Three times a day (TID) | ORAL | Status: DC
  Administered 2015-09-11 – 2015-09-18 (×12): 0.1 mg
  Filled 2015-09-11 (×12): qty 1

## 2015-09-11 MED ORDER — ASPIRIN EC 81 MG PO TBEC: 81.0000 mg | DELAYED_RELEASE_TABLET | Freq: Every evening | ORAL | Status: DC

## 2015-09-11 MED ORDER — PANTOPRAZOLE SODIUM 40 MG IV SOLR
40.0000 mg | Freq: Two times a day (BID) | INTRAVENOUS | Status: DC
  Administered 2015-09-11 – 2015-09-16 (×11): 40 mg via INTRAVENOUS
  Filled 2015-09-11 (×11): qty 40

## 2015-09-11 MED ORDER — ACETAMINOPHEN 325 MG PO TABS: 650.0000 mg | ORAL_TABLET | ORAL | Status: DC | PRN

## 2015-09-11 MED ORDER — ETOMIDATE 2 MG/ML IV SOLN
INTRAVENOUS | Status: AC
  Administered 2015-09-11: 20 mg
  Filled 2015-09-11: qty 20

## 2015-09-11 MED ORDER — POTASSIUM CHLORIDE 20 MEQ/15ML (10%) PO SOLN
30.0000 meq | Freq: Once | ORAL | Status: AC
Start: 2015-09-11 — End: 2015-09-11
  Administered 2015-09-11: 30 meq via ORAL
  Filled 2015-09-11: qty 30

## 2015-09-11 MED ORDER — DEXTROSE 5 % IV SOLN: 500.0000 mg | Freq: Once | INTRAVENOUS | Status: DC

## 2015-09-11 MED ORDER — FENTANYL CITRATE (PF) 100 MCG/2ML IJ SOLN
100.0000 ug | INTRAMUSCULAR | Status: DC | PRN
  Administered 2015-09-11: 100 ug via INTRAVENOUS
  Filled 2015-09-11: qty 2

## 2015-09-11 MED ORDER — NITROGLYCERIN IN D5W 200-5 MCG/ML-% IV SOLN
0.0000 ug/min | INTRAVENOUS | Status: DC
  Administered 2015-09-11: 5 ug/min via INTRAVENOUS
  Filled 2015-09-11: qty 250

## 2015-09-11 MED ORDER — INSULIN ASPART 100 UNIT/ML ~~LOC~~ SOLN: 0.0000 [IU] | Freq: Every day | SUBCUTANEOUS | Status: DC

## 2015-09-11 MED ORDER — MIDAZOLAM HCL 2 MG/2ML IJ SOLN
INTRAMUSCULAR | Status: AC
  Administered 2015-09-11: 2 mg
  Filled 2015-09-11: qty 2

## 2015-09-11 MED ORDER — SODIUM CHLORIDE 0.9 % IV SOLN: 250.0000 mL | INTRAVENOUS | Status: DC | PRN

## 2015-09-11 MED ORDER — INSULIN ASPART 100 UNIT/ML ~~LOC~~ SOLN
0.0000 [IU] | SUBCUTANEOUS | Status: DC
  Administered 2015-09-11 (×2): 7 [IU] via SUBCUTANEOUS
  Administered 2015-09-12: 1 [IU] via SUBCUTANEOUS
  Administered 2015-09-12 (×2): 2 [IU] via SUBCUTANEOUS
  Administered 2015-09-12: 3 [IU] via SUBCUTANEOUS
  Administered 2015-09-12: 2 [IU] via SUBCUTANEOUS
  Administered 2015-09-13 (×2): 1 [IU] via SUBCUTANEOUS
  Administered 2015-09-13 (×2): 2 [IU] via SUBCUTANEOUS
  Administered 2015-09-13 (×2): 1 [IU] via SUBCUTANEOUS
  Administered 2015-09-14: 2 [IU] via SUBCUTANEOUS
  Administered 2015-09-14 – 2015-09-16 (×7): 1 [IU] via SUBCUTANEOUS
  Administered 2015-09-17: 2 [IU] via SUBCUTANEOUS
  Administered 2015-09-17 (×2): 1 [IU] via SUBCUTANEOUS
  Administered 2015-09-17: 2 [IU] via SUBCUTANEOUS
  Administered 2015-09-17 – 2015-09-18 (×3): 1 [IU] via SUBCUTANEOUS

## 2015-09-11 MED ORDER — TERAZOSIN HCL 5 MG PO CAPS
10.0000 mg | ORAL_CAPSULE | Freq: Every day | ORAL | Status: DC
  Administered 2015-09-11 – 2015-09-17 (×5): 10 mg
  Filled 2015-09-11 (×8): qty 2

## 2015-09-11 MED ORDER — POTASSIUM CHLORIDE CRYS ER 20 MEQ PO TBCR
30.0000 meq | EXTENDED_RELEASE_TABLET | ORAL | Status: DC
  Administered 2015-09-11: 30 meq via ORAL
  Filled 2015-09-11 (×4): qty 1

## 2015-09-11 MED ORDER — SODIUM CHLORIDE 0.9 % IV SOLN
25.0000 ug/h | INTRAVENOUS | Status: DC
  Administered 2015-09-11: 25 ug/h via INTRAVENOUS
  Filled 2015-09-11 (×2): qty 50

## 2015-09-11 MED ORDER — TERAZOSIN HCL 5 MG PO CAPS
10.0000 mg | ORAL_CAPSULE | Freq: Every day | ORAL | Status: DC
  Filled 2015-09-11: qty 2

## 2015-09-11 MED ORDER — FENTANYL CITRATE (PF) 100 MCG/2ML IJ SOLN
INTRAMUSCULAR | Status: AC
  Administered 2015-09-11: 100 ug
  Filled 2015-09-11: qty 2

## 2015-09-11 MED ORDER — HYDRALAZINE HCL 50 MG PO TABS
50.0000 mg | ORAL_TABLET | Freq: Three times a day (TID) | ORAL | Status: DC
  Filled 2015-09-11 (×2): qty 1

## 2015-09-11 MED ORDER — ASPIRIN 81 MG PO CHEW
81.0000 mg | CHEWABLE_TABLET | Freq: Once | ORAL | Status: AC
  Administered 2015-09-11: 81 mg
  Filled 2015-09-11: qty 1

## 2015-09-11 MED ORDER — INSULIN ASPART 100 UNIT/ML ~~LOC~~ SOLN: 0.0000 [IU] | Freq: Three times a day (TID) | SUBCUTANEOUS | Status: DC

## 2015-09-11 MED ORDER — ANTISEPTIC ORAL RINSE SOLUTION (CORINZ)
7.0000 mL | Freq: Four times a day (QID) | OROMUCOSAL | Status: DC
Start: 2015-09-11 — End: 2015-09-12
  Administered 2015-09-11 – 2015-09-12 (×2): 7 mL via OROMUCOSAL

## 2015-09-11 MED ORDER — CLONIDINE HCL 0.1 MG PO TABS: 0.1000 mg | ORAL_TABLET | Freq: Three times a day (TID) | ORAL | Status: DC

## 2015-09-11 MED ORDER — CHLORHEXIDINE GLUCONATE 0.12% ORAL RINSE (MEDLINE KIT)
15.0000 mL | Freq: Two times a day (BID) | OROMUCOSAL | Status: DC
  Administered 2015-09-11 – 2015-09-18 (×13): 15 mL via OROMUCOSAL

## 2015-09-11 MED ORDER — METOLAZONE 5 MG PO TABS
5.0000 mg | ORAL_TABLET | Freq: Once | ORAL | Status: AC
Start: 2015-09-11 — End: 2015-09-11
  Administered 2015-09-11: 5 mg via ORAL
  Filled 2015-09-11: qty 1

## 2015-09-11 MED ORDER — ONDANSETRON HCL 4 MG/2ML IJ SOLN: 4.0000 mg | Freq: Four times a day (QID) | INTRAMUSCULAR | Status: DC | PRN

## 2015-09-11 MED ORDER — HYDRALAZINE HCL 50 MG PO TABS
50.0000 mg | ORAL_TABLET | Freq: Three times a day (TID) | ORAL | Status: DC
  Administered 2015-09-11 – 2015-09-12 (×5): 50 mg
  Filled 2015-09-11 (×5): qty 1

## 2015-09-11 MED ORDER — METOPROLOL TARTRATE 1 MG/ML IV SOLN
2.5000 mg | INTRAVENOUS | Status: DC
  Administered 2015-09-11 – 2015-09-12 (×2): 2.5 mg via INTRAVENOUS
  Filled 2015-09-11 (×2): qty 5

## 2015-09-11 MED ORDER — SODIUM CHLORIDE 0.9 % IJ SOLN: 3.0000 mL | INTRAMUSCULAR | Status: DC | PRN

## 2015-09-11 MED ORDER — MORPHINE SULFATE (PF) 2 MG/ML IV SOLN: 2.0000 mg | Freq: Once | INTRAVENOUS | Status: DC

## 2015-09-11 MED ORDER — SODIUM CHLORIDE 0.9 % IJ SOLN: 3.0000 mL | Freq: Two times a day (BID) | INTRAMUSCULAR | Status: DC

## 2015-09-11 MED ORDER — ENOXAPARIN SODIUM 30 MG/0.3ML ~~LOC~~ SOLN
30.0000 mg | SUBCUTANEOUS | Status: DC
  Filled 2015-09-11 (×2): qty 0.3

## 2015-09-11 MED ORDER — DEXTROSE 5 % IV SOLN
500.0000 mg | Freq: Once | INTRAVENOUS | Status: AC
  Administered 2015-09-11: 500 mg via INTRAVENOUS
  Filled 2015-09-11: qty 500

## 2015-09-11 MED ORDER — FUROSEMIDE 10 MG/ML IJ SOLN
120.0000 mg | Freq: Once | INTRAVENOUS | Status: AC
  Administered 2015-09-11: 120 mg via INTRAVENOUS
  Filled 2015-09-11: qty 12

## 2015-09-11 MED ORDER — POTASSIUM CHLORIDE CRYS ER 20 MEQ PO TBCR: 40.0000 meq | EXTENDED_RELEASE_TABLET | Freq: Two times a day (BID) | ORAL | Status: DC

## 2015-09-11 MED ORDER — LIDOCAINE HCL (CARDIAC) 20 MG/ML IV SOLN
INTRAVENOUS | Status: AC
  Filled 2015-09-11: qty 5

## 2015-09-11 MED ORDER — CLOPIDOGREL BISULFATE 75 MG PO TABS: 75.0000 mg | ORAL_TABLET | Freq: Every day | ORAL | Status: DC

## 2015-09-11 MED ORDER — CLOPIDOGREL BISULFATE 75 MG PO TABS
75.0000 mg | ORAL_TABLET | Freq: Every day | ORAL | Status: DC
  Administered 2015-09-12 – 2015-09-18 (×4): 75 mg
  Filled 2015-09-11 (×5): qty 1

## 2015-09-11 MED ORDER — ENOXAPARIN SODIUM 40 MG/0.4ML ~~LOC~~ SOLN: 40.0000 mg | SUBCUTANEOUS | Status: DC

## 2015-09-11 MED ORDER — NITROGLYCERIN 0.4 MG SL SUBL: 0.4000 mg | SUBLINGUAL_TABLET | SUBLINGUAL | Status: DC | PRN

## 2015-09-11 MED ORDER — IPRATROPIUM-ALBUTEROL 0.5-2.5 (3) MG/3ML IN SOLN
3.0000 mL | Freq: Four times a day (QID) | RESPIRATORY_TRACT | Status: DC
  Administered 2015-09-12 – 2015-09-18 (×27): 3 mL via RESPIRATORY_TRACT
  Filled 2015-09-11 (×27): qty 3

## 2015-09-11 MED ORDER — DEXTROSE 5 % IV SOLN
1.0000 g | Freq: Once | INTRAVENOUS | Status: AC
  Administered 2015-09-11: 1 g via INTRAVENOUS
  Filled 2015-09-11: qty 10

## 2015-09-11 MED ORDER — FUROSEMIDE 10 MG/ML IJ SOLN
60.0000 mg | Freq: Once | INTRAMUSCULAR | Status: AC
  Administered 2015-09-11: 60 mg via INTRAVENOUS
  Filled 2015-09-11: qty 6

## 2015-09-11 NOTE — Procedures (Signed)
Central Venous Catheter Insertion Procedure Note [name] [id number] 03/16/1930  Procedure: Insertion of Central Venous Catheter Indications: Drug and/or fluid administration  Procedure Details Consent: Risks of procedure as well as the alternatives and risks of each were explained to the (patient/caregiver).  Consent for procedure obtained. Time Out: Verified patient identification, verified procedure, site/side was marked, verified correct patient position, special equipment/implants available, medications/allergies/relevent history reviewed, required imaging and test results available.  Performed  Maximum sterile technique was used including antiseptics, cap, gloves, gown, hand hygiene, mask and sheet. Skin prep: Chlorhexidine; local anesthetic administered A antimicrobial bonded/coated triple lumen catheter was placed in the left subclavian vein using the Seldinger technique.  Evaluation Blood flow good Complications: No apparent complications Patient did tolerate procedure well. Chest X-ray ordered to verify placement.  CXR: pending.  U/S used in placement.  [name] 09/11/2015, 4:46 PM

## 2015-09-11 NOTE — Progress Notes (Signed)
CRITICAL VALUE ALERT  Critical value received:  K  2.7, Trop 6.15  Date of notification:  09/11/15  Time of notification:  1728  Critical value read back:Yes.    Nurse who received alert:  Melonie Floridaraci Taquita Demby, RN  MD notified (1st page):  Anders SimmondsPete Babcock, NP  Time of first page:  1728  Responding MD:  Anders SimmondsPete Babcock, NP  Time MD responded:  [phone]

## 2015-09-11 NOTE — ED Notes (Signed)
Pt is having runs of PVC'S into the 150's HR. Pt placed on zoll.

## 2015-09-11 NOTE — ED Notes (Signed)
Attempted report to 2H 

## 2015-09-11 NOTE — Progress Notes (Signed)
Paged oncall Cardiology fellow Dr.[place] to make aware of pt increase in troponin.

## 2015-09-11 NOTE — Progress Notes (Addendum)
eLink Physician-Brief Progress Note Patient Name: [name] DOB: 11/07/1929 MRN: [id number]   Date of Service  09/11/2015  HPI/Events of Note  Needs dvt prophylaxis but stool guiac positive  eICU Interventions  D/c lovenox  Rx PAS      Intervention Category Intermediate Interventions: Best-practice therapies (e.g. DVT, beta blocker, etc.)  Sandrea HughsMichael Leanord Thibeau 09/11/2015, 6:03 PM

## 2015-09-11 NOTE — Progress Notes (Signed)
Reported critical troponin of 11.37 to [facility] Sherene SiresWert. He reviewed the chart. ASA given and serial troponin to follow.

## 2015-09-11 NOTE — Progress Notes (Signed)
eLink Physician-Brief Progress Note Patient Name: [name] C Granados DOB: 12/31/1929 MRN: [id number]   Date of Service  09/11/2015  HPI/Events of Note  troponins pos and trending up / pt sedated on vent  eICU Interventions  Check ekg/ add lopressor 2.5 mg q4 h(aware of "allergy" to labetalol = swelling ) and notify cards but doubt candidate for intervention with cri      Intervention Category Major Interventions: Other:  Sandrea HughsMichael Everlie Eble 09/11/2015, 10:13 PM

## 2015-09-11 NOTE — Progress Notes (Signed)
Pharmacy Consult for Rocephin/Azithromycin and LMWH For CAP and VTE px. Wt 70 kg, creat 3.5, creat cl < 30 ml/min. First doses of rocephin and azithromycin already given in ED. Plan: Rocephin 1 gm IV q24 for CAP Azithromycin 500 mg q24 for CAP - please change to po when able LMWH 30 q24 for VTE px w/ creat cl < 30  Pharmacy to sign off Thanks Bryan AbrahamMichelle T. Eudora Duran, Pharm.D. [phone] 09/11/2015 12:18 PM

## 2015-09-11 NOTE — ED Notes (Signed)
Pt arrives from home via [facility]. Pt states he began having SOB last night and wasn't able to lay down and still breathe. Pt is extremely labored upon arrival and is using accessory muscles. Pt has bilateral coarse crackles. Pt states he is in "borderline kidney failure" and quit smoking 30 years ago. Upon arrival pt was 80% on room air. Pt received 2 duonebs PTA. Pt states he doesn't feel a difference in his breathing after tx. Pt denies CHF and COPD. Pt states he has had some leg swelling recently and states he will allow for intubation for a brief amount of time, pt states he doesn't wish to be on a ventilator for weeks. Pt agrees to a BIPAP.

## 2015-09-11 NOTE — H&P (Signed)
[facility] History and Physical                                                                                    [name], is a 81 y.o. male  MRN: [id number]   DOB - [birth year]  Admit Date - 09/11/2015  Outpatient Primary MD for the patient is Mayra Neer, MD  Referring MD: Billy Fischer / ER  Consulting M.D: Watt Climes / GI ; Harrington Challenger / Cardiology  With History of -  Past Medical History  Diagnosis Date  . Diabetes mellitus   . Hypertension   . Hypercholesteremia   . Gout   . PVD (peripheral vascular disease) ([place]) 7/05    LCE, known 60% RICA 7/12  . CAD (coronary artery disease) 2002    non critical  . Macular degeneration   . Chronic renal disease, stage III   . Claudication ([place])   . Palpitations     PVCs and PSVT on Holter monitoring  . Bilateral carotid artery disease (HCC)     status post left carotid endarterectomy performed by Dr. Amedeo Plenty 05/12/04  . Anemia       Past Surgical History  Procedure Laterality Date  . 2d echocardiogram  03/31/2008    EF greater than 55%  . Cardiovascular stress test  06/30/2010    Nonischemic. Low risk  . Cerebral angiogram  04/03/2004    High-grade 80% ostial L ICA stenosis. Medical management.  . Abdominal aortogram  09/01/2007    Widely patent renal arteries. Medical management.  . Peripheral vascular angiogram  05/07/2011    No evidence of intracranial occlusions, stenosis, dissections, or aneurysms seen  . Carotid doppler  06/09/2012    R Vertebral-known occluded vessel, R Bulb/Proximal ICA-moderate to severe amount of plaque w/50-69% diameter reduction, L Subclavian-50-69% diameter reduction, L CEA-demonstrated increased velocities w/o evidence of hemodynamically significant stenosis  . Cardiac catheterization  10/17/2001    No significant CAD, normal LV systolic function.  . Carotid endarterectomy Left July 2005  . Lower extremity angiogram Bilateral 11/01/2014    Procedure: LOWER EXTREMITY ANGIOGRAM;  Surgeon: Lorretta Harp, MD;  Location[facility] C[facility] LAB;  Service: Cardiovascular;  Laterality: Bilateral;  . Abdominal angiogram  11/01/2014    Procedure: ABDOMINAL ANGIOGRAM;  Surgeon: Lorretta Harp, MD;  Location: [facility] CATH LAB;  Service: Cardiovascular;;  . Knee surgery    . Hernia repair      in for   Chief Complaint  Patient presents with  . Shortness of Breath     HPI This is a 81 year old male patient last admitted in May 2016 for cholecystitis and underwent cholecystostomy tube placement. Since discharge this tube has subsequently been removed and patient has remained asymptomatic. He also has a past medical history of hypertension, diabetes followed by Dr. Dwyane Dee as an outpatient, prior history of a symptom met a bradycardia, peripheral vascular disease, stage IV chronic kidney disease, anemia of chronic kidney disease on Procrit, and dyslipidemia. Patient reported to the ER regarding 2 days of shortness of breath. Patient went to nephrologist office on 11/20 for routine Procrit injection and via HemoCue hemoglobin was found to be 6.4 according to the  documentation patient was felt to be asymptomatic and was given his injection with office mailing stool cards to the patient to check Hemoccult and to keep follow-up appointments as scheduled. Unfortunately since that visit patient has developed shortness of breath and dyspnea on exertion. He's not had any cough or chest pain he's had subjective chills but no fevers. He reported some increase in lower extremity edema. Because of significant shortness of breath he presented to the ER today the EMS. Upon EMS arrival to the home patient had increased work of breathing with extremely labored effort and use of accessory muscles. On exam he was found to have bilateral coarse crackles. Pulse oximetry on room air was 80%. Patient received 2 DuoNeb's without any improvement in symptoms. BiPAP was initiated by EMS.  After arrival to the ER the patient continued to have some  work of breathing despite BiPAP but saturations were 100%, respirations were 24, pulse was tachycardic with ventricular rates alternating between 100 and sometimes as high as 150 bpm, primarily sinus rhythm although patient was also having frequent PVCs and runs of MAT. He was afebrile. Portable chest x-ray revealed bilateral mid to lower lung zone airspace opacities right greater than left that may reflect multifocal pneumonia versus pulmonary edema with pneumonia being favored. Laboratory data revealed hypokalemia, stable renal function with a BUN of 102 and a creatinine of 3.8, glucose elevated 247, magnesium 2.6, albumin low at 3.1, AST 73, total bilirubin 1.3, BNP was elevated at 2200, troponin elevated at 7.08 noting at baseline patient does not have chronically elevated troponin. Lactic acid was elevated at 6.69 and again despite having chronic kidney disease patient does not have chronically elevated lactic acid. Bedside telemetry patient has diffuse sagging ST segments but again is not complaining of any chest pain. In review old echocardiogram from May 2016 age and has evidence of moderate LVH and hyperdynamic EF of 75% without regional wall motion abnormalities and normal RV cavity size. Although not documented patient likely has a degree of diastolic LV dysfunction. Patient does have a history of coronary artery disease that he back to 2002 and had a low risk Myoview study performed in 2011. Patient's hemoglobin here was 7.1 and stools were heme-positive. ER physician has consulted gastroenterology as well as cardiology. EDP has ordered 2 units of packed red blood cells to be infused. Because of concerns for heart failure he has been given one-time dose of Lasix 60 mg IV, he's been given 80 mg of Protonix, and due to concerns of possible sepsis from pneumonia process he has been given a dose of Rocephin and Zithromax to cover community-acquired pneumonia. Blood cultures have been obtained as  well.     Review of Systems   In addition to the HPI above,  No Fever-chills, myalgias or other constitutional symptoms No Headache, changes with Vision or hearing, new weakness, tingling, numbness in any extremity, No problems swallowing food or Liquids, indigestion/reflux No Chest pain, Cough or Shortness of Breath, palpitations, orthopnea or DOE No Abdominal pain, N/V; no melena or hematochezia, no dark tarry stools, Bowel movements are regular, No dysuria, hematuria or flank pain No new skin rashes, lesions, masses or bruises, No new joints pains-aches No recent weight gain or loss No polyuria, polydypsia or polyphagia,  *A full 10 point Review of Systems was done, except as stated above, all other Review of Systems were negative.  Social History Social History  Substance Use Topics  . Smoking status: Former Smoker -- 1.00 packs/day for  40 years  . Smokeless tobacco: Never Used     Comment: quit approx. 20 years ago.  . Alcohol Use: No    Resides at: Private residence  Lives with: Alone  Ambulatory status: Without assistive devices   Family History Family History  Problem Relation Age of Onset  . Stroke Sister   . Heart disease Brother   . Diabetes Neg Hx      Prior to Admission medications   Medication Sig Start Date End Date Taking? Authorizing Provider  ACCU-CHEK FASTCLIX LANCETS MISC Use to check blood sugar 2 times per day dx code E11.9 07/20/15  Yes Elayne Snare, MD  allopurinol (ZYLOPRIM) 100 MG tablet TAKE 1 TABLET (100 MG TOTAL) BY MOUTH AT BEDTIME. 01/27/15  Yes Rhonda G Barrett, PA-C  amLODipine (NORVASC) 10 MG tablet Take 1 tablet by mouth every morning.  05/27/13  Yes Historical Provider, MD  aspirin EC 81 MG tablet Take 81 mg by mouth every evening.   Yes Historical Provider, MD  atorvastatin (LIPITOR) 40 MG tablet Take 1 tablet by mouth daily. 08/31/15  Yes Historical Provider, MD  calcitRIOL (ROCALTROL) 0.25 MCG capsule Take 0.25 mcg by mouth every  morning.  05/27/13  Yes Historical Provider, MD  cholecalciferol (VITAMIN D) 1000 UNITS tablet Take 1,000 Units by mouth 2 (two) times daily.    Yes Historical Provider, MD  cloNIDine (CATAPRES) 0.1 MG tablet Take 1 tablet by mouth 3 (three) times daily. 08/17/15  Yes Historical Provider, MD  clopidogrel (PLAVIX) 75 MG tablet TAKE 1 TABLET BY MOUTH DAILY 11/01/14  Yes Lorretta Harp, MD  feeding supplement, RESOURCE BREEZE, (RESOURCE BREEZE) LIQD Take 1 Container by mouth 3 (three) times daily between meals. 03/29/15  Yes Belkys A Regalado, MD  glucose blood (ACCU-CHEK AVIVA PLUS) test strip Use as instructed to check blood sugar 2 times per day dx code E11.9 06/01/15  Yes Elayne Snare, MD  hydrALAZINE (APRESOLINE) 25 MG tablet Take 3 tablets (75 mg total) by mouth every 8 (eight) hours. 03/29/15  Yes Belkys A Regalado, MD  Potassium Chloride ER 20 MEQ TBCR Take 1 tablet by mouth daily. 09/07/15  Yes Historical Provider, MD  sitaGLIPtin (JANUVIA) 25 MG tablet Take 1 tablet (25 mg total) by mouth daily. 05/03/15  Yes Elayne Snare, MD  terazosin (HYTRIN) 10 MG capsule TAKE 1 CAPSULE (10 MG TOTAL) BY MOUTH AT BEDTIME. 09/07/15  Yes Lorretta Harp, MD  hydrALAZINE (APRESOLINE) 50 MG tablet Take 50 mg by mouth 3 (three) times daily. 08/31/15   Historical Provider, MD  Multiple Vitamins-Minerals (PRESERVISION AREDS PO) Take 2 tablets by mouth daily.    Historical Provider, MD  pantoprazole (PROTONIX) 40 MG tablet Take 1 tablet (40 mg total) by mouth daily. 03/29/15   Belkys A Regalado, MD  polyethylene glycol (MIRALAX / GLYCOLAX) packet Take 17 g by mouth 2 (two) times daily. 03/29/15   Elmarie Shiley, MD    Allergies  Allergen Reactions  . Labetalol Swelling    angioedema    Physical Exam  Vitals  Blood pressure 172/47, pulse 105, temperature 97.6 F (36.4 C), temperature source Axillary, resp. rate 20, height 5' 6"  (1.676 m), weight 155 lb (70.308 kg), SpO2 97 %.   General:  In acute respiratory  distress as evidenced ongoing increased work of breathing and tachypnea as well as persistent recurrent arrhythmia  Psych:  Normal affect, Denies Suicidal or Homicidal ideations, Awake Alert, Oriented X 3. Speech and thought patterns are clear and appropriate,  no apparent short term memory deficits  Neuro:   No focal neurological deficits, CN II through XII intact, Strength 5/5 all 4 extremities, Sensation intact all 4 extremities.  ENT:  Ears and Eyes appear Normal, Conjunctivae clear, PER. Moist oral mucosa without erythema or exudates.  Neck:  Supple, No lymphadenopathy appreciated  Respiratory:  Symmetrical chest wall movement, Good air movement bilaterally with increased work of breathing and tachypnea, bilateral crackles up to mid fields; BiPAP with 50% FiO2  Cardiac: Irregular at times with runs of MAT and frequent PACs but at baseline sinus tachycardia, No Murmurs, trace left LE edema noted, no JVD, No carotid bruits, peripheral pulses palpable at 2+  Abdomen:  Positive bowel sounds, Soft, diffuse minimal tenderness especially over suprapubic region, somewhat distended,  No masses appreciated, no obvious hepatosplenomegaly  Genitourinary: No urinary output greater than 30 minutes after Lasix administered so are instructed to insert Foley catheter  Skin:  No Cyanosis, Normal Skin Turgor, No Skin Rash   Extremities: Symmetrical without obvious trauma or injury,  no effusions.  Data Review  CBC  Recent Labs Lab 09/08/15 0913 09/11/15 0955 09/11/15 1007  WBC  --  21.3*  --   HGB 6.4* 7.5* 7.1*  HCT  --  22.6* 21.0*  PLT  --  216  --   MCV  --  94.2  --   MCH  --  31.3  --   MCHC  --  33.2  --   RDW  --  18.3*  --   LYMPHSABS  --  0.9  --   MONOABS  --  1.4*  --   EOSABS  --  0.0  --   BASOSABS  --  0.0  --     Chemistries   Recent Labs Lab 09/11/15 0955 09/11/15 1007  NA 142 141  K 3.1* 3.2*  CL 102 104  CO2 21*  --   GLUCOSE 247* 238*  BUN 102* 98*   CREATININE 3.82* 3.50*  CALCIUM 8.9  --   MG 2.6*  --   AST 73*  --   ALT 22  --   ALKPHOS 82  --   BILITOT 1.3*  --     estimated creatinine clearance is 14.7 mL/min (by C-G formula based on Cr of 3.5).  No results for input(s): TSH, T4TOTAL, T3FREE, THYROIDAB in the last 72 hours.  Invalid input(s): FREET3  Coagulation profile  Recent Labs Lab 09/11/15 0955  INR 1.25    No results for input(s): DDIMER in the last 72 hours.  Cardiac Enzymes No results for input(s): CKMB, TROPONINI, MYOGLOBIN in the last 168 hours.  Invalid input(s): CK  Invalid input(s): POCBNP  Urinalysis    Component Value Date/Time   COLORURINE YELLOW 03/21/2015 1600   APPEARANCEUR CLEAR 03/21/2015 1600   LABSPEC 1.010 03/21/2015 1600   PHURINE 5.0 03/21/2015 1600   GLUCOSEU NEGATIVE 03/21/2015 1600   HGBUR NEGATIVE 03/21/2015 1600   BILIRUBINUR NEGATIVE 03/21/2015 1600   KETONESUR NEGATIVE 03/21/2015 1600   PROTEINUR 100* 03/21/2015 1600   UROBILINOGEN 0.2 03/21/2015 1600   NITRITE NEGATIVE 03/21/2015 1600   LEUKOCYTESUR NEGATIVE 03/21/2015 1600    Imaging results:   Dg Chest Port 1 View  09/11/2015  CLINICAL DATA:  Pt arrives from home via [facility]. Pt states he began having SOB last night and wasn't able to lay down and still breathe. Pt is extremely labored upon arrival and is using accessory muscles. Pt has bilateral coarse crackles. Pt states  he is in "borderline kidney failure" and quit smoking 30 years ago EXAM: PORTABLE CHEST 1 VIEW COMPARISON:  03/21/2015 FINDINGS: Bilateral airspace opacity has developed in the mid to lower lungs, right greater than left. The upper lungs are clear. Cardiac silhouette is normal in size. No mediastinal or hilar masses. No definite pleural effusion and no pneumothorax. Bony thorax is demineralized but grossly intact. IMPRESSION: Bilateral mid to lower lung zone airspace opacities, right greater than left. This may reflect multifocal pneumonia. Pulmonary  edema is possible, with pneumonia favored. Electronically Signed   By: Lajean Manes M.D.   On: 09/11/2015 10:07     EKG: (Independently reviewed) ordered but not resulted   Assessment & Plan  Principal Problem:   Sepsis due to community acquired pneumonia [facility]) -Initial plan was to admit to stepdown but have discussed case with Dr. Nelda Marseille and plan is to admit to ICU -Has been given Rocephin and Zithromax in the ER and blood cultures were obtained -Continue BiPAP but suspect will likely need intubation -ABG  -85% on 50% FiO2 but patient continues with increased work of breathing and tachypnea -Initial lactic acid 6.69 and repeat is pending -Procalcitonin pending -Check urinary strep and Legionella  Active Problems:   NSTEMI (HCC)/CAD minor CAD in '02. Myoview low risk 9/11 -never had chest pain  -Dr. Ross/cardiology evaluating -Echo and process-per Dr. Harrington Challenger continues with hyperdynamic EF -Continues to have runs of PVCs and MAT so recommend keeping potassium greater than or equal to 4 -Magnesium 2.6 -Cycle troponin -Suspect triggering factors combination of sepsis and progressive anemia -With current symptomatic anemia and heme positive stool unable to anticoagulate -Continue baby aspirin and Plavix which he takes at home -did receive full dose chewable aspirin and ER    Acute respiratory failure with hypoxia (HCC)/CAP  -BiPAP as above -Antibiotics as above -Based on above echo report suspect respiratory failure primarily driven by pneumonia with a lesser degree of superimposed flash pulmonary edema    Acute hypokalemia -Oral replete -Follow lytes    Hypertensive urgency -Nitroglycerin infusion   -Continue preadmission clonidine to prevent rebound tachycardia -Continue hydralazine    Uncontrolled diabetes mellitus (HCC) -Current CBGs consistently greater than 200 -Follow CBGs every 4 hours and provide SSI -Hold home Januvia -Recent A1c 6. 07/02/2015    Symptomatic  anemia/Heme positive stool -GI consulted -Medically to unstable to pursue endoscopic evaluation at this juncture -EDP has ordered 2 units packed red blood cells -Cycle CBC with repeat CBC after second unit infused -No gross or frank red blood observed    CKD (chronic kidney disease) stage 4, GFR 15-29 ml/min (HCC) -Function stable and at baseline    Hypercholesteremia -On statin prior to admission      DVT Prophylaxis: Renal dose adjusted Lovenox  Family Communication:  Daughter at bedside   Code Status:  Confirmed with patient and with daughter. Full code  Condition:  Guarded and unstable  Discharge disposition: Unknown at this juncture given patient's tenuous status to anticipate likely will require at minimum short-term rehabilitation prior to returning to home environment  Time spent in minutes : 60      ELLIS,ALLISON L. ANP on 09/11/2015 at 12:12 PM  Between 7am to 7pm - Pager - [phone]  After 7pm go to [email or web address] - password [email or web address]  And look for the night coverage person covering me after hours  [facility]  I have evaluated the patient, reviewed the chart, modified the above note and discussed the plan with  Erin Hearing, NP  Critically ill gentleman with sepsis secondary to pneumonia and anemia with hemoccult positive stool. Suspect combination of sepsis, hypoxia and anemia are what have resulted in an NSTEMI. Likely has underlying coronary artery disease (has PVD with stents in left iliac). Risk factors include DM, HTN, HLP. Stress test in 2011 was normal.  Having runs of SVT. Electrolytes stable. Will transfuse 2 U PRBC. Has been given 60 mg IV Lasix in ER. Foley placed- < 300 cc of urine in bag. He is already on Lasix as outpt. Monitor for fluid overload with transfusions. ECHO read at bedside by Dr Harrington Challenger. Hyperdynamic LV with diastolic dysfunction. No wall motion abnormality seen.  Have asked Critical care team to take over care as he is a  high risk for decompensation.   Debbe Odea, MD Pager: [email or web address]

## 2015-09-11 NOTE — Consult Note (Signed)
PULMONARY / CRITICAL CARE MEDICINE   Name: [name] MRN: [id number] DOB: 08-Aug-1930    ADMISSION DATE:  09/11/2015 CONSULTATION DATE:  11/13  REFERRING MD :  Butler Denmark   CHIEF COMPLAINT:  Acute resp failure   INITIAL PRESENTATION:  81 y/o male w/ stage IV CKD , HTN and CAD. Presents to the ED on 11/13 w/ acute resp failure in setting of acute pulmonary edema, hypertensive crisis and NSTEMI +/- CAP CCM asked to assess given degree of respiratory failure and ongoing ischemia w/ concern the pt would require intubation.  STUDIES:  ECHO 11/13>>>Systolic function wasvigorous. The estimated ejection fraction was in the range of 65% to 70%. Doppler parameters are consistent with abnormal leftventricular relaxation (grade 1 diastolic dysfunction). - Left atrium: The atrium was mildly dilated. - Pulmonary arteries: PA peak pressure: 65 mm Hg (S).  SIGNIFICANT EVENTS: 11/13: admit    HISTORY OF PRESENT ILLNESS:   81 year old male w/sig h/o hypertension, diabetes, symptomatic bradycardia, peripheral vascular disease, stage IV chronic kidney disease, anemia of chronic kidney disease on Procrit, and dyslipidemia. Patient reported to the [facility] 11/13 w/ CC: 2 days of shortness of breath.  He's not had any cough or chest pain he's had subjective chills but no fevers. He reported some increase in lower extremity edema. Because of significant shortness of breath he presented to the ER today the EMS. Upon EMS arrival to the home patient had increased work of breathing with extremely labored effort and use of accessory muscles. On exam he was found to have bilateral coarse crackles. Pulse oximetry on room air was 80%. Patient received 2 DuoNeb's without any improvement in symptoms. BiPAP was initiated by EMS. In ED CXR showed R>L airspace disease. Admitted to the IM service w/ working dx of acute resp failure in setting of pulmonary edema in setting of NSTEMI +/- PNA. PCCM asked to see re: need for  intubation given AMI.    PAST MEDICAL HISTORY :   has a past medical history of Diabetes mellitus; Hypertension; Hypercholesteremia; Gout; PVD (peripheral vascular disease) (HCC) (7/05); CAD (coronary artery disease) (2002); Macular degeneration; Chronic renal disease, stage III; Claudication (HCC); Palpitations; Bilateral carotid artery disease (HCC); and Anemia.  has past surgical history that includes 2D ECHOCARDIOGRAM (03/31/2008); Cardiovascular stress test (06/30/2010); Cerebral angiogram (04/03/2004); ABDOMINAL AORTOGRAM (09/01/2007); PERIPHERAL VASCULAR ANGIOGRAM (05/07/2011); CAROTID DOPPLER (06/09/2012); Cardiac catheterization (10/17/2001); Carotid endarterectomy (Left, July 2005); lower extremity angiogram (Bilateral, 11/01/2014); abdominal angiogram (11/01/2014); Knee surgery; and Hernia repair. Prior to Admission medications   Medication Sig Start Date End Date Taking? Authorizing Provider  ACCU-CHEK FASTCLIX LANCETS MISC Use to check blood sugar 2 times per day dx code E11.9 07/20/15  Yes Reather Littler, MD  allopurinol (ZYLOPRIM) 100 MG tablet TAKE 1 TABLET (100 MG TOTAL) BY MOUTH AT BEDTIME. 01/27/15  Yes Rhonda G Barrett, PA-C  amLODipine (NORVASC) 10 MG tablet Take 1 tablet by mouth every morning.  05/27/13  Yes Historical Provider, MD  aspirin EC 81 MG tablet Take 81 mg by mouth every evening.   Yes Historical Provider, MD  atorvastatin (LIPITOR) 40 MG tablet Take 1 tablet by mouth daily. 08/31/15  Yes Historical Provider, MD  calcitRIOL (ROCALTROL) 0.25 MCG capsule Take 0.25 mcg by mouth every morning.  05/27/13  Yes Historical Provider, MD  cholecalciferol (VITAMIN D) 1000 UNITS tablet Take 1,000 Units by mouth 2 (two) times daily.    Yes Historical Provider, MD  cloNIDine (CATAPRES) 0.1 MG tablet Take 1 tablet by mouth 3 (three)  times daily. 08/17/15  Yes Historical Provider, MD  clopidogrel (PLAVIX) 75 MG tablet TAKE 1 TABLET BY MOUTH DAILY 11/01/14  Yes [name], MD  feeding supplement,  RESOURCE BREEZE, (RESOURCE BREEZE) LIQD Take 1 Container by mouth 3 (three) times daily between meals. 03/29/15  Yes Belkys A Regalado, MD  glucose blood (ACCU-CHEK AVIVA PLUS) test strip Use as instructed to check blood sugar 2 times per day dx code E11.9 06/01/15  Yes Reather Littler, MD  hydrALAZINE (APRESOLINE) 25 MG tablet Take 3 tablets (75 mg total) by mouth every 8 (eight) hours. 03/29/15  Yes Belkys A Regalado, MD  Potassium Chloride ER 20 MEQ TBCR Take 1 tablet by mouth daily. 09/07/15  Yes Historical Provider, MD  sitaGLIPtin (JANUVIA) 25 MG tablet Take 1 tablet (25 mg total) by mouth daily. 05/03/15  Yes Reather Littler, MD  terazosin (HYTRIN) 10 MG capsule TAKE 1 CAPSULE (10 MG TOTAL) BY MOUTH AT BEDTIME. 09/07/15  Yes [name], MD  hydrALAZINE (APRESOLINE) 50 MG tablet Take 50 mg by mouth 3 (three) times daily. 08/31/15   Historical Provider, MD  Multiple Vitamins-Minerals (PRESERVISION AREDS PO) Take 2 tablets by mouth daily.    Historical Provider, MD  pantoprazole (PROTONIX) 40 MG tablet Take 1 tablet (40 mg total) by mouth daily. 03/29/15   Belkys A Regalado, MD  polyethylene glycol (MIRALAX / GLYCOLAX) packet Take 17 g by mouth 2 (two) times daily. 03/29/15   Alba Cory, MD   Allergies  Allergen Reactions  . Labetalol Swelling    angioedema    FAMILY HISTORY:  has no family status information on file.  SOCIAL HISTORY:  reports that he has quit smoking. He has never used smokeless tobacco. He reports that he does not drink alcohol.  REVIEW OF SYSTEMS:   Not able d/t WOB   SUBJECTIVE:  Marked work of breathing   VITAL SIGNS: Temp:  [97.6 F (36.4 C)-98.8 F (37.1 C)] 98.8 F (37.1 C) (11/13 1213) Pulse Rate:  [99-110] 101 (11/13 1345) Resp:  [19-113] 21 (11/13 1345) BP: (152-187)/(44-67) 181/59 mmHg (11/13 1345) SpO2:  [90 %-100 %] 94 % (11/13 1345) FiO2 (%):  [100 %] 100 % (11/13 1200) Weight:  [70.308 kg (155 lb)] 70.308 kg (155 lb) (11/13 0937) HEMODYNAMICS:    VENTILATOR SETTINGS: Vent Mode:  [-]  FiO2 (%):  [100 %] 100 % INTAKE / OUTPUT:  Intake/Output Summary (Last 24 hours) at 09/11/15 1515 Last data filed at 09/11/15 1225  Gross per 24 hour  Intake    335 ml  Output     50 ml  Net    285 ml    PHYSICAL EXAMINATION: General:  Chronically ill appearing elderly white male, currently w/ marked accessory muscle use and work of breathing  Neuro:  Awake, oriented. No focal def  HEENT:  NCAT, no JVD Cardiovascular:  RRR no MRG  Lungs:  + accessory muscle use, crackles in bases  Abdomen:  Soft, non-tender + bowel sounds  Musculoskeletal:  Intact, good bulk Skin:  Warm, + pulses brisk CR + LE edema   LABS:  CBC  Recent Labs Lab 09/08/15 0913 09/11/15 0955 09/11/15 1007  WBC  --  21.3*  --   HGB 6.4* 7.5* 7.1*  HCT  --  22.6* 21.0*  PLT  --  216  --    Coag's  Recent Labs Lab 09/11/15 0955  INR 1.25   BMET  Recent Labs Lab 09/11/15 0955 09/11/15 1007  NA  142 141  K 3.1* 3.2*  CL 102 104  CO2 21*  --   BUN 102* 98*  CREATININE 3.82* 3.50*  GLUCOSE 247* 238*   Electrolytes  Recent Labs Lab 09/11/15 0955  CALCIUM 8.9  MG 2.6*   Sepsis Markers  Recent Labs Lab 09/11/15 1008  LATICACIDVEN 6.69*   ABG  Recent Labs Lab 09/11/15 1242  PHART 7.408  PCO2ART 31.0*  PO2ART 84.0   Liver Enzymes  Recent Labs Lab 09/11/15 0955  AST 73*  ALT 22  ALKPHOS 82  BILITOT 1.3*  ALBUMIN 3.1*   Cardiac Enzymes No results for input(s): TROPONINI, PROBNP in the last 168 hours. Glucose  Recent Labs Lab 09/11/15 0946  GLUCAP 227*    Imaging Dg Chest Port 1 View  09/11/2015  CLINICAL DATA:  Shortness of breath, sepsis EXAM: PORTABLE CHEST 1 VIEW COMPARISON:  09/11/2015 FINDINGS: Defibrillator pads and monitor leads overlie the chest. Asymmetric mid and lower lung airspace process, worse on the right compatible with asymmetric edema or pneumonia. Heart remains enlarged. No developing significant  effusion or pneumothorax. Trachea midline. Aortic atherosclerotic. No significant interval change. IMPRESSION: Persistent mid and lower lung asymmetric airspace process worse on the right compatible with pneumonia or asymmetric edema. Electronically Signed   By: Judie Petit.  Shick M.D.   On: 09/11/2015 12:38   Dg Chest Port 1 View  09/11/2015  CLINICAL DATA:  Pt arrives from home via [facility]. Pt states he began having SOB last night and wasn't able to lay down and still breathe. Pt is extremely labored upon arrival and is using accessory muscles. Pt has bilateral coarse crackles. Pt states he is in "borderline kidney failure" and quit smoking 30 years ago EXAM: PORTABLE CHEST 1 VIEW COMPARISON:  03/21/2015 FINDINGS: Bilateral airspace opacity has developed in the mid to lower lungs, right greater than left. The upper lungs are clear. Cardiac silhouette is normal in size. No mediastinal or hilar masses. No definite pleural effusion and no pneumothorax. Bony thorax is demineralized but grossly intact. IMPRESSION: Bilateral mid to lower lung zone airspace opacities, right greater than left. This may reflect multifocal pneumonia. Pulmonary edema is possible, with pneumonia favored. Electronically Signed   By: Amie Portland M.D.   On: 09/11/2015 10:07     ASSESSMENT / PLAN:  PULMONARY OETT A: Acute Hypoxic respiratory Failure in setting bilateral R>L pulmonary infiltrates most likely representing pulmonary edema but need to r/o CAP P:   Intubate/ventilate PAD protocol  See ID section  Full vent support for next 24 hrs at least until CEs stabilize   CARDIOVASCULAR CVL A:  H/o CAD NSTEMI Pulmonary edema d/t decompensated diastolic dysfxn and possibly exacerbated by anemia  Hypertensive Crisis  P:  Asa and plavix only Transfuse for goal hgb > 8 CVP from CVL Lasix gtt Nitro gtt  Tele  Cycle CEs  RENAL A:   CRI Lactic acidosis  Hypokalemia  P:   Maximize CO Replace K  Strict I&O Renal dose  meds   GASTROINTESTINAL A:   Heme + stools  P:   NPO Bid PPI   HEMATOLOGIC A:   Anemia of chronic disease  Heme + stools  P:  F/u CBC s/p transfusion Not stable enough for GI eval  PPI Careful obs on LMWH  INFECTIOUS A:   R/o PNA  P:   Send sputum for C&S>>> PCT algo  azith 11/13>>> roceph 11/13>>>  ENDOCRINE A:   DM w/ hyperglycemia  P:   ssi protocol  NEUROLOGIC A:   Sedation  P:   RASS goal: -2 PAD protocol  Supportive care    FAMILY  - Updates: pt is full code but would not want prolonged care. Will push aggressive interventions x 48-72 hrs. If things worsen we can transition to DNR. Pt does not want prolonged support if little hope of returning to baseline   - Inter-disciplinary family meet or Palliative Care meeting due by:  11/20    TODAY'S SUMMARY:  CAD, NSTEMI, decompensated Diastolic HF in setting of hypertensive crisis and possibly exacerbated by symptomatic anemia. Doubt this is PNA. Will intubate given elevated trop I and work of breathing, stabilize w/ effort to decrease SBP by 25%, cont lasix, cycle CEs and cont supportive care. If worsens we would not cont aggressive support but for now full code. Good discussion w/ pt and family. Hope we can turn things around in next 48 hrs or so.     Simonne MartinetPeter E Babcock ACNP-BC [facility] Pulmonary/Critical Care Pager # [phone] OR # [phone] if no answer  Attending Note:  81 year old male with PMH of significant cardiac disease who presented to the hospital with progressive SOB x2 days and lower ext edema.  Troponins were positive, echo noted, cardiology saw patient and believes it is demand ischemia rather than a NSTEMI.  I do not see any evidence to support PNA.  Patient was started on rocephin and zithromax.  Will check PCT if negative will d/c.  Patient has diffuse crackles.  We spoke with the family and patient.  He made it clear that if does not wish to be on life support for a prolonged period of time.   Will intubate and attempt to diurese.  If unsuccessful then will transition to comfort care but remain full code for now.  The patient is critically ill with multiple organ systems failure and requires high complexity decision making for assessment and support, frequent evaluation and titration of therapies, application of advanced monitoring technologies and extensive interpretation of multiple databases.   Critical Care Time devoted to patient care services described in this note is  35  Minutes. This time reflects time of care of this signee Dr Koren BoundWesam Dequane Strahan. This critical care time does not reflect procedure time, or teaching time or supervisory time of PA/NP/Med student/Med Resident etc but could involve care discussion time.  Alyson ReedyWesam G. Galya Dunnigan, M.D. [facility] Pulmonary/Critical Care Medicine. Pager: [phone]. After hours pager: [phone].  09/11/2015, 3:15 PM

## 2015-09-11 NOTE — Procedures (Signed)
Intubation Procedure Note [name] [id number] 01/27/1930  Procedure: Intubation Indications: Respiratory insufficiency  Procedure Details Consent: Risks of procedure as well as the alternatives and risks of each were explained to the (patient/caregiver).  Consent for procedure obtained. Time Out: Verified patient identification, verified procedure, site/side was marked, verified correct patient position, special equipment/implants available, medications/allergies/relevent history reviewed, required imaging and test results available.  Performed  Maximum sterile technique was used including antiseptics, cap, gloves, gown, hand hygiene and mask.  MAC and 4  Size 8 ETT. Secured 22 at lip.  Evaluation Hemodynamic Status: BP stable throughout; O2 sats: stable throughout Patient's Current Condition: stable Complications: No apparent complications Patient did tolerate procedure well. Chest X-ray ordered to verify placement.  CXR: pending.   [name] 09/11/2015

## 2015-09-11 NOTE — Consult Note (Signed)
Primary Physician: Primary Cardiologist:   HPI:  Asked to see re SOB PT is an 81 yo with history of HTN, mild CAD, PVOD, PVCs.  Presents with SOB  Denies CP  No palpitations.        Past Medical History  Diagnosis Date  . Diabetes mellitus   . Hypertension   . Hypercholesteremia   . Gout   . PVD (peripheral vascular disease) (HCC) 7/05    LCE, known 60% RICA 7/12  . CAD (coronary artery disease) 2002    non critical  . Macular degeneration   . Chronic renal disease, stage III   . Claudication (HCC)   . Palpitations     PVCs and PSVT on Holter monitoring  . Bilateral carotid artery disease (HCC)     status post left carotid endarterectomy performed by Dr. Madilyn Fireman 05/12/04  . Anemia      (Not in a hospital admission)   . aspirin EC  81 mg Oral QPM  . cloNIDine  0.1 mg Oral TID  . clopidogrel  75 mg Oral Daily  . enoxaparin (LOVENOX) injection  30 mg Subcutaneous Q24H  . hydrALAZINE  50 mg Oral TID  . insulin aspart  0-9 Units Subcutaneous Q4H  . pantoprazole (PROTONIX) IV  40 mg Intravenous Q12H  . potassium chloride  40 mEq Oral BID  . sodium chloride  3 mL Intravenous Q12H  . terazosin  10 mg Oral QHS    Infusions: . sodium chloride    . azithromycin (ZITHROMAX) 500 MG IVPB 500 mg (09/11/15 1204)  . [START ON 09/12/2015] azithromycin    . [START ON 09/12/2015] cefTRIAXone (ROCEPHIN)  IV    . nitroGLYCERIN      Allergies  Allergen Reactions  . Labetalol Swelling    angioedema    Social History   Social History  . Marital Status: Widowed    Spouse Name: N/A  . Number of Children: N/A  . Years of Education: N/A   Occupational History  . Not on file.   Social History Main Topics  . Smoking status: Former Smoker -- 1.00 packs/day for 40 years  . Smokeless tobacco: Never Used     Comment: quit approx. 20 years ago.  . Alcohol Use: No  . Drug Use: Not on file  . Sexual Activity: No   Other Topics Concern  . Not on file   Social History  Narrative    Family History  Problem Relation Age of Onset  . Stroke Sister   . Heart disease Brother   . Diabetes Neg Hx     REVIEW OF SYSTEMS:  All systems reviewed  Negative to the above problem except as noted above.    PHYSICAL EXAM: Filed Vitals:   09/11/15 1230  BP: 169/62  Pulse: 105  Temp:   Resp: 20     Intake/Output Summary (Last 24 hours) at 09/11/15 1242 Last data filed at 09/11/15 1225  Gross per 24 hour  Intake    335 ml  Output     50 ml  Net    285 ml    General:  Pt is a thin 81 yo with SOB on BiPAP HEENT: normal Neck: supple.  JVP is increased  . Cor: PMI nondisplaced. Regular rate & rhythm. No rubs, gallops or murmurs. Lungs: Rales bilaterally Abdomen: soft  Mild RUQ tenderness. No  masses. Good bowel sounds. Extremities: no cyanosis, clubbing, rash, Tr edema Neuro: alert & oriented x 3, cranial nerves  grossly intact. moves all 4 extremities w/o difficulty. Affect pleasant.  ECG:  ST 108 bpm  Occasional PVC  LVH with strain pattern  Results for orders placed or performed during the hospital encounter of 09/11/15 (from the past 24 hour(s))  CBG monitoring, ED     Status: Abnormal   Collection Time: 09/11/15  9:46 AM  Result Value Ref Range   Glucose-Capillary 227 (H) 65 - 99 mg/dL  CBC with Differential     Status: Abnormal   Collection Time: 09/11/15  9:55 AM  Result Value Ref Range   WBC 21.3 (H) 4.0 - 10.5 K/uL   RBC 2.40 (L) 4.22 - 5.81 MIL/uL   Hemoglobin 7.5 (L) 13.0 - 17.0 g/dL   HCT 56.2 (L) 13.0 - 86.5 %   MCV 94.2 78.0 - 100.0 fL   MCH 31.3 26.0 - 34.0 pg   MCHC 33.2 30.0 - 36.0 g/dL   RDW 78.4 (H) 69.6 - 29.5 %   Platelets 216 150 - 400 K/uL   Neutrophils Relative % 89 %   Neutro Abs 19.0 (H) 1.7 - 7.7 K/uL   Lymphocytes Relative 4 %   Lymphs Abs 0.9 0.7 - 4.0 K/uL   Monocytes Relative 7 %   Monocytes Absolute 1.4 (H) 0.1 - 1.0 K/uL   Eosinophils Relative 0 %   Eosinophils Absolute 0.0 0.0 - 0.7 K/uL   Basophils Relative  0 %   Basophils Absolute 0.0 0.0 - 0.1 K/uL  Brain natriuretic peptide     Status: Abnormal   Collection Time: 09/11/15  9:55 AM  Result Value Ref Range   B Natriuretic Peptide 2200.4 (H) 0.0 - 100.0 pg/mL  Comprehensive metabolic panel     Status: Abnormal   Collection Time: 09/11/15  9:55 AM  Result Value Ref Range   Sodium 142 135 - 145 mmol/L   Potassium 3.1 (L) 3.5 - 5.1 mmol/L   Chloride 102 101 - 111 mmol/L   CO2 21 (L) 22 - 32 mmol/L   Glucose, Bld 247 (H) 65 - 99 mg/dL   BUN 284 (H) 6 - 20 mg/dL   Creatinine, Ser 1.32 (H) 0.61 - 1.24 mg/dL   Calcium 8.9 8.9 - 44.0 mg/dL   Total Protein 5.8 (L) 6.5 - 8.1 g/dL   Albumin 3.1 (L) 3.5 - 5.0 g/dL   AST 73 (H) 15 - 41 U/L   ALT 22 17 - 63 U/L   Alkaline Phosphatase 82 38 - 126 U/L   Total Bilirubin 1.3 (H) 0.3 - 1.2 mg/dL   GFR calc non Af Amer 13 (L) >60 mL/min   GFR calc Af Amer 16 (L) >60 mL/min   Anion gap 19 (H) 5 - 15  Type and screen [facility]     Status: None (Preliminary result)   Collection Time: 09/11/15  9:55 AM  Result Value Ref Range   ABO/RH(D) B POS    Antibody Screen NEG    Sample Expiration 09/14/2015    Unit Number [id number]    Blood Component Type RED CELLS,LR    Unit division 00    Status of Unit ISSUED    Transfusion Status OK TO TRANSFUSE    Crossmatch Result Compatible    Unit Number [id number]    Blood Component Type RED CELLS,LR    Unit division 00    Status of Unit ALLOCATED    Transfusion Status OK TO TRANSFUSE    Crossmatch Result Compatible   Protime-INR  Status: Abnormal   Collection Time: 09/11/15  9:55 AM  Result Value Ref Range   Prothrombin Time 15.8 (H) 11.6 - 15.2 seconds   INR 1.25 0.00 - 1.49  Magnesium     Status: Abnormal   Collection Time: 09/11/15  9:55 AM  Result Value Ref Range   Magnesium 2.6 (H) 1.7 - 2.4 mg/dL  ABO/Rh     Status: None (Preliminary result)   Collection Time: 09/11/15  9:55 AM  Result Value Ref Range   ABO/RH(D) B POS    I-stat troponin, ED     Status: Abnormal   Collection Time: 09/11/15 10:06 AM  Result Value Ref Range   Troponin i, poc 7.08 (HH) 0.00 - 0.08 ng/mL   Comment NOTIFIED PHYSICIAN    Comment 3          I-stat chem 8, ed     Status: Abnormal   Collection Time: 09/11/15 10:07 AM  Result Value Ref Range   Sodium 141 135 - 145 mmol/L   Potassium 3.2 (L) 3.5 - 5.1 mmol/L   Chloride 104 101 - 111 mmol/L   BUN 98 (H) 6 - 20 mg/dL   Creatinine, Ser 1.61 (H) 0.61 - 1.24 mg/dL   Glucose, Bld 096 (H) 65 - 99 mg/dL   Calcium, Ion 0.45 (L) 1.13 - 1.30 mmol/L   TCO2 21 0 - 100 mmol/L   Hemoglobin 7.1 (L) 13.0 - 17.0 g/dL   HCT 40.9 (L) 81.1 - 91.4 %  I-Stat CG4 Lactic Acid, ED     Status: Abnormal   Collection Time: 09/11/15 10:08 AM  Result Value Ref Range   Lactic Acid, Venous 6.69 (HH) 0.5 - 2.0 mmol/L   Comment NOTIFIED PHYSICIAN   I-Stat Venous Blood Gas, ED (order at [facility] and [facility] only)     Status: Abnormal   Collection Time: 09/11/15 10:09 AM  Result Value Ref Range   pH, Ven 7.411 (H) 7.250 - 7.300   pCO2, Ven 37.8 (L) 45.0 - 50.0 mmHg   pO2, Ven 54.0 (H) 30.0 - 45.0 mmHg   Bicarbonate 24.0 20.0 - 24.0 mEq/L   TCO2 25 0 - 100 mmol/L   O2 Saturation 88.0 %   Acid-base deficit 1.0 0.0 - 2.0 mmol/L   Patient temperature HIDE    Sample type VENOUS   POC occult blood, ED RN will collect     Status: Abnormal   Collection Time: 09/11/15 10:29 AM  Result Value Ref Range   Fecal Occult Bld POSITIVE (A) NEGATIVE   Dg Chest Port 1 View  09/11/2015  CLINICAL DATA:  Shortness of breath, sepsis EXAM: PORTABLE CHEST 1 VIEW COMPARISON:  09/11/2015 FINDINGS: Defibrillator pads and monitor leads overlie the chest. Asymmetric mid and lower lung airspace process, worse on the right compatible with asymmetric edema or pneumonia. Heart remains enlarged. No developing significant effusion or pneumothorax. Trachea midline. Aortic atherosclerotic. No significant interval change. IMPRESSION: Persistent mid  and lower lung asymmetric airspace process worse on the right compatible with pneumonia or asymmetric edema. Electronically Signed   By: Judie Petit.  Shick M.D.   On: 09/11/2015 12:38   Dg Chest Port 1 View  09/11/2015  CLINICAL DATA:  Pt arrives [facility] home via [facility]. Pt states he began having SOB last night and wasn't able to lay down and still breathe. Pt is extremely labored upon arrival and is using accessory muscles. Pt has bilateral coarse crackles. Pt states he is in "borderline kidney failure" and quit smoking 30 years  ago EXAM: PORTABLE CHEST 1 VIEW COMPARISON:  03/21/2015 FINDINGS: Bilateral airspace opacity has developed in the mid to lower lungs, right greater than left. The upper lungs are clear. Cardiac silhouette is normal in size. No mediastinal or hilar masses. No definite pleural effusion and no pneumothorax. Bony thorax is demineralized but grossly intact. IMPRESSION: Bilateral mid to lower lung zone airspace opacities, right greater than left. This may reflect multifocal pneumonia. Pulmonary edema is possible, with pneumonia favored. Electronically Signed   By: Amie Portlandavid  Ormond M.D.   On: 09/11/2015 10:07     ASSESSMENT:  81 yo who presents with SOB no CP On exam evid of volume increase  Labs signif for Hgb 8.3  WBC 19.3  Trop 6.15  Lactic acid 5  Cr 3.8  Echo shows Vigorous LV function with no wall motion abnormalities  1.  Elevated troponin  Trop may reflect demand ischemia in setting of anemai and possible infeciton  Follow as treat other problems   2.  Acute diastolic CHF   Recomm:  Diurese with lasix   Pt on BiPAP  May need to be intubated Follow renal function  3.  CAD  As above  4  Renal  Follow closelsy as attempt diuresis.  WIll continue to follow

## 2015-09-11 NOTE — Progress Notes (Signed)
CRITICAL VALUE ALERT  Critical value received:  Lactic acid    Date of notification:  09-11-2015  Time of notification:  2114  Critical value read back:Yes.    Nurse who received alert:  Dorna LeitzAmanda Guffey RN  MD notified (1st page):  Expected value trending down.

## 2015-09-11 NOTE — Progress Notes (Signed)
  Echocardiogram 2D Echocardiogram has been performed.  Leta JunglingCooper, Elyn Krogh M 09/11/2015, 1:08 PM

## 2015-09-11 NOTE — Procedures (Signed)
Intubation Procedure Note [name] [id number] 03/07/1930  Procedure: Intubation Indications: Airway protection and maintenance  Procedure Details Consent: Risks of procedure as well as the alternatives and risks of each were explained to the (patient/caregiver).  Consent for procedure obtained. Time Out: Verified patient identification, verified procedure, site/side was marked, verified correct patient position, special equipment/implants available, medications/allergies/relevent history reviewed, required imaging and test results available.  Performed  Maximum sterile technique was used including gloves, hand hygiene and mask.  MAC    Evaluation Hemodynamic Status: BP stable throughout; O2 sats: stable throughout Patient's Current Condition: stable Complications: No apparent complications Patient did tolerate procedure well. Chest X-ray ordered to verify placement.  CXR: pending.   [name] 09/11/2015

## 2015-09-11 NOTE — ED Provider Notes (Signed)
CSN: [id number]     Arrival date & time 09/11/15  5732 History   First MD Initiated Contact with Patient 09/11/15 [phone]     Chief Complaint  Patient presents with  . Shortness of Breath     (Consider location/radiation/quality/duration/timing/severity/associated sxs/prior Treatment) HPI Comments: Pt with constipation, began taking miralax, then developed diarrhea/abdomina cramping Last night developed dyspnea  Patient is a 81 y.o. male presenting with shortness of breath.  Shortness of Breath Severity:  Severe Onset quality:  Gradual Duration:  1 day Timing:  Constant Progression:  Worsening Chronicity:  New Context: not URI   Relieved by:  Nothing Exacerbated by: laying down. Ineffective treatments: nebulizer en route. Associated symptoms: no abdominal pain (distention), no chest pain, no cough, no diaphoresis, no fever, no headaches, no hemoptysis, no rash, no sore throat, no sputum production, no vomiting and no wheezing   Risk factors: no hx of PE/DVT     Past Medical History  Diagnosis Date  . Diabetes mellitus   . Hypertension   . Hypercholesteremia   . Gout   . PVD (peripheral vascular disease) (HCC) 7/05    LCE, known 60% RICA 7/12  . CAD (coronary artery disease) 2002    non critical  . Macular degeneration   . Chronic renal disease, stage III   . Claudication (HCC)   . Palpitations     PVCs and PSVT on Holter monitoring  . Bilateral carotid artery disease (HCC)     status post left carotid endarterectomy performed by Dr. Madilyn Fireman 05/12/04  . Anemia    Past Surgical History  Procedure Laterality Date  . 2d echocardiogram  03/31/2008    EF greater than 55%  . Cardiovascular stress test  06/30/2010    Nonischemic. Low risk  . Cerebral angiogram  04/03/2004    High-grade 80% ostial L ICA stenosis. Medical management.  . Abdominal aortogram  09/01/2007    Widely patent renal arteries. Medical management.  . Peripheral vascular angiogram  05/07/2011    No  evidence of intracranial occlusions, stenosis, dissections, or aneurysms seen  . Carotid doppler  06/09/2012    R Vertebral-known occluded vessel, R Bulb/Proximal ICA-moderate to severe amount of plaque w/50-69% diameter reduction, L Subclavian-50-69% diameter reduction, L CEA-demonstrated increased velocities w/o evidence of hemodynamically significant stenosis  . Cardiac catheterization  10/17/2001    No significant CAD, normal LV systolic function.  . Carotid endarterectomy Left July 2005  . Lower extremity angiogram Bilateral 11/01/2014    Procedure: LOWER EXTREMITY ANGIOGRAM;  Surgeon: [name], MD;  Location[facility] CATH LAB;  Service: Cardiovascular;  Laterality: Bilateral;  . Abdominal angiogram  11/01/2014    Procedure: ABDOMINAL ANGIOGRAM;  Surgeon: [name], MD;  Location: [facility] CATH LAB;  Service: Cardiovascular;;  . Knee surgery    . Hernia repair     Family History  Problem Relation Age of Onset  . Stroke Sister   . Heart disease Brother   . Diabetes Neg Hx    Social History  Substance Use Topics  . Smoking status: Former Smoker -- 1.00 packs/day for 40 years  . Smokeless tobacco: Never Used     Comment: quit approx. 20 years ago.  . Alcohol Use: No    Review of Systems  Constitutional: Negative for fever and diaphoresis.  HENT: Negative for sore throat.   Eyes: Negative for visual disturbance.  Respiratory: Positive for shortness of breath. Negative for cough, hemoptysis, sputum production and wheezing.   Cardiovascular: Positive for  leg swelling. Negative for chest pain.  Gastrointestinal: Positive for nausea, diarrhea and abdominal distention. Negative for vomiting and abdominal pain (distention). Blood in stool: "I don't look at it"  Genitourinary: Negative for difficulty urinating.  Musculoskeletal: Negative for back pain and neck stiffness.  Skin: Negative for rash.  Neurological: Negative for syncope and headaches.      Allergies  Labetalol  Home  Medications   Prior to Admission medications   Medication Sig Start Date End Date Taking? Authorizing Provider  ACCU-CHEK FASTCLIX LANCETS MISC Use to check blood sugar 2 times per day dx code E11.9 07/20/15  Yes Reather Littler, MD  allopurinol (ZYLOPRIM) 100 MG tablet TAKE 1 TABLET (100 MG TOTAL) BY MOUTH AT BEDTIME. 01/27/15  Yes Rhonda G Barrett, PA-C  amLODipine (NORVASC) 10 MG tablet Take 1 tablet by mouth every morning.  05/27/13  Yes Historical Provider, MD  aspirin EC 81 MG tablet Take 81 mg by mouth every evening.   Yes Historical Provider, MD  atorvastatin (LIPITOR) 40 MG tablet Take 1 tablet by mouth daily. 08/31/15  Yes Historical Provider, MD  calcitRIOL (ROCALTROL) 0.25 MCG capsule Take 0.25 mcg by mouth every morning.  05/27/13  Yes Historical Provider, MD  cholecalciferol (VITAMIN D) 1000 UNITS tablet Take 1,000 Units by mouth 2 (two) times daily.    Yes Historical Provider, MD  cloNIDine (CATAPRES) 0.1 MG tablet Take 1 tablet by mouth 3 (three) times daily. 08/17/15  Yes Historical Provider, MD  clopidogrel (PLAVIX) 75 MG tablet TAKE 1 TABLET BY MOUTH DAILY 11/01/14  Yes [name], MD  feeding supplement, RESOURCE BREEZE, (RESOURCE BREEZE) LIQD Take 1 Container by mouth 3 (three) times daily between meals. 03/29/15  Yes Belkys A Regalado, MD  glucose blood (ACCU-CHEK AVIVA PLUS) test strip Use as instructed to check blood sugar 2 times per day dx code E11.9 06/01/15  Yes Reather Littler, MD  hydrALAZINE (APRESOLINE) 25 MG tablet Take 3 tablets (75 mg total) by mouth every 8 (eight) hours. 03/29/15  Yes Belkys A Regalado, MD  Potassium Chloride ER 20 MEQ TBCR Take 1 tablet by mouth daily. 09/07/15  Yes Historical Provider, MD  sitaGLIPtin (JANUVIA) 25 MG tablet Take 1 tablet (25 mg total) by mouth daily. 05/03/15  Yes Reather Littler, MD  terazosin (HYTRIN) 10 MG capsule TAKE 1 CAPSULE (10 MG TOTAL) BY MOUTH AT BEDTIME. 09/07/15  Yes [name], MD  hydrALAZINE (APRESOLINE) 50 MG tablet Take 50 mg  by mouth 3 (three) times daily. 08/31/15   Historical Provider, MD  Multiple Vitamins-Minerals (PRESERVISION AREDS PO) Take 2 tablets by mouth daily.    Historical Provider, MD  pantoprazole (PROTONIX) 40 MG tablet Take 1 tablet (40 mg total) by mouth daily. 03/29/15   Belkys A Regalado, MD  polyethylene glycol (MIRALAX / GLYCOLAX) packet Take 17 g by mouth 2 (two) times daily. 03/29/15   Belkys A Regalado, MD   BP 148/44 mmHg  Pulse 73  Temp(Src) 97.9 F (36.6 C) (Oral)  Resp 13  Ht 5\' 6"  (1.676 m)  Wt 155 lb (70.308 kg)  BMI 25.03 kg/m2  SpO2 95% Physical Exam  Constitutional: He is oriented to person, place, and time. He appears well-developed and well-nourished. He appears distressed.  HENT:  Head: Normocephalic and atraumatic.  Eyes: Conjunctivae and EOM are normal. Pupils are equal, round, and reactive to light.  Pale conjunctiva   Neck: Normal range of motion.  Cardiovascular: Normal rate, regular rhythm, normal heart sounds and intact distal  pulses.  Exam reveals no gallop and no friction rub.   No murmur heard. Pulmonary/Chest: He is in respiratory distress. He has no wheezes. He has rales (diffuse course).  Abdominal: Soft. He exhibits distension. There is tenderness (mild RUQ). There is no guarding.  Musculoskeletal: He exhibits edema (bilateral).  Neurological: He is alert and oriented to person, place, and time.  Skin: Skin is warm and dry. He is not diaphoretic.  Nursing note and vitals reviewed.   ED Course  Procedures (including critical care time) Labs Review Labs Reviewed  CBC WITH DIFFERENTIAL/PLATELET - Abnormal; Notable for the following:    WBC 21.3 (*)    RBC 2.40 (*)    Hemoglobin 7.5 (*)    HCT 22.6 (*)    RDW 18.3 (*)    Neutro Abs 19.0 (*)    Monocytes Absolute 1.4 (*)    All other components within normal limits  BRAIN NATRIURETIC PEPTIDE - Abnormal; Notable for the following:    B Natriuretic Peptide 2200.4 (*)    All other components within  normal limits  COMPREHENSIVE METABOLIC PANEL - Abnormal; Notable for the following:    Potassium 3.1 (*)    CO2 21 (*)    Glucose, Bld 247 (*)    BUN 102 (*)    Creatinine, Ser 3.82 (*)    Total Protein 5.8 (*)    Albumin 3.1 (*)    AST 73 (*)    Total Bilirubin 1.3 (*)    GFR calc non Af Amer 13 (*)    GFR calc Af Amer 16 (*)    Anion gap 19 (*)    All other components within normal limits  PROTIME-INR - Abnormal; Notable for the following:    Prothrombin Time 15.8 (*)    All other components within normal limits  MAGNESIUM - Abnormal; Notable for the following:    Magnesium 2.6 (*)    All other components within normal limits  LACTIC ACID, PLASMA - Abnormal; Notable for the following:    Lactic Acid, Venous 5.0 (*)    All other components within normal limits  TROPONIN I - Abnormal; Notable for the following:    Troponin I 6.15 (*)    All other components within normal limits  TROPONIN I - Abnormal; Notable for the following:    Troponin I 11.37 (*)    All other components within normal limits  CBC WITH DIFFERENTIAL/PLATELET - Abnormal; Notable for the following:    WBC 19.3 (*)    RBC 2.73 (*)    Hemoglobin 8.3 (*)    HCT 25.0 (*)    RDW 17.4 (*)    Neutro Abs 18.5 (*)    Lymphs Abs 0.5 (*)    All other components within normal limits  COMPREHENSIVE METABOLIC PANEL - Abnormal; Notable for the following:    Potassium 2.7 (*)    Glucose, Bld 369 (*)    BUN 106 (*)    Creatinine, Ser 3.84 (*)    Calcium 8.8 (*)    Total Protein 6.4 (*)    Albumin 3.0 (*)    AST 71 (*)    GFR calc non Af Amer 13 (*)    GFR calc Af Amer 15 (*)    Anion gap 17 (*)    All other components within normal limits  LACTIC ACID, PLASMA - Abnormal; Notable for the following:    Lactic Acid, Venous 2.3 (*)    All other components within normal limits  PROTIME-INR -  Abnormal; Notable for the following:    Prothrombin Time 16.8 (*)    All other components within normal limits  URINALYSIS,  ROUTINE W REFLEX MICROSCOPIC (NOT AT [facility]) - Abnormal; Notable for the following:    APPearance CLOUDY (*)    Protein, ur 100 (*)    All other components within normal limits  URINE MICROSCOPIC-ADD ON - Abnormal; Notable for the following:    Casts HYALINE CASTS (*)    All other components within normal limits  GLUCOSE, CAPILLARY - Abnormal; Notable for the following:    Glucose-Capillary 316 (*)    All other components within normal limits  GLUCOSE, CAPILLARY - Abnormal; Notable for the following:    Glucose-Capillary 322 (*)    All other components within normal limits  I-STAT TROPOININ, ED - Abnormal; Notable for the following:    Troponin i, poc 7.08 (*)    All other components within normal limits  I-STAT VENOUS BLOOD GAS, ED - Abnormal; Notable for the following:    pH, Ven 7.411 (*)    pCO2, Ven 37.8 (*)    pO2, Ven 54.0 (*)    All other components within normal limits  I-STAT CG4 LACTIC ACID, ED - Abnormal; Notable for the following:    Lactic Acid, Venous 6.69 (*)    All other components within normal limits  POC OCCULT BLOOD, ED - Abnormal; Notable for the following:    Fecal Occult Bld POSITIVE (*)    All other components within normal limits  CBG MONITORING, ED - Abnormal; Notable for the following:    Glucose-Capillary 227 (*)    All other components within normal limits  I-STAT CHEM 8, ED - Abnormal; Notable for the following:    Potassium 3.2 (*)    BUN 98 (*)    Creatinine, Ser 3.50 (*)    Glucose, Bld 238 (*)    Calcium, Ion 1.04 (*)    Hemoglobin 7.1 (*)    HCT 21.0 (*)    All other components within normal limits  I-STAT ARTERIAL BLOOD GAS, ED - Abnormal; Notable for the following:    pCO2 arterial 31.0 (*)    Bicarbonate 19.6 (*)    Acid-base deficit 4.0 (*)    All other components within normal limits  POCT I-STAT 3, ART BLOOD GAS (G3+) - Abnormal; Notable for the following:    pH, Arterial 7.319 (*)    pCO2 arterial 57.1 (*)    pO2, Arterial 302.0  (*)    Bicarbonate 29.4 (*)    Acid-Base Excess 3.0 (*)    All other components within normal limits  MRSA PCR SCREENING  CULTURE, BLOOD (ROUTINE X 2)  CULTURE, BLOOD (ROUTINE X 2)  CULTURE, RESPIRATORY (NON-EXPECTORATED)  PROCALCITONIN  APTT  STREP PNEUMONIAE URINARY ANTIGEN  PROCALCITONIN  TROPONIN I  LEGIONELLA PNEUMOPHILA SEROGP 1 UR AG  BASIC METABOLIC PANEL  PROCALCITONIN  I-STAT CHEM 8, ED  I-STAT CG4 LACTIC ACID, ED  TYPE AND SCREEN  PREPARE RBC (CROSSMATCH)  ABO/RH    Imaging Review Portable Chest Xray  09/11/2015  CLINICAL DATA:  Status post intubation and central line placement today. EXAM: PORTABLE CHEST 1 VIEW COMPARISON:  Single view of the chest earlier today. FINDINGS: New right subclavian catheter is seen with the tip projecting over the lower superior vena cava. No pneumothorax is identified. Endotracheal tube is also seen with the tip in good position 4 cm above the carina. Right worse than left airspace disease has increased since the study  earlier today. No pneumothorax is identified. Defibrillator pads are in place. IMPRESSION: ET tube and right subclavian catheter project in good position. Negative for pneumothorax. Worsened right greater than left airspace disease could be due to asymmetric edema or pneumonia. Electronically Signed   By: Drusilla Kannerhomas  Dalessio M.D.   On: 09/11/2015 16:39   Dg Chest Port 1 View  09/11/2015  CLINICAL DATA:  Shortness of breath, sepsis EXAM: PORTABLE CHEST 1 VIEW COMPARISON:  09/11/2015 FINDINGS: Defibrillator pads and monitor leads overlie the chest. Asymmetric mid and lower lung airspace process, worse on the right compatible with asymmetric edema or pneumonia. Heart remains enlarged. No developing significant effusion or pneumothorax. Trachea midline. Aortic atherosclerotic. No significant interval change. IMPRESSION: Persistent mid and lower lung asymmetric airspace process worse on the right compatible with pneumonia or asymmetric  edema. Electronically Signed   By: Judie PetitM.  Shick M.D.   On: 09/11/2015 12:38   Dg Chest Port 1 View  09/11/2015  CLINICAL DATA:  Pt arrives from home via [facility]. Pt states he began having SOB last night and wasn't able to lay down and still breathe. Pt is extremely labored upon arrival and is using accessory muscles. Pt has bilateral coarse crackles. Pt states he is in "borderline kidney failure" and quit smoking 30 years ago EXAM: PORTABLE CHEST 1 VIEW COMPARISON:  03/21/2015 FINDINGS: Bilateral airspace opacity has developed in the mid to lower lungs, right greater than left. The upper lungs are clear. Cardiac silhouette is normal in size. No mediastinal or hilar masses. No definite pleural effusion and no pneumothorax. Bony thorax is demineralized but grossly intact. IMPRESSION: Bilateral mid to lower lung zone airspace opacities, right greater than left. This may reflect multifocal pneumonia. Pulmonary edema is possible, with pneumonia favored. Electronically Signed   By: Amie Portlandavid  Ormond M.D.   On: 09/11/2015 10:07   Dg Abd Portable 1v  09/11/2015  CLINICAL DATA:  Check OG tube placement EXAM: PORTABLE ABDOMEN - 1 VIEW COMPARISON:  03/27/2015 FINDINGS: Scattered large and small bowel gas is noted. A gastric catheter is noted within the stomach. No bony abnormality is seen. Left iliac arterial stenting is noted. IMPRESSION: Gastric catheter in satisfactory position. Electronically Signed   By: Alcide CleverMark  Lukens M.D.   On: 09/11/2015 17:33   I have personally reviewed and evaluated these images and lab results as part of my medical decision-making.   EKG Interpretation None       CRITICAL CARE: Acute respiratory failure with hypoxia secondary to acute CHF, NSTEMI, in setting of anemia Performed by: Rhae LernerSchlossman, Sheril Hammond Elizabeth   Total critical care time: 30 minutes  Critical care time was exclusive of separately billable procedures and treating other patients.  Critical care was necessary to treat or  prevent imminent or life-threatening deterioration.  Critical care was time spent personally by me on the following activities: development of treatment plan with patient and/or surrogate as well as nursing, discussions with consultants, evaluation of patient's response to treatment, examination of patient, obtaining history from patient or surrogate, ordering and performing treatments and interventions, ordering and review of laboratory studies, ordering and review of radiographic studies, pulse oximetry and re-evaluation of patient's condition.   MDM   Final diagnoses:  Lactic acidosis  Dyspnea  Gastrointestinal hemorrhage, unspecified gastritis, unspecified gastrointestinal hemorrhage type  Anemia, unspecified anemia type  NSTEMI (non-ST elevated myocardial infarction) (HCC)  Acute congestive heart failure, unspecified congestive heart failure type (HCC)  Acute respiratory failure with hypoxia (HCC)   81 year old male with a  history of hypertension, hypercholesterolemia, CK D stage III, diabetes, asthma, peripheral vascular disease, mild coronary artery disease, presents with concern of dyspnea starting yesterday evening.  Differential diagnosis for dyspnea includes ACS, PE, COPD exacerbation, CHF exacerbation, anemia, pneumonia.  Patient is patient was noted to have a hemoglobin of 6.3, 3 days ago, with prior baseline appearing to be around 11. Conjunctivae are pale, and type and screen and 1 unit of transfusion was ordered on arrival.  Patient with significant dyspnea, work of breathing, and was placed on BiPAP for concern for possible CHF exacerbation vs less likely asthma exacerbation by history.   Chest x-ray was done which showed asymmetric pulmonary edema. EKG was evaluated by me which showed sinus tachycardia with diffuse ST depressions.  Troponin was elevated to 7, and hemoglobin returned at 7.5.  Hemoccult shows brown stool which is hemoccult positive. BNP was elevated to 2000s.   Patient given ASA, however was not initiated on heparin given concern for GI bleed as cause of patient's anemia.  (Hgb 11 in august, 6.4, then today 7.5).  Lasix ordered for concern of acute CHF in setting of NSTEMI secondary to significant anemia. Discussed with Cardiology who agrees with plan.  Discussed with gastroenterology, however do not feel patient has acutely worsening GIB given trend in hgb over last 2 days and at this time is not stable enough for endoscopy. Provided protonix for possible gib.  Given leukocytosis and asymmetric edema, pt covered for CAP with rocephin/azithromycin, however feel clinical hx more consistent with acute CHF exacerbation. Plan to continue BiPAP, transfusion, and diuresis and admit to stepdown unit.   Pt reports he is Full code and would agree to short amount of time of ventilator management if condition worsens.     Alvira Monday, MD 09/11/15 [phone]

## 2015-09-11 NOTE — ED Notes (Signed)
Bladder scan shows in bladder. Pt states he doesn't feel the need to void. Per verbal order of admitting MD pt will get a foley cath placed.

## 2015-09-11 NOTE — Consult Note (Addendum)
Reason for Consult: Guaiac positive anemia Referring Physician: Hospital team  [name] is an 81 y.o. male.  HPI: Patient seen and examined and his hospital computer chart and our office computer chart was reviewed and he had done well since his cholecystostomy tube was removed this summer until recently he did have a bout of constipation and did take a laxative and did go today but he has been distended for a day or 2 but no obvious vomiting and he has not seen any obvious blood in his bowels and he denies any swallowing or upper tract issues and is had a colonoscopy in 2008 and 2013 with some small polyps but no other findings and a may CT showed a hiatal hernia when he had acute cholecystitis but I do not believe he's had an upper tract workup and he is on a baby aspirin at home and does have multiple medical problems but no other specific complaints and a recent hemoglobin had dropped to 6 and he was given Procrit as an outpatient  Past Medical History  Diagnosis Date  . Diabetes mellitus   . Hypertension   . Hypercholesteremia   . Gout   . PVD (peripheral vascular disease) ([place]) 7/05    LCE, known 60% RICA 7/12  . CAD (coronary artery disease) 2002    non critical  . Macular degeneration   . Chronic renal disease, stage III   . Claudication ([place])   . Palpitations     PVCs and PSVT on Holter monitoring  . Bilateral carotid artery disease (HCC)     status post left carotid endarterectomy performed by Dr. Amedeo Plenty 05/12/04  . Anemia     Past Surgical History  Procedure Laterality Date  . 2d echocardiogram  03/31/2008    EF greater than 55%  . Cardiovascular stress test  06/30/2010    Nonischemic. Low risk  . Cerebral angiogram  04/03/2004    High-grade 80% ostial L ICA stenosis. Medical management.  . Abdominal aortogram  09/01/2007    Widely patent renal arteries. Medical management.  . Peripheral vascular angiogram  05/07/2011    No evidence of intracranial occlusions, stenosis,  dissections, or aneurysms seen  . Carotid doppler  06/09/2012    R Vertebral-known occluded vessel, R Bulb/Proximal ICA-moderate to severe amount of plaque w/50-69% diameter reduction, L Subclavian-50-69% diameter reduction, L CEA-demonstrated increased velocities w/o evidence of hemodynamically significant stenosis  . Cardiac catheterization  10/17/2001    No significant CAD, normal LV systolic function.  . Carotid endarterectomy Left July 2005  . Lower extremity angiogram Bilateral 11/01/2014    Procedure: LOWER EXTREMITY ANGIOGRAM;  Surgeon: Lorretta Harp, MD;  Location: [facility] CATH LAB;  Service: Cardiovascular;  Laterality: Bilateral;  . Abdominal angiogram  11/01/2014    Procedure: ABDOMINAL ANGIOGRAM;  Surgeon: Lorretta Harp, MD;  Location: [facility] CATH LAB;  Service: Cardiovascular;;  . Knee surgery    . Hernia repair      Family History  Problem Relation Age of Onset  . Stroke Sister   . Heart disease Brother   . Diabetes Neg Hx     Social History:  reports that he has quit smoking. He has never used smokeless tobacco. He reports that he does not drink alcohol. His drug history is not on file.  Allergies:  Allergies  Allergen Reactions  . Labetalol Swelling    angioedema    Medications: I have reviewed the patient's current medications.  Results for orders placed or performed  during the hospital encounter of 09/11/15 (from the past 48 hour(s))  CBG monitoring, ED     Status: Abnormal   Collection Time: 09/11/15  9:46 AM  Result Value Ref Range   Glucose-Capillary 227 (H) 65 - 99 mg/dL  CBC with Differential     Status: Abnormal   Collection Time: 09/11/15  9:55 AM  Result Value Ref Range   WBC 21.3 (H) 4.0 - 10.5 K/uL   RBC 2.40 (L) 4.22 - 5.81 MIL/uL   Hemoglobin 7.5 (L) 13.0 - 17.0 g/dL   HCT 22.6 (L) 39.0 - 52.0 %   MCV 94.2 78.0 - 100.0 fL   MCH 31.3 26.0 - 34.0 pg   MCHC 33.2 30.0 - 36.0 g/dL   RDW 18.3 (H) 11.5 - 15.5 %   Platelets 216 150 - 400 K/uL    Neutrophils Relative % 89 %   Neutro Abs 19.0 (H) 1.7 - 7.7 K/uL   Lymphocytes Relative 4 %   Lymphs Abs 0.9 0.7 - 4.0 K/uL   Monocytes Relative 7 %   Monocytes Absolute 1.4 (H) 0.1 - 1.0 K/uL   Eosinophils Relative 0 %   Eosinophils Absolute 0.0 0.0 - 0.7 K/uL   Basophils Relative 0 %   Basophils Absolute 0.0 0.0 - 0.1 K/uL  Brain natriuretic peptide     Status: Abnormal   Collection Time: 09/11/15  9:55 AM  Result Value Ref Range   B Natriuretic Peptide 2200.4 (H) 0.0 - 100.0 pg/mL  Comprehensive metabolic panel     Status: Abnormal   Collection Time: 09/11/15  9:55 AM  Result Value Ref Range   Sodium 142 135 - 145 mmol/L   Potassium 3.1 (L) 3.5 - 5.1 mmol/L   Chloride 102 101 - 111 mmol/L   CO2 21 (L) 22 - 32 mmol/L   Glucose, Bld 247 (H) 65 - 99 mg/dL   BUN 102 (H) 6 - 20 mg/dL    Comment: REPEATED TO VERIFY   Creatinine, Ser 3.82 (H) 0.61 - 1.24 mg/dL   Calcium 8.9 8.9 - 10.3 mg/dL   Total Protein 5.8 (L) 6.5 - 8.1 g/dL   Albumin 3.1 (L) 3.5 - 5.0 g/dL   AST 73 (H) 15 - 41 U/L   ALT 22 17 - 63 U/L   Alkaline Phosphatase 82 38 - 126 U/L   Total Bilirubin 1.3 (H) 0.3 - 1.2 mg/dL   GFR calc non Af Amer 13 (L) >60 mL/min   GFR calc Af Amer 16 (L) >60 mL/min    Comment: (NOTE) The eGFR has been calculated using the CKD EPI equation. This calculation has not been validated in all clinical situations. eGFR's persistently <60 mL/min signify possible Chronic Kidney Disease.    Anion gap 19 (H) 5 - 15  Type and screen [place]     Status: None (Preliminary result)   Collection Time: 09/11/15  9:55 AM  Result Value Ref Range   ABO/RH(D) B POS    Antibody Screen NEG    Sample Expiration 09/14/2015    Unit Number [id number]    Blood Component Type RED CELLS,LR    Unit division 00    Status of Unit ISSUED    Transfusion Status OK TO TRANSFUSE    Crossmatch Result Compatible    Unit Number [id number]    Blood Component Type RED CELLS,LR    Unit  division 00    Status of Unit ALLOCATED    Transfusion Status OK TO TRANSFUSE  Crossmatch Result Compatible   Protime-INR     Status: Abnormal   Collection Time: 09/11/15  9:55 AM  Result Value Ref Range   Prothrombin Time 15.8 (H) 11.6 - 15.2 seconds   INR 1.25 0.00 - 1.49  Magnesium     Status: Abnormal   Collection Time: 09/11/15  9:55 AM  Result Value Ref Range   Magnesium 2.6 (H) 1.7 - 2.4 mg/dL  ABO/Rh     Status: None (Preliminary result)   Collection Time: 09/11/15  9:55 AM  Result Value Ref Range   ABO/RH(D) B POS   I-stat troponin, ED     Status: Abnormal   Collection Time: 09/11/15 10:06 AM  Result Value Ref Range   Troponin i, poc 7.08 (HH) 0.00 - 0.08 ng/mL   Comment NOTIFIED PHYSICIAN    Comment 3            Comment: Due to the release kinetics of cTnI, a negative result within the first hours of the onset of symptoms does not rule out myocardial infarction with certainty. If myocardial infarction is still suspected, repeat the test at appropriate intervals.   I-stat chem 8, ed     Status: Abnormal   Collection Time: 09/11/15 10:07 AM  Result Value Ref Range   Sodium 141 135 - 145 mmol/L   Potassium 3.2 (L) 3.5 - 5.1 mmol/L   Chloride 104 101 - 111 mmol/L   BUN 98 (H) 6 - 20 mg/dL   Creatinine, Ser 3.50 (H) 0.61 - 1.24 mg/dL   Glucose, Bld 238 (H) 65 - 99 mg/dL   Calcium, Ion 1.04 (L) 1.13 - 1.30 mmol/L   TCO2 21 0 - 100 mmol/L   Hemoglobin 7.1 (L) 13.0 - 17.0 g/dL   HCT 21.0 (L) 39.0 - 52.0 %  I-Stat CG4 Lactic Acid, ED     Status: Abnormal   Collection Time: 09/11/15 10:08 AM  Result Value Ref Range   Lactic Acid, Venous 6.69 (HH) 0.5 - 2.0 mmol/L   Comment NOTIFIED PHYSICIAN   I-Stat Venous Blood Gas, ED (order at [facility] and [facility] only)     Status: Abnormal   Collection Time: 09/11/15 10:09 AM  Result Value Ref Range   pH, Ven 7.411 (H) 7.250 - 7.300   pCO2, Ven 37.8 (L) 45.0 - 50.0 mmHg   pO2, Ven 54.0 (H) 30.0 - 45.0 mmHg   Bicarbonate 24.0 20.0 -  24.0 mEq/L   TCO2 25 0 - 100 mmol/L   O2 Saturation 88.0 %   Acid-base deficit 1.0 0.0 - 2.0 mmol/L   Patient temperature HIDE    Sample type VENOUS   POC occult blood, ED RN will collect     Status: Abnormal   Collection Time: 09/11/15 10:29 AM  Result Value Ref Range   Fecal Occult Bld POSITIVE (A) NEGATIVE    Dg Chest Port 1 View  09/11/2015  CLINICAL DATA:  Pt arrives from home via [facility]. Pt states he began having SOB last night and wasn't able to lay down and still breathe. Pt is extremely labored upon arrival and is using accessory muscles. Pt has bilateral coarse crackles. Pt states he is in "borderline kidney failure" and quit smoking 30 years ago EXAM: PORTABLE CHEST 1 VIEW COMPARISON:  03/21/2015 FINDINGS: Bilateral airspace opacity has developed in the mid to lower lungs, right greater than left. The upper lungs are clear. Cardiac silhouette is normal in size. No mediastinal or hilar masses. No definite pleural effusion and no  pneumothorax. Bony thorax is demineralized but grossly intact. IMPRESSION: Bilateral mid to lower lung zone airspace opacities, right greater than left. This may reflect multifocal pneumonia. Pulmonary edema is possible, with pneumonia favored. Electronically Signed   By: Lajean Manes M.D.   On: 09/11/2015 10:07    ROS increase shortness of breath increased fatigue no other specific complaints Blood pressure 177/47, pulse 110, temperature 98.8 F (37.1 C), temperature source Axillary, resp. rate 113, height 5' 6"  (1.676 m), weight 70.308 kg (155 lb), SpO2 98 %. Physical Exam patient lying on the stretcher elderly increased respiratory rate no acute distress otherwise exam pertinent for his abdomen being slightly distended no guarding or rebound occasional bowel sounds minimal soreness throughout labs reviewed no GI x-ray  Assessment/Plan: Multiple medical problems including guaiac positive anemia in a patient with chronic renal failure on baby aspirin Plan:  When medically stable consider abdominal x-ray or even CT with oral contrast only and my partner Dr. Amedeo Plenty will check on tomorrow and decide further workup and plans like possibly an endoscopy at some point to rule out any aspirin-induced lesions to explain his guaiac positivity and clear liquid diet is okay with me right now and care with aspirin Plavix and Lovenox since possible bleeding site has not yet been identified  Shante Maysonet E 09/11/2015, 12:15 PM

## 2015-09-12 ENCOUNTER — Inpatient Hospital Stay (HOSPITAL_COMMUNITY): Payer: Medicare Other

## 2015-09-12 DIAGNOSIS — I509 Heart failure, unspecified: Secondary | ICD-10-CM | POA: Insufficient documentation

## 2015-09-12 DIAGNOSIS — I160 Hypertensive urgency: Secondary | ICD-10-CM

## 2015-09-12 DIAGNOSIS — E872 Acidosis, unspecified: Secondary | ICD-10-CM | POA: Insufficient documentation

## 2015-09-12 DIAGNOSIS — E876 Hypokalemia: Secondary | ICD-10-CM

## 2015-09-12 DIAGNOSIS — I214 Non-ST elevation (NSTEMI) myocardial infarction: Secondary | ICD-10-CM

## 2015-09-12 DIAGNOSIS — K922 Gastrointestinal hemorrhage, unspecified: Secondary | ICD-10-CM | POA: Insufficient documentation

## 2015-09-12 DIAGNOSIS — D649 Anemia, unspecified: Secondary | ICD-10-CM | POA: Insufficient documentation

## 2015-09-12 LAB — CBC
HCT: 22.4 % — ABNORMAL LOW (ref 39.0–52.0)
HEMATOCRIT: 22.3 % — AB (ref 39.0–52.0)
HEMOGLOBIN: 7.5 g/dL — AB (ref 13.0–17.0)
Hemoglobin: 7.5 g/dL — ABNORMAL LOW (ref 13.0–17.0)
MCH: 31.3 pg (ref 26.0–34.0)
MCH: 31 pg (ref 26.0–34.0)
MCHC: 33.6 g/dL (ref 30.0–36.0)
MCHC: 33.5 g/dL (ref 30.0–36.0)
MCV: 92.9 fL (ref 78.0–100.0)
MCV: 92.6 fL (ref 78.0–100.0)
PLATELETS: 180 10*3/uL (ref 150–400)
Platelets: 167 10*3/uL (ref 150–400)
RBC: 2.4 MIL/uL — ABNORMAL LOW (ref 4.22–5.81)
RBC: 2.42 MIL/uL — AB (ref 4.22–5.81)
RDW: 18.1 % — ABNORMAL HIGH (ref 11.5–15.5)
RDW: 18 % — ABNORMAL HIGH (ref 11.5–15.5)
WBC: 17.4 10*3/uL — ABNORMAL HIGH (ref 4.0–10.5)
WBC: 19.6 10*3/uL — ABNORMAL HIGH (ref 4.0–10.5)

## 2015-09-12 LAB — INFLUENZA PANEL BY PCR (TYPE A & B)
H1N1 flu by pcr: NOT DETECTED
INFLBPCR: NEGATIVE
Influenza A By PCR: NEGATIVE

## 2015-09-12 LAB — GLUCOSE, CAPILLARY
GLUCOSE-CAPILLARY: 104 mg/dL — AB (ref 65–99)
GLUCOSE-CAPILLARY: 176 mg/dL — AB (ref 65–99)
GLUCOSE-CAPILLARY: 175 mg/dL — AB (ref 65–99)
Glucose-Capillary: 163 mg/dL — ABNORMAL HIGH (ref 65–99)
Glucose-Capillary: 214 mg/dL — ABNORMAL HIGH (ref 65–99)
Glucose-Capillary: 148 mg/dL — ABNORMAL HIGH (ref 65–99)

## 2015-09-12 LAB — MAGNESIUM: Magnesium: 2.7 mg/dL — ABNORMAL HIGH (ref 1.7–2.4)

## 2015-09-12 LAB — BASIC METABOLIC PANEL
Anion gap: 11 (ref 5–15)
BUN: 114 mg/dL — AB (ref 6–20)
CO2: 28 mmol/L (ref 22–32)
CREATININE: 4.38 mg/dL — AB (ref 0.61–1.24)
Calcium: 8.6 mg/dL — ABNORMAL LOW (ref 8.9–10.3)
Chloride: 105 mmol/L (ref 101–111)
GFR calc Af Amer: 13 mL/min — ABNORMAL LOW (ref >60)
GFR, EST NON AFRICAN AMERICAN: 11 mL/min — AB (ref >60)
Glucose, Bld: 130 mg/dL — ABNORMAL HIGH (ref 65–99)
POTASSIUM: 3.7 mmol/L (ref 3.5–5.1)
SODIUM: 144 mmol/L (ref 135–145)

## 2015-09-12 LAB — LEGIONELLA PNEUMOPHILA SEROGP 1 UR AG: L. pneumophila Serogp 1 Ur Ag: NEGATIVE

## 2015-09-12 LAB — STREP PNEUMONIAE URINARY ANTIGEN: Strep Pneumo Urinary Antigen: NEGATIVE

## 2015-09-12 LAB — TROPONIN I: TROPONIN I: 18.51 ng/mL — AB (ref <0.031)

## 2015-09-12 LAB — LACTIC ACID, PLASMA: Lactic Acid, Venous: 1.1 mmol/L (ref 0.5–2.0)

## 2015-09-12 LAB — PROCALCITONIN: PROCALCITONIN: 8.57 ng/mL

## 2015-09-12 LAB — HEPARIN LEVEL (UNFRACTIONATED): Heparin Unfractionated: 0.12 IU/mL — ABNORMAL LOW (ref 0.30–0.70)

## 2015-09-12 MED ORDER — FUROSEMIDE 10 MG/ML IJ SOLN
120.0000 mg | Freq: Once | INTRAVENOUS | Status: AC
  Administered 2015-09-12: 120 mg via INTRAVENOUS
  Filled 2015-09-12: qty 12

## 2015-09-12 MED ORDER — ATORVASTATIN CALCIUM 80 MG PO TABS
80.0000 mg | ORAL_TABLET | Freq: Every day | ORAL | Status: DC
  Administered 2015-09-12 – 2015-09-17 (×4): 80 mg
  Filled 2015-09-12 (×4): qty 1

## 2015-09-12 MED ORDER — METOLAZONE 2.5 MG PO TABS
2.5000 mg | ORAL_TABLET | Freq: Once | ORAL | Status: AC
  Administered 2015-09-12: 2.5 mg via ORAL
  Filled 2015-09-12: qty 1

## 2015-09-12 MED ORDER — ANTISEPTIC ORAL RINSE SOLUTION (CORINZ)
7.0000 mL | OROMUCOSAL | Status: DC
  Administered 2015-09-12 – 2015-09-18 (×62): 7 mL via OROMUCOSAL

## 2015-09-12 MED ORDER — FUROSEMIDE 10 MG/ML IJ SOLN
40.0000 mg | Freq: Once | INTRAMUSCULAR | Status: AC
  Administered 2015-09-12: 40 mg via INTRAVENOUS
  Filled 2015-09-12: qty 4

## 2015-09-12 MED ORDER — FUROSEMIDE 10 MG/ML IJ SOLN
80.0000 mg | Freq: Once | INTRAMUSCULAR | Status: AC
Start: 2015-09-12 — End: 2015-09-12
  Administered 2015-09-12: 80 mg via INTRAVENOUS
  Filled 2015-09-12: qty 8

## 2015-09-12 MED ORDER — METOPROLOL TARTRATE 1 MG/ML IV SOLN
5.0000 mg | Freq: Four times a day (QID) | INTRAVENOUS | Status: DC
  Administered 2015-09-12 – 2015-09-13 (×4): 5 mg via INTRAVENOUS
  Filled 2015-09-12 (×6): qty 5

## 2015-09-12 MED ORDER — HEPARIN (PORCINE) IN NACL 100-0.45 UNIT/ML-% IJ SOLN
1050.0000 [IU]/h | INTRAMUSCULAR | Status: DC
  Administered 2015-09-12: 800 [IU]/h via INTRAVENOUS
  Administered 2015-09-13: 1100 [IU]/h via INTRAVENOUS
  Filled 2015-09-12 (×2): qty 250

## 2015-09-12 MED ORDER — POTASSIUM CHLORIDE 20 MEQ/15ML (10%) PO SOLN
30.0000 meq | Freq: Once | ORAL | Status: AC
  Administered 2015-09-12: 30 meq
  Filled 2015-09-12: qty 30

## 2015-09-12 NOTE — Progress Notes (Signed)
Urine outpt is 190 after 120 of lasix.  Discussed with Drs. Delton SeeNelson and AuroraSkains- will give metolazone and 80 mg IV lasix. Now.  Will need nephrology consult in AM.

## 2015-09-12 NOTE — Progress Notes (Signed)
eLink Physician-Brief Progress Note Patient Name: [name] DOB: 05/16/1930 MRN: [id number]   Date of Service  09/12/2015  HPI/Events of Note  BP lower, came with HTN crisis On ntg  eICU Interventions  reduec NTG     Intervention Category Major Interventions: Hypotension - evaluation and management  Nelda BucksFEINSTEIN,[name] J. 09/12/2015, 2:54 AM

## 2015-09-12 NOTE — Progress Notes (Signed)
Patient has not had any urine output this shift. Received lasix 40mg  at 0927 this morning. Notified Dr. Isabella BowensKrall. Will continue to monitor.

## 2015-09-12 NOTE — Progress Notes (Signed)
Brief Progress Note  Patient noted to be anuric despite Lasix 40mg  once this morning. CVP 12 (11 this morning). CXR from this morning with bilateral airspace opacities.  Plan: -Will give additional Lasix 120mg  x 1 now. -Continue to monitor  Discussed with Dr. Delton SeeNelson (cardiology)  Griffin BasilJennifer Krall, MD  Internal Medicine PGY-2

## 2015-09-12 NOTE — Progress Notes (Signed)
ANTICOAGULATION CONSULT NOTE - Follow Up Consult  Pharmacy Consult for Heparin Indication: NSTEMI  Allergies  Allergen Reactions  . Labetalol Swelling    angioedema    Patient Measurements: Height:  (167.6 cm) Weight: 160 lb 3.2 oz (72.666 kg) IBW/kg (Calculated) : 63.8   Vital Signs: Temp: 98.4 F (36.9 C) (11/14 2000) Temp Source: Oral (11/14 2000) BP: 138/55 mmHg (11/14 2000) Pulse Rate: 83 (11/14 1928)  Labs:  Recent Labs  09/11/15 0955 09/11/15 1007 09/11/15 1450 09/11/15 2020 09/12/15 0235 09/12/15 0940 09/12/15 1635 09/12/15 2011  HGB 7.5* 7.1* 8.3*  --   --  7.5* 7.5*  --   HCT 22.6* 21.0* 25.0*  --   --  22.3* 22.4*  --   PLT 216  --  195  --   --  167 180  --   APTT  --   --  31  --   --   --   --   --   LABPROT 15.8*  --  16.8*  --   --   --   --   --   INR 1.25  --  1.35  --   --   --   --   --   HEPARINUNFRC  --   --   --   --   --   --   --  0.12*  CREATININE 3.82* 3.50* 3.84*  --  4.38*  --   --   --   TROPONINI  --   --  6.15* 11.37* 18.51*  --   --   --     Estimated Creatinine Clearance: 11.7 mL/min (by C-G formula based on Cr of 4.38).   Medical History: Past Medical History  Diagnosis Date  . Diabetes mellitus   . Hypertension   . Hypercholesteremia   . Gout   . PVD (peripheral vascular disease) (H[facility]) 7/05    LCE, known 60% RICA 7/12  . CAD (coronary artery disease) 2002    non critical  . Macular degeneration   . Chronic renal disease, stage III   . Claudication (H[facility])   . Palpitations     PVCs and PSVT on Holter monitoring  . Bilateral carotid artery disease (HCC)     status post left carotid endarterectomy performed by Dr. Madilyn Fireman 05/12/04  . Anemia     Medications:  Scheduled:  . antiseptic oral rinse  7 mL Mouth Rinse Q2H  . atorvastatin  80 mg Per Tube q1800  . azithromycin  500 mg Intravenous Q24H  . cefTRIAXone (ROCEPHIN)  IV  1 g Intravenous Q24H  . chlorhexidine gluconate  15 mL Mouth Rinse BID  . cloNIDine   0.1 mg Per Tube TID  . clopidogrel  75 mg Per Tube Daily  . hydrALAZINE  50 mg Per Tube TID  . insulin aspart  0-9 Units Subcutaneous Q4H  . ipratropium-albuterol  3 mL Nebulization Q6H  . metoprolol  5 mg Intravenous 4 times per day  . pantoprazole (PROTONIX) IV  40 mg Intravenous Q12H  . terazosin  10 mg Per Tube QHS    Assessment: 84 yom with uptrending troponins and likely diagnosis of NSTEMI vs demand ischemia. No urgent indication for cath but will start heparin drip. Patient does have  suspected GI bleed (heme + stools) and RN reported oozing from patient's central line overnight. Hgb down to 7.5 today. No active bleeding this afternoon per GI - continue PPI Heparin drip started 800 uts/hr HL  less that goal at 0.12 Patient is not on anticoagulation prior to admission. He did not receive lovenox earlier as this was cancelled d/t his possible GIB.  Goal of Therapy:  Heparin level 0.3-0.7 units/ml Monitor platelets by anticoagulation protocol: Yes   Plan:  - No heparin bolus  - Increase heparin infusion at 1000 units/hr - Check 8-hr heparin level - Monitor CBC every 8 hours  Leota SauersLisa Shanigua Gibb Pharm.D. CPP, BCPS Clinical Pharmacist [phone] 09/12/2015 9:00 PM

## 2015-09-12 NOTE — Care Management Note (Signed)
Case Management Note  Patient Details  Name: Pasty ArchRoy C Pacey MRN: [id number] Date of Birth: 09/21/1930  Subjective/Objective:      Adm w sepsis             Action/Plan: lives w fam, pcp dr Philipp Deputykim shaw  Expected Discharge Date:                  Expected Discharge Plan:     In-House Referral:     Discharge planning Services     Post Acute Care Choice:    Choice offered to:     DME Arranged:    DME Agency:     HH Arranged:    HH Agency:     Status of Service:     Medicare Important Message Given:    Date Medicare IM Given:    Medicare IM give by:    Date Additional Medicare IM Given:    Additional Medicare Important Message give by:     If discussed at Long Length of Stay Meetings, dates discussed:    Additional Comments: ur review done  Hanley HaysDowell, [place] Weekly T, RN 09/12/2015, 8:02 AM

## 2015-09-12 NOTE — Progress Notes (Signed)
Pt still intubated, no gross blood output reported. Cont to rec empiric PPI, monitor stools and hb. Will follow with you

## 2015-09-12 NOTE — Progress Notes (Signed)
  Echocardiogram 2D Echocardiogram has been performed.  Dorothey BasemanReel, Hargun Spurling M 09/12/2015, 9:01 AM

## 2015-09-12 NOTE — Progress Notes (Signed)
Called regarding rising troponin.  [name]'s troponin has increased from 6 to 11 to 8918.  He continues to deny chest pain, though he is sedated and receiving IV fentanyl .  Echo showed a hyperdynamic LV with no wall motion abnormalities and grade 1 diastolic dysfunction.  Repeat EKG shows sinus rhythm with PVCs and PACs, but no ST elevation.  It does show inferior and lateral ST depression.  Will repeat EKG.  Given that there is also concern for GI bleed requiring transfusion, we will not start heparin at this time.  Will get a limited repeat echo for LVEF and wall motion assessment in AM.  Kohler Pellerito C. Duke Salviaandolph, MD 09/12/2015 3:55 AM

## 2015-09-12 NOTE — Progress Notes (Addendum)
Inpatient Diabetes Program Recommendations  AACE/ADA: New Consensus Statement on Inpatient Glycemic Control (2015)  Target Ranges:  Prepandial:   less than 140 mg/dL      Peak postprandial:   less than 180 mg/dL (1-2 hours)      Critically ill patients:  140 - 180 mg/dL   Review of Glycemic Control  Diabetes history: DM 2 with CKD Outpatient Diabetes medications: Januvia 25 mg Current orders for Inpatient glycemic control: sensitive correction q 4 hrs  Inpatient Diabetes Program Recommendations: Glucose now controlled. May want to consider using the ICU Hyperglycemia order set if tube feeds or steroids are needed with potential for hyperglycemia in the setting of CKD.  Thank you Lenor CoffinAnn Yazeed Pryer, RN, MSN, CDE  Diabetes Inpatient Program Office: [phone] Pager: [phone] 8:00 am to 5:00 pm

## 2015-09-12 NOTE — Progress Notes (Signed)
Dr Duke Salviaandolph with Cardiology notified of pt continuing increasing troponin-11 to 18. Pt denies chest pain. No new orders at this time will continue to monitor.

## 2015-09-12 NOTE — Progress Notes (Signed)
ANTICOAGULATION CONSULT NOTE - Initial Consult  Pharmacy Consult for Heparin Indication: NSTEMI  Allergies  Allergen Reactions  . Labetalol Swelling    angioedema    Patient Measurements: Height:  (167.6 cm) Weight: 160 lb 3.2 oz (72.666 kg) IBW/kg (Calculated) : 63.8   Vital Signs: Temp: 98.7 F (37.1 C) (11/14 0822) Temp Source: Oral (11/14 0822) BP: 135/44 mmHg (11/14 1100) Pulse Rate: 98 (11/14 0822)  Labs:  Recent Labs  09/11/15 0955 09/11/15 1007 09/11/15 1450 09/11/15 2020 09/12/15 0235 09/12/15 0940  HGB 7.5* 7.1* 8.3*  --   --  7.5*  HCT 22.6* 21.0* 25.0*  --   --  22.3*  PLT 216  --  195  --   --  167  APTT  --   --  31  --   --   --   LABPROT 15.8*  --  16.8*  --   --   --   INR 1.25  --  1.35  --   --   --   CREATININE 3.82* 3.50* 3.84*  --  4.38*  --   TROPONINI  --   --  6.15* 11.37* 18.51*  --     Estimated Creatinine Clearance: 11.7 mL/min (by C-G formula based on Cr of 4.38).   Medical History: Past Medical History  Diagnosis Date  . Diabetes mellitus   . Hypertension   . Hypercholesteremia   . Gout   . PVD (peripheral vascular disease) ([facility]) 7/05    LCE, known 60% RICA 7/12  . CAD (coronary artery disease) 2002    non critical  . Macular degeneration   . Chronic renal disease, stage III   . Claudication ([facility])   . Palpitations     PVCs and PSVT on Holter monitoring  . Bilateral carotid artery disease ([facility])     status post left carotid endarterectomy performed by Dr. Madilyn Fireman 05/12/04  . Anemia     Medications:  Scheduled:  . antiseptic oral rinse  7 mL Mouth Rinse Q2H  . atorvastatin  80 mg Per Tube q1800  . azithromycin  500 mg Intravenous Q24H  . cefTRIAXone (ROCEPHIN)  IV  1 g Intravenous Q24H  . chlorhexidine gluconate  15 mL Mouth Rinse BID  . cloNIDine  0.1 mg Per Tube TID  . clopidogrel  75 mg Per Tube Daily  . hydrALAZINE  50 mg Per Tube TID  . insulin aspart  0-9 Units Subcutaneous Q4H  . ipratropium-albuterol   3 mL Nebulization Q6H  . metoprolol  5 mg Intravenous 4 times per day  . pantoprazole (PROTONIX) IV  40 mg Intravenous Q12H  . terazosin  10 mg Per Tube QHS    Assessment: 84 yom with uptrending troponins and likely diagnosis of NSTEMI vs demand ischemia. No urgent indication for cath but will start heparin drip. Patient does have  suspected GI bleed (heme + stools) and RN reported oozing from patient's central line overnight. Hgb down to 7.5 today. Given concern for active bleeding, will omit heparin bolus and closely monitor Hgb.  Patient is not on anticoagulation prior to admission. He did not receive lovenox earlier as this was cancelled d/t his possible GIB.  Goal of Therapy:  Heparin level 0.3-0.7 units/ml Monitor platelets by anticoagulation protocol: Yes   Plan:  - No heparin bolus  - Start heparin infusion at 800 units/hr - Check 8-hr heparin level - Monitor CBC every 8 hours  Scarlette Ar 09/12/2015,12:00 PM   Sheppard Coil  PharmD., BCPS Clinical Pharmacist Pager [phone] 09/12/2015 1:41 PM

## 2015-09-12 NOTE — Progress Notes (Signed)
Subjective:  Intubated, sedated  Objective:  Vital Signs in the last 24 hours: Temp:  [97.6 F (36.4 C)-98.8 F (37.1 C)] 98.2 F (36.8 C) (11/14 0400) Pulse Rate:  [73-110] 94 (11/14 0342) Resp:  [10-113] 14 (11/14 0700) BP: (107-187)/(40-67) 129/47 mmHg (11/14 0700) SpO2:  [90 %-100 %] 99 % (11/14 0700) FiO2 (%):  [50 %-100 %] 50 % (11/14 0342) Weight:  [155 lb (70.308 kg)-160 lb 3.2 oz (72.666 kg)] 160 lb 3.2 oz (72.666 kg) (11/14 0448)  Intake/Output from previous day: 11/13 0701 - 11/14 0700 In: 1168.7 [I.V.:264.7; Blood:742; NG/GT:100; IV Piggyback:62] Out: 610 [Urine:610]  Physical Exam: NAD, sedated HEENT: normal Neck: supple Lungs: CTAB CV: RRR without murmur or gallop Abd: soft, NT, Positive BS, no hepatomegaly Ext: no C/C/E, distal pulses intact and equal Skin: warm/dry no rash  Lab Results:  Recent Labs  09/11/15 0955 09/11/15 1007 09/11/15 1450  WBC 21.3*  --  19.3*  HGB 7.5* 7.1* 8.3*  PLT 216  --  195    Recent Labs  09/11/15 1450 09/12/15 0235  NA 140 144  K 2.7* 3.7  CL 101 105  CO2 22 28  GLUCOSE 369* 130*  BUN 106* 114*  CREATININE 3.84* 4.38*    Recent Labs  09/11/15 2020 09/12/15 0235  TROPONINI 11.37* 18.51*    Cardiac Studies: Echo 09/11/2015: LV EF 65-70%, mild LVH, grade 1 diastolic dysfunction, PA peak pressure 65 mmHg, trivial pericardial effusion  Tele: NSR, nonsustained SVT  Assessment/Plan:  81 year old man with history of DM2, HTN, HLD, PVD, CAD, CKD stage 4, carotid artery disease, chronic anemia presenting with dyspnea x 2 days.   Elevated troponins, CAD: Troponin 6.15 on admission now up to 18.51. No wall motion abnormalities and LV EF 65-70% on echo 11/13. Repeat EKG with sinus rhythm, mild depression in II, aVF unchanged from previous EKG. Prelim read of limited echo with what appears to be inferolateral wall motion abnormality, LV EF around 50-55%. -No urgent indication for cath at this moment -Will  start heparin gtt and follow hgb closely. -Metoprolol increased to  IV q6hr.  -Atorvastatin  daily. -Plavix  daily  Acute diastolic CHF: CVP 11 this morning. Received  IV lasix yesterday morning and  IV lasix yesterday afternoon. -Repeat chest xr. -Lasix  x 1 this morning.  CAP, septic shock, acute hypoxic respiratory failure: CXR on admission with bilateral mid to lower lung zone airspace opacities. Intubated on 11/13. Lactic acidosis on admission to 6.69 which has trended down to 2.3. -Rocephin and azithromycin per PCCM  Acute on chronic anemia, GI bleed: Hgb 7.5 on admission. S/p 1u pRBC on 11/13. Hgb 8.3 post transfusion. -GI following, further workup pending. -Follow hgb closely while on heparin gtt.  CKD stage 4: Nephrologist is Dr. Darrick Penna. Baseline creatinine around 4. Creatinine on admission 3.82.  DM2: -SSI  Griffin Basil, M.D. 09/12/2015, 7:40 AM  The patient was seen, examined and discussed with the IM resident Griffin Basil, M.D. and I agree with the above.   Mr Bartnick is a 81 year old male with h/o CKD stage 4, HTN admitted with acute hypoxic respiratory failure requiring intubation, found to have elevated lactic acid, leukocytosis possibly due to pneumonia and severe anemia suspicious for GIB. He is also in acute diastolic CHF and elevated troponin that was originally believed to be demand ischemia, however significant uptrending of troponin 6-->11-->18 that qualifies for NSTEMI. The patient denies chest pain. Yesterday's echo showed hyperdynamic LVEF, there appears to  be mild basal and mid inferior wall ischemia, but overall LVEF is preserved. Considering his overall sepstic shock, CKD stage 4 we will hold off cath right now, we will start Heparin drip fr NSTEMI and monitor Hb Q8H. Start atorvastatin 80 mg po daily via NG tube and increase iv metoprolol to 5 mg po Q6H.   Lars MassonELSON, Ahrianna Siglin H 09/12/2015

## 2015-09-12 NOTE — Progress Notes (Signed)
PULMONARY / CRITICAL CARE MEDICINE   Name: [name] MRN: [id number] DOB: [birth year]    ADMISSION DATE:  09/11/2015 CONSULTATION DATE:  11/13  REFERRING MD :  Butler Denmark   CHIEF COMPLAINT:  Acute Respiratory Failure  INITIAL PRESENTATION: 81 y/o male w/ stage IV CKD , HTN and CAD. Presents to the ED on 11/13 w/ acute resp failure in setting of acute pulmonary edema, hypertensive crisis and NSTEMI +/- CAP CCM asked to assess given degree of respiratory failure and ongoing ischemia w/ concern the pt would require intubation.   STUDIES:  ECHO 11/13 - Systolic function wasvigorous. The estimated ejection fraction was in the range of 65% to 70%. Doppler parameters are consistent with abnormal leftventricular relaxation (grade 1 diastolic dysfunction). - Left atrium: The atrium was mildly dilated. - Pulmonary arteries: PA peak pressure: 65 mm Hg (S). Port CXR 11/13 - ETT & CVC in good position. Bilateral lower lung opacities.  SIGNIFICANT EVENTS: 11/13 - Admit  11/13 - Intubation   HISTORY OF PRESENT ILLNESS:   81 year old male w/sig h/o hypertension, diabetes, symptomatic bradycardia, peripheral vascular disease, stage IV chronic kidney disease, anemia of chronic kidney disease on Procrit, and dyslipidemia. Patient reported to the [facility] 11/13 w/ CC: 2 days of shortness of breath.  He's not had any cough or chest pain he's had subjective chills but no fevers. He reported some increase in lower extremity edema. Because of significant shortness of breath he presented to the ER today the EMS. Upon EMS arrival to the home patient had increased work of breathing with extremely labored effort and use of accessory muscles. On exam he was found to have bilateral coarse crackles. Pulse oximetry on room air was 80%. Patient received 2 DuoNeb's without any improvement in symptoms. BiPAP was initiated by EMS. In ED CXR showed R>L airspace disease. Admitted to the IM service w/ working dx of acute resp  failure in setting of pulmonary edema in setting of NSTEMI +/- PNA. PCCM asked to see re: need for intubation given AMI.   SUBJECTIVE: Patient responding to questions with nods. Denies any pain or difficulty breathing at this time. Remains on ventilator. Troponin I increasing overnight and cardiology made aware.  REVIEW OF SYSTEMS:  Unable to obtain secondary to intubation.  VITAL SIGNS: Temp:  [97.6 F (36.4 C)-98.8 F (37.1 C)] 98.7 F (37.1 C) (11/14 0822) Pulse Rate:  [73-110] 98 (11/14 0822) Resp:  [10-113] 21 (11/14 0822) BP: (107-187)/(40-67) 137/48 mmHg (11/14 0822) SpO2:  [90 %-100 %] 97 % (11/14 0822) FiO2 (%):  [50 %-100 %] 60 % (11/14 0743) Weight:  [70.308 kg (155 lb)-72.666 kg (160 lb 3.2 oz)] 72.666 kg (160 lb 3.2 oz) (11/14 0448) HEMODYNAMICS: CVP:  [7 mmHg-11 mmHg] 11 mmHg VENTILATOR SETTINGS: Vent Mode:  [-] PRVC FiO2 (%):  [50 %-100 %] 60 % Set Rate:  [14 bmp] 14 bmp Vt Set:  [500 mL-510 mL] 510 mL PEEP:  [5 cmH20] 5 cmH20 Plateau Pressure:  [14 cmH20-20 cmH20] 20 cmH20 INTAKE / OUTPUT:  Intake/Output Summary (Last 24 hours) at 09/12/15 0855 Last data filed at 09/12/15 0700  Gross per 24 hour  Intake 1168.66 ml  Output    610 ml  Net 558.66 ml    PHYSICAL EXAMINATION: General:  Awake. No acute distress. No family at bedside.  Integument:  Warm & dry. No rash on exposed skin. Coagulated blood noted around the insertion site for the right subclavian. HEENT: No scleral injection or icterus. Endotracheal tube in  place. PERRL. Cardiovascular:  Regular rate. No appreciable JVD. NSR on tele. Pulmonary:  Good aeration & clear to auscultation bilaterally. Symmetric chest wall rise on ventilator. Abdomen: Soft. Normal bowel sounds. Nondistended.  Neurological: Currently on fentanyl drip. Patient following commands appropriately. Moving all 4 extremities equally with symmetric strength. Grossly nonfocal.   LABS:  CBC  Recent Labs Lab 09/11/15 0955  09/11/15 1007 09/11/15 1450  WBC 21.3*  --  19.3*  HGB 7.5* 7.1* 8.3*  HCT 22.6* 21.0* 25.0*  PLT 216  --  195   Coag's  Recent Labs Lab 09/11/15 0955 09/11/15 1450  APTT  --  31  INR 1.25 1.35   BMET  Recent Labs Lab 09/11/15 0955 09/11/15 1007 09/11/15 1450 09/12/15 0235  NA 142 141 140 144  K 3.1* 3.2* 2.7* 3.7  CL 102 104 101 105  CO2 21*  --  22 28  BUN 102* 98* 106* 114*  CREATININE 3.82* 3.50* 3.84* 4.38*  GLUCOSE 247* 238* 369* 130*   Electrolytes  Recent Labs Lab 09/11/15 0955 09/11/15 1450 09/12/15 0235  CALCIUM 8.9 8.8* 8.6*  MG 2.6*  --   --    Sepsis Markers  Recent Labs Lab 09/11/15 1008 09/11/15 1450 09/11/15 1706 09/11/15 2020 09/12/15 0235  LATICACIDVEN 6.69* 5.0*  --  2.3*  --   PROCALCITON  --  5.11 5.48  --  8.57   ABG  Recent Labs Lab 09/11/15 1242 09/11/15 1712  PHART 7.408 7.319*  PCO2ART 31.0* 57.1*  PO2ART 84.0 302.0*   Liver Enzymes  Recent Labs Lab 09/11/15 0955 09/11/15 1450  AST 73* 71*  ALT 22 23  ALKPHOS 82 84  BILITOT 1.3* 1.0  ALBUMIN 3.1* 3.0*   Cardiac Enzymes  Recent Labs Lab 09/11/15 1450 09/11/15 2020 09/12/15 0235  TROPONINI 6.15* 11.37* 18.51*   Glucose  Recent Labs Lab 09/11/15 0946 09/11/15 1738 09/11/15 1954 09/12/15 0042 09/12/15 0424  GLUCAP 227* 316* 322* 163* 104*    Imaging Portable Chest Xray  09/11/2015  CLINICAL DATA:  Status post intubation and central line placement today. EXAM: PORTABLE CHEST 1 VIEW COMPARISON:  Single view of the chest earlier today. FINDINGS: New right subclavian catheter is seen with the tip projecting over the lower superior vena cava. No pneumothorax is identified. Endotracheal tube is also seen with the tip in good position 4 cm above the carina. Right worse than left airspace disease has increased since the study earlier today. No pneumothorax is identified. Defibrillator pads are in place. IMPRESSION: ET tube and right subclavian catheter  project in good position. Negative for pneumothorax. Worsened right greater than left airspace disease could be due to asymmetric edema or pneumonia. Electronically Signed   By: Drusilla Kannerhomas  Dalessio M.D.   On: 09/11/2015 16:39   Dg Chest Port 1 View  09/11/2015  CLINICAL DATA:  Shortness of breath, sepsis EXAM: PORTABLE CHEST 1 VIEW COMPARISON:  09/11/2015 FINDINGS: Defibrillator pads and monitor leads overlie the chest. Asymmetric mid and lower lung airspace process, worse on the right compatible with asymmetric edema or pneumonia. Heart remains enlarged. No developing significant effusion or pneumothorax. Trachea midline. Aortic atherosclerotic. No significant interval change. IMPRESSION: Persistent mid and lower lung asymmetric airspace process worse on the right compatible with pneumonia or asymmetric edema. Electronically Signed   By: Judie PetitM.  Shick M.D.   On: 09/11/2015 12:38   Dg Chest Port 1 View  09/11/2015  CLINICAL DATA:  Pt arrives from home via [facility]. Pt states he began  having SOB last night and wasn't able to lay down and still breathe. Pt is extremely labored upon arrival and is using accessory muscles. Pt has bilateral coarse crackles. Pt states he is in "borderline kidney failure" and quit smoking 30 years ago EXAM: PORTABLE CHEST 1 VIEW COMPARISON:  03/21/2015 FINDINGS: Bilateral airspace opacity has developed in the mid to lower lungs, right greater than left. The upper lungs are clear. Cardiac silhouette is normal in size. No mediastinal or hilar masses. No definite pleural effusion and no pneumothorax. Bony thorax is demineralized but grossly intact. IMPRESSION: Bilateral mid to lower lung zone airspace opacities, right greater than left. This may reflect multifocal pneumonia. Pulmonary edema is possible, with pneumonia favored. Electronically Signed   By: Amie Portland M.D.   On: 09/11/2015 10:07   Dg Abd Portable 1v  09/11/2015  CLINICAL DATA:  Check OG tube placement EXAM: PORTABLE ABDOMEN  - 1 VIEW COMPARISON:  03/27/2015 FINDINGS: Scattered large and small bowel gas is noted. A gastric catheter is noted within the stomach. No bony abnormality is seen. Left iliac arterial stenting is noted. IMPRESSION: Gastric catheter in satisfactory position. Electronically Signed   By: Alcide Clever M.D.   On: 09/11/2015 17:33     ASSESSMENT / PLAN:  PULMONARY OETT 11/13>>> A: Acute Hypoxic respiratory Failure - Pulmonary edema vs Severe CAP Pulmonary Edema - Decompensated Diastolic CHF Possible Severe CAP - PCT elevated & Ctx pending  P:   Continue short term intubation Weaning FiO2 to 0.5 for Sat >94% PAD protocol  Duoneb q6hr See ID section  See Cardiovascular section for diuresis  CARDIOVASCULAR A:  NSTEMI vs Demand Ischemia - H/O CAD. Troponin I increasing. Limited TTE pending. Acute Decompensation Diastolic CHF Hypertensive Crisis - Resolved. Now off Nitro gtt. H/O PVD & Carotid Artery Disease  P:  Holding heparin gtt given Anemia w/ Probable GIB Transfuse for goal hgb > 8 CVP from CVL Monitor on telemetry Intermittent Lasix per Cardiology Cardiology repeating limited TTE Lopressor  IV q6hr Hydralazine  per tube tid Plavix via tube daily ASA  via tube qhs Lipitor  per tube qhs Clonidine 0.1mg  via tube tid  RENAL A:   Acute on Chronic Renal Failure Lactic acidosis - Improving. Hypokalemia - Improving. Lactic Acidosis - Improving.  P:   Maximize Cardiac Output with Diuresis Replace KCl via tube 40 mEq Strict I&O Renal dose meds  Monitoring UOP with Foley Trending renal function with daily BUN/Creatinine Monitoring electrolytes daily Continue Terazosin Trending Lactic Acid daily Checking Magnesium now KCl 30 mEq via tube once this morning  GASTROINTESTINAL A:   Heme + stools   P:   NPO GI consulted but patient too unstable Protonix  IV q12hr  HEMATOLOGIC A:   Anemia of chronic disease - Hgb improving. S/P transfusion 1u  PRBC 11/13. Heme + stools  Leukocytosis - Multifactorial.  P:  Trending Hgb w/ CBC q8hr Trending Leukcytosis w/ CBC SCDs  INFECTIOUS A:   Possible Sepsis Possible Severe CAP  P:   Procalcitonin Algorithm Checking Urine Strep & Legionella Ag Influenza PCR  Ctx: Trach Asp 11/14>>> Blood x2 11/13>>>  Abx: Azithromycin 11/13>>> Rocephin 11/13>>>  ENDOCRINE A:   H/O DM - BG controlled  P:   Accu-Checks q4hr Low dose SSI Protocol  NEUROLOGIC A:   No acute issues Sedation on ventilator  P:   RASS goal: 0 to -1 Fentanyl IV PRN & gtt  FAMILY  - Updates: No family at bedside updated this morning. -  Inter-disciplinary family meet or Palliative Care meeting due by:  11/20  TODAY'S SUMMARY: 81 year old male with known history of coronary artery disease & peripheral vascular disease. Presenting with symptomatic anemia & hypertensive crisis. Patient possibly has pneumonia given his elevated Procalcitonin. However, his cardiac biomarkers continue to trend upward. With his anemia and heme positive stools we are unable to continue with a heparin drip. Further management per cardiology regarding elevated cardiac biomarkers with limited transthoracic echocardiogram pending. Continuing to wean FiO2 for saturation greater than 94%. Sending additional infectious workup this morning while continuing broad-spectrum antibiotic coverage with Rocephin & azithromycin.   I have spent a total of 42 minutes of critical care time today caring for this patient & reviewing the patient's electronic medical record.  Donna Christen Jamison Neighbor, M.D. [facility] Pulmonary & Critical Care Pager:  [phone] After 3pm or if no response, call [phone]  09/12/2015, 8:55 AM

## 2015-09-13 ENCOUNTER — Other Ambulatory Visit (HOSPITAL_COMMUNITY): Payer: Self-pay | Admitting: *Deleted

## 2015-09-13 DIAGNOSIS — I5021 Acute systolic (congestive) heart failure: Secondary | ICD-10-CM

## 2015-09-13 DIAGNOSIS — K2961 Other gastritis with bleeding: Secondary | ICD-10-CM

## 2015-09-13 LAB — GLUCOSE, CAPILLARY
GLUCOSE-CAPILLARY: 128 mg/dL — AB (ref 65–99)
GLUCOSE-CAPILLARY: 175 mg/dL — AB (ref 65–99)
Glucose-Capillary: 133 mg/dL — ABNORMAL HIGH (ref 65–99)
Glucose-Capillary: 151 mg/dL — ABNORMAL HIGH (ref 65–99)
Glucose-Capillary: 116 mg/dL — ABNORMAL HIGH (ref 65–99)
Glucose-Capillary: 146 mg/dL — ABNORMAL HIGH (ref 65–99)

## 2015-09-13 LAB — RENAL FUNCTION PANEL
ALBUMIN: 2.5 g/dL — AB (ref 3.5–5.0)
ANION GAP: 14 (ref 5–15)
BUN: 149 mg/dL — ABNORMAL HIGH (ref 6–20)
CALCIUM: 8.4 mg/dL — AB (ref 8.9–10.3)
CHLORIDE: 104 mmol/L (ref 101–111)
CO2: 23 mmol/L (ref 22–32)
Creatinine, Ser: 5.69 mg/dL — ABNORMAL HIGH (ref 0.61–1.24)
GFR calc non Af Amer: 8 mL/min — ABNORMAL LOW (ref >60)
GFR, EST AFRICAN AMERICAN: 10 mL/min — AB (ref >60)
GLUCOSE: 145 mg/dL — AB (ref 65–99)
Phosphorus: 7.5 mg/dL — ABNORMAL HIGH (ref 2.5–4.6)
Potassium: 4.3 mmol/L (ref 3.5–5.1)
SODIUM: 141 mmol/L (ref 135–145)

## 2015-09-13 LAB — CBC
HCT: 25.5 % — ABNORMAL LOW (ref 39.0–52.0)
HEMATOCRIT: 25.2 % — AB (ref 39.0–52.0)
HEMATOCRIT: 20.5 % — AB (ref 39.0–52.0)
HEMOGLOBIN: 8.4 g/dL — AB (ref 13.0–17.0)
Hemoglobin: 6.7 g/dL — CL (ref 13.0–17.0)
Hemoglobin: 8.6 g/dL — ABNORMAL LOW (ref 13.0–17.0)
MCH: 30.5 pg (ref 26.0–34.0)
MCH: 31.4 pg (ref 26.0–34.0)
MCH: 30.3 pg (ref 26.0–34.0)
MCHC: 34.1 g/dL (ref 30.0–36.0)
MCHC: 32.7 g/dL (ref 30.0–36.0)
MCHC: 32.9 g/dL (ref 30.0–36.0)
MCV: 93.2 fL (ref 78.0–100.0)
MCV: 92 fL (ref 78.0–100.0)
MCV: 92.1 fL (ref 78.0–100.0)
PLATELETS: 164 10*3/uL (ref 150–400)
PLATELETS: 172 10*3/uL (ref 150–400)
PLATELETS: 152 10*3/uL (ref 150–400)
RBC: 2.2 MIL/uL — ABNORMAL LOW (ref 4.22–5.81)
RBC: 2.74 MIL/uL — ABNORMAL LOW (ref 4.22–5.81)
RBC: 2.77 MIL/uL — AB (ref 4.22–5.81)
RDW: 17.5 % — AB (ref 11.5–15.5)
RDW: 17.8 % — AB (ref 11.5–15.5)
RDW: 17.3 % — ABNORMAL HIGH (ref 11.5–15.5)
WBC: 15.4 10*3/uL — ABNORMAL HIGH (ref 4.0–10.5)
WBC: 16.2 10*3/uL — AB (ref 4.0–10.5)
WBC: 18.4 10*3/uL — AB (ref 4.0–10.5)

## 2015-09-13 LAB — CBC WITH DIFFERENTIAL/PLATELET
BASOS ABS: 0 10*3/uL (ref 0.0–0.1)
Basophils Relative: 0 %
Eosinophils Absolute: 0 10*3/uL (ref 0.0–0.7)
Eosinophils Relative: 0 %
HEMATOCRIT: 24 % — AB (ref 39.0–52.0)
HEMOGLOBIN: 8.1 g/dL — AB (ref 13.0–17.0)
LYMPHS PCT: 3 %
Lymphs Abs: 0.5 10*3/uL — ABNORMAL LOW (ref 0.7–4.0)
MCH: 31.3 pg (ref 26.0–34.0)
MCHC: 33.8 g/dL (ref 30.0–36.0)
MCV: 92.7 fL (ref 78.0–100.0)
Monocytes Absolute: 0.9 10*3/uL (ref 0.1–1.0)
Monocytes Relative: 6 %
NEUTROS ABS: 14.9 10*3/uL — AB (ref 1.7–7.7)
NEUTROS PCT: 91 %
PLATELETS: 154 10*3/uL (ref 150–400)
RBC: 2.59 MIL/uL — AB (ref 4.22–5.81)
RDW: 17.3 % — ABNORMAL HIGH (ref 11.5–15.5)
WBC: 16.3 10*3/uL — AB (ref 4.0–10.5)

## 2015-09-13 LAB — LEGIONELLA PNEUMOPHILA SEROGP 1 UR AG: L. pneumophila Serogp 1 Ur Ag: NEGATIVE

## 2015-09-13 LAB — HEPARIN LEVEL (UNFRACTIONATED)
HEPARIN UNFRACTIONATED: 0.5 [IU]/mL (ref 0.30–0.70)
Heparin Unfractionated: 0.29 IU/mL — ABNORMAL LOW (ref 0.30–0.70)

## 2015-09-13 LAB — TROPONIN I: TROPONIN I: 11.65 ng/mL — AB (ref <0.031)

## 2015-09-13 LAB — PREPARE RBC (CROSSMATCH)

## 2015-09-13 LAB — PROCALCITONIN: Procalcitonin: 8.84 ng/mL

## 2015-09-13 LAB — LACTIC ACID, PLASMA: Lactic Acid, Venous: 0.9 mmol/L (ref 0.5–2.0)

## 2015-09-13 LAB — MAGNESIUM: MAGNESIUM: 2.8 mg/dL — AB (ref 1.7–2.4)

## 2015-09-13 MED ORDER — FUROSEMIDE 10 MG/ML IJ SOLN
160.0000 mg | Freq: Four times a day (QID) | INTRAMUSCULAR | Status: DC
  Administered 2015-09-13 – 2015-09-14 (×4): 160 mg via INTRAVENOUS
  Filled 2015-09-13 (×7): qty 16

## 2015-09-13 MED ORDER — METOLAZONE 5 MG PO TABS
10.0000 mg | ORAL_TABLET | Freq: Every day | ORAL | Status: DC
  Administered 2015-09-16: 10 mg via ORAL
  Filled 2015-09-13: qty 2

## 2015-09-13 MED ORDER — SODIUM CHLORIDE 0.9 % IV SOLN
Freq: Once | INTRAVENOUS | Status: AC
  Administered 2015-09-13: 03:00:00 via INTRAVENOUS

## 2015-09-13 MED ORDER — METOPROLOL TARTRATE 1 MG/ML IV SOLN
10.0000 mg | Freq: Four times a day (QID) | INTRAVENOUS | Status: DC
  Administered 2015-09-13 – 2015-09-14 (×3): 10 mg via INTRAVENOUS
  Filled 2015-09-13 (×3): qty 10

## 2015-09-13 MED ORDER — METOLAZONE 5 MG PO TABS: 10.0000 mg | ORAL_TABLET | Freq: Three times a day (TID) | ORAL | Status: DC

## 2015-09-13 NOTE — Progress Notes (Signed)
PULMONARY / CRITICAL CARE MEDICINE   Name: [name] MRN: [id number] DOB: 11/29/1929    ADMISSION DATE:  09/11/2015 CONSULTATION DATE:  11/13  REFERRING MD :  Butler Denmarkizwan   CHIEF COMPLAINT:  Acute Respiratory Failure  INITIAL PRESENTATION: 81 y/o male w/ stage IV CKD , HTN and CAD. Presents to the ED on 11/13 w/ acute resp failure in setting of acute pulmonary edema, hypertensive crisis and NSTEMI +/- CAP CCM asked to assess given degree of respiratory failure and ongoing ischemia w/ concern the pt would require intubation.   STUDIES:  ECHO 11/13 - Systolic function wasvigorous. The estimated ejection fraction was in the range of 65% to 70%. Doppler parameters are consistent with abnormal leftventricular relaxation (grade 1 diastolic dysfunction). - Left atrium: The atrium was mildly dilated. - Pulmonary arteries: PA peak pressure: 65 mm Hg (S). Port CXR 11/13 - ETT & CVC in good position. Bilateral lower lung opacities.  SIGNIFICANT EVENTS: 11/13 - Admit  11/13 - Intubation  11/15 - Self-extubation  HISTORY OF PRESENT ILLNESS:   81 year old male w/sig h/o hypertension, diabetes, symptomatic bradycardia, peripheral vascular disease, stage IV chronic kidney disease, anemia of chronic kidney disease on Procrit, and dyslipidemia. [facility]: 2 days of shortness of breath.  He's not had any cough or chest pain he's had subjective chills but no fevers. He reported some increase in lower extremity edema. Because of significant shortness of breath he presented to the ER today the EMS. Upon EMS arrival to the home patient had increased work of breathing with extremely labored effort and use of accessory muscles. On exam he was found to have bilateral coarse crackles. Pulse oximetry on room air was 80%. Patient received 2 DuoNeb's without any improvement in symptoms. BiPAP was initiated by EMS. In ED CXR showed R>L airspace disease. Admitted to the IM service w/  working dx of acute resp failure in setting of pulmonary edema in setting of NSTEMI +/- PNA. PCCM asked to see re: need for intubation given AMI.   SUBJECTIVE: Patient was transfused 1 unit of packed red blood cells overnight without any obvious source of bleeding. Patient self extubated earlier this morning. Reports no chest pain or pressure at this time. Reports no difficulty breathing when examined on CPAP.  REVIEW OF SYSTEMS:  Denies any nausea or vomiting. Denies any headache. Difficult to assess given patient is currently on CPAP for oxygen support.  VITAL SIGNS: Temp:  [98.1 F (36.7 C)-99.1 F (37.3 C)] 98.3 F (36.8 C) (11/15 1600) Pulse Rate:  [80-97] 97 (11/15 1121) Resp:  [9-22] 17 (11/15 1700) BP: (116-154)/(37-107) 149/53 mmHg (11/15 1700) SpO2:  [87 %-98 %] 92 % (11/15 1700) FiO2 (%):  [40 %-100 %] 50 % (11/15 1600) Weight:  [160 lb (72.576 kg)] 160 lb (72.576 kg) (11/15 0429) HEMODYNAMICS: CVP:  [9 mmHg-18 mmHg] 13 mmHg VENTILATOR SETTINGS: Vent Mode:  [-] CPAP FiO2 (%):  [40 %-100 %] 50 % Set Rate:  [14 bmp] 14 bmp Vt Set:  [510 mL] 510 mL PEEP:  [5 cmH20] 5 cmH20 Plateau Pressure:  [15 cmH20] 15 cmH20 INTAKE / OUTPUT:  Intake/Output Summary (Last 24 hours) at 09/13/15 1736 Last data filed at 09/13/15 1706  Gross per 24 hour  Intake 1062.41 ml  Output    475 ml  Net 587.41 ml    PHYSICAL EXAMINATION: General:  Awake. No distress. Currently on CPAP with daughter at bedside. Integument:  Warm & dry. No rash  on exposed skin.  HEENT: Full facemask in place. No scleral icterus or injection. Cardiovascular:  Regular rate. No appreciable JVD. Normal sinus rhythm on telemetry. Pulmonary:  Normal work of breathing on CPAP. No accessory muscle use. Mild crackles bilateral lung bases. Abdomen: Soft. Normal bowel sounds. Nondistended.  Neurological: Following commands and giving thumbs up appropriately. Moving all 4 extremities equally and grossly  nonfocal.  LABS:  CBC  Recent Labs Lab 09/13/15 0045 09/13/15 0625 09/13/15 1500  WBC 15.4* 16.3* 18.4*  HGB 6.7* 8.1* 8.6*  HCT 20.5* 24.0* 25.2*  PLT 152 154 172   Coag's  Recent Labs Lab 09/11/15 0955 09/11/15 1450  APTT  --  31  INR 1.25 1.35   BMET  Recent Labs Lab 09/11/15 1450 09/12/15 0235 09/13/15 0625  NA 140 144 141  K 2.7* 3.7 4.3  CL 101 105 104  CO2 BUN 106* 114* 149*  CREATININE 3.84* 4.38* 5.69*  GLUCOSE 369* 130* 145*   Electrolytes  Recent Labs Lab 09/11/15 0955 09/11/15 1450 09/12/15 0235 09/12/15 0940 09/13/15 0625  CALCIUM 8.9 8.8* 8.6*  --  8.4*  MG 2.6*  --   --  2.7* 2.8*  PHOS  --   --   --   --  7.5*   Sepsis Markers  Recent Labs Lab 09/11/15 1706 09/11/15 2020 09/12/15 0235 09/12/15 0945 09/13/15 0625  LATICACIDVEN  --  2.3*  --  1.1 0.9  PROCALCITON 5.48  --  8.57  --  8.84   ABG  Recent Labs Lab 09/11/15 1242 09/11/15 1712  PHART 7.408 7.319*  PCO2ART 31.0* 57.1*  PO2ART 84.0 302.0*   Liver Enzymes  Recent Labs Lab 09/11/15 0955 09/11/15 1450 09/13/15 0625  AST 73* 71*  --   ALT 22 23  --   ALKPHOS 82 84  --   BILITOT 1.3* 1.0  --   ALBUMIN 3.1* 3.0* 2.5*   Cardiac Enzymes  Recent Labs Lab 09/11/15 2020 09/12/15 0235 09/13/15 1115  TROPONINI 11.37* 18.51* 11.65*   Glucose  Recent Labs Lab 09/12/15 1942 09/13/15 0045 09/13/15 0417 09/13/15 0817 09/13/15 1120 09/13/15 1505  GLUCAP 148* 133* 151* 128* 175* 116*    Imaging No results found.   ASSESSMENT / PLAN:  PULMONARY OETT 11/13 - 11/15 (self extubation) A: Acute Hypoxic respiratory Failure - Improving. Pulmonary edema vs Severe CAP Pulmonary Edema - Decompensated Diastolic CHF Possible Severe CAP - PCT elevated & Ctx pending  P:   Continue CPAP intermittently for recruitment & to maintain saturation Weaning FiO2 to 0.5 for Sat >94% Duoneb q6hr See ID section  See Cardiovascular section for  diuresis  CARDIOVASCULAR A:  NSTEMI vs Demand Ischemia - H/O CAD.  Acute Decompensation of Diastolic CHF Hypertensive Crisis - Resolved.  H/O PVD & Carotid Artery Disease  P:  Heparin drip per pharmacy protocol Transfuse for goal hgb > 8 CVP from CVL Monitor on telemetry Intermittent Lasix per Cardiology Lopressor  IV q6hr Hydralazine  tid Plavix daily ASA  qhs Lipitor  qhs Clonidine 0.1mg  tid  RENAL A:   Acute on Chronic Renal Failure - Worsening & oliguric. Lactic acidosis - Resolved. Hypokalemia - Resolved.  P:   Nephrology consulted & patient refused HD Maximize Cardiac Output with Diuresis Strict I&O Monitoring UOP with Foley Trending renal function with daily BUN/Creatinine Monitoring electrolytes daily Continue Terazosin  GASTROINTESTINAL A:   Heme + stools - No obvious GI blood loss.  P:   NPO for  now GI consulted but patient too unstable Protonix  IV q12hr  HEMATOLOGIC A:   Anemia of chronic disease - Hgb improving. S/P transfusion 1u PRBC 11/13 & 1u PRBC on 11/14. Heme + stools - No hematochezia or melena. Leukocytosis - Stable. Multifactorial in etiology.  P:  Trending Hgb w/ CBC q8hr Trending Leukcytosis w/ CBC Systemic anticoagulation w/ heparin gtt  INFECTIOUS A:   Possible Sepsis Possible Severe CAP  P:   Procalcitonin Algorithm  Ctx: Trach Asp 11/14>>> Blood x2 11/13>>> Urine Strep 11/13 - Negative Urine Legionella 11/13 - Negative Influenza PCR 11/14 - Negative  Abx: Azithromycin 11/13>>> Rocephin 11/13>>>  ENDOCRINE A:   H/O DM - BG controlled  P:   Accu-Checks q4hr Low dose SSI Protocol  NEUROLOGIC A:   No acute issues  P:   D/C Fentanyl  FAMILY  - Updates:  Daughter at bedside to update this morning. - Inter-disciplinary family meet or Palliative Care meeting due by:  11/20  TODAY'S SUMMARY: 81 year old male with known history of coronary artery disease & peripheral vascular disease.  Presenting with symptomatic anemia & hypertensive crisis. Patient possibly has pneumonia given his elevated Procalcitonin. Continuing antibiotic coverage with Rocephin & azithromycin. Patient started on heparin drip for non-ST elevation MI. Continuing to monitor hemoglobin closely. If patient's renal function continues to worsen his prognosis is dismal.  I have spent a total of 36  minutes of critical care time today caring for this patient, updating the patient's daughter at bedside, discussing the plan of care with cardiology, & reviewing the patient's electronic medical record.  Donna Christen Jamison Neighbor, M.D. [facility] Pulmonary & Critical Care Pager:  [phone] After 3pm or if no response, call [phone]  09/13/2015, 5:36 PM

## 2015-09-13 NOTE — Consult Note (Signed)
[name] is a 81 y.o. male referred by Cardiology due to AKI.   CC: Presented with dyspnea  HPI: 81 year old male with a history of hypertension, hypercholesterolemia, CKD stage 4, diabetes, asthma, peripheral vascular disease, mild coronary artery disease who was admitted with acute hypoxic respiratory failure requiring intubation, found to have elevated lactic acid, leukocytosis possibly due to pneumonia and severe anemia suspicious for GIB (stool occult positive). He was also in acute diastolic CHF and elevated troponin that qualified for NSTEMI.   Nephrology was consulted due to patient's worsening renal function. Patient states that he follows with Dr. Darrick Penna as an outpatient. He states that he knows he has kidney disease. Prior to admission patient states he was making good urine. Patient states that he does not want hemodialysis.    Past Medical History  Diagnosis Date  . Diabetes mellitus   . Hypertension   . Hypercholesteremia   . Gout   . PVD (peripheral vascular disease) (HCC) 7/05    LCE, known 60% RICA 7/12  . CAD (coronary artery disease) 2002    non critical  . Macular degeneration   . Chronic renal disease, stage III   . Claudication (HCC)   . Palpitations     PVCs and PSVT on Holter monitoring  . Bilateral carotid artery disease (HCC)     status post left carotid endarterectomy performed by Dr. Madilyn Fireman 05/12/04  . Anemia    Past Surgical History  Procedure Laterality Date  . 2d echocardiogram  03/31/2008    EF greater than 55%  . Cardiovascular stress test  06/30/2010    Nonischemic. Low risk  . Cerebral angiogram  04/03/2004    High-grade 80% ostial L ICA stenosis. Medical management.  . Abdominal aortogram  09/01/2007    Widely patent renal arteries. Medical management.  . Peripheral vascular angiogram  05/07/2011    No evidence of intracranial occlusions, stenosis, dissections, or aneurysms seen  . Carotid doppler  06/09/2012    R Vertebral-known occluded  vessel, R Bulb/Proximal ICA-moderate to severe amount of plaque w/50-69% diameter reduction, L Subclavian-50-69% diameter reduction, L CEA-demonstrated increased velocities w/o evidence of hemodynamically significant stenosis  . Cardiac catheterization  10/17/2001    No significant CAD, normal LV systolic function.  . Carotid endarterectomy Left July 2005  . Lower extremity angiogram Bilateral 11/01/2014    Procedure: LOWER EXTREMITY ANGIOGRAM;  Surgeon: [name], MD;  Location: [facility] CATH LAB;  Service: Cardiovascular;  Laterality: Bilateral;  . Abdominal angiogram  11/01/2014    Procedure: ABDOMINAL ANGIOGRAM;  Surgeon: [name], MD;  Location: [facility] CATH LAB;  Service: Cardiovascular;;  . Knee surgery    . Hernia repair     Family History  Problem Relation Age of Onset  . Stroke Sister   . Heart disease Brother   . Diabetes Neg Hx    Allergies  Allergen Reactions  . Labetalol Swelling    angioedema   Medications Prior to Admission  Medication Sig Dispense Refill  . ACCU-CHEK FASTCLIX LANCETS MISC Use to check blood sugar 2 times per day dx code E11.9 102 each 2  . allopurinol (ZYLOPRIM) 100 MG tablet TAKE 1 TABLET (100 MG TOTAL) BY MOUTH AT BEDTIME. 30 tablet 2  . amLODipine (NORVASC) 10 MG tablet Take 1 tablet by mouth every morning.     Marland Kitchen aspirin EC 81 MG tablet Take 81 mg by mouth every evening.    Marland Kitchen atorvastatin (LIPITOR) 40 MG tablet Take 1 tablet by  mouth daily.  5  . calcitRIOL (ROCALTROL) 0.25 MCG capsule Take 0.25 mcg by mouth every morning.     . cholecalciferol (VITAMIN D) 1000 UNITS tablet Take 1,000 Units by mouth 2 (two) times daily.     . cloNIDine (CATAPRES) 0.1 MG tablet Take 1 tablet by mouth 3 (three) times daily.  11  . clopidogrel (PLAVIX) 75 MG tablet TAKE 1 TABLET BY MOUTH DAILY 30 tablet 10  . feeding supplement, RESOURCE BREEZE, (RESOURCE BREEZE) LIQD Take 1 Container by mouth 3 (three) times daily between meals. 30 Container 0  . glucose blood  (ACCU-CHEK AVIVA PLUS) test strip Use as instructed to check blood sugar 2 times per day dx code E11.9 100 each 3  . hydrALAZINE (APRESOLINE) 25 MG tablet Take 3 tablets (75 mg total) by mouth every 8 (eight) hours. 60 tablet 0  . Potassium Chloride ER 20 MEQ TBCR Take 1 tablet by mouth daily.  6  . sitaGLIPtin (JANUVIA) 25 MG tablet Take 1 tablet (25 mg total) by mouth daily. 30 tablet 2  . terazosin (HYTRIN) 10 MG capsule TAKE 1 CAPSULE (10 MG TOTAL) BY MOUTH AT BEDTIME. 90 capsule 0  . hydrALAZINE (APRESOLINE) 50 MG tablet Take 50 mg by mouth 3 (three) times daily.  6  . Multiple Vitamins-Minerals (PRESERVISION AREDS PO) Take 2 tablets by mouth daily.    . pantoprazole (PROTONIX) 40 MG tablet Take 1 tablet (40 mg total) by mouth daily. 30 tablet 0  . polyethylene glycol (MIRALAX / GLYCOLAX) packet Take 17 g by mouth 2 (two) times daily. 14 each 0    Lab Results: Results for orders placed or performed during the hospital encounter of 09/11/15 (from the past 24 hour(s))  Influenza panel by PCR (type A & B, H1N1)     Status: None   Collection Time: 09/12/15 11:37 AM  Result Value Ref Range   Influenza A By PCR NEGATIVE NEGATIVE   Influenza B By PCR NEGATIVE NEGATIVE   H1N1 flu by pcr NOT DETECTED NOT DETECTED  Glucose, capillary     Status: Abnormal   Collection Time: 09/12/15 12:31 PM  Result Value Ref Range   Glucose-Capillary 214 (H) 65 - 99 mg/dL  Strep pneumoniae urinary antigen     Status: None   Collection Time: 09/12/15  1:22 PM  Result Value Ref Range   Strep Pneumo Urinary Antigen NEGATIVE NEGATIVE  Glucose, capillary     Status: Abnormal   Collection Time: 09/12/15  4:11 PM  Result Value Ref Range   Glucose-Capillary 175 (H) 65 - 99 mg/dL   Comment 1 Document in Chart   CBC     Status: Abnormal   Collection Time: 09/12/15  4:35 PM  Result Value Ref Range   WBC 19.6 (H) 4.0 - 10.5 K/uL   RBC 2.42 (L) 4.22 - 5.81 MIL/uL   Hemoglobin 7.5 (L) 13.0 - 17.0 g/dL   HCT 16.1  (L) 09.6 - 52.0 %   MCV 92.6 78.0 - 100.0 fL   MCH 31.0 26.0 - 34.0 pg   MCHC 33.5 30.0 - 36.0 g/dL   RDW 04.5 (H) 40.9 - 81.1 %   Platelets 180 150 - 400 K/uL  Glucose, capillary     Status: Abnormal   Collection Time: 09/12/15  7:42 PM  Result Value Ref Range   Glucose-Capillary 148 (H) 65 - 99 mg/dL   Comment 1 Capillary Specimen   Heparin level (unfractionated)     Status: Abnormal  Collection Time: 09/12/15  8:11 PM  Result Value Ref Range   Heparin Unfractionated 0.12 (L) 0.30 - 0.70 IU/mL  CBC     Status: Abnormal   Collection Time: 09/13/15 12:45 AM  Result Value Ref Range   WBC 15.4 (H) 4.0 - 10.5 K/uL   RBC 2.20 (L) 4.22 - 5.81 MIL/uL   Hemoglobin 6.7 (LL) 13.0 - 17.0 g/dL   HCT 40.9 (L) 81.1 - 91.4 %   MCV 93.2 78.0 - 100.0 fL   MCH 30.5 26.0 - 34.0 pg   MCHC 32.7 30.0 - 36.0 g/dL   RDW 78.2 (H) 95.6 - 21.3 %   Platelets 152 150 - 400 K/uL  Glucose, capillary     Status: Abnormal   Collection Time: 09/13/15 12:45 AM  Result Value Ref Range   Glucose-Capillary 133 (H) 65 - 99 mg/dL   Comment 1 Capillary Specimen   Prepare RBC     Status: None   Collection Time: 09/13/15  1:13 AM  Result Value Ref Range   Order Confirmation ORDER PROCESSED BY BLOOD BANK   Glucose, capillary     Status: Abnormal   Collection Time: 09/13/15  4:17 AM  Result Value Ref Range   Glucose-Capillary 151 (H) 65 - 99 mg/dL   Comment 1 Capillary Specimen   Procalcitonin     Status: None   Collection Time: 09/13/15  6:25 AM  Result Value Ref Range   Procalcitonin 8.84 ng/mL  Magnesium     Status: Abnormal   Collection Time: 09/13/15  6:25 AM  Result Value Ref Range   Magnesium 2.8 (H) 1.7 - 2.4 mg/dL  CBC with Differential/Platelet     Status: Abnormal   Collection Time: 09/13/15  6:25 AM  Result Value Ref Range   WBC 16.3 (H) 4.0 - 10.5 K/uL   RBC 2.59 (L) 4.22 - 5.81 MIL/uL   Hemoglobin 8.1 (L) 13.0 - 17.0 g/dL   HCT 08.6 (L) 57.8 - 46.9 %   MCV 92.7 78.0 - 100.0 fL   MCH 31.3  26.0 - 34.0 pg   MCHC 33.8 30.0 - 36.0 g/dL   RDW 62.9 (H) 52.8 - 41.3 %   Platelets 154 150 - 400 K/uL   Neutrophils Relative % 91 %   Neutro Abs 14.9 (H) 1.7 - 7.7 K/uL   Lymphocytes Relative 3 %   Lymphs Abs 0.5 (L) 0.7 - 4.0 K/uL   Monocytes Relative 6 %   Monocytes Absolute 0.9 0.1 - 1.0 K/uL   Eosinophils Relative 0 %   Eosinophils Absolute 0.0 0.0 - 0.7 K/uL   Basophils Relative 0 %   Basophils Absolute 0.0 0.0 - 0.1 K/uL  Lactic acid, plasma     Status: None   Collection Time: 09/13/15  6:25 AM  Result Value Ref Range   Lactic Acid, Venous 0.9 0.5 - 2.0 mmol/L  Heparin level (unfractionated)     Status: Abnormal   Collection Time: 09/13/15  6:25 AM  Result Value Ref Range   Heparin Unfractionated 0.29 (L) 0.30 - 0.70 IU/mL  Renal function panel     Status: Abnormal   Collection Time: 09/13/15  6:25 AM  Result Value Ref Range   Sodium 141 135 - 145 mmol/L   Potassium 4.3 3.5 - 5.1 mmol/L   Chloride 104 101 - 111 mmol/L   CO2 23 22 - 32 mmol/L   Glucose, Bld 145 (H) 65 - 99 mg/dL   BUN 244 (H) 6 - 20 mg/dL  Creatinine, Ser 5.69 (H) 0.61 - 1.24 mg/dL   Calcium 8.4 (L) 8.9 - 10.3 mg/dL   Phosphorus 7.5 (H) 2.5 - 4.6 mg/dL   Albumin 2.5 (L) 3.5 - 5.0 g/dL   GFR calc non Af Amer 8 (L) >60 mL/min   GFR calc Af Amer 10 (L) >60 mL/min   Anion gap 14 5 - 15  Glucose, capillary     Status: Abnormal   Collection Time: 09/13/15  8:17 AM  Result Value Ref Range   Glucose-Capillary 128 (H) 65 - 99 mg/dL   Comment 1 Venous Specimen    Urinalysis    Component Value Date/Time   COLORURINE YELLOW 09/11/2015 1330   APPEARANCEUR CLOUDY* 09/11/2015 1330   LABSPEC 1.015 09/11/2015 1330   PHURINE 5.0 09/11/2015 1330   GLUCOSEU NEGATIVE 09/11/2015 1330   HGBUR NEGATIVE 09/11/2015 1330   BILIRUBINUR NEGATIVE 09/11/2015 1330   KETONESUR NEGATIVE 09/11/2015 1330   PROTEINUR 100* 09/11/2015 1330   UROBILINOGEN 0.2 09/11/2015 1330   NITRITE NEGATIVE 09/11/2015 1330   LEUKOCYTESUR  NEGATIVE 09/11/2015 1330    Recent Labs  09/12/15 1635 09/13/15 0045 09/13/15 0625  WBC 19.6* 15.4* 16.3*  HGB 7.5* 6.7* 8.1*  HCT 22.4* 20.5* 24.0*  PLT 180 152 154   BMET  Recent Labs  09/11/15 1450 09/12/15 0235 09/13/15 0625  NA 140 144 141  K 2.7* 3.7 4.3  CL 101 105 104  CO2 22 28 23   GLUCOSE 369* 130* 145*  BUN 106* 114* 149*  CREATININE 3.84* 4.38* 5.69*  CALCIUM 8.8* 8.6* 8.4*  PHOS  --   --  7.5*   LFT  Recent Labs  09/11/15 1450 09/13/15 0625  PROT 6.4*  --   ALBUMIN 3.0* 2.5*  AST 71*  --   ALT 23  --   ALKPHOS 84  --   BILITOT 1.0  --     Dg Chest Port 1 View  09/12/2015  CLINICAL DATA:  RN states she is giving lasix and checking for fluid overload, pt has ETT, OGT, Rt subclavian line, hx htn, diabetes. Just tested positive for the flu and put on droplet precaution. EXAM: PORTABLE CHEST 1 VIEW COMPARISON:  09/11/2015 FINDINGS: Bilateral airspace opacities are less confluent than on the previous day's study. There are no new lung opacities. Cardiac silhouette is top-normal in size. No mediastinal or hilar masses or evidence of adenopathy. No pneumothorax. Endotracheal tube tip projects 3.1 cm above the carina. Orogastric tube passes below the diaphragm into the stomach. This is new from the earlier study. Right subclavian central venous line tip projects just above the caval atrial junction, well positioned. IMPRESSION: 1. Improved lung aeration although there is still significant bilateral airspace opacities, consistent with pulmonary edema. 2. No new abnormalities. 3. Support apparatus is well positioned. Electronically Signed   By: Amie Portlandavid  Ormond M.D.   On: 09/12/2015 10:21   Dg Chest Port 1 View  09/11/2015  CLINICAL DATA:  Pt arrives from home via [facility]. Pt states he began having SOB last night and wasn't able to lay down and still breathe. Pt is extremely labored upon arrival and is using accessory muscles. Pt has bilateral coarse crackles. Pt states  he is in "borderline kidney failure" and quit smoking 30 years ago EXAM: PORTABLE CHEST 1 VIEW COMPARISON:  03/21/2015 FINDINGS: Bilateral airspace opacity has developed in the mid to lower lungs, right greater than left. The upper lungs are clear. Cardiac silhouette is normal in size. No mediastinal or hilar  masses. No definite pleural effusion and no pneumothorax. Bony thorax is demineralized but grossly intact. IMPRESSION: Bilateral mid to lower lung zone airspace opacities, right greater than left. This may reflect multifocal pneumonia. Pulmonary edema is possible, with pneumonia favored. Electronically Signed   By: Amie Portland M.D.   On: 09/11/2015 10:07    ROS: See above HPI, also denies chest pain, shortness of breath, dysuria, headache, fever.   PHYSICAL EXAM: Blood pressure 129/69, pulse 96, temperature 99.1 F (37.3 C), temperature source Oral, resp. rate 10, height  (1.676 m), weight 160 lb (72.576 kg), SpO2 94 %. GEN: lying in bed, NAD HEENT: normal NECK: supple LUNGS: anterior lung fields clear, CPAP in place CARDIAC: RRR without murmur ABD: soft, non-tender, +BS, distended somewhat EXT: no edema, pulses intact NEURO: non-focal Skin: bruising, warm, and dry, intact  Assessment/Plan: SAMARTH OGLE is a 81 y.o. man with history of DM2, HTN, HLD, PVD, CAD, CKD stage 4, carotid artery disease, chronic anemia who presented with dyspnea.   1. AKI in CKD4: Nephrologist is Dr. Darrick Penna. Baseline creatinine around 4. On admission Cr was at baseline but has continued to worsen. He is now oliguric. He has been given Lasix and metolazone without much UOP. Believe this is ATN with progression of renal disease. He only made 300 cc of urine despite total of 240 mg of Lasix and metolazone yesterday. Electrolytes are stable and no signs of uremia.  -Scr today is 5.69. Will likely continue to worsen -monitor I/Os and daily weights -trend labs -will diuresis with higher doses of Lasix and  metolazone (Lasix 160 q6hrs and Metolazone  q8hrs); not hopeful this will work but with not wanting HD best option for fluid overload -avoid nephrotoxic agents and contrast  -patient not amendable to HD at this time; no emergent need for HD. Will need to continue to have discussions with patient -palliative consult to helps with goals of care since declining HD.  2. Dyspnea: patient was admitted to ICU with acute hypoxic respiratory failure due to pulmonary edema vs CAP. He has decompensated diastolic CHF. Lactic acidosis on admission to 6.69 which has resolved. He is now anuric with significant fluid overload on imaging(pulmonary edema). Patient now extubated (self-extubation) and on CPAP. -Pulmonology following -on rocephin and azithromycin   3. NSTEMI: Troponins continued to rise on admission qualifying for NSTEMI.  -cardiology following -holding off on cath in setting of possible HD -On heparin  4. Aneima: of chronic disease along with acute blood loss anemia from GI bleed(positive stool occult). S/p 1 u PRBCs.  -on PPI -GI following; unstable for procedure   Caryl Ada, DO 09/13/2015, 11:18 AM PGY-2, [facility]

## 2015-09-13 NOTE — Progress Notes (Signed)
RT called by RN. Patient self extubated. Patient placed on NRB sat 96%. Patient in no distress right now. Rt will continue to monitor.

## 2015-09-13 NOTE — Progress Notes (Signed)
ANTICOAGULATION CONSULT NOTE - Follow Up Consult  Pharmacy Consult for Heparin Indication: NSTEMI  Allergies  Allergen Reactions  . Labetalol Swelling    angioedema    Patient Measurements: Height:  (167.6 cm) Weight: 160 lb (72.576 kg) IBW/kg (Calculated) : 63.8   Vital Signs: Temp: 98.1 F (36.7 C) (11/15 0506) Temp Source: Oral (11/15 0506) BP: 134/48 mmHg (11/15 0600) Pulse Rate: 96 (11/15 0319)  Labs:  Recent Labs  09/11/15 0955  09/11/15 1450 09/11/15 2020 09/12/15 0235  09/12/15 1635 09/12/15 2011 09/13/15 0045 09/13/15 0625  HGB 7.5*  < > 8.3*  --   --   < > 7.5*  --  6.7* 8.1*  HCT 22.6*  < > 25.0*  --   --   < > 22.4*  --  20.5* 24.0*  PLT 216  --  195  --   --   < > 180  --  152 154  APTT  --   --  31  --   --   --   --   --   --   --   LABPROT 15.8*  --  16.8*  --   --   --   --   --   --   --   INR 1.25  --  1.35  --   --   --   --   --   --   --   HEPARINUNFRC  --   --   --   --   --   --   --  0.12*  --  0.29*  CREATININE 3.82*  < > 3.84*  --  4.38*  --   --   --   --  5.69*  TROPONINI  --   --  6.15* 11.37* 18.51*  --   --   --   --   --   < > = values in this interval not displayed.  Estimated Creatinine Clearance: 9 mL/min (by C-G formula based on Cr of 5.69).   Medications:  Scheduled:  . antiseptic oral rinse  7 mL Mouth Rinse Q2H  . atorvastatin  80 mg Per Tube q1800  . azithromycin  500 mg Intravenous Q24H  . cefTRIAXone (ROCEPHIN)  IV  1 g Intravenous Q24H  . chlorhexidine gluconate  15 mL Mouth Rinse BID  . cloNIDine  0.1 mg Per Tube TID  . clopidogrel  75 mg Per Tube Daily  . hydrALAZINE  50 mg Per Tube TID  . insulin aspart  0-9 Units Subcutaneous Q4H  . ipratropium-albuterol  3 mL Nebulization Q6H  . metoprolol  5 mg Intravenous 4 times per day  . pantoprazole (PROTONIX) IV  40 mg Intravenous Q12H  . terazosin  10 mg Per Tube QHS    Assessment: 84 yom with uptrending troponins and likely diagnosis of NSTEMI vs demand  ischemia. Patient had no urgent indication for cath but heparin drip started. Patient does havesuspected GI bleed (heme + stools), continue PPI per GI. RN reports no gross bleeding noted overnight. Patient transfused after Hgb down to 6.7 overnight. Hgb 8.1 this AM.  Heparin level slightly subtherapeutic (0.29) on 1000 units/hr. Will aim for lower end of goal with possible GIB (~0.3)  Goal of Therapy:  Heparin level 0.3-0.7 units/ml Monitor platelets by anticoagulation protocol: Yes   Plan:  - Increase heparin infusion to 1100 units/hr - Check 8-hr heparin level - Monitor CBC every 8 hours - Monitor for s/sx of  bleeding   Scarlette ArAshley Hall 09/13/2015,7:41 AM  Sheppard CoilFrank Josip Merolla PharmD., BCPS Clinical Pharmacist Pager [phone] 09/13/2015 8:01 AM

## 2015-09-13 NOTE — Progress Notes (Signed)
Subjective:  Self extubated this morning. Now on NRB. Reports his breathing is better. Denies chest pain.  Objective:  Vital Signs in the last 24 hours: Temp:  [98.1 F (36.7 C)-99 F (37.2 C)] 98.1 F (36.7 C) (11/15 0506) Pulse Rate:  [80-98] 96 (11/15 0319) Resp:  [9-21] 16 (11/15 0700) BP: (115-156)/(37-107) 127/107 mmHg (11/15 0700) SpO2:  [87 %-99 %] 98 % (11/15 0738) FiO2 (%):  [50 %-60 %] 50 % (11/15 0557) Weight:  [160 lb (72.576 kg)] 160 lb (72.576 kg) (11/15 0429)  Intake/Output from previous day: 11/14 0701 - 11/15 0700 In: 1139.1 [I.V.:267.1; Blood:320; NG/GT:190; IV Piggyback:362] Out: 340 [Urine:340]  Physical Exam: NAD HEENT: normal Neck: supple Lungs: bibasilar rales CV: RRR without murmur or gallop Abd: soft, NT, Positive BS, no hepatomegaly Ext: no C/C/E, distal pulses intact and equal Skin: warm/dry no rash  Lab Results:  Recent Labs  09/13/15 0045 09/13/15 0625  WBC 15.4* 16.3*  HGB 6.7* 8.1*  PLT 152 154    Recent Labs  09/12/15 0235 09/13/15 0625  NA 144 141  K 3.7 4.3  CL 105 104  CO2 28 23  GLUCOSE 130* 145*  BUN 114* 149*  CREATININE 4.38* 5.69*    Recent Labs  09/11/15 2020 09/12/15 0235  TROPONINI 11.37* 18.51*    Cardiac Studies: Echo 09/11/2015: LV EF 65-70%, mild LVH, grade 1 diastolic dysfunction, PA peak pressure 65 mmHg, trivial pericardial effusion  Echo 09/12/2015: Normal systolic function, normal wall motion, no regional wall motion abnormalities.  Tele: NSR, nonsustained SVT   Assessment/Plan:  81 year old man with history of DM2, HTN, HLD, PVD, CAD, CKD stage 4, carotid artery disease, chronic anemia presenting with dyspnea x 2 days.   NSTEMI, CAD: Troponin 6.15 on admission now up to 18.51. No wall motion abnormalities and LV EF 65-70% on echo 11/13. Repeat EKG with sinus rhythm, mild depression in II, aVF unchanged from previous EKG. Limited echo with normal systolic function and no regional wall  motion abnormalities. -No urgent indication for cath at this moment -Heparin gtt and follow hgb closely. -Metoprolol 5mg  IV q6hr.  -Atorvastatin 80mg  daily. -Plavix 75mg  daily  Acute diastolic CHF: BNP 2200 on admission. CVP 12 this morning. 340ml of urine out over last 24 hours after 40mg  Lasix at 845, 120mg  Lasix at 1400, 80mg  Lasix and 2.5mg  metolazone at 1830.  CAP, septic shock, acute hypoxic respiratory failure: CXR on admission with bilateral mid to lower lung zone airspace opacities. Intubated on 11/13. Lactic acidosis on admission to 6.69 which has resolved. -Rocephin and azithromycin per PCCM  Acute on chronic anemia, GI bleed: Hgb 7.5 on admission and received 1u pRBC on 11/13. Hgb 8.3 post transfusion. Hgb down to 6.7 overnight and received 1 u pRBC. Hgb up to 8.1 this morning. -PPI -GI following, further workup pending. -Follow hgb closely while on heparin gtt.  Acute on CKD stage 4: Nephrologist is Dr. Darrick Pennaeterding. Baseline creatinine around 4. Creatinine on admission 3.82. Up to 5.69 today from 4.38 yesterday. 340ml of urine out over last 24 hours after 40mg  Lasix at 845, 120mg  Lasix at 1400, 80mg  Lasix and 2.5mg  metolazone at 1830. -Consult nephrology  DM2: -SSI  Griffin BasilKrall, Jennifer, M.D. 09/13/2015, 7:47 AM  The patient was seen, examined and discussed with the IM resident Griffin BasilKrall, Jennifer, M.D. and I agree with the above.   [name] is a 81 year old male with h/o CKD stage 4, HTN admitted with acute hypoxic respiratory failure requiring intubation,  found to have elevated lactic acid, leukocytosis possibly due to pneumonia and severe anemia suspicious for GIB. He is also in acute diastolic CHF and elevated troponin that was originally believed to be demand ischemia, however significant uptrending of troponin 6-->11-->18 that qualifies for NSTEMI. The patient denies chest pain. He was started on iv Heparin yesterday and dropped Hb from 7.5 --> 6.7, received 1 unit of PRBC -->  8.1. He self extubated himself and is currently doing well on BiPAP.  His Crea continues deteriorating and he only made 300 cc of urine despite total of 240 mg of Lasix and metolazone yesterday, Crea  3.8 --> 4.3 --> 5.6. Yesterday's echo showed hyperdynamic LVEF, there appears to be mild basal and mid inferior wall ischemia, but overall LVEF is preserved. Considering his overall sepsis , CKD stage 4 we were holding off cath right now to avoid HD, however the patient seems to be heading that way. We called renal consult, the patient is anuric, fluid overloaded. If they decide to start HD and the patient agrees we will plan on a cath in the next few days.   Lars Masson 09/13/2015

## 2015-09-13 NOTE — Progress Notes (Signed)
ANTICOAGULATION CONSULT NOTE - Follow Up Consult  Pharmacy Consult for Heparin Indication: NSTEMI  Allergies  Allergen Reactions  . Labetalol Swelling    angioedema    Patient Measurements: Height: 5\' 6"  (167.6 cm) Weight: 160 lb (72.576 kg) IBW/kg (Calculated) : 63.8   Vital Signs: Temp: 99 F (37.2 C) (11/15 1121) Temp Source: Axillary (11/15 1121) BP: 154/59 mmHg (11/15 1500) Pulse Rate: 97 (11/15 1121)  Labs:  Recent Labs  09/11/15 0955  09/11/15 1450 09/11/15 2020 09/12/15 0235  09/12/15 2011 09/13/15 0045 09/13/15 0625 09/13/15 1115 09/13/15 1500 09/13/15 1505  HGB 7.5*  < > 8.3*  --   --   < >  --  6.7* 8.1*  --  8.6*  --   HCT 22.6*  < > 25.0*  --   --   < >  --  20.5* 24.0*  --  25.2*  --   PLT 216  --  195  --   --   < >  --  152 154  --  172  --   APTT  --   --  31  --   --   --   --   --   --   --   --   --   LABPROT 15.8*  --  16.8*  --   --   --   --   --   --   --   --   --   INR 1.25  --  1.35  --   --   --   --   --   --   --   --   --   HEPARINUNFRC  --   --   --   --   --   --  0.12*  --  0.29*  --   --  0.50  CREATININE 3.82*  < > 3.84*  --  4.38*  --   --   --  5.69*  --   --   --   TROPONINI  --   < > 6.15* 11.37* 18.51*  --   --   --   --  11.65*  --   --   < > = values in this interval not displayed.  Estimated Creatinine Clearance: 9 mL/min (by C-G formula based on Cr of 5.69).   Medications:  Scheduled:  . antiseptic oral rinse  7 mL Mouth Rinse Q2H  . atorvastatin  80 mg Per Tube q1800  . azithromycin  500 mg Intravenous Q24H  . cefTRIAXone (ROCEPHIN)  IV  1 g Intravenous Q24H  . chlorhexidine gluconate  15 mL Mouth Rinse BID  . cloNIDine  0.1 mg Per Tube TID  . clopidogrel  75 mg Per Tube Daily  . furosemide  160 mg Intravenous Q6H  . hydrALAZINE  50 mg Per Tube TID  . insulin aspart  0-9 Units Subcutaneous Q4H  . ipratropium-albuterol  3 mL Nebulization Q6H  . [START ON 09/14/2015] metolazone  10 mg Oral Daily  .  metoprolol  5 mg Intravenous 4 times per day  . pantoprazole (PROTONIX) IV  40 mg Intravenous Q12H  . terazosin  10 mg Per Tube QHS    Assessment: 84 yom with uptrending troponins and likely diagnosis of NSTEMI vs demand ischemia. Patient had no urgent indication for cath but heparin drip started. Patient does havesuspected GI bleed (heme + stools), continue PPI per GI. RN reports no gross bleeding noted overnight. Patient  transfused after Hgb down to 6.7 overnight. Hgb 8.1 this AM.  Heparin level now at goal on 1050/hr (0.5). Will aim for lower end of goal with possible GIB.  Goal of Therapy:  Heparin level 0.3-0.5 units/ml Monitor platelets by anticoagulation protocol: Yes   Plan:  - Decrease heparin infusion to 1050 units/hr - Check HL in am - Monitor CBC every 8 hours - Monitor for s/sx of bleeding   Sheppard Coil PharmD., BCPS Clinical Pharmacist Pager [phone] 09/13/2015 3:48 PM

## 2015-09-13 NOTE — Progress Notes (Signed)
eLink Physician-Brief Progress Note Patient Name: [name] DOB: 04/21/1930 MRN: [id number]   Date of Service  09/13/2015  HPI/Events of Note  Hypoxia - decreased sats into 80's and increased RR to 17 on VM oxygen.   eICU Interventions  Will order: 1. BIPAP - IPAP 12 and EPAP 5. Titrate IPAP and EPAP per respiratory.      Intervention Category Intermediate Interventions: Respiratory distress - evaluation and management  Sommer,Steven Eugene 09/13/2015, 7:44 PM

## 2015-09-13 NOTE — Progress Notes (Signed)
Pt self extubated at 0700 with mittens on. Pt sats dropped to 79%, NRB applied. Pt sats increased to 98%. Pt alert and states "thank you". Respiratory called and notified.  Elink made aware. Report given to dayshift RN who will address with MD on rounds. Pt is in no distress at this time.

## 2015-09-13 NOTE — Progress Notes (Addendum)
Attempted to trial patient off CPAP to venti-mask per MD order. Patient sats decreased to low 80's on 14 liters. Placed patient on 100% NRB. Will continue to monitor and titrate FiO2 as patient tolerates. Family meeting planned with patient's daughter Eunice BlaseDebbie at 0800 tomorrow morning.   Dr. Arsenio LoaderSommer made aware of patient sats low 90's on NRB. Bipap ordered. Will continue to monitor.

## 2015-09-13 NOTE — Progress Notes (Signed)
CRITICAL VALUE ALERT  Critical value received:  Hg.6.7  Date of notification:  09/13/15  Time of notification:  0105  Critical value read back:Yes.    Nurse who received alert:  Dorna LeitzAmanda guffey RN  MD notified (1st page):  Dr.Feinstein   Time of first page:  0108  Responding MD:  Dr. Tyson AliasFeinstein  Time MD responded:  [phone]

## 2015-09-13 NOTE — Progress Notes (Signed)
Patient tachycardic 100-110s, increased ectopy - Non sustained SVT. Paged Dr. Nelson with cardiology. Orders for 10mg metoprolol IVP. Will continue to monitor. 

## 2015-09-13 NOTE — Progress Notes (Signed)
eLink Physician-Brief Progress Note Patient Name: [name] DOB: 02/08/1930 MRN: [id number]   Date of Service  09/13/2015  HPI/Events of Note  anemia  eICU Interventions  1     Intervention Category Intermediate Interventions: Communication with other healthcare providers and/or family  Nelda BucksFEINSTEIN,DANIEL J. 09/13/2015, 1:09 AM

## 2015-09-13 NOTE — Progress Notes (Signed)
RT note: Placed pt. on NIV @ 12 cmH20 via Servo-i, CCM/Dr. Delton SeeNelson aware on floor in unit, pt. self extubated this a.m. @ shift change, sats 88-91% has been on NRB/n/c thru vm, RN's aware,plan to wean as tolerated,RT to monitor.

## 2015-09-14 ENCOUNTER — Inpatient Hospital Stay (HOSPITAL_COMMUNITY): Payer: Medicare Other

## 2015-09-14 ENCOUNTER — Inpatient Hospital Stay (HOSPITAL_COMMUNITY): Admission: RE | Admit: 2015-09-14 | Payer: Medicare Other | Source: Ambulatory Visit

## 2015-09-14 DIAGNOSIS — N189 Chronic kidney disease, unspecified: Secondary | ICD-10-CM

## 2015-09-14 DIAGNOSIS — N179 Acute kidney failure, unspecified: Secondary | ICD-10-CM

## 2015-09-14 LAB — POCT I-STAT 3, ART BLOOD GAS (G3+)
ACID-BASE EXCESS: 1 mmol/L (ref 0.0–2.0)
BICARBONATE: 26.4 meq/L — AB (ref 20.0–24.0)
Bicarbonate: 24.4 mEq/L — ABNORMAL HIGH (ref 20.0–24.0)
O2 Saturation: 84 %
O2 Saturation: 91 %
PCO2 ART: 39.3 mmHg (ref 35.0–45.0)
PCO2 ART: 42.3 mmHg (ref 35.0–45.0)
PH ART: 7.401 (ref 7.350–7.450)
PH ART: 7.401 (ref 7.350–7.450)
PO2 ART: 47 mmHg — AB (ref 80.0–100.0)
PO2 ART: 60 mmHg — AB (ref 80.0–100.0)
Patient temperature: 97.5
TCO2: 26 mmol/L (ref 0–100)
TCO2: 28 mmol/L (ref 0–100)

## 2015-09-14 LAB — CBC
HCT: 23.9 % — ABNORMAL LOW (ref 39.0–52.0)
HEMATOCRIT: 26 % — AB (ref 39.0–52.0)
HEMOGLOBIN: 8.5 g/dL — AB (ref 13.0–17.0)
Hemoglobin: 8.1 g/dL — ABNORMAL LOW (ref 13.0–17.0)
MCH: 30.8 pg (ref 26.0–34.0)
MCH: 29.9 pg (ref 26.0–34.0)
MCHC: 33.9 g/dL (ref 30.0–36.0)
MCHC: 32.7 g/dL (ref 30.0–36.0)
MCV: 90.9 fL (ref 78.0–100.0)
MCV: 91.5 fL (ref 78.0–100.0)
PLATELETS: 140 10*3/uL — AB (ref 150–400)
Platelets: 156 10*3/uL (ref 150–400)
RBC: 2.63 MIL/uL — AB (ref 4.22–5.81)
RBC: 2.84 MIL/uL — ABNORMAL LOW (ref 4.22–5.81)
RDW: 17.2 % — AB (ref 11.5–15.5)
RDW: 17 % — AB (ref 11.5–15.5)
WBC: 13.4 10*3/uL — AB (ref 4.0–10.5)
WBC: 14.7 10*3/uL — AB (ref 4.0–10.5)

## 2015-09-14 LAB — CBC WITH DIFFERENTIAL/PLATELET
BASOS ABS: 0 10*3/uL (ref 0.0–0.1)
BASOS PCT: 0 %
EOS ABS: 0 10*3/uL (ref 0.0–0.7)
EOS PCT: 0 %
HCT: 25.4 % — ABNORMAL LOW (ref 39.0–52.0)
Hemoglobin: 8.6 g/dL — ABNORMAL LOW (ref 13.0–17.0)
LYMPHS PCT: 4 %
Lymphs Abs: 0.7 10*3/uL (ref 0.7–4.0)
MCH: 30.9 pg (ref 26.0–34.0)
MCHC: 33.9 g/dL (ref 30.0–36.0)
MCV: 91.4 fL (ref 78.0–100.0)
MONO ABS: 1.2 10*3/uL — AB (ref 0.1–1.0)
Monocytes Relative: 7 %
Neutro Abs: 14.6 10*3/uL — ABNORMAL HIGH (ref 1.7–7.7)
Neutrophils Relative %: 89 %
PLATELETS: 166 10*3/uL (ref 150–400)
RBC: 2.78 MIL/uL — AB (ref 4.22–5.81)
RDW: 17.4 % — AB (ref 11.5–15.5)
WBC: 16.4 10*3/uL — AB (ref 4.0–10.5)

## 2015-09-14 LAB — GLUCOSE, CAPILLARY
GLUCOSE-CAPILLARY: 112 mg/dL — AB (ref 65–99)
GLUCOSE-CAPILLARY: 120 mg/dL — AB (ref 65–99)
GLUCOSE-CAPILLARY: 127 mg/dL — AB (ref 65–99)
GLUCOSE-CAPILLARY: 167 mg/dL — AB (ref 65–99)
Glucose-Capillary: 132 mg/dL — ABNORMAL HIGH (ref 65–99)
Glucose-Capillary: 97 mg/dL (ref 65–99)
Glucose-Capillary: 98 mg/dL (ref 65–99)

## 2015-09-14 LAB — RENAL FUNCTION PANEL
ALBUMIN: 2.5 g/dL — AB (ref 3.5–5.0)
Albumin: 2.4 g/dL — ABNORMAL LOW (ref 3.5–5.0)
Anion gap: 18 — ABNORMAL HIGH (ref 5–15)
Anion gap: 14 (ref 5–15)
BUN: 166 mg/dL — AB (ref 6–20)
BUN: 151 mg/dL — ABNORMAL HIGH (ref 6–20)
CALCIUM: 8.9 mg/dL (ref 8.9–10.3)
CHLORIDE: 105 mmol/L (ref 101–111)
CO2: 23 mmol/L (ref 22–32)
CO2: 26 mmol/L (ref 22–32)
CREATININE: 6.01 mg/dL — AB (ref 0.61–1.24)
CREATININE: 4.71 mg/dL — AB (ref 0.61–1.24)
Calcium: 8.5 mg/dL — ABNORMAL LOW (ref 8.9–10.3)
Chloride: 104 mmol/L (ref 101–111)
GFR calc non Af Amer: 10 mL/min — ABNORMAL LOW (ref >60)
GFR, EST AFRICAN AMERICAN: 9 mL/min — AB (ref >60)
GFR, EST AFRICAN AMERICAN: 12 mL/min — AB (ref >60)
GFR, EST NON AFRICAN AMERICAN: 8 mL/min — AB (ref >60)
Glucose, Bld: 147 mg/dL — ABNORMAL HIGH (ref 65–99)
Glucose, Bld: 130 mg/dL — ABNORMAL HIGH (ref 65–99)
POTASSIUM: 3.8 mmol/L (ref 3.5–5.1)
Phosphorus: 7.7 mg/dL — ABNORMAL HIGH (ref 2.5–4.6)
Phosphorus: 6.6 mg/dL — ABNORMAL HIGH (ref 2.5–4.6)
Potassium: 4.2 mmol/L (ref 3.5–5.1)
SODIUM: 145 mmol/L (ref 135–145)
Sodium: 145 mmol/L (ref 135–145)

## 2015-09-14 LAB — TYPE AND SCREEN
ABO/RH(D): B POS
ANTIBODY SCREEN: NEGATIVE
UNIT DIVISION: 0
UNIT DIVISION: 0
Unit division: 0

## 2015-09-14 LAB — CULTURE, RESPIRATORY

## 2015-09-14 LAB — CULTURE, RESPIRATORY W GRAM STAIN

## 2015-09-14 LAB — APTT: APTT: 27 s (ref 24–37)

## 2015-09-14 LAB — MAGNESIUM: MAGNESIUM: 2.9 mg/dL — AB (ref 1.7–2.4)

## 2015-09-14 MED ORDER — MIDAZOLAM HCL 2 MG/2ML IJ SOLN
INTRAMUSCULAR | Status: AC
  Filled 2015-09-14: qty 2

## 2015-09-14 MED ORDER — ETOMIDATE 2 MG/ML IV SOLN
20.0000 mg | Freq: Once | INTRAVENOUS | Status: AC
  Administered 2015-09-14: 20 mg via INTRAVENOUS

## 2015-09-14 MED ORDER — MIDAZOLAM HCL 2 MG/2ML IJ SOLN
1.0000 mg | INTRAMUSCULAR | Status: DC | PRN
  Administered 2015-09-14 (×5): 2 mg via INTRAVENOUS
  Administered 2015-09-14: 1 mg via INTRAVENOUS
  Administered 2015-09-15 – 2015-09-17 (×11): 2 mg via INTRAVENOUS
  Filled 2015-09-14 (×17): qty 2

## 2015-09-14 MED ORDER — ROCURONIUM BROMIDE 50 MG/5ML IV SOLN
20.0000 mg | Freq: Once | INTRAVENOUS | Status: AC
  Administered 2015-09-14: 20 mg via INTRAVENOUS
  Filled 2015-09-14: qty 2

## 2015-09-14 MED ORDER — PRISMASOL BGK 4/2.5 32-4-2.5 MEQ/L IV SOLN
INTRAVENOUS | Status: DC
Start: 2015-09-14 — End: 2015-09-18
  Administered 2015-09-14 – 2015-09-17 (×5): via INTRAVENOUS_CENTRAL
  Filled 2015-09-14 (×8): qty 5000

## 2015-09-14 MED ORDER — SODIUM CHLORIDE 0.9 % FOR CRRT
INTRAVENOUS_CENTRAL | Status: DC | PRN
  Filled 2015-09-14: qty 1000

## 2015-09-14 MED ORDER — METOPROLOL TARTRATE 1 MG/ML IV SOLN
10.0000 mg | INTRAVENOUS | Status: DC
  Administered 2015-09-14 – 2015-09-16 (×11): 10 mg via INTRAVENOUS
  Filled 2015-09-14 (×12): qty 10

## 2015-09-14 MED ORDER — LORAZEPAM 2 MG/ML IJ SOLN
0.2500 mg | Freq: Once | INTRAMUSCULAR | Status: AC
  Administered 2015-09-14: 0.25 mg via INTRAVENOUS

## 2015-09-14 MED ORDER — FENTANYL CITRATE (PF) 100 MCG/2ML IJ SOLN
25.0000 ug | INTRAMUSCULAR | Status: DC | PRN
  Administered 2015-09-14 – 2015-09-16 (×8): 50 ug via INTRAVENOUS
  Administered 2015-09-17: 25 ug via INTRAVENOUS
  Administered 2015-09-17 (×2): 50 ug via INTRAVENOUS
  Filled 2015-09-14 (×7): qty 2

## 2015-09-14 MED ORDER — HYDRALAZINE HCL 20 MG/ML IJ SOLN
10.0000 mg | Freq: Four times a day (QID) | INTRAMUSCULAR | Status: DC
  Administered 2015-09-14 – 2015-09-15 (×6): 10 mg via INTRAVENOUS
  Filled 2015-09-14 (×6): qty 1

## 2015-09-14 MED ORDER — PRISMASOL BGK 4/2.5 32-4-2.5 MEQ/L IV SOLN
INTRAVENOUS | Status: DC
Start: 2015-09-14 — End: 2015-09-18
  Administered 2015-09-14 – 2015-09-18 (×31): via INTRAVENOUS_CENTRAL
  Filled 2015-09-14 (×44): qty 5000

## 2015-09-14 MED ORDER — LORAZEPAM 2 MG/ML IJ SOLN
INTRAMUSCULAR | Status: AC
  Filled 2015-09-14: qty 1

## 2015-09-14 MED ORDER — FENTANYL CITRATE (PF) 100 MCG/2ML IJ SOLN
INTRAMUSCULAR | Status: AC
  Administered 2015-09-14: 50 ug
  Filled 2015-09-14: qty 2

## 2015-09-14 MED ORDER — SODIUM CHLORIDE 0.9 % IV SOLN
INTRAVENOUS | Status: DC
  Administered 2015-09-17: 23:00:00 via INTRAVENOUS

## 2015-09-14 MED ORDER — MIDAZOLAM HCL 2 MG/2ML IJ SOLN
4.0000 mg | Freq: Once | INTRAMUSCULAR | Status: AC
  Administered 2015-09-14: 2 mg via INTRAVENOUS

## 2015-09-14 MED ORDER — MIDAZOLAM HCL 2 MG/2ML IJ SOLN
INTRAMUSCULAR | Status: AC
  Administered 2015-09-14: 1 mg
  Filled 2015-09-14: qty 2

## 2015-09-14 MED ORDER — HEPARIN SODIUM (PORCINE) 1000 UNIT/ML DIALYSIS
1000.0000 [IU] | INTRAMUSCULAR | Status: DC | PRN
  Filled 2015-09-14: qty 6

## 2015-09-14 MED ORDER — PRISMASOL BGK 4/2.5 32-4-2.5 MEQ/L IV SOLN
INTRAVENOUS | Status: DC
  Administered 2015-09-14 – 2015-09-17 (×3): via INTRAVENOUS_CENTRAL
  Filled 2015-09-14 (×6): qty 5000

## 2015-09-14 NOTE — Progress Notes (Signed)
PULMONARY / CRITICAL CARE MEDICINE   Name: [name] MRN: [id number] DOB: 01/20/1930    ADMISSION DATE:  09/11/2015 CONSULTATION DATE:  11/13  REFERRING MD :  Butler Denmarkizwan   CHIEF COMPLAINT:  Acute Respiratory Failure  INITIAL PRESENTATION: 81 y/o male w/ stage IV CKD , HTN and CAD. Presents to the ED on 11/13 w/ acute resp failure in setting of acute pulmonary edema, hypertensive crisis and NSTEMI +/- CAP CCM asked to assess given degree of respiratory failure and ongoing ischemia w/ concern the pt would require intubation.   STUDIES:  ECHO 11/13 - Systolic function wasvigorous. The estimated ejection fraction was in the range of 65% to 70%. Doppler parameters are consistent with abnormal leftventricular relaxation (grade 1 diastolic dysfunction). - Left atrium: The atrium was mildly dilated. - Pulmonary arteries: PA peak pressure: 65 mm Hg (S). Port CXR 11/13 - ETT & CVC in good position. Bilateral lower lung opacities.  SIGNIFICANT EVENTS: 11/13 - Admit  11/13 - Intubation  11/15 - Self-extubation  HISTORY OF PRESENT ILLNESS:   [age] year old male w/sig h/o hypertension, diabetes, symptomatic bradycardia, peripheral vascular disease, stage IV chronic kidney disease, anemia of chronic kidney disease on Procrit, and dyslipidemia. [facility]: 2 days of shortness of breath.  He's not had any cough or chest pain he's had subjective chills but no fevers. He reported some increase in lower extremity edema. Because of significant shortness of breath he presented to the ER today the EMS. Upon EMS arrival to the home patient had increased work of breathing with extremely labored effort and use of accessory muscles. On exam he was found to have bilateral coarse crackles. Pulse oximetry on room air was 80%. Patient received 2 DuoNeb's without any improvement in symptoms. BiPAP was initiated by EMS. In ED CXR showed R>L airspace disease. Admitted to the IM service w/  working dx of acute resp failure in setting of pulmonary edema in setting of NSTEMI +/- PNA. PCCM asked to see re: need for intubation given AMI.   SUBJECTIVE: Patient has been on noninvasive positive pressure ventilation for the majority of the night to support his oxygenation. He denies any difficulty breathing on BiPAP. He denies any chest pain or pressure.  REVIEW OF SYSTEMS:  Denies any nausea or abdominal pain. Difficult to assess given patient is currently on BiPAP for oxygen support. No subjective fever or chills.   VITAL SIGNS: Temp:  [97.1 F (36.2 C)-98.3 F (36.8 C)] 97.1 F (36.2 C) (11/16 0749) Pulse Rate:  [91-95] 91 (11/16 0749) Resp:  [10-27] 13 (11/16 1200) BP: (129-169)/(42-93) 146/42 mmHg (11/16 1200) SpO2:  [80 %-100 %] 100 % (11/16 1200) FiO2 (%):  [50 %-100 %] 65 % (11/16 1200) Weight:  [158 lb 11.2 oz (71.986 kg)] 158 lb 11.2 oz (71.986 kg) (11/16 0434) HEMODYNAMICS: CVP:  [9 mmHg-15 mmHg] 9 mmHg VENTILATOR SETTINGS: Vent Mode:  [-] BIPAP;PCV FiO2 (%):  [50 %-100 %] 65 % Set Rate:  [12 bmp] 12 bmp PEEP:  [10 cmH20-12 cmH20] 10 cmH20 INTAKE / OUTPUT:  Intake/Output Summary (Last 24 hours) at 09/14/15 1214 Last data filed at 09/14/15 1100  Gross per 24 hour  Intake 619.49 ml  Output   1975 ml  Net -1355.51 ml    PHYSICAL EXAMINATION: General:  Awake. No distress. Currently requiring BiPAP for oxygenation and work of breathing. Integument:  Warm & dry. No rash on exposed skin. Right subclavian central venous catheter in place. HEENT: Full  facemask in place. No scleral icterus or injection. Cardiovascular:  Regular rate. No appreciable JVD.  Pulmonary:  Normal work of breathing on BiPAP. No accessory muscle use. Poor breath sounds in bases. Abdomen: Soft. Normal bowel sounds. Nondistended.  Neurological: Following commands and giving thumbs up appropriately. Moving all 4 extremities equally and grossly nonfocal.  LABS:  CBC  Recent Labs Lab  09/13/15 1500 09/13/15 2145 09/14/15 0610  WBC 18.4* 16.2* 16.4*  HGB 8.6* 8.4* 8.6*  HCT 25.2* 25.5* 25.4*  PLT 172 164 166   Coag's  Recent Labs Lab 09/11/15 0955 09/11/15 1450 09/14/15 1115  APTT  --  31 27  INR 1.25 1.35  --    BMET  Recent Labs Lab 09/12/15 0235 09/13/15 0625 09/14/15 0610  NA 144 141 145  K 3.7 4.3 4.2  CL 105 104 104  CO2 BUN 114* 149* 166*  CREATININE 4.38* 5.69* 6.01*  GLUCOSE 130* 145* 147*   Electrolytes  Recent Labs Lab 09/12/15 0235 09/12/15 0940 09/13/15 0625 09/14/15 0610  CALCIUM 8.6*  --  8.4* 8.9  MG  --  2.7* 2.8* 2.9*  PHOS  --   --  7.5* 7.7*   Sepsis Markers  Recent Labs Lab 09/11/15 1706 09/11/15 2020 09/12/15 0235 09/12/15 0945 09/13/15 0625  LATICACIDVEN  --  2.3*  --  1.1 0.9  PROCALCITON 5.48  --  8.57  --  8.84   ABG  Recent Labs Lab 09/11/15 1242 09/11/15 1712  PHART 7.408 7.319*  PCO2ART 31.0* 57.1*  PO2ART 84.0 302.0*   Liver Enzymes  Recent Labs Lab 09/11/15 0955 09/11/15 1450 09/13/15 0625 09/14/15 0610  AST 73* 71*  --   --   ALT 22 23  --   --   ALKPHOS 82 84  --   --   BILITOT 1.3* 1.0  --   --   ALBUMIN 3.1* 3.0* 2.5* 2.5*   Cardiac Enzymes  Recent Labs Lab 09/11/15 2020 09/12/15 0235 09/13/15 1115  TROPONINI 11.37* 18.51* 11.65*   Glucose  Recent Labs Lab 09/13/15 1120 09/13/15 1505 09/13/15 1956 09/13/15 2350 09/14/15 0439 09/14/15 0746  GLUCAP 175* 116* 146* 127* 120* 132*    Imaging No results found.   ASSESSMENT / PLAN:  PULMONARY OETT 11/13 - 11/15 (self extubation) A: Acute Hypoxic respiratory Failure - Worsening. Pulmonary edema vs Severe CAP. Now requiring BiPAP. Pulmonary Edema - Decompensated Diastolic CHF Possible Severe CAP - PCT elevated & Ctx pending  P:   Plan for elective intubation today Weaning FiO2 to 0.5 for Sat >94% Duoneb q6hr See ID section  Diuresis with HD  CARDIOVASCULAR R Subclavian CVC 11/13>>> A:   NSTEMI vs Demand Ischemia - H/O CAD.  Acute Decompensation of Diastolic CHF Hypertensive Crisis - Resolved.  H/O PVD & Carotid Artery Disease  P:  Cardiology following Heparin drip currently on hold Transfuse for goal hgb > 8 CVP from CVL Monitor on telemetry Diuresis with HD Lopressor  IV q6hr Hydralazine  tid Plavix daily ASA  qhs Lipitor  qhs Clonidine 0.1mg  tid  RENAL A:   Acute on Chronic Renal Failure - Worsening but UOP improving. Lactic acidosis - Resolved. Hypokalemia - Resolved.  P:   Nephrology consulted  Patient now requesting HD Plan for HD catheter placement today Plan for diuresis with HD Strict I&O Monitoring UOP with Foley Monitoring electrolytes daily Continue Terazosin  GASTROINTESTINAL A:   Heme + stools - No obvious GI blood loss.  P:  NPO for now GI consulted but patient too unstable Protonix  IV q12hr  HEMATOLOGIC A:   Anemia of chronic disease - Hgb improving. S/P transfusion 1u PRBC 11/13 & 1u PRBC on 11/14. Heme + stools - No hematochezia or melena. Leukocytosis - Stable. Multifactorial in etiology.  P:  Trending Hgb w/ CBC q8hr while on heparin gtt. Trending Leukcytosis w/ CBC Heparin gtt currently off Checking stat aPTT  INFECTIOUS A:   Possible Sepsis Possible Severe CAP - Resp ctx negative.  P:   Procalcitonin Algorithm  Ctx: Trach Asp 11/14 - Oral Flora Blood x2 11/13>>> Urine Strep 11/13 - Negative Urine Legionella 11/13 - Negative Influenza PCR 11/14 - Negative  Abx: Azithromycin 11/13>>> Rocephin 11/13>>>  ENDOCRINE A:   H/O DM - BG controlled  P:   Accu-Checks q4hr Low dose SSI Protocol  NEUROLOGIC A:   Sedation needs on ventilator No acute issues  P:   Goal RASS:  0 to -1 on ventilator Plan for intermittent Fentanyl & Versed while on ventilator  FAMILY  - Updates:  Daughter at bedside to update this morning & updated by both me & Dr. Hyman Hopes. - Inter-disciplinary family  meet or Palliative Care meeting due by:  11/20  TODAY'S SUMMARY: Patient's respiratory status continues to slowly worsen. Given that he is now dependent on noninvasive positive pressure ventilation I feel that electively intubating him sooner rather than later is the safest course. The patient's wishes regarding intubation & hemodialysis have fluctuated dramatically. At this moment as I have asked him he is giving me the appropriate thumbs up and seems to be following commands. He nods yes that he wishes to undergo hemodialysis as well as endotracheal intubation. I recognize that this goes against some of his previous statements to both family and prior physicians. His daughter is at bedside and she wishes to proceed with intubation & hemodialysis at this time.  I have spent a total of 39  minutes of critical care time today caring for this patient, updating the patient's daughter at bedside, discussing the plan of care with Nephrology, & reviewing the patient's electronic medical record.  Donna Christen Jamison Neighbor, M.D. [facility] Pulmonary & Critical Care Pager:  [phone] After 3pm or if no response, call [phone]  09/14/2015, 12:14 PM

## 2015-09-14 NOTE — Procedures (Signed)
Hemodialysis Catheter Insertion Procedure Note Bryan ArchRoy C Duran [id number] 06/24/1930  Procedure: Insertion of Hemodialysis Catheter Indications: HD  Procedure Details Consent: Risks of procedure as well as the alternatives and risks of each were explained to the (patient/caregiver).  Consent for procedure obtained. Time Out: Verified patient identification, verified procedure, site/side was marked, verified correct patient position, special equipment/implants available, medications/allergies/relevent history reviewed, required imaging and test results available.  Performed  Maximum sterile technique was used including antiseptics, cap, gloves, gown, hand hygiene, mask and sheet. Skin prep: Chlorhexidine; local anesthetic administered A antimicrobial bonded/coated triple lumen catheter was placed in the right internal jugular vein using the Seldinger technique.  Evaluation Blood flow good Complications: No apparent complications Patient did tolerate procedure well. Chest X-ray ordered to verify placement.  CXR: pending.  Procedure performed under direct ultrasound guidance for real time vessel cannulation.      [name] Desai, [place] - [facility] Critical [facility] Medicine Pgr: [phone]  or [phone] 09/14/2015, 1:32 PM

## 2015-09-14 NOTE — Procedures (Signed)
Intubation Procedure Note [name] [id number] 05/14/1930  Procedure: Intubation Indications: Respiratory insufficiency  Procedure Details Consent: Risks of procedure as well as the alternatives and risks of each were explained to the (patient/caregiver).  Consent for procedure obtained. Time Out: Verified patient identification, verified procedure, site/side was marked, verified correct patient position, special equipment/implants available, medications/allergies/relevent history reviewed, required imaging and test results available.  Performed  Drugs:  20 mg Etomidate, 20 mg Rocuronium. VL x 1 using glidescope with # 4 blade. Grade 1 view. 7.5 tube visualized passing through vocal cords. Following intubation:  positive color change on ETCO2, condensation seen in endotracheal tube, equal breath sounds bilaterally.  Evaluation Hemodynamic Status: BP stable throughout; O2 sats: stable throughout Patient's Current Condition: stable Complications: No apparent complications Patient did tolerate procedure well. Chest X-ray ordered to verify placement.  CXR: pending.   [name], [place] - [facility] & [facility] Medicine Pager: [phone]  or [phone] 09/14/2015, 1:31 PM

## 2015-09-14 NOTE — Progress Notes (Signed)
Patient placed on BIPAP for WOB. Patient sat in 88%. Patient now sat 95% on 60% FIO2. Patient tolerating well. RT will continue to monitor.

## 2015-09-14 NOTE — Progress Notes (Signed)
eLink Physician-Brief Progress Note Patient Name: [name] DOB: 05/29/1930 MRN: [id number]   Date of Service  09/14/2015  HPI/Events of Note  dysynchronous on the vent, placed on PSV mode   eICU Interventions  Improved respiratory status, pO2~49. FiO2 60% Inc peep to 8     Intervention Category Major Interventions: Respiratory failure - evaluation and management  Evan Osburn 09/14/2015, 11:54 PM

## 2015-09-14 NOTE — Care Management Important Message (Signed)
Important Message  Patient Details  Name: [name] MRN: [id number] Date of Birth: 06/15/1930   Medicare Important Message Given:  Yes    [name] P [name] 09/14/2015, 12:38 PM

## 2015-09-14 NOTE — Progress Notes (Addendum)
[facility] ROUNDING NOTE   Subjective:   Interval History: some worsening in respiratory status and consideration being given to intubation  Objective:  Vital signs in last 24 hours:  Temp:  [97.1 F (36.2 C)-99 F (37.2 C)] 97.1 F (36.2 C) (11/16 0749) Pulse Rate:  [91-97] 91 (11/16 0749) Resp:  [10-27] 23 (11/16 1000) BP: (129-169)/(47-93) 152/64 mmHg (11/16 1016) SpO2:  [80 %-98 %] 98 % (11/16 1000) FiO2 (%):  [50 %-100 %] 65 % (11/16 1000) Weight:  [71.986 kg (158 lb 11.2 oz)] 71.986 kg (158 lb 11.2 oz) (11/16 0434)  Weight change: -0.59 kg (-1 lb 4.8 oz) Filed Weights   09/12/15 0448 09/13/15 0429 09/14/15 0434  Weight: 72.666 kg (160 lb 3.2 oz) 72.576 kg (160 lb) 71.986 kg (158 lb 11.2 oz)    Intake/Output: I/O last 3 completed shifts: In: 1180 [I.V.:272; Blood:320; NG/GT:90; IV Piggyback:498] Out: 1525 [Urine:1525]   Intake/Output this shift:  Total I/O In: 366 [IV Piggyback:366] Out: 450 [Urine:450]  GEN: lying in bed, NAD HEENT: normal NECK: supple LUNGS: anterior lung fields clear, CPAP in place CARDIAC: RRR without murmur ABD: soft, non-tender, +BS, distended somewhat EXT: no edema, pulses intact NEURO: non-focal Skin: bruising, warm, and dry, intact   Basic Metabolic Panel:  Recent Labs Lab 09/11/15 0955 09/11/15 1007 09/11/15 1450 09/12/15 0235 09/12/15 0940 09/13/15 0625 09/14/15 0610  NA 142 141 140 144  --  141 145  K 3.1* 3.2* 2.7* 3.7  --  4.3 4.2  CL 102 104 101 105  --  104 104  CO2 21*  --  22 28  --  23 23  GLUCOSE 247* 238* 369* 130*  --  145* 147*  BUN 102* 98* 106* 114*  --  149* 166*  CREATININE 3.82* 3.50* 3.84* 4.38*  --  5.69* 6.01*  CALCIUM 8.9  --  8.8* 8.6*  --  8.4* 8.9  MG 2.6*  --   --   --  2.7* 2.8* 2.9*  PHOS  --   --   --   --   --  7.5* 7.7*    Liver Function Tests:  Recent Labs Lab 09/11/15 0955 09/11/15 1450 09/13/15 0625 09/14/15 0610  AST 73* 71*  --   --   ALT 22 23  --   --    ALKPHOS 82 84  --   --   BILITOT 1.3* 1.0  --   --   PROT 5.8* 6.4*  --   --   ALBUMIN 3.1* 3.0* 2.5* 2.5*   No results for input(s): LIPASE, AMYLASE in the last 168 hours. No results for input(s): AMMONIA in the last 168 hours.  CBC:  Recent Labs Lab 09/11/15 0955  09/11/15 1450  09/13/15 0045 09/13/15 0625 09/13/15 1500 09/13/15 2145 09/14/15 0610  WBC 21.3*  --  19.3*  < > 15.4* 16.3* 18.4* 16.2* 16.4*  NEUTROABS 19.0*  --  18.5*  --   --  14.9*  --   --  14.6*  HGB 7.5*  < > 8.3*  < > 6.7* 8.1* 8.6* 8.4* 8.6*  HCT 22.6*  < > 25.0*  < > 20.5* 24.0* 25.2* 25.5* 25.4*  MCV 94.2  --  91.6  < > 93.2 92.7 92.0 92.1 91.4  PLT 216  --  195  < > 152 154 172 164 166  < > = values in this interval not displayed.  Cardiac Enzymes:  Recent Labs Lab 09/11/15 1450 09/11/15 2020  09/12/15 0235 09/13/15 1115  TROPONINI 6.15* 11.37* 18.51* 11.65*    BNP: Invalid input(s): POCBNP  CBG:  Recent Labs Lab 09/13/15 1505 09/13/15 1956 09/13/15 2350 09/14/15 0439 09/14/15 0746  GLUCAP 116* 146* 127* 120* 132*    Microbiology: Results for orders placed or performed during the hospital encounter of 09/11/15  Blood culture (routine x 2)     Status: None (Preliminary result)   Collection Time: 09/11/15  9:55 AM  Result Value Ref Range Status   Specimen Description BLOOD RIGHT ARM  Final   Special Requests BOTTLES DRAWN AEROBIC AND ANAEROBIC 5ML  Final   Culture NO GROWTH 2 DAYS  Final   Report Status PENDING  Incomplete  Blood culture (routine x 2)     Status: None (Preliminary result)   Collection Time: 09/11/15 10:55 AM  Result Value Ref Range Status   Specimen Description BLOOD RIGHT HAND  Final   Special Requests BOTTLES DRAWN AEROBIC AND ANAEROBIC 10ML  Final   Culture NO GROWTH 2 DAYS  Final   Report Status PENDING  Incomplete  MRSA PCR Screening     Status: None   Collection Time: 09/11/15  2:20 PM  Result Value Ref Range Status   MRSA by PCR NEGATIVE NEGATIVE  Final    Comment:        The GeneXpert MRSA Assay (FDA approved for NASAL specimens only), is one component of a comprehensive MRSA colonization surveillance program. It is not intended to diagnose MRSA infection nor to guide or monitor treatment for MRSA infections.   Culture, respiratory (NON-Expectorated)     Status: None   Collection Time: 09/12/15  4:52 AM  Result Value Ref Range Status   Specimen Description TRACHEAL ASPIRATE  Final   Special Requests NONE  Final   Gram Stain   Final    ABUNDANT WBC PRESENT,BOTH PMN AND MONONUCLEAR RARE SQUAMOUS EPITHELIAL CELLS PRESENT NO ORGANISMS SEEN Performed at [facility]    Culture   Final    RARE YEAST CONSISTENT WITH CANDIDA SPECIES Performed at [facility]    Report Status 09/14/2015 FINAL  Final    Coagulation Studies:  Recent Labs  09/11/15 1450  LABPROT 16.8*  INR 1.35    Urinalysis:  Recent Labs  09/11/15 1330  COLORURINE YELLOW  LABSPEC 1.015  PHURINE 5.0  GLUCOSEU NEGATIVE  HGBUR NEGATIVE  BILIRUBINUR NEGATIVE  KETONESUR NEGATIVE  PROTEINUR 100*  UROBILINOGEN 0.2  NITRITE NEGATIVE  LEUKOCYTESUR NEGATIVE      Imaging: No results found.   Medications:   . nitroGLYCERIN Stopped (09/12/15 0325)   . antiseptic oral rinse  7 mL Mouth Rinse Q2H  . atorvastatin  80 mg Per Tube q1800  . azithromycin  500 mg Intravenous Q24H  . cefTRIAXone (ROCEPHIN)  IV  1 g Intravenous Q24H  . chlorhexidine gluconate  15 mL Mouth Rinse BID  . cloNIDine  0.1 mg Per Tube TID  . clopidogrel  75 mg Per Tube Daily  . furosemide  160 mg Intravenous Q6H  . hydrALAZINE  10 mg Intravenous Q6H  . insulin aspart  0-9 Units Subcutaneous Q4H  . ipratropium-albuterol  3 mL Nebulization Q6H  . metolazone  10 mg Oral Daily  . metoprolol  10 mg Intravenous Q4H  . pantoprazole (PROTONIX) IV  40 mg Intravenous Q12H  . terazosin  10 mg Per Tube QHS   acetaminophen, nitroGLYCERIN, ondansetron (ZOFRAN)  IV  Assessment/ Plan:  1. AKI in CKD4: Nephrologist is Dr. Darrick Pennaeterding.  Baseline creatinine around 4. On admission Cr was at baseline but has continued to worsen. He is now oliguric. He has been given Lasix and metolazone without much UOP. Believe this is ATN with progression of renal disease. He only made 300 cc of urine despite total of 240 mg of Lasix and metolazone yesterday. Electrolytes are stable and no signs of uremia.  -Scr worsening -monitor I/Os and daily weights -trend labs -will diuresis with higher doses of Lasix and metolazone (Lasix 160 q6hrs and Metolazone  daily);  not wanting HD at this time -avoid nephrotoxic agents and contrast  -patient not amendable to HD at this time; no emergent need for HD. Will need to continue to have discussions with patient -palliative consult to helps with goals of care since declining HD.  2. Dyspnea: patient was admitted to ICU with acute hypoxic respiratory failure due to pulmonary edema +/- CAP. He has decompensated diastolic CHF. Lactic acidosis on admission to 6.69 which has resolved. -Pulmonology following -on rocephin and azithromycin   3. NSTEMI: Troponins continued to rise on admission qualifying for NSTEMI.  -cardiology following  Considering Right heart catheterization -holding off on cath in setting of possible HD -On heparin  4. Aneima: of chronic disease along with acute blood loss anemia from GI bleed(positive stool occult). S/p 1 u PRBCs.  -on PPI -GI following; unstable for procedure  Discussed condition with pulmonologist and Deb ( daughter )  Her father had voiced that he would not have wanted life support. She is meeting with her pastor later today and we shall be kept updated  LOS: 3 Teyana Pierron W  :85 AM  Family wishes to proceed with dialysis   Catheter to be placed by CCM

## 2015-09-14 NOTE — Progress Notes (Signed)
Pt has been breath stacking all evening. Dr. Dellie CatholicSommers called and suggest switching patient to PS/CPAP. Set: PS: 15/ CPAP: 5, 60% FiO2. ABG @ 2330.

## 2015-09-14 NOTE — Progress Notes (Signed)
eLink Physician-Brief Progress Note Patient Name: [name] DOB: 03/25/1930 MRN: [id number]   Date of Service  09/14/2015  HPI/Events of Note  Patient is stacking breaths on the ventilator with mode set to [facility]. Mode changes to PSV with PS = 15 and P = 5. Taking 900 mL Tidal Volumes with these settings. Ve = 10+ L/min.  eICU Interventions  Will leave him on PSV with back up rate and check ABG in 1 hour.      Intervention Category Major Interventions: Respiratory failure - evaluation and management  Karuna Balducci Eugene 09/14/2015, 10:38 PM

## 2015-09-14 NOTE — Progress Notes (Signed)
eLink Physician-Brief Progress Note Patient Name: [name] DOB: 11/17/1929 MRN: [id number]   Date of Service  09/14/2015  HPI/Events of Note  Review of KUB for NGT placement reveals the NGT tip to be in the stomach, however, the side port is at the level of the GE junction.   eICU Interventions  Will order: 1. Advance NGT 8 cm. 2. Repeat KUB for NGT position.      Intervention Category Intermediate Interventions: Diagnostic test evaluation  [name],[name] 09/14/2015, 9:49 PM

## 2015-09-14 NOTE — Progress Notes (Signed)
30 ml of Fentanyl wasted in sink with Suzzette RighterErin Smith, RN.

## 2015-09-14 NOTE — Progress Notes (Signed)
Subjective:  On BiPAP this morning, was placed back on last night due to increased work of breathing and hypoxia on venti mask. No chest pain.  Marland Kitchen. antiseptic oral rinse  7 mL Mouth Rinse Q2H  . atorvastatin  80 mg Per Tube q1800  . azithromycin  500 mg Intravenous Q24H  . cefTRIAXone (ROCEPHIN)  IV  1 g Intravenous Q24H  . chlorhexidine gluconate  15 mL Mouth Rinse BID  . cloNIDine  0.1 mg Per Tube TID  . clopidogrel  75 mg Per Tube Daily  . furosemide  160 mg Intravenous Q6H  . hydrALAZINE  50 mg Per Tube TID  . insulin aspart  0-9 Units Subcutaneous Q4H  . ipratropium-albuterol  3 mL Nebulization Q6H  . metolazone  10 mg Oral Daily  . metoprolol  10 mg Intravenous 4 times per day  . pantoprazole (PROTONIX) IV  40 mg Intravenous Q12H  . terazosin  10 mg Per Tube QHS   . nitroGLYCERIN Stopped (09/12/15 0325)    Objective:  Vital Signs in the last 24 hours: Temp:  [97.1 F (36.2 C)-99.1 F (37.3 C)] 97.1 F (36.2 C) (11/16 0749) Pulse Rate:  [91-97] 91 (11/16 0749) Resp:  [10-27] 16 (11/16 0749) BP: (127-169)/(39-93) 137/68 mmHg (11/16 0749) SpO2:  [80 %-98 %] 95 % (11/16 0804) FiO2 (%):  [50 %-100 %] 65 % (11/16 0804) Weight:  [158 lb 11.2 oz (71.986 kg)] 158 lb 11.2 oz (71.986 kg) (11/16 0434)  Intake/Output from previous day: 11/15 0701 - 11/16 0700 In: 615.6 [I.V.:117.6; IV Piggyback:498] Out: 1375 [Urine:1375]  Physical Exam: NAD HEENT: normal Neck: supple Lungs: bibasilar rales CV: RRR without murmur or gallop Abd: soft, NT, Positive BS, no hepatomegaly Ext: no C/C/E, distal pulses intact and equal Skin: warm/dry no rash  Lab Results:  Recent Labs  09/13/15 2145 09/14/15 0610  WBC 16.2* 16.4*  HGB 8.4* 8.6*  PLT 164 166    Recent Labs  09/13/15 0625 09/14/15 0610  NA 141 145  K 4.3 4.2  CL 104 104  CO2 23 23  GLUCOSE 145* 147*  BUN 149* 166*  CREATININE 5.69* 6.01*    Recent Labs  09/12/15 0235 09/13/15 1115  TROPONINI 18.51*  11.65*    Cardiac Studies: Echo 09/11/2015: LV EF 65-70%, mild LVH, grade 1 diastolic dysfunction, PA peak pressure 65 mmHg, trivial pericardial effusion  Echo 09/12/2015: Normal systolic function, normal wall motion, no regional wall motion abnormalities.  Tele: NSR, nonsustained SVT, PVC   Assessment/Plan:  81 year old man with history of DM2, HTN, HLD, PVD, CAD, CKD stage 4, carotid artery disease, chronic anemia presenting with dyspnea x 2 days.   NSTEMI, CAD: Troponin 6.15 on admission up to 18.51. Downtrending. No wall motion abnormalities and LV EF 65-70% on echo 11/13. Repeat EKG with sinus rhythm, mild depression in II, aVF unchanged from previous EKG. Limited echo with normal systolic function and no regional wall motion abnormalities. -No urgent indication for cath at this moment. Potential cath pending decision on HD. -Heparin gtt and follow hgb closely. -Metoprolol 5mg  IV q6hr.  -Atorvastatin 80mg  daily. -Plavix 75mg  daily  Acute diastolic CHF: BNP 2200 on admission. CVP 12 this morning. 1.4L urine output and overall positive 500 ml since admission. Weight down to 158 from 160. -Lasix 160mg  q6hr, metolazone 10mg  daily  CAP, septic shock, acute hypoxic respiratory failure: CXR on admission with bilateral mid to lower lung zone airspace opacities. Intubated on 11/13. Lactic acidosis on admission to 6.69  which has resolved. -Rocephin and azithromycin per PCCM  Acute on chronic anemia, GI bleed: Hgb 7.5 on admission and received 1u pRBC on 11/13. Hgb 8.3 post transfusion. Hgb down to 6.7 overnight and received 1 u pRBC. Hgb stable at 8.6 this morning. -PPI -GI following, further workup pending. -Follow hgb closely while on heparin gtt.  Acute on CKD stage 4: Nephrologist is Dr. Darrick Penna. Baseline creatinine around 4. Creatinine on admission 3.82. Up to 6.01 today from 5.69 yesterday.  -Nephrology following -Lasix  q6hr, metolazone  daily  DM2: -SSI  Griffin Basil, M.D. 09/14/2015, 8:12 AM  The patient was seen, examined and discussed with the IM resident Griffin Basil, M.D. and I agree with the above.   Mr Kettles is a 81 year old male with h/o CKD stage 4, HTN admitted with acute hypoxic respiratory failure requiring intubation, found to have elevated lactic acid, leukocytosis possibly due to pneumonia and severe anemia suspicious for GIB. He is also in acute diastolic CHF and elevated troponin that was originally believed to be demand ischemia, however significant uptrending of troponin 6-->11-->18 that qualifies for NSTEMI. The patient denies chest pain. He was started on iv Heparin yesterday and dropped Hb from 7.5 --> 6.7, received 1 unit of PRBC --> 8.6.  He self extubated himself and is currently doing well on BiPAP.  His Crea continues deteriorating, he made 300 cc of urine a 2 days ago, 1300 cc yesterday.  Nephrology suggesting HD, I talked to his wife that it is a good option even if short term, if he decides to stay on HD we would consider left cardiac cath.  Lars Masson 09/13/2015

## 2015-09-14 NOTE — Plan of Care (Signed)
Met with patients daughter at bedside. Patient now intubated and on CRRT. Prognosis very poor given patient's condition prior to admission. We discussed the "big picture" and explored best and worst case scenarios. She is struggling to make decisions in general and tells me she hears different messages/and or senses hesitation from providers in terms of his prognosis and recommendations. She is certain that he would not want to be kept in this condition long term. Additionally if he has a cardiac arrest or acute decline she would not want him to have CPR or undergo any further invasive procedures. Maintain current level of intervention. Full consult note to follow- I have discussed with Dr. Justin Mend and if he has not improved in the next 4-5 days we should recommend a transition to comfort care. Palliative following.  Lane Hacker, DO Palliative Medicine

## 2015-09-15 ENCOUNTER — Inpatient Hospital Stay (HOSPITAL_COMMUNITY): Payer: Medicare Other

## 2015-09-15 DIAGNOSIS — I5041 Acute combined systolic (congestive) and diastolic (congestive) heart failure: Secondary | ICD-10-CM

## 2015-09-15 DIAGNOSIS — R195 Other fecal abnormalities: Secondary | ICD-10-CM

## 2015-09-15 DIAGNOSIS — I5031 Acute diastolic (congestive) heart failure: Secondary | ICD-10-CM

## 2015-09-15 DIAGNOSIS — D500 Iron deficiency anemia secondary to blood loss (chronic): Secondary | ICD-10-CM

## 2015-09-15 LAB — CBC WITH DIFFERENTIAL/PLATELET
BASOS PCT: 0 %
Basophils Absolute: 0 10*3/uL (ref 0.0–0.1)
Basophils Absolute: 0 10*3/uL (ref 0.0–0.1)
Basophils Relative: 0 %
EOS ABS: 0 10*3/uL (ref 0.0–0.7)
EOS PCT: 0 %
Eosinophils Absolute: 0.1 10*3/uL (ref 0.0–0.7)
Eosinophils Relative: 0 %
HCT: 26.4 % — ABNORMAL LOW (ref 39.0–52.0)
HEMATOCRIT: 27.1 % — AB (ref 39.0–52.0)
HEMOGLOBIN: 8.9 g/dL — AB (ref 13.0–17.0)
Hemoglobin: 8.9 g/dL — ABNORMAL LOW (ref 13.0–17.0)
LYMPHS ABS: 0.6 10*3/uL — AB (ref 0.7–4.0)
LYMPHS ABS: 1.6 10*3/uL (ref 0.7–4.0)
LYMPHS PCT: 4 %
Lymphocytes Relative: 11 %
MCH: 30.9 pg (ref 26.0–34.0)
MCH: 29.9 pg (ref 26.0–34.0)
MCHC: 33.7 g/dL (ref 30.0–36.0)
MCHC: 32.8 g/dL (ref 30.0–36.0)
MCV: 91.7 fL (ref 78.0–100.0)
MCV: 90.9 fL (ref 78.0–100.0)
MONO ABS: 1.9 10*3/uL — AB (ref 0.1–1.0)
MONOS PCT: 13 %
MONOS PCT: 7 %
Monocytes Absolute: 1 10*3/uL (ref 0.1–1.0)
NEUTROS ABS: 12 10*3/uL — AB (ref 1.7–7.7)
NEUTROS PCT: 82 %
Neutro Abs: 12.4 10*3/uL — ABNORMAL HIGH (ref 1.7–7.7)
Neutrophils Relative %: 83 %
PLATELETS: 160 10*3/uL (ref 150–400)
Platelets: 153 10*3/uL (ref 150–400)
RBC: 2.88 MIL/uL — ABNORMAL LOW (ref 4.22–5.81)
RBC: 2.98 MIL/uL — ABNORMAL LOW (ref 4.22–5.81)
RDW: 17.1 % — AB (ref 11.5–15.5)
RDW: 16.8 % — ABNORMAL HIGH (ref 11.5–15.5)
WBC: 14.9 10*3/uL — ABNORMAL HIGH (ref 4.0–10.5)
WBC: 14.7 10*3/uL — ABNORMAL HIGH (ref 4.0–10.5)

## 2015-09-15 LAB — RENAL FUNCTION PANEL
ALBUMIN: 2.5 g/dL — AB (ref 3.5–5.0)
ANION GAP: 10 (ref 5–15)
Albumin: 2.4 g/dL — ABNORMAL LOW (ref 3.5–5.0)
Albumin: 2.4 g/dL — ABNORMAL LOW (ref 3.5–5.0)
Anion gap: 12 (ref 5–15)
Anion gap: 13 (ref 5–15)
BUN: 91 mg/dL — AB (ref 6–20)
BUN: 91 mg/dL — AB (ref 6–20)
BUN: 60 mg/dL — ABNORMAL HIGH (ref 6–20)
CALCIUM: 8.6 mg/dL — AB (ref 8.9–10.3)
CHLORIDE: 105 mmol/L (ref 101–111)
CHLORIDE: 105 mmol/L (ref 101–111)
CHLORIDE: 104 mmol/L (ref 101–111)
CO2: 25 mmol/L (ref 22–32)
CO2: 26 mmol/L (ref 22–32)
CO2: 27 mmol/L (ref 22–32)
CREATININE: 2.91 mg/dL — AB (ref 0.61–1.24)
CREATININE: 2.93 mg/dL — AB (ref 0.61–1.24)
Calcium: 8.5 mg/dL — ABNORMAL LOW (ref 8.9–10.3)
Calcium: 8.5 mg/dL — ABNORMAL LOW (ref 8.9–10.3)
Creatinine, Ser: 2.28 mg/dL — ABNORMAL HIGH (ref 0.61–1.24)
GFR calc Af Amer: 22 mL/min — ABNORMAL LOW (ref >60)
GFR calc Af Amer: 29 mL/min — ABNORMAL LOW (ref >60)
GFR calc non Af Amer: 19 mL/min — ABNORMAL LOW (ref >60)
GFR calc non Af Amer: 19 mL/min — ABNORMAL LOW (ref >60)
GFR calc non Af Amer: 25 mL/min — ABNORMAL LOW (ref >60)
GFR, EST AFRICAN AMERICAN: 21 mL/min — AB (ref >60)
GLUCOSE: 95 mg/dL (ref 65–99)
GLUCOSE: 95 mg/dL (ref 65–99)
Glucose, Bld: 94 mg/dL (ref 65–99)
PHOSPHORUS: 4.3 mg/dL (ref 2.5–4.6)
POTASSIUM: 4.5 mmol/L (ref 3.5–5.1)
Phosphorus: 4.7 mg/dL — ABNORMAL HIGH (ref 2.5–4.6)
Phosphorus: 4.7 mg/dL — ABNORMAL HIGH (ref 2.5–4.6)
Potassium: 4.1 mmol/L (ref 3.5–5.1)
Potassium: 4.1 mmol/L (ref 3.5–5.1)
SODIUM: 143 mmol/L (ref 135–145)
Sodium: 143 mmol/L (ref 135–145)
Sodium: 141 mmol/L (ref 135–145)

## 2015-09-15 LAB — POCT ACTIVATED CLOTTING TIME
ACTIVATED CLOTTING TIME: 122 s
ACTIVATED CLOTTING TIME: 128 s
ACTIVATED CLOTTING TIME: 147 s
ACTIVATED CLOTTING TIME: 171 s
ACTIVATED CLOTTING TIME: 171 s
Activated Clotting Time: 140 seconds
Activated Clotting Time: 159 seconds
Activated Clotting Time: 165 seconds
Activated Clotting Time: 171 seconds

## 2015-09-15 LAB — GLUCOSE, CAPILLARY
GLUCOSE-CAPILLARY: 92 mg/dL (ref 65–99)
GLUCOSE-CAPILLARY: 113 mg/dL — AB (ref 65–99)
Glucose-Capillary: 87 mg/dL (ref 65–99)
Glucose-Capillary: 87 mg/dL (ref 65–99)
Glucose-Capillary: 149 mg/dL — ABNORMAL HIGH (ref 65–99)

## 2015-09-15 LAB — PROCALCITONIN: PROCALCITONIN: 3.04 ng/mL

## 2015-09-15 LAB — MAGNESIUM: Magnesium: 2.7 mg/dL — ABNORMAL HIGH (ref 1.7–2.4)

## 2015-09-15 MED ORDER — HEPARIN SODIUM (PORCINE) 5000 UNIT/ML IJ SOLN: 5000.0000 [IU] | Freq: Three times a day (TID) | INTRAMUSCULAR | Status: DC

## 2015-09-15 MED ORDER — ASPIRIN 81 MG PO CHEW
81.0000 mg | CHEWABLE_TABLET | Freq: Every day | ORAL | Status: DC
  Administered 2015-09-16 – 2015-09-18 (×3): 81 mg
  Filled 2015-09-15 (×3): qty 1

## 2015-09-15 MED ORDER — HEPARIN BOLUS VIA INFUSION (CRRT)
1000.0000 [IU] | INTRAVENOUS | Status: DC | PRN
  Filled 2015-09-15: qty 1000

## 2015-09-15 MED ORDER — HEPARIN SODIUM (PORCINE) 1000 UNIT/ML DIALYSIS
1000.0000 [IU] | INTRAMUSCULAR | Status: DC | PRN
  Filled 2015-09-15: qty 6

## 2015-09-15 MED ORDER — VITAL AF 1.2 CAL PO LIQD
1000.0000 mL | ORAL | Status: DC
  Administered 2015-09-15 – 2015-09-17 (×3): 1000 mL
  Filled 2015-09-15 (×6): qty 1000

## 2015-09-15 MED ORDER — PIVOT 1.5 CAL PO LIQD
1000.0000 mL | ORAL | Status: DC
  Filled 2015-09-15 (×2): qty 1000

## 2015-09-15 MED ORDER — SODIUM CHLORIDE 0.9 % IJ SOLN
250.0000 [IU]/h | INTRAMUSCULAR | Status: DC
  Administered 2015-09-15: 1250 [IU]/h via INTRAVENOUS_CENTRAL
  Administered 2015-09-15: 250 [IU]/h via INTRAVENOUS_CENTRAL
  Administered 2015-09-16: 2050 [IU]/h via INTRAVENOUS_CENTRAL
  Administered 2015-09-16: 1750 [IU]/h via INTRAVENOUS_CENTRAL
  Administered 2015-09-16: 1850 [IU]/h via INTRAVENOUS_CENTRAL
  Administered 2015-09-16: 2050 [IU]/h via INTRAVENOUS_CENTRAL
  Administered 2015-09-17: 1750 [IU]/h via INTRAVENOUS_CENTRAL
  Filled 2015-09-15 (×8): qty 2

## 2015-09-15 NOTE — Progress Notes (Signed)
CRRT filter clotted and when changing it I got a Code 24 12V/24V. Called the help line and the machine has to be taken to biomed for service.

## 2015-09-15 NOTE — Progress Notes (Signed)
PULMONARY / CRITICAL CARE MEDICINE   Name: [name] MRN: [id number] DOB: 12/21/1929    ADMISSION DATE:  09/11/2015 CONSULTATION DATE:  11/13  REFERRING MD :  Butler Denmarkizwan   CHIEF COMPLAINT:  Acute Respiratory Failure  INITIAL PRESENTATION: 81 y/o male w/ stage IV CKD , HTN and CAD. Presents to the ED on 11/13 w/ acute resp failure in setting of acute pulmonary edema, hypertensive crisis and NSTEMI +/- CAP CCM asked to assess given degree of respiratory failure and ongoing ischemia w/ concern the pt would require intubation.   STUDIES:  ECHO 11/13 - Systolic function wasvigorous. The estimated ejection fraction was in the range of 65% to 70%. Doppler parameters are consistent with abnormal leftventricular relaxation (grade 1 diastolic dysfunction). - Left atrium: The atrium was mildly dilated. - Pulmonary arteries: PA peak pressure: 65 mm Hg (S). Port CXR 11/13 - ETT & CVC in good position. Bilateral lower lung opacities. Port CXR 11/17 - Bilateral patchy opacities. R IJ catheter & Subclav CVC in good position. ETT in good position.  SIGNIFICANT EVENTS: 11/13 - Admit  11/13 - Intubation  11/15 - Self-extubation 11/16 - Reintubation & HD catheter placement for CVVHD  HISTORY OF PRESENT ILLNESS:   81 year old male w/sig h/o hypertension, diabetes, symptomatic bradycardia, peripheral vascular disease, stage IV chronic kidney disease, anemia of chronic kidney disease on Procrit, and dyslipidemia. [facility]: 2 days of shortness of breath.  He's not had any cough or chest pain he's had subjective chills but no fevers. He reported some increase in lower extremity edema. Because of significant shortness of breath he presented to the ER today the EMS. Upon EMS arrival to the home patient had increased work of breathing with extremely labored effort and use of accessory muscles. On exam he was found to have bilateral coarse crackles. Pulse oximetry on room air was  80%. Patient received 2 DuoNeb's without any improvement in symptoms. BiPAP was initiated by EMS. In ED CXR showed R>L airspace disease. Admitted to the IM service w/ working dx of acute resp failure in setting of pulmonary edema in setting of NSTEMI +/- PNA. PCCM asked to see re: need for intubation given AMI.   SUBJECTIVE: Patient successfully undergoing CVVHD after placement of temporary hemodialysis catheter yesterday. Patient electively intubated for worsening acute hypoxic respiratory failure yesterday. Palliative care did speak with the patient's daughter and she has agreed to a DO NOT RESUSCITATE status.  REVIEW OF SYSTEMS:  Unable to obtain due to intubation and sedation.  VITAL SIGNS: Temp:  [97.4 F (36.3 C)-98.3 F (36.8 C)] 97.6 F (36.4 C) (11/17 0730) Pulse Rate:  [71-89] 81 (11/17 0724) Resp:  [9-28] 9 (11/17 0800) BP: (116-155)/(35-99) 141/99 mmHg (11/17 0800) SpO2:  [93 %-100 %] 99 % (11/17 0800) FiO2 (%):  [50 %-65 %] 50 % (11/17 0724) Weight:  [149 lb 8 oz (67.813 kg)] 149 lb 8 oz (67.813 kg) (11/17 0402) HEMODYNAMICS: CVP:  [5 mmHg-9 mmHg] 9 mmHg VENTILATOR SETTINGS: Vent Mode:  [-] CPAP;PSV FiO2 (%):  [50 %-65 %] 50 % Set Rate:  [16 bmp] 16 bmp Vt Set:  [510 mL] 510 mL PEEP:  [5 cmH20-8 cmH20] 8 cmH20 Pressure Support:  [15 cmH20] 15 cmH20 Plateau Pressure:  [17 cmH20-19 cmH20] 19 cmH20 INTAKE / OUTPUT:  Intake/Output Summary (Last 24 hours) at 09/15/15 1001 Last data filed at 09/15/15 0900  Gross per 24 hour  Intake    319 ml  Output  3950 ml  Net  -3631 ml    PHYSICAL EXAMINATION: General:  Laying with eyes closed. Sedate. Currently on CVVHD and ventilator. Integument:  Warm & dry. No rash on exposed skin. Right subclavian central venous catheter & internal jugular HD catheter in place. HEENT: Endotracheal tube in place. No scleral icterus or injection. Cardiovascular:  Regular rate. No appreciable JVD.  Pulmonary:  Coarse breath sounds bilaterally  on ventilator. Symmetric chest rise on ventilator in bases. Abdomen: Soft. Normal bowel sounds. Nondistended.  Neurological: Patient opens eyes to voice. Slightly sedate. Grossly nonfocal and nods to questions.  LABS:  CBC  Recent Labs Lab 09/14/15 1430 09/14/15 2220 09/15/15 0400  WBC 13.4* 14.7* 14.9*  HGB 8.1* 8.5* 8.9*  HCT 23.9* 26.0* 26.4*  PLT 140* 156 160   Coag's  Recent Labs Lab 09/11/15 0955 09/11/15 1450 09/14/15 1115  APTT  --  31 27  INR 1.25 1.35  --    BMET  Recent Labs Lab 09/14/15 0610 09/14/15 1610 09/15/15 0400  NA 145 145 143  143  K 4.2 3.8 4.1  4.1  CL 104 105 105  105  CO2 BUN 166* 151* 91*  91*  CREATININE 6.01* 4.71* 2.91*  2.93*  GLUCOSE 147* 130* 94  95   Electrolytes  Recent Labs Lab 09/13/15 0625 09/14/15 0610 09/14/15 1610 09/15/15 0400  CALCIUM 8.4* 8.9 8.5* 8.6*  8.5*  MG 2.8* 2.9*  --  2.7*  PHOS 7.5* 7.7* 6.6* 4.7*  4.7*   Sepsis Markers  Recent Labs Lab 09/11/15 2020 09/12/15 0235 09/12/15 0945 09/13/15 0625 09/15/15 0400  LATICACIDVEN 2.3*  --  1.1 0.9  --   PROCALCITON  --  8.57  --  8.84 3.04   ABG  Recent Labs Lab 09/11/15 1712 09/14/15 1452 09/14/15 2350  PHART 7.319* 7.401 7.401  PCO2ART 57.1* 39.3 42.3  PO2ART 302.0* 60.0* 47.0*   Liver Enzymes  Recent Labs Lab 09/11/15 0955 09/11/15 1450  09/14/15 0610 09/14/15 1610 09/15/15 0400  AST 73* 71*  --   --   --   --   ALT 22 23  --   --   --   --   ALKPHOS 82 84  --   --   --   --   BILITOT 1.3* 1.0  --   --   --   --   ALBUMIN 3.1* 3.0*  < > 2.5* 2.4* 2.4*  2.4*  < > = values in this interval not displayed. Cardiac Enzymes  Recent Labs Lab 09/11/15 2020 09/12/15 0235 09/13/15 1115  TROPONINI 11.37* 18.51* 11.65*   Glucose  Recent Labs Lab 09/14/15 1212 09/14/15 1639 09/14/15 1950 09/14/15 2338 09/15/15 0357 09/15/15 0754  GLUCAP 167* 112* 97 98 87 87    Imaging Portable Chest  Xray  09/15/2015  CLINICAL DATA:  Respiratory failure, shortness of breath. EXAM: PORTABLE CHEST 1 VIEW COMPARISON:  September 14, 2015. FINDINGS: Stable cardiomediastinal silhouette. Endotracheal tube is in grossly good position and unchanged compared to prior exam. Right internal jugular and subclavian catheters are unchanged in position. No pneumothorax or significant pleural effusion is noted. Stable bilateral lung opacities are noted concerning for edema or pneumonia. Bony thorax is unremarkable. IMPRESSION: Stable support apparatus. Stable bilateral lung opacities as described above. Electronically Signed   By: Lupita Raider, M.D.   On: 09/15/2015 07:22   Dg Chest Portable 1 View  09/14/2015  CLINICAL DATA:  Dialysis catheter  and endotracheal tube placement EXAM: PORTABLE CHEST 1 VIEW COMPARISON:  09/12/2015 FINDINGS: Endotracheal to and right-sided venous line are unchanged. Interval reduction of a RIGHT internal jugular catheter tip in distal SVC. No pneumothorax. There is diffuse bilateral fine airspace disease.  No consolidation. IMPRESSION: 1. Placement of RIGHT dialysis catheter without pneumothorax. 2. Endotracheal tube in good position. 3. Diffuse airspace disease consistent with pulmonary edema. No change. Electronically Signed   By: Genevive Bi M.D.   On: 09/14/2015 13:54   Dg Abd Portable 1v  09/14/2015  CLINICAL DATA:  Orogastric tube advancement. Initial encounter. For lab EXAM: PORTABLE ABDOMEN - 1 VIEW COMPARISON:  Abdominal radiograph performed earlier today at 9:37 p.m. FINDINGS: The patient's endotracheal tube has been advanced, now seen ending just above the carina. This should be retracted 2-3 cm. The enteric tube is noted ending at the distal esophagus. This should be advanced at least 9 cm. The visualized bowel gas pattern is difficult to fully assess due to motion artifact. Diffuse bilateral airspace opacification is again noted. No acute osseous abnormalities are seen.  IMPRESSION: 1. Endotracheal tube has been advanced, now seen ending just above the carina. This should be retracted 2-3 cm. 2. Enteric tube noted ending at the distal esophagus. This should be advanced at least 9 cm. 3. Diffuse bilateral airspace opacification again noted. These results were called by telephone at the time of interpretation on 09/14/2015 at 11:16 pm to [facility], who verbally acknowledged these results. Electronically Signed   By: Roanna Raider M.D.   On: 09/14/2015 23:17   Dg Abd Portable 1v  09/14/2015  CLINICAL DATA:  Orogastric tube placement.  Initial encounter. EXAM: PORTABLE ABDOMEN - 1 VIEW COMPARISON:  Abdominal radiograph performed 09/11/2015 FINDINGS: The patient's enteric tube is noted ending overlying the gastroesophageal junction, with the side port at the distal esophagus. This could be advanced at least 6 cm. Diffuse bilateral airspace opacification may reflect pulmonary edema or pneumonia. ARDS cannot be excluded. The patient's endotracheal tube is seen ending 3 cm above the carina. A right IJ line is noted ending about the mid SVC. The visualized bowel gas pattern is grossly unremarkable. No acute osseous abnormalities are seen. IMPRESSION: 1. Enteric tube noted ending overlying the gastroesophageal junction, with the side port at the distal esophagus. This could be advanced at least 6 cm. 2. Diffuse bilateral airspace opacification may reflect pulmonary edema or pneumonia. ARDS cannot be excluded. Electronically Signed   By: Roanna Raider M.D.   On: 09/14/2015 21:53    ASSESSMENT / PLAN:  PULMONARY OETT 11/13 - 11/15 (self extubation); 11/16>>> A: Acute Hypoxic respiratory Failure - Pulmonary edema vs Severe CAP. Appears to be more relative to pulmonary edema at this time. Electively intubated yesterday. Pulmonary Edema - Decompensated Diastolic CHF Possible Severe CAP - PCT elevated & but culture negative.  P:   Electively intubated yesterday   Weaning FiO2 to 0.5 for Sat >94% Duoneb q6hr See ID section  Diuresis with HD  CARDIOVASCULAR R Subclavian CVC 11/13>>> R IJ HD Catheter 11/16>>> A:  NSTEMI vs Demand Ischemia - H/O CAD.  Acute Decompensation of Diastolic CHF Hypertensive Crisis - Resolved.  H/O PVD & Carotid Artery Disease  P:  Cardiology following Heparin drip resuming Transfuse for goal hgb > 8 CVP from CVL Monitor on telemetry Diuresis with HD Lopressor  IV q6hr Hydralazine  tid Plavix daily ASA  qhs Lipitor  qhs Clonidine 0.1mg  tid  RENAL A:   Acute on  Chronic Renal Failure - Worsening but UOP improving. Now on CVVHD. Lactic acidosis - Resolved. Hypokalemia - Resolved.  P:   Nephrology following Patient now on CVVHD Plan for diuresis with HD Strict I&O Monitoring UOP with Foley Monitoring electrolytes daily Continue Terazosin  GASTROINTESTINAL A:   Heme + stools - No obvious GI blood loss.  P:   NPO for now Placing NGT for tube feedings Nutrition consult for tube feeding recommendations GI consulted but patient too unstable Protonix 40mg  IV q12hr  HEMATOLOGIC A:   Anemia of chronic disease - Hgb improving. S/P transfusion 1u PRBC 11/13 & 1u PRBC on 11/14. Heme + stools - No hematochezia or melena. Leukocytosis - Stable. Multifactorial in etiology.  P:  Trending Hgb w/ CBC q12hr while on heparin gtt. Trending Leukcytosis w/ CBC Restarting Heparin gtt  INFECTIOUS A:   Possible Sepsis Possible Severe CAP - Resp ctx negative. PCT improving.  P:   Procalcitonin Algorithm  Ctx: Trach Asp 11/14 - Oral Flora Blood x2 11/13>>> Urine Strep 11/13 - Negative Urine Legionella 11/13 - Negative Influenza PCR 11/14 - Negative  Abx: Azithromycin 11/13>>> Rocephin 11/13>>>  ENDOCRINE A:   H/O DM - BG controlled  P:   Accu-Checks q4hr Low dose SSI Protocol  NEUROLOGIC A:   Sedation needs on ventilator No acute issues  P:   Goal RASS:  0 to -1 on  ventilator Fentanyl & Versed IV prn  FAMILY  - Updates:  Daughter at bedside to update this morning by me. - Inter-disciplinary family meet or Palliative Care meeting due by:  11/20  TODAY'S SUMMARY: Patient tolerating diuresis with CVVHD. Starting heparin drip. Chest x-ray seems to be consistent with pulmonary edema. Daughter confirms at bedside DO NOT RESUSCITATE status. Prognosis remains guarded to poor.  I have spent a total of 31 minutes of critical care time today caring for this patient, updating the patient's daughter at bedside, & reviewing the patient's electronic medical record.  Donna Christen Jamison Neighbor, M.D. [facility] Pulmonary & Critical Care Pager:  [phone] After 3pm or if no response, call [phone]  09/15/2015, 10:01 AM

## 2015-09-15 NOTE — Progress Notes (Signed)
[facility] ROUNDING NOTE   Subjective:   Interval History: intubated and sedated -- comfortable night.  Continues on CRRT   Objective:  Vital signs in last 24 hours:  Temp:  [97.4 F (36.3 C)-98.3 F (36.8 C)] 97.6 F (36.4 C) (11/17 0730) Pulse Rate:  [71-89] 81 (11/17 0724) Resp:  [9-28] 9 (11/17 0800) BP: (116-155)/(35-99) 141/99 mmHg (11/17 0800) SpO2:  [93 %-100 %] 99 % (11/17 0800) FiO2 (%):  [50 %-65 %] 50 % (11/17 0724) Weight:  [67.813 kg (149 lb 8 oz)] 67.813 kg (149 lb 8 oz) (11/17 0402)  Weight change: -4.173 kg (-9 lb 3.2 oz) Filed Weights   09/13/15 0429 09/14/15 0434 09/15/15 0402  Weight: 72.576 kg (160 lb) 71.986 kg (158 lb 11.2 oz) 67.813 kg (149 lb 8 oz)    Intake/Output: I/O last 3 completed shifts: In: 807 [I.V.:210; Other:99; IV Piggyback:498] Out: 5100 [Urine:2975; Other:2125]   Intake/Output this shift:  Total I/O In: 20 [I.V.:20] Out: 530 [Urine:45; Other:485]  GEN: lying in bed, NAD HEENT: normal NECK: supple LUNGS: anterior lung fields clear, CPAP in place CARDIAC: RRR without murmur ABD: soft, non-tender, +BS, distended somewhat EXT: no edema, pulses intact NEURO: non-focal Skin: bruising, warm, and dry, intact   Basic Metabolic Panel:  Recent Labs Lab 09/11/15 0955  09/12/15 0235 09/12/15 0940 09/13/15 0625 09/14/15 0610 09/14/15 1610 09/15/15 0400  NA 142  < > 144  --  141 145 145 143  143  K 3.1*  < > 3.7  --  4.3 4.2 3.8 4.1  4.1  CL 102  < > 105  --  104 104 105 105  105  CO2 21*  < > 28  --  23 23 26 26  25   GLUCOSE 247*  < > 130*  --  145* 147* 130* 94  95  BUN 102*  < > 114*  --  149* 166* 151* 91*  91*  CREATININE 3.82*  < > 4.38*  --  5.69* 6.01* 4.71* 2.91*  2.93*  CALCIUM 8.9  < > 8.6*  --  8.4* 8.9 8.5* 8.6*  8.5*  MG 2.6*  --   --  2.7* 2.8* 2.9*  --  2.7*  PHOS  --   --   --   --  7.5* 7.7* 6.6* 4.7*  4.7*  < > = values in this interval not displayed.  Liver Function  Tests:  Recent Labs Lab 09/11/15 0955 09/11/15 1450 09/13/15 0625 09/14/15 0610 09/14/15 1610 09/15/15 0400  AST 73* 71*  --   --   --   --   ALT 22 23  --   --   --   --   ALKPHOS 82 84  --   --   --   --   BILITOT 1.3* 1.0  --   --   --   --   PROT 5.8* 6.4*  --   --   --   --   ALBUMIN 3.1* 3.0* 2.5* 2.5* 2.4* 2.4*  2.4*   No results for input(s): LIPASE, AMYLASE in the last 168 hours. No results for input(s): AMMONIA in the last 168 hours.  CBC:  Recent Labs Lab 09/11/15 0955  09/11/15 1450  09/13/15 0625  09/13/15 2145 09/14/15 0610 09/14/15 1430 09/14/15 2220 09/15/15 0400  WBC 21.3*  --  19.3*  < > 16.3*  < > 16.2* 16.4* 13.4* 14.7* 14.9*  NEUTROABS 19.0*  --  18.5*  --  14.9*  --   --  14.6*  --   --  12.4*  HGB 7.5*  < > 8.3*  < > 8.1*  < > 8.4* 8.6* 8.1* 8.5* 8.9*  HCT 22.6*  < > 25.0*  < > 24.0*  < > 25.5* 25.4* 23.9* 26.0* 26.4*  MCV 94.2  --  91.6  < > 92.7  < > 92.1 91.4 90.9 91.5 91.7  PLT 216  --  195  < > 154  < > 164 166 140* 156 160  < > = values in this interval not displayed.  Cardiac Enzymes:  Recent Labs Lab 09/11/15 1450 09/11/15 2020 09/12/15 0235 09/13/15 1115  TROPONINI 6.15* 11.37* 18.51* 11.65*    BNP: Invalid input(s): POCBNP  CBG:  Recent Labs Lab 09/14/15 1639 09/14/15 1950 09/14/15 2338 09/15/15 0357 09/15/15 0754  GLUCAP 112* 97 98 87 87    Microbiology: Results for orders placed or performed during the hospital encounter of 09/11/15  Blood culture (routine x 2)     Status: None (Preliminary result)   Collection Time: 09/11/15  9:55 AM  Result Value Ref Range Status   Specimen Description BLOOD RIGHT ARM  Final   Special Requests BOTTLES DRAWN AEROBIC AND ANAEROBIC  Final   Culture NO GROWTH 3 DAYS  Final   Report Status PENDING  Incomplete  Blood culture (routine x 2)     Status: None (Preliminary result)   Collection Time: 09/11/15 10:55 AM  Result Value Ref Range Status   Specimen Description BLOOD  RIGHT HAND  Final   Special Requests BOTTLES DRAWN AEROBIC AND ANAEROBIC  Final   Culture NO GROWTH 3 DAYS  Final   Report Status PENDING  Incomplete  MRSA PCR Screening     Status: None   Collection Time: 09/11/15  2:20 PM  Result Value Ref Range Status   MRSA by PCR NEGATIVE NEGATIVE Final    Comment:        The GeneXpert MRSA Assay (FDA approved for NASAL specimens only), is one component of a comprehensive MRSA colonization surveillance program. It is not intended to diagnose MRSA infection nor to guide or monitor treatment for MRSA infections.   Culture, respiratory (NON-Expectorated)     Status: None   Collection Time: 09/12/15  4:52 AM  Result Value Ref Range Status   Specimen Description TRACHEAL ASPIRATE  Final   Special Requests NONE  Final   Gram Stain   Final    ABUNDANT WBC PRESENT,BOTH PMN AND MONONUCLEAR RARE SQUAMOUS EPITHELIAL CELLS PRESENT NO ORGANISMS SEEN Performed at [facility]    Culture   Final    RARE YEAST CONSISTENT WITH CANDIDA SPECIES Performed at [facility]    Report Status 09/14/2015 FINAL  Final    Coagulation Studies: No results for input(s): LABPROT, INR in the last 72 hours.  Urinalysis: No results for input(s): COLORURINE, LABSPEC, PHURINE, GLUCOSEU, HGBUR, BILIRUBINUR, KETONESUR, PROTEINUR, UROBILINOGEN, NITRITE, LEUKOCYTESUR in the last 72 hours.  Invalid input(s): APPERANCEUR    Imaging: Dg Abd 1 View  09/15/2015  CLINICAL DATA:  Feeding tube placement. EXAM: ABDOMEN - 1 VIEW COMPARISON:  09/14/2015 FINDINGS: Patient is rotated to the right. Nasogastric tube courses into the stomach and is looped over the right upper quadrant and courses back up into the distal esophagus with tip over the distal esophagus. Nonobstructive bowel gas pattern. Stable multifocal airspace process within the visualized lungs. Degenerative change of the spine. Calcified plaque over the  abdominal aorta. IMPRESSION: Nonobstructive  bowel gas pattern. Nasogastric tube is coiled within the stomach over the right upper quadrant and courses back into the distal esophagus with tip over the distal esophagus. Note that the Care team is aware of this finding as subsequent images have already been obtained with tube repositioning. Electronically Signed   By: Elberta Fortis M.D.   On: 09/15/2015 10:15   Portable Chest Xray  09/15/2015  CLINICAL DATA:  Respiratory failure, shortness of breath. EXAM: PORTABLE CHEST 1 VIEW COMPARISON:  September 14, 2015. FINDINGS: Stable cardiomediastinal silhouette. Endotracheal tube is in grossly good position and unchanged compared to prior exam. Right internal jugular and subclavian catheters are unchanged in position. No pneumothorax or significant pleural effusion is noted. Stable bilateral lung opacities are noted concerning for edema or pneumonia. Bony thorax is unremarkable. IMPRESSION: Stable support apparatus. Stable bilateral lung opacities as described above. Electronically Signed   By: Lupita Raider, M.D.   On: 09/15/2015 07:22   Dg Chest Portable 1 View  09/14/2015  CLINICAL DATA:  Dialysis catheter and endotracheal tube placement EXAM: PORTABLE CHEST 1 VIEW COMPARISON:  09/12/2015 FINDINGS: Endotracheal to and right-sided venous line are unchanged. Interval reduction of a RIGHT internal jugular catheter tip in distal SVC. No pneumothorax. There is diffuse bilateral fine airspace disease.  No consolidation. IMPRESSION: 1. Placement of RIGHT dialysis catheter without pneumothorax. 2. Endotracheal tube in good position. 3. Diffuse airspace disease consistent with pulmonary edema. No change. Electronically Signed   By: Genevive Bi M.D.   On: 09/14/2015 13:54   Dg Abd Portable 1v  09/14/2015  CLINICAL DATA:  Orogastric tube advancement. Initial encounter. For lab EXAM: PORTABLE ABDOMEN - 1 VIEW COMPARISON:  Abdominal radiograph performed earlier today at 9:37 p.m. FINDINGS: The patient's  endotracheal tube has been advanced, now seen ending just above the carina. This should be retracted 2-3 cm. The enteric tube is noted ending at the distal esophagus. This should be advanced at least 9 cm. The visualized bowel gas pattern is difficult to fully assess due to motion artifact. Diffuse bilateral airspace opacification is again noted. No acute osseous abnormalities are seen. IMPRESSION: 1. Endotracheal tube has been advanced, now seen ending just above the carina. This should be retracted 2-3 cm. 2. Enteric tube noted ending at the distal esophagus. This should be advanced at least 9 cm. 3. Diffuse bilateral airspace opacification again noted. These results were called by telephone at the time of interpretation on 09/14/2015 at 11:16 pm to [facility] on [facility], who verbally acknowledged these results. Electronically Signed   By: Roanna Raider M.D.   On: 09/14/2015 23:17   Dg Abd Portable 1v  09/14/2015  CLINICAL DATA:  Orogastric tube placement.  Initial encounter. EXAM: PORTABLE ABDOMEN - 1 VIEW COMPARISON:  Abdominal radiograph performed 09/11/2015 FINDINGS: The patient's enteric tube is noted ending overlying the gastroesophageal junction, with the side port at the distal esophagus. This could be advanced at least 6 cm. Diffuse bilateral airspace opacification may reflect pulmonary edema or pneumonia. ARDS cannot be excluded. The patient's endotracheal tube is seen ending 3 cm above the carina. A right IJ line is noted ending about the mid SVC. The visualized bowel gas pattern is grossly unremarkable. No acute osseous abnormalities are seen. IMPRESSION: 1. Enteric tube noted ending overlying the gastroesophageal junction, with the side port at the distal esophagus. This could be advanced at least 6 cm. 2. Diffuse bilateral airspace opacification may reflect pulmonary edema or pneumonia.  ARDS cannot be excluded. Electronically Signed   By: Roanna RaiderJeffery  Chang M.D.   On: 09/14/2015 21:53      Medications:   . sodium chloride 10 mL/hr at 09/14/15 2000  . dialysis replacement fluid (prismasate) 200 mL/hr at 09/14/15 1410  . dialysis replacement fluid (prismasate) 300 mL/hr at 09/15/15 0855  . dialysate (PRISMASATE) 2,000 mL/hr at 09/15/15 1005   . antiseptic oral rinse  7 mL Mouth Rinse Q2H  . aspirin  81 mg Per Tube Daily  . atorvastatin  80 mg Per Tube q1800  . azithromycin  500 mg Intravenous Q24H  . cefTRIAXone (ROCEPHIN)  IV  1 g Intravenous Q24H  . chlorhexidine gluconate  15 mL Mouth Rinse BID  . cloNIDine  0.1 mg Per Tube TID  . clopidogrel  75 mg Per Tube Daily  . hydrALAZINE  10 mg Intravenous Q6H  . insulin aspart  0-9 Units Subcutaneous Q4H  . ipratropium-albuterol  3 mL Nebulization Q6H  . metolazone  10 mg Oral Daily  . metoprolol  10 mg Intravenous Q4H  . pantoprazole (PROTONIX) IV  40 mg Intravenous Q12H  . terazosin  10 mg Per Tube QHS   acetaminophen, fentaNYL (SUBLIMAZE) injection, heparin, midazolam, nitroGLYCERIN, ondansetron (ZOFRAN) IV, sodium chloride  Assessment/ Plan:  1. AKI in CKD4: Nephrologist is Dr. Darrick Pennaeterding. Baseline creatinine around 4. On admission Cr was at baseline but has continued to worsen. He is now oliguric. He has been given Lasix and metolazone without much UOP. Believe this is ATN with progression of renal disease.  -Scr was worsening and CRRT started 11/16 -monitor I/Os and daily weights -trend labs - CRRT initiated and removal of 1.9 L ultrafiltrate -avoid nephrotoxic agents and contrast  - -palliative consult to helps with goals of care  2. Dyspnea: patient was admitted to ICU with acute hypoxic respiratory failure due to pulmonary edema +/- CAP. He has decompensated diastolic CHF. Lactic acidosis on admission to 6.69 which has resolved. -Pulmonology following -on rocephin and azithromycin   3. NSTEMI: Troponins continued to rise on admission qualifying for NSTEMI.  -cardiology following Considering Right  heart catheterization -holding off on cath in setting of possible HD -On heparin  4. Aneima: of chronic disease along with acute blood loss anemia from GI bleed(positive stool occult). S/p 1 u PRBCs.  -on PPI -GI following; unstable for procedure  Discussed condition withDeb ( daughter ) Very little progress although condition is stable    LOS: 4 [name] @TODAY @10 :19 AM

## 2015-09-15 NOTE — Progress Notes (Addendum)
Initial Nutrition Assessment  DOCUMENTATION CODES:   Severe malnutrition in context of chronic illness  INTERVENTION:  Vital 1.2 @ Goal Rate of 61m Provides: 1600 calories, 100g protein, 1083mfree h2o Pt is Refeed Risk, begin @ 2063mr advance by 10Q10   NUTRITION DIAGNOSIS:   Malnutrition related to poor appetite as evidenced by moderate depletion of body fat, severe depletion of muscle mass, percent weight loss.  GOAL:   Patient will meet greater than or equal to 90% of their needs  MONITOR:   Diet advancement, Labs, Vent status, I & O's, Skin  REASON FOR ASSESSMENT:   Consult Enteral/tube feeding initiation and management  ASSESSMENT:   [age] male past medical history of hypertension, diabetes followed by Dr. KumDwyane Dee an outpatient, prior history of a symptom met a bradycardia, peripheral vascular disease, stage IV chronic kidney disease, anemia of chronic kidney disease on Procrit, and dyslipidemia. Patient reported to the ER regarding 2 days of shortness of breath. Admitted with possible sepsis secondary to pneumonia. ARF, NSTEMI.  RD consulted for tubefeed initiation  Spoke with patient's daughter at bedside. Revealed pt was eating regularly prior to admission. Has not had any nutrition since 11/12.  Pt is currently on vent, sedated PRN with versed Appears Severely malnourished based on 13#/8% severe wt loss in 1.5 months, and Severe muscle depletion, moderate fat depletion.  Pt required K repletion upon admit  Currently, BUN 91, Cr 2.93, EGFR 19, Phos 4.7, Mg 2.7, K WNL, BNP 2200, Troponin 11.65  Pt is possible refeeding risk, will begin trickle feeds at 30m56m advance 10Q10 Diet Order:  Diet NPO time specified  Skin:  Reviewed, no issues  Last BM:  09/11/2015  Height:   Ht Readings from Last 1 Encounters:  09/11/15 5' 6"  (1.676 m)    Weight:   Wt Readings from Last 1 Encounters:  09/15/15 149 lb 8 oz (67.813 kg)    Ideal Body Weight:  70  kg  BMI:  Body mass index is 24.14 kg/(m^2).  Estimated Nutritional Needs:   Kcal:  1660 calories  Protein:  80-100 grams  Fluid:  1L  EDUCATION NEEDS:   No education needs identified at this time  WillSatira Anisrd, MS, RD LDN After Hours/Weekend Pager [phone]

## 2015-09-15 NOTE — Progress Notes (Signed)
Subjective:  Intubated, sedated  . antiseptic oral rinse  7 mL Mouth Rinse Q2H  . atorvastatin  80 mg Per Tube q1800  . azithromycin  500 mg Intravenous Q24H  . cefTRIAXone (ROCEPHIN)  IV  1 g Intravenous Q24H  . chlorhexidine gluconate  15 mL Mouth Rinse BID  . cloNIDine  0.1 mg Per Tube TID  . clopidogrel  75 mg Per Tube Daily  . hydrALAZINE  10 mg Intravenous Q6H  . insulin aspart  0-9 Units Subcutaneous Q4H  . ipratropium-albuterol  3 mL Nebulization Q6H  . metolazone  10 mg Oral Daily  . metoprolol  10 mg Intravenous Q4H  . pantoprazole (PROTONIX) IV  40 mg Intravenous Q12H  . terazosin  10 mg Per Tube QHS   . sodium chloride 10 mL/hr at 09/14/15 2000  . nitroGLYCERIN Stopped (09/12/15 0325)  . dialysis replacement fluid (prismasate) 200 mL/hr at 09/14/15 1410  . dialysis replacement fluid (prismasate) 300 mL/hr at 09/14/15 1411  . dialysate (PRISMASATE) 2,000 mL/hr at 09/15/15 0454    Objective:  Vital Signs in the last 24 hours: Temp:  [97.1 F (36.2 C)-98.3 F (36.8 C)] 97.4 F (36.3 C) (11/17 0400) Pulse Rate:  [71-91] 71 (11/17 0425) Resp:  [9-28] 9 (11/17 0600) BP: (116-168)/(35-90) 129/40 mmHg (11/17 0600) SpO2:  [93 %-100 %] 100 % (11/17 0600) FiO2 (%):  [50 %-65 %] 50 % (11/17 0425) Weight:  [149 lb 8 oz (67.813 kg)] 149 lb 8 oz (67.813 kg) (11/17 0402)  Intake/Output from previous day: 11/16 0701 - 11/17 0700 In: 675 [I.V.:210; IV Piggyback:366] Out: 4050 [Urine:1925]  Physical Exam: NAD HEENT: normal Neck: supple Lungs: bibasilar rales CV: RRR without murmur or gallop Abd: soft, NT, Positive BS, no hepatomegaly Ext: no C/C/E, distal pulses intact and equal Skin: warm/dry no rash  Lab Results:  Recent Labs  09/14/15 2220 09/15/15 0400  WBC 14.7* 14.9*  HGB 8.5* 8.9*  PLT 156 160    Recent Labs  09/14/15 1610 09/15/15 0400  NA 145 143  143  K 3.8 4.1  4.1  CL 105 105  105  CO2 GLUCOSE 130* 94  95  BUN 151*  91*  91*  CREATININE 4.71* 2.91*  2.93*    Recent Labs  09/13/15 1115  TROPONINI 11.65*    Cardiac Studies: Echo 09/11/2015: LV EF 65-70%, mild LVH, grade 1 diastolic dysfunction, PA peak pressure 65 mmHg, trivial pericardial effusion  Echo 09/12/2015: Normal systolic function, normal wall motion, no regional wall motion abnormalities.  Tele: NSR, nonsustained SVT, PVC   Assessment/Plan:  81 year old man with history of DM2, HTN, HLD, PVD, CAD, CKD stage 4, carotid artery disease, chronic anemia presenting with dyspnea x 2 days.   NSTEMI, CAD: Troponin 6.15 on admission up to 18.51. Downtrending. No wall motion abnormalities and LV EF 65-70% on echo 11/13. Repeat EKG with sinus rhythm, mild depression in II, aVF unchanged from previous EKG. Limited echo with normal systolic function and no regional wall motion abnormalities. Heparin gtt discontinued 11/15 as his troponin was downtrending/MI resolving. -Pt's family would not want him to undergo any further invasive procedures per palliative care. -ASA  daily -Metoprolol  IV q4hr.  -Atorvastatin  daily. -Plavix  daily  Acute diastolic CHF: BNP 2200 on admission. CVP 9 this morning. Initiated on CRRT 11/16. 1.9L urine output, 1.9L removed with CRRT and and overall -3L since admission. Weight down to 149 from 158. -metolazone  daily -  CRRT per renal  CAP, septic shock, acute hypoxic respiratory failure: CXR on admission with bilateral mid to lower lung zone airspace opacities. Intubated on 11/13 and self extubated 11/15. Lactic acidosis on admission to 6.69 which has resolved. Reintubated 11/16 for worsening respiratory status. -Rocephin and azithromycin per PCCM  Acute on chronic anemia, GI bleed: Hgb 7.5 on admission and received 1u pRBC on 11/13. Hgb 8.3 post transfusion. Hgb down to 6.7 11/15 and received 1 u pRBC. Hgb stable at 8.9 this morning. -PPI -GI following, further workup pending.  Acute on CKD  stage 4: Nephrologist is Dr. Darrick Pennaeterding. Baseline creatinine around 4. Creatinine on admission 3.82. Started on CRRT 11/16 due to volume overload in the setting of oliguria and worsening renal function. -Nephrology following -CRRT  DM2: -SSI  Griffin BasilKrall, Jennifer, M.D. 09/15/2015, 7:25 AM   The patient was seen, examined and discussed with the IM resident Griffin BasilKrall, Jennifer, M.D. and I agree with the above.   [name] is a 81 year old male with h/o CKD stage 4, HTN admitted with acute hypoxic respiratory failure requiring intubation, found to have elevated lactic acid, leukocytosis possibly due to pneumonia and severe anemia suspicious for GIB. He is also in acute diastolic CHF and elevated troponin that was originally believed to be demand ischemia, however significant uptrending of troponin 6-->11-->18 that qualifies for NSTEMI. The patient denied chest pain. He was on iv Heparin and dropped Hb from 7.5 --> 6.7, received 1 unit of PRBC --> 8.6, now off Heparin.  He self extubated himself 2 days ago but required reintubation. His Crea continued deteriorating so he was started on CRRT yesyerday, removed 1.9 liters so far. I have had discussion with his daughter, she is clear that at this point he doesn't want any invasive procedures like cardiac cath, they are unsure about long term HD and want to think about it. He father was very independent and active until a day prior to this hospitalization. We will provide few days of CRRT, if he remains anuric a decision will need to be made if he is going to be palliative care or HD is an option (considering we are able to extubate him). The daughter understands and will let us know about her decision.  Lars MassonELSON, Cesareo Vickrey H 09/15/2015

## 2015-09-15 NOTE — Progress Notes (Signed)
Attempted OG tube X2. Auscultated placement both times. Pt keeps pushing OG out with his tongue and I found it coiled in his mouth twice. E-Link MD notified and will continue to monitor.

## 2015-09-15 NOTE — Progress Notes (Signed)
ELINK contact for PaO2: 49/Sat 89%. Dr. Dema SeverinMungal ordered Peep of 8.

## 2015-09-15 NOTE — Progress Notes (Signed)
ETT pulled back to 21 ATL following XRAY.

## 2015-09-16 LAB — CBC WITH DIFFERENTIAL/PLATELET
BASOS PCT: 0 %
Basophils Absolute: 0 10*3/uL (ref 0.0–0.1)
Basophils Absolute: 0 10*3/uL (ref 0.0–0.1)
Basophils Relative: 0 %
EOS ABS: 0.1 10*3/uL (ref 0.0–0.7)
EOS ABS: 0.2 10*3/uL (ref 0.0–0.7)
EOS PCT: 2 %
Eosinophils Relative: 1 %
HCT: 25.3 % — ABNORMAL LOW (ref 39.0–52.0)
HCT: 25.9 % — ABNORMAL LOW (ref 39.0–52.0)
HEMOGLOBIN: 8.2 g/dL — AB (ref 13.0–17.0)
Hemoglobin: 8.5 g/dL — ABNORMAL LOW (ref 13.0–17.0)
LYMPHS ABS: 0.7 10*3/uL (ref 0.7–4.0)
LYMPHS PCT: 7 %
Lymphocytes Relative: 9 %
Lymphs Abs: 1 10*3/uL (ref 0.7–4.0)
MCH: 29.8 pg (ref 26.0–34.0)
MCH: 29.9 pg (ref 26.0–34.0)
MCHC: 32.4 g/dL (ref 30.0–36.0)
MCHC: 32.8 g/dL (ref 30.0–36.0)
MCV: 92 fL (ref 78.0–100.0)
MCV: 91.2 fL (ref 78.0–100.0)
MONO ABS: 1.3 10*3/uL — AB (ref 0.1–1.0)
MONOS PCT: 11 %
Monocytes Absolute: 1.3 10*3/uL — ABNORMAL HIGH (ref 0.1–1.0)
Monocytes Relative: 12 %
NEUTROS ABS: 8.5 10*3/uL — AB (ref 1.7–7.7)
NEUTROS PCT: 80 %
NEUTROS PCT: 78 %
Neutro Abs: 8.7 10*3/uL — ABNORMAL HIGH (ref 1.7–7.7)
PLATELETS: 132 10*3/uL — AB (ref 150–400)
Platelets: 136 10*3/uL — ABNORMAL LOW (ref 150–400)
RBC: 2.75 MIL/uL — AB (ref 4.22–5.81)
RBC: 2.84 MIL/uL — ABNORMAL LOW (ref 4.22–5.81)
RDW: 16.7 % — ABNORMAL HIGH (ref 11.5–15.5)
RDW: 16.6 % — AB (ref 11.5–15.5)
WBC: 10.6 10*3/uL — AB (ref 4.0–10.5)
WBC: 11.1 10*3/uL — ABNORMAL HIGH (ref 4.0–10.5)

## 2015-09-16 LAB — GLUCOSE, CAPILLARY
GLUCOSE-CAPILLARY: 150 mg/dL — AB (ref 65–99)
GLUCOSE-CAPILLARY: 127 mg/dL — AB (ref 65–99)
GLUCOSE-CAPILLARY: 121 mg/dL — AB (ref 65–99)
GLUCOSE-CAPILLARY: 118 mg/dL — AB (ref 65–99)
Glucose-Capillary: 121 mg/dL — ABNORMAL HIGH (ref 65–99)
Glucose-Capillary: 122 mg/dL — ABNORMAL HIGH (ref 65–99)

## 2015-09-16 LAB — POCT ACTIVATED CLOTTING TIME
ACTIVATED CLOTTING TIME: 171 s
ACTIVATED CLOTTING TIME: 171 s
ACTIVATED CLOTTING TIME: 171 s
ACTIVATED CLOTTING TIME: 177 s
ACTIVATED CLOTTING TIME: 177 s
ACTIVATED CLOTTING TIME: 183 s
ACTIVATED CLOTTING TIME: 183 s
ACTIVATED CLOTTING TIME: 184 s
ACTIVATED CLOTTING TIME: 202 s
ACTIVATED CLOTTING TIME: 202 s
ACTIVATED CLOTTING TIME: 214 s
ACTIVATED CLOTTING TIME: 214 s
ACTIVATED CLOTTING TIME: 233 s
Activated Clotting Time: 183 seconds
Activated Clotting Time: 177 seconds
Activated Clotting Time: 177 seconds
Activated Clotting Time: 196 seconds
Activated Clotting Time: 214 seconds
Activated Clotting Time: 220 seconds
Activated Clotting Time: 233 seconds
Activated Clotting Time: 226 seconds

## 2015-09-16 LAB — RENAL FUNCTION PANEL
ALBUMIN: 2.1 g/dL — AB (ref 3.5–5.0)
Albumin: 2.3 g/dL — ABNORMAL LOW (ref 3.5–5.0)
Anion gap: 9 (ref 5–15)
Anion gap: 7 (ref 5–15)
BUN: 48 mg/dL — AB (ref 6–20)
BUN: 32 mg/dL — AB (ref 6–20)
CALCIUM: 8 mg/dL — AB (ref 8.9–10.3)
CO2: 27 mmol/L (ref 22–32)
CO2: 27 mmol/L (ref 22–32)
CREATININE: 1.53 mg/dL — AB (ref 0.61–1.24)
Calcium: 7.8 mg/dL — ABNORMAL LOW (ref 8.9–10.3)
Chloride: 102 mmol/L (ref 101–111)
Chloride: 103 mmol/L (ref 101–111)
Creatinine, Ser: 2.09 mg/dL — ABNORMAL HIGH (ref 0.61–1.24)
GFR calc Af Amer: 32 mL/min — ABNORMAL LOW (ref >60)
GFR calc non Af Amer: 41 mL/min — ABNORMAL LOW (ref >60)
GFR, EST AFRICAN AMERICAN: 47 mL/min — AB (ref >60)
GFR, EST NON AFRICAN AMERICAN: 28 mL/min — AB (ref >60)
GLUCOSE: 133 mg/dL — AB (ref 65–99)
Glucose, Bld: 147 mg/dL — ABNORMAL HIGH (ref 65–99)
PHOSPHORUS: 4.1 mg/dL (ref 2.5–4.6)
POTASSIUM: 4.2 mmol/L (ref 3.5–5.1)
Phosphorus: 2.2 mg/dL — ABNORMAL LOW (ref 2.5–4.6)
Potassium: 4.3 mmol/L (ref 3.5–5.1)
SODIUM: 137 mmol/L (ref 135–145)
Sodium: 138 mmol/L (ref 135–145)

## 2015-09-16 LAB — CULTURE, BLOOD (ROUTINE X 2)
Culture: NO GROWTH
Culture: NO GROWTH

## 2015-09-16 LAB — APTT: aPTT: 152 seconds — ABNORMAL HIGH (ref 24–37)

## 2015-09-16 LAB — MAGNESIUM: MAGNESIUM: 2.6 mg/dL — AB (ref 1.7–2.4)

## 2015-09-16 MED ORDER — SENNA 8.6 MG PO TABS: 2.0000 | ORAL_TABLET | Freq: Every evening | ORAL | Status: DC | PRN

## 2015-09-16 MED ORDER — SODIUM CHLORIDE 0.9 % IV SOLN
10.0000 ug/h | INTRAVENOUS | Status: DC
  Administered 2015-09-16: 50 ug/h via INTRAVENOUS
  Administered 2015-09-17: 75 ug/h via INTRAVENOUS
  Filled 2015-09-16 (×2): qty 50

## 2015-09-16 MED ORDER — PANTOPRAZOLE SODIUM 40 MG PO PACK
40.0000 mg | PACK | Freq: Two times a day (BID) | ORAL | Status: DC
  Administered 2015-09-16 – 2015-09-18 (×4): 40 mg
  Filled 2015-09-16 (×4): qty 20

## 2015-09-16 MED ORDER — FENTANYL BOLUS VIA INFUSION
25.0000 ug | INTRAVENOUS | Status: DC | PRN
  Filled 2015-09-16: qty 25

## 2015-09-16 MED ORDER — ACETAMINOPHEN 160 MG/5ML PO SOLN
650.0000 mg | Freq: Four times a day (QID) | ORAL | Status: DC
  Administered 2015-09-16 – 2015-09-18 (×7): 650 mg via ORAL
  Filled 2015-09-16 (×7): qty 20.3

## 2015-09-16 MED ORDER — ACETAMINOPHEN 325 MG PO TABS: 650.0000 mg | ORAL_TABLET | Freq: Four times a day (QID) | ORAL | Status: DC

## 2015-09-16 MED ORDER — METOPROLOL TARTRATE 25 MG/10 ML ORAL SUSPENSION
12.5000 mg | Freq: Two times a day (BID) | ORAL | Status: DC
  Administered 2015-09-16 – 2015-09-18 (×4): 12.5 mg
  Filled 2015-09-16 (×4): qty 10

## 2015-09-16 MED ORDER — OLANZAPINE 5 MG PO TABS
5.0000 mg | ORAL_TABLET | Freq: Every day | ORAL | Status: DC
  Administered 2015-09-16 – 2015-09-17 (×2): 5 mg
  Filled 2015-09-16 (×3): qty 1

## 2015-09-16 NOTE — Progress Notes (Signed)
Pt was given sedation, low RR and low min vol alarm noted.  Placed pt back on full vent support previous settings per Dr Celene SkeenNester vent plans (see note earlier today) if pt needs to go back on full support.  RN aware and in room, pt tol vent settings presently, no distress noted.

## 2015-09-16 NOTE — Progress Notes (Signed)
Patient produced 10 mL dark maroon urine output.  [name] ELINK and spoke with MD [name]. MD camera'd in to patient's room and assessed urine output.  MD advised the color of the urine related to ATN/patient's kidney injury and to continue to monitor. [name], [name], RN

## 2015-09-16 NOTE — Progress Notes (Signed)
eLink Physician-Brief Progress Note Patient Name: [name] DOB: 10/16/1930 MRN: [id number]   Date of Service  09/16/2015  HPI/Events of Note  rn notified of 10 cc urine  eICU Interventions  i camera in  Looks brown not bloody Like ATN     Intervention Category Minor Interventions: Clinical assessment - ordering diagnostic tests  Nelda BucksFEINSTEIN,[name] 09/16/2015, 8:23 PM

## 2015-09-16 NOTE — Progress Notes (Signed)
Subjective:  Intubated, sedated  . antiseptic oral rinse  7 mL Mouth Rinse Q2H  . aspirin  81 mg Per Tube Daily  . atorvastatin  80 mg Per Tube q1800  . azithromycin  500 mg Intravenous Q24H  . cefTRIAXone (ROCEPHIN)  IV  1 g Intravenous Q24H  . chlorhexidine gluconate  15 mL Mouth Rinse BID  . cloNIDine  0.1 mg Per Tube TID  . clopidogrel  75 mg Per Tube Daily  . insulin aspart  0-9 Units Subcutaneous Q4H  . ipratropium-albuterol  3 mL Nebulization Q6H  . metoprolol tartrate  12.5 mg Per Tube BID  . pantoprazole sodium  40 mg Per Tube BID  . terazosin  10 mg Per Tube QHS   . sodium chloride 10 mL/hr at 09/16/15 1030  . feeding supplement (VITAL AF 1.2 CAL) 1,000 mL (09/16/15 0200)  . heparin 10,000 units/ 20 mL infusion syringe 2,050 Units/hr (09/16/15 1000)  . dialysis replacement fluid (prismasate) 200 mL/hr at 09/14/15 1410  . dialysis replacement fluid (prismasate) 300 mL/hr at 09/16/15 0234  . dialysate (PRISMASATE) 2,000 mL/hr at 09/16/15 1039    Objective:  Vital Signs in the last 24 hours: Temp:  [97.2 F (36.2 C)-97.5 F (36.4 C)] 97.5 F (36.4 C) (11/18 0700) Pulse Rate:  [43-76] 63 (11/18 1100) Resp:  [9-21] 12 (11/18 1100) BP: (84-151)/(33-68) 118/44 mmHg (11/18 1100) SpO2:  [100 %] 100 % (11/18 1100) FiO2 (%):  [40 %-50 %] 40 % (11/18 0745) Weight:  [142 lb 11.2 oz (64.728 kg)] 142 lb 11.2 oz (64.728 kg) (11/18 0500)  Intake/Output from previous day: 11/17 0701 - 11/18 0700 In: 1196 [I.V.:286; NG/GT:570; IV Piggyback:300] Out: 3780 [Urine:109]  Physical Exam: NAD HEENT: normal Neck: supple Lungs: bibasilar rales CV: RRR without murmur or gallop Abd: soft, NT, Positive BS, no hepatomegaly Ext: no C/C/E, distal pulses intact and equal Skin: warm/dry no rash  Lab Results:  Recent Labs  09/15/15 1459 09/16/15 0420  WBC 14.7* 10.6*  HGB 8.9* 8.2*  PLT 153 136*    Recent Labs  09/15/15 1459 09/16/15 0420  NA 141 138  K 4.5 4.2  CL  104 102  CO2 27 27  GLUCOSE 95 147*  BUN 60* 48*  CREATININE 2.28* 2.09*    Recent Labs  09/13/15 1115  TROPONINI 11.65*    Cardiac Studies: Echo 09/11/2015: LV EF 65-70%, mild LVH, grade 1 diastolic dysfunction, PA peak pressure 65 mmHg, trivial pericardial effusion  Echo 09/12/2015: Normal systolic function, normal wall motion, no regional wall motion abnormalities.  Tele: NSR, nonsustained SVT, PVC   Assessment/Plan:  81 year old man with history of DM2, HTN, HLD, PVD, CAD, CKD stage 4, carotid artery disease, chronic anemia presenting with dyspnea x 2 days.   NSTEMI, CAD: Troponin 6.15 on admission up to 18.51. Downtrending. No wall motion abnormalities and LV EF 65-70% on echo 11/13. Repeat EKG with sinus rhythm, mild depression in II, aVF unchanged from previous EKG. Limited echo with normal systolic function and no regional wall motion abnormalities. Heparin gtt discontinued 11/15 as his troponin was downtrending/MI resolving. -Pt's family would not want him to undergo any further invasive procedures per palliative care. -ASA 81mg  daily -Metoprolol 10mg  IV q4hr.  -Atorvastatin 80mg  daily. -Plavix 75mg  daily  Acute diastolic CHF: BNP 2200 on admission. CVP 9 this morning. Initiated on CRRT 11/16. 1.9L urine output, 1.9L removed with CRRT and and overall -3L since admission. Weight down to 149 from 158. -metolazone 10mg  daily -  CRRT per renal  CAP, septic shock, acute hypoxic respiratory failure: CXR on admission with bilateral mid to lower lung zone airspace opacities. Intubated on 11/13 and self extubated 11/15. Lactic acidosis on admission to 6.69 which has resolved. Reintubated 11/16 for worsening respiratory status. -Rocephin and azithromycin per PCCM  Acute on chronic anemia, GI bleed: Hgb 7.5 on admission and received 1u pRBC on 11/13. Hgb 8.3 post transfusion. Hgb down to 6.7 11/15 and received 1 u pRBC. Hgb stable at 8.9 this morning. -PPI -GI following, further  workup pending.  Acute on CKD stage 4: Nephrologist is Dr. Darrick Penna. Baseline creatinine around 4. Creatinine on admission 3.82. Started on CRRT 11/16 due to volume overload in the setting of oliguria and worsening renal function. -Nephrology following -CRRT  DM2: -SSI  Lars Masson, M.D. 09/16/2015, 11:08 AM   The patient was seen, examined and discussed with the IM resident Griffin Basil, M.D. and I agree with the above.   Mr Carpenter is a 81 year old male with h/o CKD stage 4, HTN admitted with acute hypoxic respiratory failure requiring intubation, found to have elevated lactic acid, leukocytosis possibly due to pneumonia and severe anemia suspicious for GIB. He is also in acute diastolic CHF and elevated troponin that was originally believed to be demand ischemia, however significant uptrending of troponin 6-->11-->18 that qualifies for NSTEMI. The patient denied chest pain. He was on iv Heparin and dropped Hb from 7.5 --> 6.7, received 1 unit of PRBC --> 8.6, now off Heparin.  He self extubated himself 2 days ago but required reintubation. His Crea continued deteriorating so he was started on CRRT yesyerday, removed 1.9 liters so far.  I have had discussion with his daughter again, she is clear that at this point he doesn't want any invasive procedures like cardiac cath. She talked to the nephrologist Dr Hyman Hopes this morning and is clear that she or the patient wouldn't want long term NH or HD. Dr Hyman Hopes is suggesting that they continue HD till Sunday night - Monday morning and if tere is no improvement in the patient's condition then withdraw care.  He was hypotensive this am, we will discontinue hydralazine.   Lars Masson 09/16/2015

## 2015-09-16 NOTE — Progress Notes (Signed)
Weaned PSV to 12 in effort to wean psv.  Pt appears to tol well so far, VSS.  RN aware.

## 2015-09-16 NOTE — Progress Notes (Signed)
Met with patient's daughter Suzi Roots, she wants to hold course until Sunday or Monday-she realizes this is not going well and that her father is not going to survive- she is holding out for a miricle. He is agitated this afternoon and I discussed while on the vent and receiving invasive care that we consider adding on scheduled pain and anxiety medication. Started low dose continuous fentanyl infusion along with PRN, bowel regimen, Tylenol scheduled per tube, and scheduled low dose xyprexa for his agitation. My team is available this weekend if a decision is made to transition to comfort care(please call [phone]) -otherwise I will plan of seeing him on Monday AM to address the high probably of needing terminal care.  Lane Hacker, DO Palliative Medicine

## 2015-09-16 NOTE — Progress Notes (Signed)
[facility] ROUNDING NOTE   Subjective:   Interval History:stable on Continuous renal replacement  -- very agitated   Objective:  Vital signs in last 24 hours:  Temp:  [97.2 F (36.2 C)-97.5 F (36.4 C)] 97.5 F (36.4 C) (11/18 0700) Pulse Rate:  [43-79] 79 (11/18 1133) Resp:  [9-21] 10 (11/18 1133) BP: (84-151)/(33-68) 118/44 mmHg (11/18 1100) SpO2:  [100 %] 100 % (11/18 1133) FiO2 (%):  [40 %] 40 % (11/18 1133) Weight:  [64.728 kg (142 lb 11.2 oz)] 64.728 kg (142 lb 11.2 oz) (11/18 0500)  Weight change: -3.084 kg (-6 lb 12.8 oz) Filed Weights   09/14/15 0434 09/15/15 0402 09/16/15 0500  Weight: 71.986 kg (158 lb 11.2 oz) 67.813 kg (149 lb 8 oz) 64.728 kg (142 lb 11.2 oz)    Intake/Output: I/O last 3 completed shifts: In: 1415 [I.V.:406; Other:139; NG/GT:570; IV Piggyback:300] Out: 5886 [Urine:534; Other:5352]   Intake/Output this shift:  Total I/O In: 513.5 [I.V.:33.5; NG/GT:180; IV Piggyback:300] Out: 852 [Urine:5; Other:847]  GEN: lying in bed, NAD HEENT: normal NECK: supple LUNGS: anterior lung fields clear, CPAP in place CARDIAC: RRR without murmur ABD: soft, non-tender, +BS, distended somewhat EXT: no edema, pulses intact NEURO: non-focal Skin: bruising, warm, and dry, intact   Basic Metabolic Panel:  Recent Labs Lab 09/12/15 0940  09/13/15 0625 09/14/15 0610 09/14/15 1610 09/15/15 0400 09/15/15 1459 09/16/15 0420  NA  --   --  141 145 145 143  143 141 138  K  --   --  4.3 4.2 3.8 4.1  4.1 4.5 4.2  CL  --   --  104 104 105 105  105 104 102  CO2  --   --  GLUCOSE  --   --  145* 147* 130* 94  95 95 147*  BUN  --   --  149* 166* 151* 91*  91* 60* 48*  CREATININE  --   --  5.69* 6.01* 4.71* 2.91*  2.93* 2.28* 2.09*  CALCIUM  --   --  8.4* 8.9 8.5* 8.6*  8.5* 8.5* 7.8*  MG 2.7*  --  2.8* 2.9*  --  2.7*  --  2.6*  PHOS  --   < > 7.5* 7.7* 6.6* 4.7*  4.7* 4.3 4.1  < > = values in this interval not  displayed.  Liver Function Tests:  Recent Labs Lab 09/11/15 0955 09/11/15 1450  09/14/15 0610 09/14/15 1610 09/15/15 0400 09/15/15 1459 09/16/15 0420  AST 73* 71*  --   --   --   --   --   --   ALT 22 23  --   --   --   --   --   --   ALKPHOS 82 84  --   --   --   --   --   --   BILITOT 1.3* 1.0  --   --   --   --   --   --   PROT 5.8* 6.4*  --   --   --   --   --   --   ALBUMIN 3.1* 3.0*  < > 2.5* 2.4* 2.4*  2.4* 2.5* 2.1*  < > = values in this interval not displayed. No results for input(s): LIPASE, AMYLASE in the last 168 hours. No results for input(s): AMMONIA in the last 168 hours.  CBC:  Recent Labs Lab 09/13/15 0625  09/14/15 0610  09/14/15 1430 09/14/15 2220 09/15/15 0400 09/15/15 1459 09/16/15 0420  WBC 16.3*  < > 16.4* 13.4* 14.7* 14.9* 14.7* 10.6*  NEUTROABS 14.9*  --  14.6*  --   --  12.4* 12.0* 8.5*  HGB 8.1*  < > 8.6* 8.1* 8.5* 8.9* 8.9* 8.2*  HCT 24.0*  < > 25.4* 23.9* 26.0* 26.4* 27.1* 25.3*  MCV 92.7  < > 91.4 90.9 91.5 91.7 90.9 92.0  PLT 154  < > 166 140* 156 160 153 136*  < > = values in this interval not displayed.  Cardiac Enzymes:  Recent Labs Lab 09/11/15 1450 09/11/15 2020 09/12/15 0235 09/13/15 1115  TROPONINI 6.15* 11.37* 18.51* 11.65*    BNP: Invalid input(s): POCBNP  CBG:  Recent Labs Lab 09/15/15 1504 09/15/15 2021 09/15/15 2356 09/16/15 0422 09/16/15 0736  GLUCAP 92 113* 121* 150* 127*    Microbiology: Results for orders placed or performed during the hospital encounter of 09/11/15  Blood culture (routine x 2)     Status: None   Collection Time: 09/11/15  9:55 AM  Result Value Ref Range Status   Specimen Description BLOOD RIGHT ARM  Final   Special Requests BOTTLES DRAWN AEROBIC AND ANAEROBIC  Final   Culture NO GROWTH 5 DAYS  Final   Report Status 09/16/2015 FINAL  Final  Blood culture (routine x 2)     Status: None   Collection Time: 09/11/15 10:55 AM  Result Value Ref Range Status   Specimen  Description BLOOD RIGHT HAND  Final   Special Requests BOTTLES DRAWN AEROBIC AND ANAEROBIC  Final   Culture NO GROWTH 5 DAYS  Final   Report Status 09/16/2015 FINAL  Final  MRSA PCR Screening     Status: None   Collection Time: 09/11/15  2:20 PM  Result Value Ref Range Status   MRSA by PCR NEGATIVE NEGATIVE Final    Comment:        The GeneXpert MRSA Assay (FDA approved for NASAL specimens only), is one component of a comprehensive MRSA colonization surveillance program. It is not intended to diagnose MRSA infection nor to guide or monitor treatment for MRSA infections.   Culture, respiratory (NON-Expectorated)     Status: None   Collection Time: 09/12/15  4:52 AM  Result Value Ref Range Status   Specimen Description TRACHEAL ASPIRATE  Final   Special Requests NONE  Final   Gram Stain   Final    ABUNDANT WBC PRESENT,BOTH PMN AND MONONUCLEAR RARE SQUAMOUS EPITHELIAL CELLS PRESENT NO ORGANISMS SEEN Performed at [facility]    Culture   Final    RARE YEAST CONSISTENT WITH CANDIDA SPECIES Performed at [facility]    Report Status 09/14/2015 FINAL  Final    Coagulation Studies: No results for input(s): LABPROT, INR in the last 72 hours.  Urinalysis: No results for input(s): COLORURINE, LABSPEC, PHURINE, GLUCOSEU, HGBUR, BILIRUBINUR, KETONESUR, PROTEINUR, UROBILINOGEN, NITRITE, LEUKOCYTESUR in the last 72 hours.  Invalid input(s): APPERANCEUR    Imaging: Dg Abd 1 View  09/15/2015  CLINICAL DATA:  Feeding tube placement. EXAM: ABDOMEN - 1 VIEW COMPARISON:  09/14/2015 and earlier 09/15/2015. FINDINGS: Nasogastric tube is present extending to the distal esophagus were turns 180 degrees and courses back up into the more proximal esophagus as tip is not visualized. Remainder of the exam is unchanged. IMPRESSION: Nasogastric tube courses to the distal esophagus, turns 180 degrees extending back up into the proximal esophagus as tip is not visualized. Care  team  aware of NG tube position as it has already been repositioned and awaiting imaging. Spoke with patient's nurse, Elissa Lovett, regarding the above findings. Electronically Signed   By: Elberta Fortis M.D.   On: 09/15/2015 10:20   Dg Abd 1 View  09/15/2015  CLINICAL DATA:  Feeding tube placement. EXAM: ABDOMEN - 1 VIEW COMPARISON:  09/14/2015 FINDINGS: Patient is rotated to the right. Nasogastric tube courses into the stomach and is looped over the right upper quadrant and courses back up into the distal esophagus with tip over the distal esophagus. Nonobstructive bowel gas pattern. Stable multifocal airspace process within the visualized lungs. Degenerative change of the spine. Calcified plaque over the abdominal aorta. IMPRESSION: Nonobstructive bowel gas pattern. Nasogastric tube is coiled within the stomach over the right upper quadrant and courses back into the distal esophagus with tip over the distal esophagus. Note that the Care team is aware of this finding as subsequent images have already been obtained with tube repositioning. Electronically Signed   By: Elberta Fortis M.D.   On: 09/15/2015 10:15   Portable Chest Xray  09/15/2015  CLINICAL DATA:  Respiratory failure, shortness of breath. EXAM: PORTABLE CHEST 1 VIEW COMPARISON:  September 14, 2015. FINDINGS: Stable cardiomediastinal silhouette. Endotracheal tube is in grossly good position and unchanged compared to prior exam. Right internal jugular and subclavian catheters are unchanged in position. No pneumothorax or significant pleural effusion is noted. Stable bilateral lung opacities are noted concerning for edema or pneumonia. Bony thorax is unremarkable. IMPRESSION: Stable support apparatus. Stable bilateral lung opacities as described above. Electronically Signed   By: Lupita Raider, M.D.   On: 09/15/2015 07:22   Dg Chest Portable 1 View  09/14/2015  CLINICAL DATA:  Dialysis catheter and endotracheal tube placement EXAM: PORTABLE  CHEST 1 VIEW COMPARISON:  09/12/2015 FINDINGS: Endotracheal to and right-sided venous line are unchanged. Interval reduction of a RIGHT internal jugular catheter tip in distal SVC. No pneumothorax. There is diffuse bilateral fine airspace disease.  No consolidation. IMPRESSION: 1. Placement of RIGHT dialysis catheter without pneumothorax. 2. Endotracheal tube in good position. 3. Diffuse airspace disease consistent with pulmonary edema. No change. Electronically Signed   By: Genevive Bi M.D.   On: 09/14/2015 13:54   Dg Abd Portable 1v  09/15/2015  CLINICAL DATA:  NG tube placement EXAM: PORTABLE ABDOMEN - 1 VIEW COMPARISON:  09/15/2015 FINDINGS: Feeding tube tip in the proximal duodenum. Normal bowel gas pattern. IMPRESSION: Feeding tube tip in the proximal duodenum. Electronically Signed   By: Marlan Palau M.D.   On: 09/15/2015 13:30   Dg Abd Portable 1v  09/15/2015  CLINICAL DATA:  NG placement EXAM: PORTABLE ABDOMEN - 1 VIEW COMPARISON:  09/15/2015 FINDINGS: NG tube is coiled within the esophagus and does not enter the stomach. The NG tube is been pulled back slightly since earlier today. Colonic gas is unchanged. Diffuse bilateral lung infiltrates unchanged IMPRESSION: NG tube coiled in the esophagus Electronically Signed   By: Marlan Palau M.D.   On: 09/15/2015 10:31   Dg Abd Portable 1v  09/14/2015  CLINICAL DATA:  Orogastric tube advancement. Initial encounter. For lab EXAM: PORTABLE ABDOMEN - 1 VIEW COMPARISON:  Abdominal radiograph performed earlier today at 9:37 p.m. FINDINGS: The patient's endotracheal tube has been advanced, now seen ending just above the carina. This should be retracted 2-3 cm. The enteric tube is noted ending at the distal esophagus. This should be advanced at least 9 cm. The visualized bowel gas pattern  is difficult to fully assess due to motion artifact. Diffuse bilateral airspace opacification is again noted. No acute osseous abnormalities are seen. IMPRESSION:  1. Endotracheal tube has been advanced, now seen ending just above the carina. This should be retracted 2-3 cm. 2. Enteric tube noted ending at the distal esophagus. This should be advanced at least 9 cm. 3. Diffuse bilateral airspace opacification again noted. These results were called by telephone at the time of interpretation on 09/14/2015 at 11:16 pm to [facility], who verbally acknowledged these results. Electronically Signed   By: Roanna RaiderJeffery  Chang M.D.   On: 09/14/2015 23:17   Dg Abd Portable 1v  09/14/2015  CLINICAL DATA:  Orogastric tube placement.  Initial encounter. EXAM: PORTABLE ABDOMEN - 1 VIEW COMPARISON:  Abdominal radiograph performed 09/11/2015 FINDINGS: The patient's enteric tube is noted ending overlying the gastroesophageal junction, with the side port at the distal esophagus. This could be advanced at least 6 cm. Diffuse bilateral airspace opacification may reflect pulmonary edema or pneumonia. ARDS cannot be excluded. The patient's endotracheal tube is seen ending 3 cm above the carina. A right IJ line is noted ending about the mid SVC. The visualized bowel gas pattern is grossly unremarkable. No acute osseous abnormalities are seen. IMPRESSION: 1. Enteric tube noted ending overlying the gastroesophageal junction, with the side port at the distal esophagus. This could be advanced at least 6 cm. 2. Diffuse bilateral airspace opacification may reflect pulmonary edema or pneumonia. ARDS cannot be excluded. Electronically Signed   By: Roanna RaiderJeffery  Chang M.D.   On: 09/14/2015 21:53     Medications:   . sodium chloride 10 mL/hr at 09/16/15 1030  . feeding supplement (VITAL AF 1.2 CAL) 1,000 mL (09/16/15 0200)  . heparin 10,000 units/ 20 mL infusion syringe 2,050 Units/hr (09/16/15 1111)  . dialysis replacement fluid (prismasate) 200 mL/hr at 09/14/15 1410  . dialysis replacement fluid (prismasate) 300 mL/hr at 09/16/15 0234  . dialysate (PRISMASATE) 2,000 mL/hr at 09/16/15 1039    . antiseptic oral rinse  7 mL Mouth Rinse Q2H  . aspirin  81 mg Per Tube Daily  . atorvastatin  80 mg Per Tube q1800  . azithromycin  500 mg Intravenous Q24H  . cefTRIAXone (ROCEPHIN)  IV  1 g Intravenous Q24H  . chlorhexidine gluconate  15 mL Mouth Rinse BID  . cloNIDine  0.1 mg Per Tube TID  . clopidogrel  75 mg Per Tube Daily  . insulin aspart  0-9 Units Subcutaneous Q4H  . ipratropium-albuterol  3 mL Nebulization Q6H  . metoprolol tartrate  12.5 mg Per Tube BID  . pantoprazole sodium  40 mg Per Tube BID  . terazosin  10 mg Per Tube QHS   acetaminophen, fentaNYL (SUBLIMAZE) injection, heparin, heparin, heparin, midazolam, ondansetron (ZOFRAN) IV, sodium chloride  Assessment/ Plan:  1. AKI in CKD4: Nephrologist is Dr. Darrick Pennaeterding. Baseline creatinine around 4. On admission Cr was at baseline but has continued to worsen. He is now oliguric. He has been given Lasix and metolazone without much UOP. Believe this is ATN with progression of renal disease.  -Scr was worsening and CRRT started 11/16 --- continues   -monitor I/Os and daily weights -trend labs - CRRT initiated and removal of 4L L ultrafiltrate today  -avoid nephrotoxic agents and contrast  - -palliative consult to helps with goals of care  Discussed with daughter Reece LevyDeb ( she appears prepared and realistic )   2. Dyspnea: patient was admitted to ICU with acute hypoxic  respiratory failure due to pulmonary edema +/- CAP. He has decompensated diastolic CHF. Lactic acidosis on admission to 6.69 which has resolved. -Pulmonology following -on rocephin and azithromycin   3. NSTEMI: Troponins continued to rise on admission qualifying for NSTEMI.  -cardiology following Considering Right heart catheterization -holding off on cath in setting of possible HD - Heparin started with CRRT  4. Aneima: of chronic disease along with acute blood loss anemia from GI bleed(positive stool occult). S/p 1 u PRBCs.  -on PPI -GI following;  unstable for procedure  Discussed condition withDeb ( daughter ) Very little progress although condition is stable   LOS: 5 [name] W @TODAY @11 :33 AM

## 2015-09-16 NOTE — Progress Notes (Signed)
RN called d/t vent alarm low RR.  Noted rate approx 7-8 bpm, and spont VT 1400-1500.  Lowered psv to 8, VT now 1000 ml w/ RR 8-11 bpm.  RN aware.

## 2015-09-16 NOTE — Progress Notes (Signed)
PULMONARY / CRITICAL CARE MEDICINE   Name: [name] MRN: [id number] DOB: 09/03/1930    ADMISSION DATE:  09/11/2015 CONSULTATION DATE:  11/13  REFERRING MD :  Butler Denmarkizwan   CHIEF COMPLAINT:  Acute Respiratory Failure  INITIAL PRESENTATION: 81 y/o male w/ stage IV CKD , HTN and CAD. Presents to the ED on 11/13 w/ acute resp failure in setting of acute pulmonary edema, hypertensive crisis and NSTEMI +/- CAP CCM asked to assess given degree of respiratory failure and ongoing ischemia w/ concern the pt would require intubation.   STUDIES:  ECHO 11/13 - Systolic function wasvigorous. The estimated ejection fraction was in the range of 65% to 70%. Doppler parameters are consistent with abnormal leftventricular relaxation (grade 1 diastolic dysfunction). - Left atrium: The atrium was mildly dilated. - Pulmonary arteries: PA peak pressure: 65 mm Hg (S). Port CXR 11/13 - ETT & CVC in good position. Bilateral lower lung opacities. Port CXR 11/17 - Bilateral patchy opacities. R IJ catheter & Subclav CVC in good position. ETT in good position.  SIGNIFICANT EVENTS: 11/13 - Admit  11/13 - Intubation  11/15 - Self-extubation 11/16 - Reintubation & HD catheter placement for CVVHD  HISTORY OF PRESENT ILLNESS:   81 year old male w/sig h/o hypertension, diabetes, symptomatic bradycardia, peripheral vascular disease, stage IV chronic kidney disease, anemia of chronic kidney disease on Procrit, and dyslipidemia. [facility]: 2 days of shortness of breath.  He's not had any cough or chest pain he's had subjective chills but no fevers. He reported some increase in lower extremity edema. Because of significant shortness of breath he presented to the ER today the EMS. Upon EMS arrival to the home patient had increased work of breathing with extremely labored effort and use of accessory muscles. On exam he was found to have bilateral coarse crackles. Pulse oximetry on room air was  80%. Patient received 2 DuoNeb's without any improvement in symptoms. BiPAP was initiated by EMS. In ED CXR showed R>L airspace disease. Admitted to the IM service w/ working dx of acute resp failure in setting of pulmonary edema in setting of NSTEMI +/- PNA. PCCM asked to see re: need for intubation given AMI.   SUBJECTIVE: Tolerating CVVHD. Patient clotted off filter earlier this morning. No acute events overnight. Patient is having some borderline blood pressure with sedation.  REVIEW OF SYSTEMS:  Unable to obtain due to intubation and sedation.  VITAL SIGNS: Temp:  [97.2 F (36.2 C)-97.5 F (36.4 C)] 97.5 F (36.4 C) (11/18 0700) Pulse Rate:  [54-76] 54 (11/18 0930) Resp:  [9-21] 21 (11/18 0930) BP: (84-151)/(33-68) 120/56 mmHg (11/18 0945) SpO2:  [100 %] 100 % (11/18 0930) FiO2 (%):  [40 %-50 %] 40 % (11/18 0745) Weight:  [142 lb 11.2 oz (64.728 kg)] 142 lb 11.2 oz (64.728 kg) (11/18 0500) HEMODYNAMICS: CVP:  [5 mmHg-11 mmHg] 6 mmHg VENTILATOR SETTINGS: Vent Mode:  [-] CPAP;PSV FiO2 (%):  [40 %-50 %] 40 % PEEP:  [8 cmH20] 8 cmH20 Pressure Support:  [12 cmH20-15 cmH20] 12 cmH20 INTAKE / OUTPUT:  Intake/Output Summary (Last 24 hours) at 09/16/15 1007 Last data filed at 09/16/15 1000  Gross per 24 hour  Intake   1546 ml  Output   3483 ml  Net  -1937 ml    PHYSICAL EXAMINATION: General: Sedated. No distress. Currently on CVVHD and ventilator. Integument:  Warm & dry. Right subclavian central venous catheter & internal jugular HD catheter in place. HEENT: Endotracheal  tube in place. No scleral icterus or injection. Cardiovascular:  Regular rate. Normal S1 & S2. No appreciable JVD.  Pulmonary:  Coarse breath sounds bilaterally on ventilator. Symmetric chest rise on ventilator. Abdomen: Soft. Normal bowel sounds. Nondistended.  Neurological: Sedated. Pupils reactive.  LABS:  CBC  Recent Labs Lab 09/15/15 0400 09/15/15 1459 09/16/15 0420  WBC 14.9* 14.7* 10.6*  HGB  8.9* 8.9* 8.2*  HCT 26.4* 27.1* 25.3*  PLT 160 153 136*   Coag's  Recent Labs Lab 09/11/15 0955 09/11/15 1450 09/14/15 1115 09/16/15 0420  APTT  --  31 27 152*  INR 1.25 1.35  --   --    BMET  Recent Labs Lab 09/15/15 0400 09/15/15 1459 09/16/15 0420  NA 143  143 141 138  K 4.1  4.1 4.5 4.2  CL 105  105 104 102  CO2 BUN 91*  91* 60* 48*  CREATININE 2.91*  2.93* 2.28* 2.09*  GLUCOSE 94  95 95 147*   Electrolytes  Recent Labs Lab 09/14/15 0610  09/15/15 0400 09/15/15 1459 09/16/15 0420  CALCIUM 8.9  < > 8.6*  8.5* 8.5* 7.8*  MG 2.9*  --  2.7*  --  2.6*  PHOS 7.7*  < > 4.7*  4.7* 4.3 4.1  < > = values in this interval not displayed. Sepsis Markers  Recent Labs Lab 09/11/15 2020 09/12/15 0235 09/12/15 0945 09/13/15 0625 09/15/15 0400  LATICACIDVEN 2.3*  --  1.1 0.9  --   PROCALCITON  --  8.57  --  8.84 3.04   ABG  Recent Labs Lab 09/11/15 1712 09/14/15 1452 09/14/15 2350  PHART 7.319* 7.401 7.401  PCO2ART 57.1* 39.3 42.3  PO2ART 302.0* 60.0* 47.0*   Liver Enzymes  Recent Labs Lab 09/11/15 0955 09/11/15 1450  09/15/15 0400 09/15/15 1459 09/16/15 0420  AST 73* 71*  --   --   --   --   ALT 22 23  --   --   --   --   ALKPHOS 82 84  --   --   --   --   BILITOT 1.3* 1.0  --   --   --   --   ALBUMIN 3.1* 3.0*  < > 2.4*  2.4* 2.5* 2.1*  < > = values in this interval not displayed. Cardiac Enzymes  Recent Labs Lab 09/11/15 2020 09/12/15 0235 09/13/15 1115  TROPONINI 11.37* 18.51* 11.65*   Glucose  Recent Labs Lab 09/15/15 1202 09/15/15 1504 09/15/15 2021 09/15/15 2356 09/16/15 0422 09/16/15 0736  GLUCAP 149* 92 113* 121* 150* 127*    Imaging Dg Abd Portable 1v  09/15/2015  CLINICAL DATA:  NG tube placement EXAM: PORTABLE ABDOMEN - 1 VIEW COMPARISON:  09/15/2015 FINDINGS: Feeding tube tip in the proximal duodenum. Normal bowel gas pattern. IMPRESSION: Feeding tube tip in the proximal duodenum.  Electronically Signed   By: Marlan Palau M.D.   On: 09/15/2015 13:30   Dg Abd Portable 1v  09/15/2015  CLINICAL DATA:  NG placement EXAM: PORTABLE ABDOMEN - 1 VIEW COMPARISON:  09/15/2015 FINDINGS: NG tube is coiled within the esophagus and does not enter the stomach. The NG tube is been pulled back slightly since earlier today. Colonic gas is unchanged. Diffuse bilateral lung infiltrates unchanged IMPRESSION: NG tube coiled in the esophagus Electronically Signed   By: Marlan Palau M.D.   On: 09/15/2015 10:31    ASSESSMENT / PLAN:  PULMONARY OETT 11/13 - 11/15 (self  extubation); 11/16>>> A: Acute Hypoxic respiratory Failure - Pulmonary edema vs Severe CAP. Appears to be more relative to pulmonary edema at this time. Electively intubated yesterday. Pulmonary Edema - Decompensated Diastolic CHF Possible Severe CAP - PCT elevated & but culture negative.  P:   Full Vent support Plan for SBT tomorrow or Sunday Weaning FiO2 to 0.5 for Sat >94% Duoneb q6hr See ID section  Diuresis with HD  CARDIOVASCULAR R Subclavian CVC 11/13>>> R IJ HD Catheter 11/16>>> A:  NSTEMI vs Demand Ischemia - H/O CAD.  Acute Decompensation of Diastolic CHF Hypertensive Crisis - Resolved.  H/O PVD & Carotid Artery Disease  P:  Cardiology following Heparin per CVVHD Transfuse for goal hgb > 8 CVP from CVL Monitor on telemetry Diuresis with HD Lopressor 12.5 mg via tube bid D/C Hydralazine Plavix daily ASA  qhs Lipitor  qhs Clonidine 0.1mg  tid  RENAL A:   Acute on Chronic Renal Failure - UOP marginal on CVVHD. Lactic acidosis - Resolved. Hypokalemia - Resolved.  P:   Nephrology following CVVHD Plan for diuresis with CVVHD Strict I&O Monitoring UOP with Foley Monitoring electrolytes daily Continue Terazosin  GASTROINTESTINAL A:   Heme + stools - No obvious GI blood loss. No BM in days.  P:   NPO for now Placing NGT for tube feedings Tolearting Tube Feeds GI consulted  but patient too unstable at the time Protonix  IV q12hr  HEMATOLOGIC A:   Anemia of chronic disease - Hgb improving. S/P transfusion 1u PRBC 11/13 & 1u PRBC on 11/14. Heme + stools - No hematochezia or melena. Leukocytosis - Improving. Multifactorial in etiology.  P:  Trending Hgb w/ CBC q12hr while on Heparin Trending Leukcytosis w/ CBC Heparin per CVVHD  INFECTIOUS A:   Possible Sepsis - Resolving. Possible Severe CAP - Resp ctx negative. PCT improving.  P:   Plan to reculture for any fever.  Ctx: Trach Asp 11/14 - Oral Flora Blood x2 11/13>>> Urine Strep 11/13 - Negative Urine Legionella 11/13 - Negative Influenza PCR 11/14 - Negative  Abx: Azithromycin 11/13>>> Rocephin 11/13>>>  ENDOCRINE A:   H/O DM - BG well controlled.  P:   Accu-Checks q4hr Low dose SSI Protocol  NEUROLOGIC A:   Sedation needs on ventilator No acute issues  P:   Goal RASS:  0 to -1 on ventilator Fentanyl & Versed IV prn  FAMILY  - Updates:  No family at bedside this morning at the time of my rounds. - Inter-disciplinary family meet or Palliative Care meeting due by:  11/20  TODAY'S SUMMARY: Tolerating CVVHD. Blood pressure marginal with sedation. Transition to Lopressor via his tube & D/C hydralazine. May be able to undergo SBT tomorrow or Sunday depending on pulmonary status.  I have spent a total of 32 minutes of critical care time today caring for this patient & reviewing the patient's electronic medical record.  Donna Christen Jamison Neighbor, M.D. [facility] Pulmonary & Critical Care Pager:  [phone] After 3pm or if no response, call [phone]  09/16/2015, 10:07 AM

## 2015-09-16 NOTE — Progress Notes (Signed)
Subjective:  Intubated, sedated  . antiseptic oral rinse  7 mL Mouth Rinse Q2H  . aspirin  81 mg Per Tube Daily  . atorvastatin  80 mg Per Tube q1800  . azithromycin  500 mg Intravenous Q24H  . cefTRIAXone (ROCEPHIN)  IV  1 g Intravenous Q24H  . chlorhexidine gluconate  15 mL Mouth Rinse BID  . cloNIDine  0.1 mg Per Tube TID  . clopidogrel  75 mg Per Tube Daily  . hydrALAZINE  10 mg Intravenous Q6H  . insulin aspart  0-9 Units Subcutaneous Q4H  . ipratropium-albuterol  3 mL Nebulization Q6H  . metolazone  10 mg Oral Daily  . metoprolol  10 mg Intravenous Q4H  . pantoprazole (PROTONIX) IV  40 mg Intravenous Q12H  . terazosin  10 mg Per Tube QHS   . sodium chloride 10 mL/hr at 09/15/15 1900  . feeding supplement (VITAL AF 1.2 CAL) 1,000 mL (09/16/15 0200)  . heparin 10,000 units/ 20 mL infusion syringe 2,050 Units/hr (09/16/15 0800)  . dialysis replacement fluid (prismasate) 200 mL/hr at 09/14/15 1410  . dialysis replacement fluid (prismasate) 300 mL/hr at 09/16/15 0234  . dialysate (PRISMASATE) 2,000 mL/hr at 09/16/15 0802    Objective:  Vital Signs in the last 24 hours: Temp:  [97.2 F (36.2 C)-97.5 F (36.4 C)] 97.5 F (36.4 C) (11/18 0700) Pulse Rate:  [55-76] 58 (11/18 0700) Resp:  [9-19] 13 (11/18 0700) BP: (84-146)/(33-60) 110/50 mmHg (11/18 0700) SpO2:  [100 %] 100 % (11/18 0740) FiO2 (%):  [40 %-50 %] 40 % (11/18 0745) Weight:  [142 lb 11.2 oz (64.728 kg)] 142 lb 11.2 oz (64.728 kg) (11/18 0500)  Intake/Output from previous day: 11/17 0701 - 11/18 0700 In: 1156 [I.V.:276; NG/GT:540; IV Piggyback:300] Out: 3780 [Urine:109]  Physical Exam: NAD HEENT: normal Neck: supple Lungs: decreased breath sounds CV: RRR without murmur or gallop Abd: soft, NT, Positive BS, no hepatomegaly Ext: no C/C/E, distal pulses intact and equal Skin: warm/dry no rash  Lab Results:  Recent Labs  09/15/15 1459 09/16/15 0420  WBC 14.7* 10.6*  HGB 8.9* 8.2*  PLT 153 136*     Recent Labs  09/15/15 1459 09/16/15 0420  NA 141 138  K 4.5 4.2  CL 104 102  CO2 27 27  GLUCOSE 95 147*  BUN 60* 48*  CREATININE 2.28* 2.09*    Recent Labs  09/13/15 1115  TROPONINI 11.65*    Cardiac Studies: Echo 09/11/2015: LV EF 65-70%, mild LVH, grade 1 diastolic dysfunction, PA peak pressure 65 mmHg, trivial pericardial effusion  Echo 09/12/2015: Normal systolic function, normal wall motion, no regional wall motion abnormalities.  Tele: NSR, PVC   Assessment/Plan:  81 year old man with history of DM2, HTN, HLD, PVD, CAD, CKD stage 4, carotid artery disease, chronic anemia presenting with dyspnea x 2 days.   NSTEMI, CAD: Troponin 6.15 on admission up to 18.51. Downtrending. No wall motion abnormalities and LV EF 65-70% on echo 11/13. Repeat EKG with sinus rhythm, mild depression in II, aVF unchanged from previous EKG. Limited echo with normal systolic function and no regional wall motion abnormalities. Heparin gtt discontinued 11/15 as his troponin was downtrending/MI resolving. -Invasive interventions on hold for now per family wishes. -ASA  daily -Metoprolol  IV q4hr.  -Atorvastatin  daily. -Plavix  daily  Acute diastolic CHF: BNP 2200 and weight 155 on admission. CVP 8 this morning. Initiated on CRRT 11/16. urine output, 3.8L removed with CRRT and and overall -5.5L since  admission. Weight down to 142 from 149. -metolazone 10mg  daily -CRRT per renal  CAP, septic shock, acute hypoxic respiratory failure: CXR on admission with bilateral mid to lower lung zone airspace opacities. Intubated on 11/13 and self extubated 11/15. Lactic acidosis on admission to 6.69 which has resolved. Reintubated 11/16 for worsening respiratory status. -Rocephin and azithromycin per PCCM  Acute on chronic anemia, GI bleed: Hgb 7.5 on admission and received 1u pRBC on 11/13. Hgb 8.3 post transfusion. Hgb down to 6.7 11/15 and received 1 u pRBC. Hgb stable at 8.2  this morning. -PPI  Acute on CKD stage 4: Nephrologist is Dr. Darrick Pennaeterding. Baseline creatinine around 4. Creatinine on admission 3.82. Started on CRRT 11/16 due to volume overload in the setting of oliguria and worsening renal function. ATN and progression of renal disease -Nephrology following -CRRT  DM2: -SSI  Griffin BasilKrall, Jennifer, M.D. 09/16/2015, 8:17 AM

## 2015-09-16 NOTE — Progress Notes (Signed)
I spoke w/ Dr Celene SkeenNester to clarify vent orders/plans.  Per MD- okay for pt to be on PSV as pt tolerates and if pt is awake/cooperative.  Per MD, if pt needs to go on full vent support, it's okay for RT to try PRVC or PC whichever pt tolerates.

## 2015-09-17 LAB — RENAL FUNCTION PANEL
ANION GAP: 7 (ref 5–15)
Albumin: 2.2 g/dL — ABNORMAL LOW (ref 3.5–5.0)
Albumin: 2.4 g/dL — ABNORMAL LOW (ref 3.5–5.0)
Anion gap: 5 (ref 5–15)
BUN: 24 mg/dL — ABNORMAL HIGH (ref 6–20)
BUN: 19 mg/dL (ref 6–20)
CHLORIDE: 103 mmol/L (ref 101–111)
CHLORIDE: 102 mmol/L (ref 101–111)
CO2: 29 mmol/L (ref 22–32)
CO2: 30 mmol/L (ref 22–32)
Calcium: 8.1 mg/dL — ABNORMAL LOW (ref 8.9–10.3)
Calcium: 8.3 mg/dL — ABNORMAL LOW (ref 8.9–10.3)
Creatinine, Ser: 1.37 mg/dL — ABNORMAL HIGH (ref 0.61–1.24)
Creatinine, Ser: 1.19 mg/dL (ref 0.61–1.24)
GFR calc Af Amer: 54 mL/min — ABNORMAL LOW (ref >60)
GFR calc non Af Amer: 46 mL/min — ABNORMAL LOW (ref >60)
GFR, EST NON AFRICAN AMERICAN: 55 mL/min — AB (ref >60)
GLUCOSE: 131 mg/dL — AB (ref 65–99)
Glucose, Bld: 81 mg/dL (ref 65–99)
POTASSIUM: 4.7 mmol/L (ref 3.5–5.1)
POTASSIUM: 5.1 mmol/L (ref 3.5–5.1)
Phosphorus: 2.2 mg/dL — ABNORMAL LOW (ref 2.5–4.6)
Phosphorus: 2.6 mg/dL (ref 2.5–4.6)
Sodium: 139 mmol/L (ref 135–145)
Sodium: 137 mmol/L (ref 135–145)

## 2015-09-17 LAB — CBC WITH DIFFERENTIAL/PLATELET
BASOS ABS: 0 10*3/uL (ref 0.0–0.1)
BASOS PCT: 0 %
Basophils Absolute: 0 10*3/uL (ref 0.0–0.1)
Basophils Relative: 0 %
EOS ABS: 0.3 10*3/uL (ref 0.0–0.7)
EOS PCT: 2 %
EOS PCT: 3 %
Eosinophils Absolute: 0.3 10*3/uL (ref 0.0–0.7)
HCT: 26.6 % — ABNORMAL LOW (ref 39.0–52.0)
HEMATOCRIT: 27.5 % — AB (ref 39.0–52.0)
HEMOGLOBIN: 8.7 g/dL — AB (ref 13.0–17.0)
Hemoglobin: 8.6 g/dL — ABNORMAL LOW (ref 13.0–17.0)
Lymphocytes Relative: 10 %
Lymphocytes Relative: 8 %
Lymphs Abs: 1.1 10*3/uL (ref 0.7–4.0)
Lymphs Abs: 0.7 10*3/uL (ref 0.7–4.0)
MCH: 29.9 pg (ref 26.0–34.0)
MCH: 29.4 pg (ref 26.0–34.0)
MCHC: 32.3 g/dL (ref 30.0–36.0)
MCHC: 31.6 g/dL (ref 30.0–36.0)
MCV: 92.4 fL (ref 78.0–100.0)
MCV: 92.9 fL (ref 78.0–100.0)
MONO ABS: 1.1 10*3/uL — AB (ref 0.1–1.0)
MONO ABS: 0.8 10*3/uL (ref 0.1–1.0)
MONOS PCT: 10 %
MONOS PCT: 10 %
NEUTROS ABS: 8.5 10*3/uL — AB (ref 1.7–7.7)
NEUTROS PCT: 79 %
Neutro Abs: 6.6 10*3/uL (ref 1.7–7.7)
Neutrophils Relative %: 78 %
PLATELETS: 146 10*3/uL — AB (ref 150–400)
Platelets: 137 10*3/uL — ABNORMAL LOW (ref 150–400)
RBC: 2.88 MIL/uL — ABNORMAL LOW (ref 4.22–5.81)
RBC: 2.96 MIL/uL — AB (ref 4.22–5.81)
RDW: 16.7 % — AB (ref 11.5–15.5)
RDW: 16.5 % — AB (ref 11.5–15.5)
WBC: 11 10*3/uL — ABNORMAL HIGH (ref 4.0–10.5)
WBC: 8.4 10*3/uL (ref 4.0–10.5)

## 2015-09-17 LAB — POCT ACTIVATED CLOTTING TIME
ACTIVATED CLOTTING TIME: 128 s
ACTIVATED CLOTTING TIME: 233 s
ACTIVATED CLOTTING TIME: 239 s
Activated Clotting Time: 233 seconds
Activated Clotting Time: 227 seconds
Activated Clotting Time: 227 seconds
Activated Clotting Time: 220 seconds

## 2015-09-17 LAB — GLUCOSE, CAPILLARY
GLUCOSE-CAPILLARY: 145 mg/dL — AB (ref 65–99)
GLUCOSE-CAPILLARY: 152 mg/dL — AB (ref 65–99)
GLUCOSE-CAPILLARY: 99 mg/dL (ref 65–99)
Glucose-Capillary: 132 mg/dL — ABNORMAL HIGH (ref 65–99)
Glucose-Capillary: 194 mg/dL — ABNORMAL HIGH (ref 65–99)
Glucose-Capillary: 79 mg/dL (ref 65–99)
Glucose-Capillary: 123 mg/dL — ABNORMAL HIGH (ref 65–99)

## 2015-09-17 LAB — MAGNESIUM: Magnesium: 2.6 mg/dL — ABNORMAL HIGH (ref 1.7–2.4)

## 2015-09-17 LAB — APTT
aPTT: >200 seconds (ref 24–37)
aPTT: 31 seconds (ref 24–37)

## 2015-09-17 NOTE — Progress Notes (Signed)
[facility] ROUNDING NOTE   Subjective:   Interval History: now with hypotension limiting fluid removal  Objective:  Vital signs in last 24 hours:  Temp:  [94.2 F (34.6 C)-98.1 F (36.7 C)] 97.8 F (36.6 C) (11/19 0400) Pulse Rate:  [52-144] 62 (11/19 1100) Resp:  [10-28] 16 (11/19 1100) BP: (78-148)/(33-73) 87/36 mmHg (11/19 1100) SpO2:  [99 %-100 %] 100 % (11/19 1100) FiO2 (%):  [40 %] 40 % (11/19 0815) Weight:  [63.866 kg (140 lb 12.8 oz)] 63.866 kg (140 lb 12.8 oz) (11/19 0400)  Weight change: -0.862 kg (-1 lb 14.4 oz) Filed Weights   09/15/15 0402 09/16/15 0500 09/17/15 0400  Weight: 67.813 kg (149 lb 8 oz) 64.728 kg (142 lb 11.2 oz) 63.866 kg (140 lb 12.8 oz)    Intake/Output: I/O last 3 completed shifts: In: 2478.5 [I.V.:428.5; Other:40; NG/GT:1710; IV Piggyback:300] Out: 4423 [Urine:36; Other:4387]   Intake/Output this shift:  Total I/O In: 630.8 [I.V.:65.8; NG/GT:315; IV Piggyback:250] Out: 754 [Other:754]  GEN: lying in bed, NAD HEENT: normal NECK: supple LUNGS: anterior lung fields clear, intubated CARDIAC: RRR without murmur ABD: soft, non-tender, +BS, distended somewhat EXT: no edema, pulses intact NEURO: non-focal Skin: bruising, warm, and dry, intact   Basic Metabolic Panel:  Recent Labs Lab 09/13/15 0625 09/14/15 0610  09/15/15 0400 09/15/15 1459 09/16/15 0420 09/16/15 1545 09/17/15 0410  NA 141 145  < > 143  143 141 138 137 139  K 4.3 4.2  < > 4.1  4.1 4.5 4.2 4.3 4.7  CL 104 104  < > 105  105 104 102 103 103  CO2 23 23  < > GLUCOSE 145* 147*  < > 94  95 95 147* 133* 131*  BUN 149* 166*  < > 91*  91* 60* 48* 32* 24*  CREATININE 5.69* 6.01*  < > 2.91*  2.93* 2.28* 2.09* 1.53* 1.37*  CALCIUM 8.4* 8.9  < > 8.6*  8.5* 8.5* 7.8* 8.0* 8.1*  MG 2.8* 2.9*  --  2.7*  --  2.6*  --  2.6*  PHOS 7.5* 7.7*  < > 4.7*  4.7* 4.3 4.1 2.2* 2.2*  < > = values in this interval not displayed.  Liver Function  Tests:  Recent Labs Lab 09/11/15 0955 09/11/15 1450  09/15/15 0400 09/15/15 1459 09/16/15 0420 09/16/15 1545 09/17/15 0410  AST 73* 71*  --   --   --   --   --   --   ALT 22 23  --   --   --   --   --   --   ALKPHOS 82 84  --   --   --   --   --   --   BILITOT 1.3* 1.0  --   --   --   --   --   --   PROT 5.8* 6.4*  --   --   --   --   --   --   ALBUMIN 3.1* 3.0*  < > 2.4*  2.4* 2.5* 2.1* 2.3* 2.2*  < > = values in this interval not displayed. No results for input(s): LIPASE, AMYLASE in the last 168 hours. No results for input(s): AMMONIA in the last 168 hours.  CBC:  Recent Labs Lab 09/15/15 0400 09/15/15 1459 09/16/15 0420 09/16/15 1545 09/17/15 0410  WBC 14.9* 14.7* 10.6* 11.1* 11.0*  NEUTROABS 12.4* 12.0* 8.5* 8.7* 8.5*  HGB 8.9* 8.9*  8.2[place] Reg[address] 0mCountry LifKoreaeAgilent Techno 28 SMiCasimiro Needle [place] Fort<MEASUREMENTKorea>[employer]<BADMiCasimiro<MEASUREMENJoya MaGAlls ive MRSA colonization surveillance program. It is not intended to diagnose MRSA infection nor to guide or monitor treatment for MRSA infections.   Culture, respiratory (NON-Expectorated)     Status: None   Collection Time: 09/12/15  4:52 AM  Result Value Ref Range Status   Specimen Description TRACHEAL ASPIRATE  Final   Special Requests NONE  Final   Gram Stain   Final    ABUNDANT WBC PRESENT,BOTH PMN AND MONONUCLEAR RARE SQUAMOUS EPITHELIAL CELLS PRESENT NO ORGANISMS SEEN Performed at [facility]    Culture   Final    RARE YEAST CONSISTENT WITH CANDIDA SPECIES Performed at [facility]    Report Status 09/14/2015 FINAL  Final    Coagulation Studies: No results for input(s): LABPROT, INR in the last 72 hours.  Urinalysis: No results for input(s): COLORURINE, LABSPEC, PHURINE, GLUCOSEU, HGBUR, BILIRUBINUR, KETONESUR, PROTEINUR, UROBILINOGEN, NITRITE, LEUKOCYTESUR in the last 72 hours.  Invalid input(s): APPERANCEUR    Imaging: Dg Abd Portable 1v  09/15/2015  CLINICAL DATA:  NG tube placement EXAM: PORTABLE ABDOMEN - 1 VIEW COMPARISON:  09/15/2015 FINDINGS: Feeding tube tip in the proximal duodenum. Normal bowel gas pattern. IMPRESSION: Feeding tube tip in the proximal duodenum. Electronically Signed   By: Charles  Clark M.D.   On: 09/15/2015 13:30     Medications:   . sodium chloride 10 mL/hr at 09/17/15 1100  . feeding supplement (VITAL AF 1.2 CAL) 1,000 mL (09/17/15 0800)  . fentaNYL infusion INTRAVENOUS 100 mcg/hr (09/17/15 0800)  . heparin 10,000 units/ 20 mL infusion syringe Stopped (09/17/15 0600)  . dialysis replacement fluid (prismasate) 200 mL/hr at 09/16/15 1548  . dialysis replacement fluid (prismasate)  300 mL/hr at 09/16/15 2047  . dialysate (PRISMASATE) 2,000 mL/hr at 09/17/15 1005   . acetaminophen (TYLENOL) oral liquid 160 mg/5 mL  650 mg Oral 4 7 8iRobert Wood Johnson Un73 8veUt Health [facility]ssp[facility] <MEASouth Palm BeachENTWest Tennes see Healthcare Buf KoreafalobiAgilArdKorealeyTAgilent Technologiesgiesn H [id number]<Bern60 8 BRiver Parishes HospitalCBern79Fort DefianceRegional Surgery Center Pct. [facility]34m<MEA71 8URThe Pavilion At Williamsburg PlaceayLas LomGreeMaishRough RNorth Ms Medical Center - IukaockoreaalleyAgilent Technologies22moas  .  antiseptic oral rinse  7 mL Mouth Rinse Q2H  . aspirin  81 mg Per Tube Daily  . atorvastatin  80 mg Per Tube q1800  . chlorhexidine gluconate  15 mL Mouth Rinse BID  . cloNIDine  0.1 mg Per Tube TID  . clopidogrel  75 mg Per Tube Daily  . insulin aspart  0-9 Units Subcutaneous Q4H  . ipratropium-albuterol  3 mL Nebulization Q6H  . metoprolol tartrate  12.5 mg Per Tube BID  . OLANZapine  5 mg Per Tube QHS  . pantoprazole sodium  40 mg Per Tube BID  . terazosin  10 mg Per Tube QHS   fentaNYL, fentaNYL (SUBLIMAZE) injection, heparin, heparin, heparin, midazolam, ondansetron (ZOFRAN) IV, senna, sodium chloride  Assessment/ Plan:   1. AKI in CKD4: Nephrologist is Dr. Darrick Penna. Baseline creatinine around 4. On admission Cr was at baseline but has continued to worsen. He is now oliguric. He has been given Lasix and metolazone without much UOP. Believe this is ATN with progression of renal disease.  -Scr was worsening and CRRT started 11/16 --- continues  -monitor I/Os and daily weights -trend labs - CRRT initiated and removal of 1.8 L ultrafiltrate today  -avoid nephrotoxic agents and contrast  - -palliative consult to helps with goals of care Discussed with daughter Reece Levy ( she appears prepared and realistic )   2. Dyspnea: patient was admitted to ICU with acute hypoxic respiratory failure due to pulmonary edema +/- CAP. He has decompensated diastolic CHF. Lactic acidosis on admission to 6.69 which has resolved. -Pulmonology following -on rocephin and azithromycin   3. NSTEMI: Troponins continued to rise on admission qualifying for NSTEMI.  -cardiology following Considering Right heart catheterization -holding off on cath in setting of possible HD - Heparin started with CRRT  4. Aneima: of chronic disease along with  acute blood loss anemia from GI bleed(positive stool occult). S/p 1 u PRBCs.  -on PPI -GI following; unstable for procedure  Discussed condition withDeb ( daughter ) Very little progress although condition is stable. CXR pending but suspect not much additional fluid can be removed. I think that extubation with no plans for reintubation should be considered. I discussed this with Deb and Dr Craige Cotta. The are in agreement   LOS: 6 Lyonel Morejon W  :13 AM

## 2015-09-17 NOTE — Progress Notes (Signed)
CRITICAL VALUE ALERT  Critical value received:  APTT > 200   Date of notification:  09/17/2015  Time of notification:  5:49 AM   Critical value read back:Yes.    Called [facility] and spoke with MD Dellie CatholicSommers, MD advised to contact nephrology to discuss critical value due to heparin is running through CRRT.   Called MD Hyman HopesWebb, MD advised to stop heparin through CRRT & redraw aPTT in one hour.  [name], Kaiyah Eber A, RN

## 2015-09-17 NOTE — Progress Notes (Signed)
Dr.Schertz notified of early AM PTT greater than 200 twice with no ACT's showing up in EPIC for today with order to continue to hold Heparin via CRRT.

## 2015-09-17 NOTE — Progress Notes (Signed)
Subjective:  Intubated, sedated  . acetaminophen (TYLENOL) oral liquid 160 mg/5 mL  650 mg Oral 4 times per day  . antiseptic oral rinse  7 mL Mouth Rinse Q2H  . aspirin  81 mg Per Tube Daily  . atorvastatin  80 mg Per Tube q1800  . azithromycin  500 mg Intravenous Q24H  . cefTRIAXone (ROCEPHIN)  IV  1 g Intravenous Q24H  . chlorhexidine gluconate  15 mL Mouth Rinse BID  . cloNIDine  0.1 mg Per Tube TID  . clopidogrel  75 mg Per Tube Daily  . insulin aspart  0-9 Units Subcutaneous Q4H  . ipratropium-albuterol  3 mL Nebulization Q6H  . metoprolol tartrate  12.5 mg Per Tube BID  . OLANZapine  5 mg Per Tube QHS  . pantoprazole sodium  40 mg Per Tube BID  . terazosin  10 mg Per Tube QHS   . sodium chloride 10 mL/hr at 09/16/15 2000  . feeding supplement (VITAL AF 1.2 CAL) 1,000 mL (09/16/15 2200)  . fentaNYL infusion INTRAVENOUS 50 mcg/hr (09/16/15 2000)  . heparin 10,000 units/ 20 mL infusion syringe Stopped (09/17/15 0600)  . dialysis replacement fluid (prismasate) 200 mL/hr at 09/16/15 1548  . dialysis replacement fluid (prismasate) 300 mL/hr at 09/16/15 2047  . dialysate (PRISMASATE) 2,000 mL/hr at 09/17/15 0451    Objective:  Vital Signs in the last 24 hours: Temp:  [94.2 F (34.6 C)-98.1 F (36.7 C)] 97.8 F (36.6 C) (11/19 0400) Pulse Rate:  [43-144] 70 (11/19 0630) Resp:  [10-28] 17 (11/19 0630) BP: (83-151)/(33-73) 100/36 mmHg (11/19 0630) SpO2:  [99 %-100 %] 100 % (11/19 0630) FiO2 (%):  [40 %] 40 % (11/19 0406) Weight:  [140 lb 12.8 oz (63.866 kg)] 140 lb 12.8 oz (63.866 kg) (11/19 0400)  Intake/Output from previous day: 11/18 0701 - 11/19 0700 In: 1853.5 [I.V.:293.5; NG/GT:1260; IV Piggyback:300] Out: 3062 [Urine:22]  Physical Exam: NAD HEENT: normal Neck: supple Lungs: decreased breath sounds CV: RRR without murmur or gallop Abd: soft, NT, Positive BS, no hepatomegaly Ext: no C/C/E, distal pulses intact and equal Skin: warm/dry no rash  Lab  Results:  Recent Labs  09/16/15 1545 09/17/15 0410  WBC 11.1* 11.0*  HGB 8.5* 8.6*  PLT 132* 146*    Recent Labs  09/16/15 1545 09/17/15 0410  NA 137 139  K 4.3 4.7  CL 103 103  CO2 27 29  GLUCOSE 133* 131*  BUN 32* 24*  CREATININE 1.53* 1.37*   No results for input(s): TROPONINI in the last 72 hours.  Invalid input(s): CK, MB  Cardiac Studies: Echo 09/11/2015: LV EF 65-70%, mild LVH, grade 1 diastolic dysfunction, PA peak pressure 65 mmHg, trivial pericardial effusion  Echo 09/12/2015: Normal systolic function, normal wall motion, no regional wall motion abnormalities.  Tele: NSR, PVC   Assessment/Plan:  81 year old man with history of DM2, HTN, HLD, PVD, CAD, CKD stage 4, carotid artery disease, chronic anemia presenting with dyspnea x 2 days.   NSTEMI, CAD: Troponin 6.15 on admission up to 18.51. Downtrending. No wall motion abnormalities and LV EF 65-70% on echo 11/13. Repeat EKG with sinus rhythm, mild depression in II, aVF unchanged from previous EKG. Limited echo with normal systolic function and no regional wall motion abnormalities. Heparin gtt discontinued 11/15 as his troponin was downtrending/MI resolving. -Invasive interventions on hold for now per family wishes. -ASA 81mg  daily -Metoprolol 12.5mg  BID per tube. -Atorvastatin 80mg  daily. -Plavix 75mg  daily  Acute diastolic CHF: BNP 2200 and  weight 155 on admission. CVP 4 this morning. Initiated on CRRT 11/16. 22mL urine output, 3.1L removed with CRRT and and overall -6.7L since admission. Weight down to 140 from 142. -metolazone  daily -CRRT per renal  CAP, septic shock, acute hypoxic respiratory failure: CXR on admission with bilateral mid to lower lung zone airspace opacities. Intubated on 11/13 and self extubated 11/15. Lactic acidosis on admission to 6.69 which has resolved. Reintubated 11/16 for worsening respiratory status. -Rocephin and azithromycin per PCCM  Acute on chronic anemia, GI  bleed: Hgb 7.5 on admission and received 1u pRBC on 11/13. Hgb 8.3 post transfusion. Hgb down to 6.7 11/15 and received 1 u pRBC. Hgb stable at 8.6 this morning. -PPI  Acute on CKD stage 4: Nephrologist is Dr. Darrick Penna. Baseline creatinine around 4. Creatinine on admission 3.82. Started on CRRT 11/16 due to volume overload in the setting of oliguria and worsening renal function. ATN and progression of renal disease. -Nephrology following -CRRT  DM2: -SSI  Griffin Basil, M.D. 09/17/2015, 7:05 AM   Patient seen with resident, agree with the above note.  He remains on CVVH, good fluid removal with CVP down to 3.  Stable hemodynamically.  At this point, think we will need to see how he does off CVVH.  I think prognosis is poor, and if he is not able to manage off CVVH, comfort care would be appropriate.   Marca Ancona 09/17/2015 8:20 AM

## 2015-09-17 NOTE — Progress Notes (Signed)
PULMONARY / CRITICAL CARE MEDICINE   Name: [name] MRN: [id number] DOB: 12/13/1929    ADMISSION DATE:  09/11/2015 CONSULTATION DATE:  09/11/2015  REFERRING MD :  Butler Denmarkizwan   CHIEF COMPLAINT:  Acute Respiratory Failure  INITIAL PRESENTATION: 81 y/o male w/ stage IV CKD , HTN and CAD. Presents to the ED on 11/13 w/ acute resp failure in setting of acute pulmonary edema, hypertensive crisis and NSTEMI +/- CAP CCM asked to assess given degree of respiratory failure and ongoing ischemia w/ concern the pt would require intubation.   STUDIES:  ECHO 11/13 >> EF 65 to 70%, grade 1 diastolic dysfx, PAS 65 mmHg . SIGNIFICANT EVENTS: 11/13 - Admit  11/13 - Intubation  11/15 - Self-extubation 11/16 - Reintubation & HD catheter placement for CVVHD  SUBJECTIVE:  Remains on increased PEEP, CRRT.  VITAL SIGNS: Temp:  [94.2 F (34.6 C)-98.1 F (36.7 C)] 97.8 F (36.6 C) (11/19 0400) Pulse Rate:  [52-144] 65 (11/19 1000) Resp:  [10-28] 16 (11/19 1000) BP: (83-148)/(33-73) 99/45 mmHg (11/19 1000) SpO2:  [99 %-100 %] 100 % (11/19 1000) FiO2 (%):  [40 %] 40 % (11/19 0815) Weight:  [140 lb 12.8 oz (63.866 kg)] 140 lb 12.8 oz (63.866 kg) (11/19 0400) HEMODYNAMICS: CVP:  [1 mmHg-6 mmHg] 3 mmHg VENTILATOR SETTINGS: Vent Mode:  [-] PRVC FiO2 (%):  [40 %] 40 % Set Rate:  [16 bmp] 16 bmp Vt Set:  [510 mL] 510 mL PEEP:  [8 cmH20] 8 cmH20 Pressure Support:  [8 cmH20-12 cmH20] 8 cmH20 Plateau Pressure:  [17 cmH20-21 cmH20] 18 cmH20 INTAKE / OUTPUT:  Intake/Output Summary (Last 24 hours) at 09/17/15 1037 Last data filed at 09/17/15 1000  Gross per 24 hour  Intake   1705 ml  Output   2994 ml  Net  -1289 ml    PHYSICAL EXAMINATION: General: sedated Neuro: RASS -2 HEENT: ETT in place Cardiac: regular Chest: faint b/l crackles Abd: soft, non tender Ext: no edema Skin: no rashes  LABS:  CBC  Recent Labs Lab 09/16/15 0420 09/16/15 1545 09/17/15 0410  WBC 10.6* 11.1* 11.0*  HGB  8.2* 8.5* 8.6*  HCT 25.3* 25.9* 26.6*  PLT 136* 132* 146*   Coag's  Recent Labs Lab 09/11/15 0955 09/11/15 1450  09/16/15 0420 09/17/15 0410 09/17/15 0700  APTT  --  31  < > 152* >200* >200*  INR 1.25 1.35  --   --   --   --   < > = values in this interval not displayed.   BMET  Recent Labs Lab 09/16/15 0420 09/16/15 1545 09/17/15 0410  NA 138 137 139  K 4.2 4.3 4.7  CL 102 103 103  CO2 27 27 29   BUN 48* 32* 24*  CREATININE 2.09* 1.53* 1.37*  GLUCOSE 147* 133* 131*   Electrolytes  Recent Labs Lab 09/15/15 0400  09/16/15 0420 09/16/15 1545 09/17/15 0410  CALCIUM 8.6*  8.5*  < > 7.8* 8.0* 8.1*  MG 2.7*  --  2.6*  --  2.6*  PHOS 4.7*  4.7*  < > 4.1 2.2* 2.2*  < > = values in this interval not displayed.   Sepsis Markers  Recent Labs Lab 09/11/15 2020 09/12/15 0235 09/12/15 0945 09/13/15 0625 09/15/15 0400  LATICACIDVEN 2.3*  --  1.1 0.9  --   PROCALCITON  --  8.57  --  8.84 3.04   ABG  Recent Labs Lab 09/11/15 1712 09/14/15 1452 09/14/15 2350  PHART 7.319* 7.401 7.401  PCO2ART 57.1* 39.3 42.3  PO2ART 302.0* 60.0* 47.0*   Liver Enzymes  Recent Labs Lab 09/11/15 0955 09/11/15 1450  09/16/15 0420 09/16/15 1545 09/17/15 0410  AST 73* 71*  --   --   --   --   ALT 22 23  --   --   --   --   ALKPHOS 82 84  --   --   --   --   BILITOT 1.3* 1.0  --   --   --   --   ALBUMIN 3.1* 3.0*  < > 2.1* 2.3* 2.2*  < > = values in this interval not displayed.   Cardiac Enzymes  Recent Labs Lab 09/11/15 2020 09/12/15 0235 09/13/15 1115  TROPONINI 11.37* 18.51* 11.65*   Glucose  Recent Labs Lab 09/16/15 1200 09/16/15 1533 09/16/15 2002 09/16/15 2354 09/17/15 0407 09/17/15 0758  GLUCAP 121* 122* 118* 152* 132* 99    Imaging No results found.  ASSESSMENT / PLAN:  PULMONARY ETT 11/13 - 11/15 (self extubation); 11/16>>> A: Acute respiratory failure 2nd to pulmonary edema and pneumonia. P:   Full vent support F/u CXR Scheduled  BDs  CARDIOVASCULAR R Subclavian CVC 11/13>>> R IJ HD Catheter 11/16>>> A:  NSTEMI with hx of CAD. HTN emergency >> resolved. P:  Lipitor, ASA, catapres, plavix, lopressor, hytrin  RENAL A:   AKI. Lactic acidosis - Resolved. Hypokalemia - Resolved. P:   CRRT per nephrology  GASTROINTESTINAL A:   Heme + stools. Nutrition. P:   Tube feeds Protonix BID  HEMATOLOGIC A:   Anemia of chronic disease - Hgb improving. S/P transfusion 1u PRBC 11/13 & 1u PRBC on 11/14. Heme + stools - No hematochezia or melena. Leukocytosis - Improving. Multifactorial in etiology. P:  F/u CBC  INFECTIOUS A:   Sepsis from PNA. P:   Day 7 of Abx,currently on rocephin, zithromax  Ctx: Trach Asp 11/14 - Oral Flora Blood x2 11/13>>> Urine Strep 11/13 - Negative Urine Legionella 11/13 - Negative Influenza PCR 11/14 - Negative  ENDOCRINE A:   H/O DM - BG well controlled. P:   Accu-Checks q4hr Low dose SSI Protocol  NEUROLOGIC A:   Sedation needs on ventilator. P:   Goal RASS:  0 to -1 on ventilator Fentanyl & Versed IV prn  Summary: Does not seem to be making progress with vent weaning or renal fx.  Agree with having palliative care d/w family about goals of care.  CC time 32 minutes.  Coralyn Helling, MD [facility] Pulmonary/Critical Care 09/17/2015, 10:48 AM Pager:  [phone] After 3pm call: [phone]

## 2015-09-18 ENCOUNTER — Inpatient Hospital Stay (HOSPITAL_COMMUNITY): Payer: Medicare Other

## 2015-09-18 DIAGNOSIS — Z66 Do not resuscitate: Secondary | ICD-10-CM

## 2015-09-18 LAB — GLUCOSE, CAPILLARY
Glucose-Capillary: 132 mg/dL — ABNORMAL HIGH (ref 65–99)
Glucose-Capillary: 145 mg/dL — ABNORMAL HIGH (ref 65–99)
Glucose-Capillary: 115 mg/dL — ABNORMAL HIGH (ref 65–99)

## 2015-09-18 LAB — MAGNESIUM: MAGNESIUM: 2.4 mg/dL (ref 1.7–2.4)

## 2015-09-18 LAB — RENAL FUNCTION PANEL
ALBUMIN: 2.2 g/dL — AB (ref 3.5–5.0)
ANION GAP: 6 (ref 5–15)
BUN: 21 mg/dL — AB (ref 6–20)
CALCIUM: 8 mg/dL — AB (ref 8.9–10.3)
CO2: 29 mmol/L (ref 22–32)
CREATININE: 1.29 mg/dL — AB (ref 0.61–1.24)
Chloride: 102 mmol/L (ref 101–111)
GFR calc Af Amer: 58 mL/min — ABNORMAL LOW (ref >60)
GFR calc non Af Amer: 50 mL/min — ABNORMAL LOW (ref >60)
GLUCOSE: 131 mg/dL — AB (ref 65–99)
PHOSPHORUS: 1.9 mg/dL — AB (ref 2.5–4.6)
Potassium: 5.2 mmol/L — ABNORMAL HIGH (ref 3.5–5.1)
SODIUM: 137 mmol/L (ref 135–145)

## 2015-09-18 LAB — CBC WITH DIFFERENTIAL/PLATELET
BASOS PCT: 0 %
Basophils Absolute: 0 10*3/uL (ref 0.0–0.1)
EOS ABS: 0.3 10*3/uL (ref 0.0–0.7)
Eosinophils Relative: 2 %
HCT: 27 % — ABNORMAL LOW (ref 39.0–52.0)
Hemoglobin: 8.8 g/dL — ABNORMAL LOW (ref 13.0–17.0)
Lymphocytes Relative: 12 %
Lymphs Abs: 1.6 10*3/uL (ref 0.7–4.0)
MCH: 30.4 pg (ref 26.0–34.0)
MCHC: 32.6 g/dL (ref 30.0–36.0)
MCV: 93.4 fL (ref 78.0–100.0)
MONO ABS: 0.8 10*3/uL (ref 0.1–1.0)
Monocytes Relative: 6 %
NEUTROS PCT: 80 %
Neutro Abs: 10.4 10*3/uL — ABNORMAL HIGH (ref 1.7–7.7)
PLATELETS: 130 10*3/uL — AB (ref 150–400)
RBC: 2.89 MIL/uL — AB (ref 4.22–5.81)
RDW: 16.6 % — AB (ref 11.5–15.5)
WBC: 13.1 10*3/uL — AB (ref 4.0–10.5)

## 2015-09-18 LAB — APTT: APTT: 30 s (ref 24–37)

## 2015-09-18 MED ORDER — CLOPIDOGREL BISULFATE 75 MG PO TABS: 75.0000 mg | ORAL_TABLET | Freq: Every day | ORAL | Status: DC

## 2015-09-18 MED ORDER — SODIUM CHLORIDE 0.9 % IV SOLN: INTRAVENOUS | Status: DC | PRN

## 2015-09-18 MED ORDER — ACETAMINOPHEN 650 MG RE SUPP: 650.0000 mg | RECTAL | Status: DC | PRN

## 2015-09-18 MED ORDER — SENNA 8.6 MG PO TABS: 2.0000 | ORAL_TABLET | Freq: Every evening | ORAL | Status: DC | PRN

## 2015-09-18 MED ORDER — FENTANYL CITRATE (PF) 100 MCG/2ML IJ SOLN: 25.0000 ug | INTRAMUSCULAR | Status: DC | PRN

## 2015-09-18 MED ORDER — SODIUM CHLORIDE 0.9 % IV SOLN
510.0000 mg | INTRAVENOUS | Status: DC
  Filled 2015-09-18: qty 17

## 2015-09-18 MED ORDER — OLANZAPINE 5 MG PO TABS
5.0000 mg | ORAL_TABLET | Freq: Every day | ORAL | Status: DC
  Filled 2015-09-18: qty 1

## 2015-09-18 MED ORDER — ATORVASTATIN CALCIUM 80 MG PO TABS: 80.0000 mg | ORAL_TABLET | Freq: Every day | ORAL | Status: DC

## 2015-09-18 MED ORDER — LORAZEPAM 2 MG/ML IJ SOLN: 1.0000 mg | INTRAMUSCULAR | Status: DC | PRN

## 2015-09-18 MED ORDER — IPRATROPIUM-ALBUTEROL 0.5-2.5 (3) MG/3ML IN SOLN: 3.0000 mL | RESPIRATORY_TRACT | Status: DC | PRN

## 2015-09-18 MED ORDER — ASPIRIN 81 MG PO CHEW: 81.0000 mg | CHEWABLE_TABLET | Freq: Every day | ORAL | Status: DC

## 2015-09-18 MED ORDER — PANTOPRAZOLE SODIUM 40 MG PO PACK: 40.0000 mg | PACK | Freq: Two times a day (BID) | ORAL | Status: DC

## 2015-09-18 MED ORDER — FERUMOXYTOL INJECTION 510 MG/17 ML
Freq: Once | INTRAVENOUS | Status: DC
  Filled 2015-09-18: qty 100

## 2015-09-18 MED ORDER — METOPROLOL TARTRATE 25 MG/10 ML ORAL SUSPENSION: 12.5000 mg | Freq: Two times a day (BID) | ORAL | Status: DC

## 2015-09-18 MED ORDER — CLONIDINE HCL 0.1 MG PO TABS: 0.1000 mg | ORAL_TABLET | Freq: Three times a day (TID) | ORAL | Status: DC

## 2015-09-18 MED ORDER — SODIUM CHLORIDE 0.9 % IV SOLN: 510.0000 mg | Freq: Once | INTRAVENOUS | Status: DC

## 2015-09-18 MED ORDER — TERAZOSIN HCL 5 MG PO CAPS
10.0000 mg | ORAL_CAPSULE | Freq: Every day | ORAL | Status: DC
  Filled 2015-09-18: qty 2

## 2015-09-18 NOTE — Progress Notes (Signed)
PULMONARY / CRITICAL CARE MEDICINE   Name: [name] MRN: [id number] DOB: 01/17/1930    ADMISSION DATE:  09/11/2015 CONSULTATION DATE:  09/11/2015  REFERRING MD :  Butler Denmarkizwan   CHIEF COMPLAINT:  Acute Respiratory Failure  INITIAL PRESENTATION: 81 y/o male w/ stage IV CKD , HTN and CAD. Presents to the ED on 11/13 w/ acute resp failure in setting of acute pulmonary edema, hypertensive crisis and NSTEMI +/- CAP CCM asked to assess given degree of respiratory failure and ongoing ischemia w/ concern the pt would require intubation.   STUDIES:  ECHO 11/13 >> EF 65 to 70%, grade 1 diastolic dysfx, PAS 65 mmHg . SIGNIFICANT EVENTS: 11/13 - Admit  11/13 - Intubation  11/15 - Self-extubation 11/16 - Reintubation & HD catheter placement for CVVHD  SUBJECTIVE:  Remains on CRRT  Family discussion with plans for extubation w/ no re-intubation   VITAL SIGNS: Temp:  [97.3 F (36.3 C)-97.9 F (36.6 C)] 97.9 F (36.6 C) (11/20 0400) Pulse Rate:  [62-92] 92 (11/20 1000) Resp:  [13-21] 21 (11/20 1000) BP: (78-152)/(33-113) 132/110 mmHg (11/20 1000) SpO2:  [90 %-100 %] 100 % (11/20 1000) FiO2 (%):  [40 %] 40 % (11/20 1000) Weight:  [62.143 kg (137 lb)] 62.143 kg (137 lb) (11/20 0500) HEMODYNAMICS: CVP:  [1 mmHg-5 mmHg] 5 mmHg VENTILATOR SETTINGS: Vent Mode:  [-] PRVC FiO2 (%):  [40 %] 40 % Set Rate:  [16 bmp] 16 bmp Vt Set:  [510 mL] 510 mL PEEP:  [8 cmH20] 8 cmH20 Plateau Pressure:  [14 cmH20-22 cmH20] 15 cmH20 INTAKE / OUTPUT:  Intake/Output Summary (Last 24 hours) at 09/18/15 1022 Last data filed at 09/18/15 1000  Gross per 24 hour  Intake 2145.83 ml  Output   3246 ml  Net -1100.17 ml    PHYSICAL EXAMINATION: General: sedated Neuro: RASS 0 to -1 HEENT: ETT in place Cardiac: regular Chest: faint b/l crackles Abd: soft, non tender Ext: no edema Skin: no rashes  LABS:  CBC  Recent Labs Lab 09/17/15 0410 09/17/15 1550 09/18/15 0420  WBC 11.0* 8.4 13.1*  HGB 8.6*  8.7* 8.8*  HCT 26.6* 27.5* 27.0*  PLT 146* 137* 130*   Coag's  Recent Labs Lab 09/11/15 1450  09/17/15 0700 09/17/15 2154 09/18/15 0420  APTT 31  < > >200* 31 30  INR 1.35  --   --   --   --   < > = values in this interval not displayed.   BMET  Recent Labs Lab 09/17/15 0410 09/17/15 1550 09/18/15 0420  NA 139 137 137  K 4.7 5.1 5.2*  CL 103 102 102  CO2 29 30 29   BUN 24* 19 21*  CREATININE 1.37* 1.19 1.29*  GLUCOSE 131* 81 131*   Electrolytes  Recent Labs Lab 09/16/15 0420  09/17/15 0410 09/17/15 1550 09/18/15 0420  CALCIUM 7.8*  < > 8.1* 8.3* 8.0*  MG 2.6*  --  2.6*  --  2.4  PHOS 4.1  < > 2.2* 2.6 1.9*  < > = values in this interval not displayed.   Sepsis Markers  Recent Labs Lab 09/11/15 2020 09/12/15 0235 09/12/15 0945 09/13/15 0625 09/15/15 0400  LATICACIDVEN 2.3*  --  1.1 0.9  --   PROCALCITON  --  8.57  --  8.84 3.04   ABG  Recent Labs Lab 09/11/15 1712 09/14/15 1452 09/14/15 2350  PHART 7.319* 7.401 7.401  PCO2ART 57.1* 39.3 42.3  PO2ART 302.0* 60.0* 47.0*   Liver Enzymes  Recent Labs Lab 09/11/15 1450  09/17/15 0410 09/17/15 1550 09/18/15 0420  AST 71*  --   --   --   --   ALT 23  --   --   --   --   ALKPHOS 84  --   --   --   --   BILITOT 1.0  --   --   --   --   ALBUMIN 3.0*  < > 2.2* 2.4* 2.2*  < > = values in this interval not displayed.   Cardiac Enzymes  Recent Labs Lab 09/11/15 2020 09/12/15 0235 09/13/15 1115  TROPONINI 11.37* 18.51* 11.65*   Glucose  Recent Labs Lab 09/17/15 0758 09/17/15 1201 09/17/15 1557 09/17/15 1939 09/17/15 2333 09/18/15 0424  GLUCAP 99 194* 79 123* 145* 132*    Imaging Dg Chest Port 1 View  09/18/2015  CLINICAL DATA:  Pneumonia, shortness of breath. EXAM: PORTABLE CHEST 1 VIEW COMPARISON:  09/15/2015 FINDINGS: Endotracheal tube is in stable position, 3 cm superior to the carina. Right PICC line terminates at the expected location of cavoatrial junction. Large bore  central venous catheter versus sheath from right internal jugular approach is stable in appearance, terminating within the expected location of superior vena cava. Feeding catheter is partially visualized, excluded by collimation. Cardiomediastinal silhouette is normal. Mediastinal contours appear intact. There is no evidence of pleural effusion or pneumothorax. Lower lung fields excluded by collimation, but there is improved aeration of the visualized portions of the lungs. Osseous structures are without acute abnormality. Soft tissues are grossly normal. IMPRESSION: Improved aeration of the visualized portions of the lungs. Lines and tubes as above. Electronically Signed   By: Ted Mcalpine M.D.   On: 09/18/2015 08:54    ASSESSMENT / PLAN:  PULMONARY ETT 11/13 - 11/15 (self extubation); 11/16>>> A: Acute respiratory failure 2nd to pulmonary edema and pneumonia. P:   Extubate today with no plans for re-intubation  F/u CXR Scheduled BDs  CARDIOVASCULAR R Subclavian CVC 11/13>>> R IJ HD Catheter 11/16>>> A:  NSTEMI with hx of CAD. HTN emergency >> resolved. P:  Lipitor, ASA, catapres, plavix, lopressor, hytrin  RENAL A:   AKI. Lactic acidosis - Resolved. Hypokalemia - Resolved. P:   CRRT per nephrology  GASTROINTESTINAL A:   Heme + stools. Nutrition. P:   Tube feeds Protonix BID  HEMATOLOGIC A:   Anemia of chronic disease - Hgb improving. S/P transfusion 1u PRBC 11/13 & 1u PRBC on 11/14. Heme + stools - No hematochezia or melena. Leukocytosis - Improving. Multifactorial in etiology. P:  F/u CBC  INFECTIOUS A:   Sepsis from PNA. P:   Day 7 of Abx,currently on rocephin, zithromax>complete 11/ 19   Ctx: Trach Asp 11/14 - Oral Flora Blood x2 11/13>>>NEG  Urine Strep 11/13 - Negative Urine Legionella 11/13 - Negative Influenza PCR 11/14 - Negative  ENDOCRINE A:   H/O DM - BG well controlled. P:   Accu-Checks q4hr Low dose SSI Protocol  NEUROLOGIC A:    Sedation needs on ventilator. P:   Goal RASS:  0 to -1 on ventilator Fentanyl & Versed IV prn  Summary: Does not seem to be making progress with vent weaning or renal fx. Plan for extubation with no plans for re-intubation   [name] NP-C  [facility]  [phone]

## 2015-09-18 NOTE — Progress Notes (Signed)
[name] ROUNDING NOTE   Subjective:   Interval History:  Discussed with family ( [name] ) For extubation today and will see how things go . Not for reintubation  Objective:  Vital signs in last 24 hours:  Temp:  [97.3 F (36.3 C)-97.9 F (36.6 C)] 97.9 F (36.6 C) (11/20 0400) Pulse Rate:  [63-92] 91 (11/20 1037) Resp:  [13-21] 21 (11/20 1000) BP: (96-152)/(33-113) 132/48 mmHg (11/20 1039) SpO2:  [90 %-100 %] 100 % (11/20 1000) FiO2 (%):  [40 %] 40 % (11/20 1000) Weight:  [62.143 kg (137 lb)] 62.143 kg (137 lb) (11/20 0500)  Weight change: -1.724 kg (-3 lb 12.8 oz) Filed Weights   09/16/15 0500 09/17/15 0400 09/18/15 0500  Weight: 64.728 kg (142 lb 11.2 oz) 63.866 kg (140 lb 12.8 oz) 62.143 kg (137 lb)    Intake/Output: I/O last 3 completed shifts: In: 3073.3 [I.V.:598.3; NG/GT:2225; IV Piggyback:250] Out: 4793 [Urine:20; Other:4773]   Intake/Output this shift:  Total I/O In: 217.5 [I.V.:52.5; NG/GT:165] Out: 390 [Other:390]  GEN: lying in bed, NAD HEENT: normal NECK: supple LUNGS: anterior lung fields clear, intubat ed CARDIAC: RRR without murmur ABD: soft, non-tender, +BS, distended somewhat EXT: no edema, pulses intact NEURO: non-focal Skin: bruising, warm, and dry, intact   Basic Metabolic Panel:  Recent Labs Lab 09/14/15 0610  09/15/15 0400  09/16/15 0420 09/16/15 1545 09/17/15 0410 09/17/15 1550 09/18/15 0420  NA 145  < > 143  143  < > 138 137 139 137 137  K 4.2  < > 4.1  4.1  < > 4.2 4.3 4.7 5.1 5.2*  CL 104  < > 105  105  < > 102 103 103 102 102  CO2 23  < > 26  25  < > GLUCOSE 147*  < > 94  95  < > 147* 133* 131* 81 131*  BUN 166*  < > 91*  91*  < > 48* 32* 24* 19 21*  CREATININE 6.01*  < > 2.91*  2.93*  < > 2.09* 1.53* 1.37* 1.19 1.29*  CALCIUM 8.9  < > 8.6*  8.5*  < > 7.8* 8.0* 8.1* 8.3* 8.0*  MG 2.9*  --  2.7*  --  2.6*  --  2.6*  --  2.4  PHOS 7.7*  < > 4.7*  4.7*  < > 4.1 2.2* 2.2* 2.6 1.9*  < >  = values in this interval not displayed.  Liver Function Tests:  Recent Labs Lab 09/11/15 1450  09/16/15 0420 09/16/15 1545 09/17/15 0410 09/17/15 1550 09/18/15 0420  AST 71*  --   --   --   --   --   --   ALT 23  --   --   --   --   --   --   ALKPHOS 84  --   --   --   --   --   --   BILITOT 1.0  --   --   --   --   --   --   PROT 6.4*  --   --   --   --   --   --   ALBUMIN 3.0*  < > 2.1* 2.3* 2.2* 2.4* 2.2*  < > = values in this interval not displayed. No results for input(s): LIPASE, AMYLASE in the last 168 hours. No results for input(s): AMMONIA in the last 168 hours.  CBC:  Recent Labs Lab 09/16/15  [id number] 09/16/15 1545 09/17/15 0410 09/17/15 1550 09/18/15 0420  WBC 10.6* 11.1* 11.0* 8.4 13.1*  NEUTROABS 8.5* 8.7* 8.5* 6.6 10.4*  HGB 8.2* 8.5* 8.6* 8.7* 8.8*  HCT 25.3* 25.9* 26.6* 27.5* 27.0*  MCV 92.0 91.2 92.4 92.9 93.4  PLT 136* 132* 146* 137* 130*    Cardiac Enzymes:  Recent Labs Lab 09/11/15 1450 09/11/15 2020 09/12/15 0235 09/13/15 1115  TROPONINI 6.15* 11.37* 18.51* 11.65*    BNP: Invalid input(s): POCBNP  CBG:  Recent Labs Lab 09/17/15 1201 09/17/15 1557 09/17/15 1939 09/17/15 2333 09/18/15 0424  GLUCAP 194* 79 123* 145* 132*    Microbiology: Results for orders placed or performed during the hospital encounter of 09/11/15  Blood culture (routine x 2)     Status: None   Collection Time: 09/11/15  9:55 AM  Result Value Ref Range Status   Specimen Description BLOOD RIGHT ARM  Final   Special Requests BOTTLES DRAWN AEROBIC AND ANAEROBIC 5ML  Final   Culture NO GROWTH 5 DAYS  Final   Report Status 09/16/2015 FINAL  Final  Blood culture (routine x 2)     Status: None   Collection Time: 09/11/15 10:55 AM  Result Value Ref Range Status   Specimen Description BLOOD RIGHT HAND  Final   Special Requests BOTTLES DRAWN AEROBIC AND ANAEROBIC 10ML  Final   Culture NO GROWTH 5 DAYS  Final   Report Status 09/16/2015 FINAL  Final  MRSA PCR  Screening     Status: None   Collection Time: 09/11/15  2:20 PM  Result Value Ref Range Status   MRSA by PCR NEGATIVE NEGATIVE Final    Comment:        The GeneXpert MRSA Assay (FDA approved for NASAL specimens only), is one component of a comprehensive MRSA colonization surveillance program. It is not intended to diagnose MRSA infection nor to guide or monitor treatment for MRSA infections.   Culture, respiratory (NON-Expectorated)     Status: None   Collection Time: 09/12/15  4:52 AM  Result Value Ref Range Status   Specimen Description TRACHEAL ASPIRATE  Final   Special Requests NONE  Final   Gram Stain   Final    ABUNDANT WBC PRESENT,BOTH PMN AND MONONUCLEAR RARE SQUAMOUS EPITHELIAL CELLS PRESENT NO ORGANISMS SEEN Performed at [facility]    Culture   Final    RARE YEAST CONSISTENT WITH CANDIDA SPECIES Performed at [facility]    Report Status 09/14/2015 FINAL  Final    Coagulation Studies: No results for input(s): LABPROT, INR in the last 72 hours.  Urinalysis: No results for input(s): COLORURINE, LABSPEC, PHURINE, GLUCOSEU, HGBUR, BILIRUBINUR, KETONESUR, PROTEINUR, UROBILINOGEN, NITRITE, LEUKOCYTESUR in the last 72 hours.  Invalid input(s): APPERANCEUR    Imaging: Dg Chest Port 1 View  09/18/2015  CLINICAL DATA:  Pneumonia, shortness of breath. EXAM: PORTABLE CHEST 1 VIEW COMPARISON:  09/15/2015 FINDINGS: Endotracheal tube is in stable position, 3 cm superior to the carina. Right PICC line terminates at the expected location of cavoatrial junction. Large bore central venous catheter versus sheath from right internal jugular approach is stable in appearance, terminating within the expected location of superior vena cava. Feeding catheter is partially visualized, excluded by collimation. Cardiomediastinal silhouette is normal. Mediastinal contours appear intact. There is no evidence of pleural effusion or pneumothorax. Lower lung fields excluded by  collimation, but there is improved aeration of the visualized portions of the lungs. Osseous structures are without acute abnormality. Soft tissues are grossly normal.  IMPRESSION: Improved aeration of the visualized portions of the lungs. Lines and tubes as above. Electronically Signed   By: Ted Mcalpine M.D.   On: 09/18/2015 08:54     Medications:   . sodium chloride 10 mL/hr at 09/17/15 2257  . heparin 10,000 units/ 20 mL infusion syringe Stopped (09/17/15 0600)  . dialysis replacement fluid (prismasate) 200 mL/hr at 09/17/15 1745  . dialysis replacement fluid (prismasate) 300 mL/hr at 09/17/15 1400  . dialysate (PRISMASATE) 2,000 mL/hr at 09/18/15 1015   . acetaminophen (TYLENOL) oral liquid 160 mg/5 mL  650 mg Oral 4 times per day  . antiseptic oral rinse  7 mL Mouth Rinse Q2H  . [START ON 09/19/2015] aspirin  81 mg Oral Daily  . atorvastatin  80 mg Oral q1800  . chlorhexidine gluconate  15 mL Mouth Rinse BID  . cloNIDine  0.1 mg Oral TID  . [START ON 09/19/2015] clopidogrel  75 mg Oral Daily  . insulin aspart  0-9 Units Subcutaneous Q4H  . ipratropium-albuterol  3 mL Nebulization Q6H  . metoprolol tartrate  12.5 mg Oral BID  . OLANZapine  5 mg Oral QHS  . pantoprazole sodium  40 mg Oral BID  . terazosin  10 mg Oral QHS   fentaNYL (SUBLIMAZE) injection, heparin, heparin, ondansetron (ZOFRAN) IV, senna, sodium chloride  Assessment/ Plan:  1. AKI in CKD4: Nephrologist is Dr. Darrick Penna. Baseline creatinine around 4. On admission Cr was at baseline but has continued to worsen. He is now oliguric. He has been given Lasix and metolazone without much UOP. Believe this is ATN with progression of renal disease.  -Scr was worsening and CRRT started 11/16 --- continues  -monitor I/Os and daily weights -trend labs - CRRT initiated and removal of 3.5  L ultrafiltrate yesterday -avoid nephrotoxic agents and contrast  - -palliative consult to helps with goals of care Discussed  with daughter [name] ( she appears prepared and realistic )   2. Dyspnea: patient was admitted to ICU with acute hypoxic respiratory failure due to pulmonary edema +/- CAP. He has decompensated diastolic CHF. Lactic acidosis on admission to 6.69 which has resolved. -Pulmonology following - was on rocephin and azithromycin  These have been stopped  3. NSTEMI: Troponins continued to rise on admission qualifying for NSTEMI.  -cardiology following Considering Right heart catheterization -holding off on cath in setting of possible HD - Heparin started with CRRT  4. Aneima: of chronic disease along with acute blood loss anemia from GI bleed(positive stool occult). S/p 1 u PRBCs.  -on PPI -GI following; unstable for procedure  Discussed condition withDeb ( daughter ) Very little progress although condition is stable. CXR improved but suspect not much additional fluid can be removed. I think that extubation with no plans for reintubation should be considered. I discussed this with [name] and Dr Craige Cotta. The are in agreement   LOS: 7 [name]  :19 AM

## 2015-09-18 NOTE — Progress Notes (Signed)
Subjective:  Intubated, sedated  . acetaminophen (TYLENOL) oral liquid 160 mg/5 mL  650 mg Oral 4 times per day  . antiseptic oral rinse  7 mL Mouth Rinse Q2H  . aspirin  81 mg Per Tube Daily  . atorvastatin  80 mg Per Tube q1800  . chlorhexidine gluconate  15 mL Mouth Rinse BID  . cloNIDine  0.1 mg Per Tube TID  . clopidogrel  75 mg Per Tube Daily  . insulin aspart  0-9 Units Subcutaneous Q4H  . ipratropium-albuterol  3 mL Nebulization Q6H  . metoprolol tartrate  12.5 mg Per Tube BID  . OLANZapine  5 mg Per Tube QHS  . pantoprazole sodium  40 mg Per Tube BID  . terazosin  10 mg Per Tube QHS   . sodium chloride 10 mL/hr at 09/17/15 2257  . feeding supplement (VITAL AF 1.2 CAL) 1,000 mL (09/17/15 1909)  . fentaNYL infusion INTRAVENOUS 75 mcg/hr (09/17/15 2327)  . heparin 10,000 units/ 20 mL infusion syringe Stopped (09/17/15 0600)  . dialysis replacement fluid (prismasate) 200 mL/hr at 09/17/15 1745  . dialysis replacement fluid (prismasate) 300 mL/hr at 09/17/15 1400  . dialysate (PRISMASATE) 2,000 mL/hr at 09/18/15 0704    Objective:  Vital Signs in the last 24 hours: Temp:  [97.3 F (36.3 C)-97.9 F (36.6 C)] 97.9 F (36.6 C) (11/20 0400) Pulse Rate:  [62-88] 86 (11/20 0700) Resp:  [13-20] 13 (11/20 0700) BP: (78-142)/(33-98) 118/33 mmHg (11/20 0700) SpO2:  [90 %-100 %] 100 % (11/20 0700) FiO2 (%):  [40 %] 40 % (11/20 0600) Weight:  [137 lb (62.143 kg)] 137 lb (62.143 kg) (11/20 0500)  Intake/Output from previous day: 11/19 0701 - 11/20 0700 In: 2143.3 [I.V.:418.3; NG/GT:1475; IV Piggyback:250] Out: 3243 [Urine:10]  Physical Exam: NAD HEENT: normal Neck: supple Lungs: decreased breath sounds CV: RRR without murmur or gallop Abd: soft, NT, Positive BS, no hepatomegaly Ext: no C/C/E, distal pulses intact and equal Skin: warm/dry no rash  Lab Results:  Recent Labs  09/17/15 1550 09/18/15 0420  WBC 8.4 13.1*  HGB 8.7* 8.8*  PLT 137* 130*    Recent  Labs  09/17/15 1550 09/18/15 0420  NA 137 137  K 5.1 5.2*  CL 102 102  CO2 30 29  GLUCOSE 81 131*  BUN 19 21*  CREATININE 1.19 1.29*   No results for input(s): TROPONINI in the last 72 hours.  Invalid input(s): CK, MB  Cardiac Studies: Echo 09/11/2015: LV EF 65-70%, mild LVH, grade 1 diastolic dysfunction, PA peak pressure 65 mmHg, trivial pericardial effusion  Echo 09/12/2015: Normal systolic function, normal wall motion, no regional wall motion abnormalities.  Tele: NSR, PVC   Assessment/Plan:  81 year old man with history of DM2, HTN, HLD, PVD, CAD, CKD stage 4, carotid artery disease, chronic anemia presenting with dyspnea x 2 days.   NSTEMI, CAD: Troponin 6.15 on admission up to 18.51. Downtrending. No wall motion abnormalities and LV EF 65-70% on echo 11/13. Repeat EKG with sinus rhythm, mild depression in II, aVF unchanged from previous EKG. Limited echo with normal systolic function and no regional wall motion abnormalities. Heparin gtt discontinued 11/15 as his troponin was downtrending/MI resolving. -Invasive interventions on hold for now per family wishes. -ASA 81mg  daily -Metoprolol 12.5mg  BID per tube. -Atorvastatin 80mg  daily. -Plavix 75mg  daily  Acute diastolic CHF: BNP 2200 and weight 155 on admission. CVP 5 last night. Initiated on CRRT 11/16. 10mL urine output, 3.3L removed with CRRT and and overall -7.8L  since admission. Weight down to 137 from 140. -CRRT per renal.   CAP, septic shock, acute hypoxic respiratory failure: CXR on admission with bilateral mid to lower lung zone airspace opacities. Intubated on 11/13 and self extubated 11/15. Lactic acidosis on admission to 6.69 which has resolved. Reintubated 11/16 for worsening respiratory status. -Completed 7 day course of Rocephin and azithromycin  Acute on chronic anemia, GI bleed: Hgb 7.5 on admission and received 1u pRBC on 11/13. Hgb 8.3 post transfusion. Hgb down to 6.7 11/15 and received 1 u pRBC. Hgb  stable at 8.6 this morning. -PPI  Acute on CKD stage 4: Nephrologist is Dr. Darrick Penna. Baseline creatinine around 4. Creatinine on admission 3.82. Started on CRRT 11/16 due to volume overload in the setting of oliguria and worsening renal function. ATN and progression of renal disease. -Nephrology following -CRRT. Does not want long term HD.  DM2: -SSI  Griffin Basil, M.D. 09/18/2015, 7:23 AM   Patient seen with resident, agree with the above note.  CVP 4.  Condition has not appreciably changed from yesterday.  Poor prognosis.  Plan at this point is ongoing discussion with family regarding comfort care/extubation.    Marca Ancona 09/18/2015 9:18 AM

## 2015-09-18 NOTE — Progress Notes (Signed)
I spoke with pt's family.  Discuss that he would need to have new HD catheter placed if they wished to continue dialysis.  They were very clear that he would not want to continue with HD, and would not want new dialysis catheter placed.  Explained that in this situation best option is to shift focus of care to maintaining his comfort.  Family was asking about hospice care >> will have palliative care f/u with pt's family on 11/21.  Discussed that might be best to transfer him to floor bed to allow easier access for family members.  Coralyn HellingVineet Zarra Geffert, MD [facility] 09/18/2015, 2:07 PM Pager:  [phone] After 3pm call: [phone]

## 2015-09-18 NOTE — Progress Notes (Signed)
Report received from Lusbyeresa,Rn. Patient Fentanyl and tube feeding on hold. CRRT in process with increased arterial pressures System clotted. Noted Right Dialysis cath suture out and catheter not in proper place Dr. Hyman HopesWebb and Dr Craige CottaSood  Notified. Catheter removed as per ordered.

## 2015-09-18 NOTE — Care Management Important Message (Signed)
Important Message  Patient Details  Name: [name] MRN: [id number] Date of Birth: 04/15/1930   Medicare Important Message Given:  Yes    Suanne Minahan P Dynisha Due 09/18/2015, 9:50 AM

## 2015-09-18 NOTE — Procedures (Signed)
Extubation Procedure Note  Patient Details:   Name: [name] DOB: 03/01/1930 MRN: [id number]   Airway Documentation:  Airway 7.5 mm (Active)  Secured at (cm) 22 cm 09/18/2015  7:53 AM  Measured From Lips 09/18/2015  7:53 AM  Secured Location Right 09/18/2015  7:53 AM  Secured By [employer] Tube Holder 09/18/2015  7:53 AM  Tube Holder Repositioned Yes 09/18/2015  7:53 AM  Cuff Pressure (cm H2O) 28 cm H2O 09/18/2015  3:33 AM  Site Condition Dry 09/18/2015  7:53 AM  Pt extubated to 4lpm [place], no distress noted.   Evaluation  O2 sats: stable throughout Complications: No apparent complications Patient did tolerate procedure well. Bilateral Breath Sounds: Diminished Suctioning: Airway Yes  Kele Barthelemy Tobi BastosM Osmar Howton 09/18/2015, 11:48 AM

## 2015-09-18 NOTE — Progress Notes (Signed)
200mL fentanyl wasted in sink with Noah CharonPenny Rylah Fukuda, RN.

## 2015-09-19 DIAGNOSIS — J9690 Respiratory failure, unspecified, unspecified whether with hypoxia or hypercapnia: Secondary | ICD-10-CM | POA: Insufficient documentation

## 2015-09-19 MED ORDER — POLYETHYLENE GLYCOL 3350 17 G PO PACK
17.0000 g | PACK | Freq: Every day | ORAL | Status: DC | PRN
  Administered 2015-09-23: 17 g via ORAL
  Filled 2015-09-19 (×2): qty 1

## 2015-09-19 MED ORDER — HALOPERIDOL LACTATE 5 MG/ML IJ SOLN: 0.5000 mg | INTRAMUSCULAR | Status: DC | PRN

## 2015-09-19 MED ORDER — ACETAMINOPHEN 325 MG PO TABS: 650.0000 mg | ORAL_TABLET | Freq: Four times a day (QID) | ORAL | Status: DC | PRN

## 2015-09-19 MED ORDER — CLOPIDOGREL BISULFATE 75 MG PO TABS
75.0000 mg | ORAL_TABLET | Freq: Every day | ORAL | Status: DC
  Administered 2015-09-19 – 2015-09-24 (×6): 75 mg via ORAL
  Filled 2015-09-19 (×6): qty 1

## 2015-09-19 MED ORDER — ALBUTEROL SULFATE (2.5 MG/3ML) 0.083% IN NEBU: 2.5000 mg | INHALATION_SOLUTION | RESPIRATORY_TRACT | Status: DC | PRN

## 2015-09-19 MED ORDER — IPRATROPIUM-ALBUTEROL 0.5-2.5 (3) MG/3ML IN SOLN
3.0000 mL | Freq: Four times a day (QID) | RESPIRATORY_TRACT | Status: DC
  Administered 2015-09-19 – 2015-09-20 (×3): 3 mL via RESPIRATORY_TRACT
  Filled 2015-09-19 (×4): qty 3

## 2015-09-19 NOTE — Progress Notes (Signed)
Nutrition Brief Note  Chart reviewed. Pt now transitioning to comfort care.  No further nutrition interventions warranted at this time.  Please re-consult as needed.   Xavius Spadafore A. Makyiah Lie, RD, LDN, CDE Pager: [phone] After hours Pager: [phone]  

## 2015-09-19 NOTE — Progress Notes (Signed)
PULMONARY / CRITICAL CARE MEDICINE   Name: [name] MRN: [id number] DOB: 1930-08-15    ADMISSION DATE:  09/11/2015 CONSULTATION DATE:  09/11/2015  REFERRING MD :  [name]   CHIEF COMPLAINT:  Acute Respiratory Failure   STUDIES:  ECHO 11/13 >> EF 65 to 70%, grade 1 diastolic dysfx, PAS 65 mmHg . SIGNIFICANT EVENTS: 11/13 - Admit  11/13 - Intubation  11/15 - Self-extubation 11/16 - Reintubation & HD catheter placement for CVVHD 11/20 CRRT stopped/ no further aggressive care. Extubated.   PROCEDURES R Subclavian CVC 11/13>>> R IJ HD Catheter 11/16>>> SUBJECTIVE:  Appears comfortable.   VITAL SIGNS: Temp:  [97.7 F (36.5 C)-99.6 F (37.6 C)] 97.7 F (36.5 C) (11/21 0102) Pulse Rate:  [79-92] 87 (11/21 0608) Resp:  [12-24] 16 (11/21 0608) BP: (110-168)/(26-110) 168/44 mmHg (11/21 0608) SpO2:  [91 %-100 %] 98 % (11/21 0608) FiO2 (%):  [40 %] 40 % (11/20 1000) Weight:  [62.6 kg (138 lb 0.1 oz)] 62.6 kg (138 lb 0.1 oz) (11/20 1840) HEMODYNAMICS:   VENTILATOR SETTINGS: Vent Mode:  [-]  FiO2 (%):  [40 %] 40 % INTAKE / OUTPUT:  Intake/Output Summary (Last 24 hours) at 09/19/15 0930 Last data filed at 09/19/15 0608  Gross per 24 hour  Intake  102.5 ml  Output    340 ml  Net -237.5 ml    PHYSICAL EXAMINATION: General: somnolent, no distress   Neuro: opens eyes, speaks in one word phrase HEENT: NCAT, mm dry  Cardiac: regular Chest: faint b/l crackles, no accessory muscle use  Abd: soft, non tender Ext: no edema Skin: no rashes  LABS:  CBC  Recent Labs Lab 09/17/15 0410 09/17/15 1550 09/18/15 0420  WBC 11.0* 8.4 13.1*  HGB 8.6* 8.7* 8.8*  HCT 26.6* 27.5* 27.0*  PLT 146* 137* 130*   Coag's  Recent Labs Lab 09/17/15 0700 09/17/15 2154 09/18/15 0420  APTT >200* 31 30     BMET  Recent Labs Lab 09/17/15 0410 09/17/15 1550 09/18/15 0420  NA 139 137 137  K 4.7 5.1 5.2*  CL 103 102 102  CO2 BUN 24* 19 21*  CREATININE 1.37* 1.19  1.29*  GLUCOSE 131* 81 131*   Electrolytes  Recent Labs Lab 09/16/15 0420  09/17/15 0410 09/17/15 1550 09/18/15 0420  CALCIUM 7.8*  < > 8.1* 8.3* 8.0*  MG 2.6*  --  2.6*  --  2.4  PHOS 4.1  < > 2.2* 2.6 1.9*  < > = values in this interval not displayed.   Sepsis Markers  Recent Labs Lab 09/12/15 0945 09/13/15 0625 09/15/15 0400  LATICACIDVEN 1.1 0.9  --   PROCALCITON  --  8.84 3.04   ABG  Recent Labs Lab 09/14/15 1452 09/14/15 2350  PHART 7.401 7.401  PCO2ART 39.3 42.3  PO2ART 60.0* 47.0*   Liver Enzymes  Recent Labs Lab 09/17/15 0410 09/17/15 1550 09/18/15 0420  ALBUMIN 2.2* 2.4* 2.2*     Cardiac Enzymes  Recent Labs Lab 09/13/15 1115  TROPONINI 11.65*   Glucose  Recent Labs Lab 09/17/15 1557 09/17/15 1939 09/17/15 2333 09/18/15 0424 09/18/15 0836 09/18/15 1215  GLUCAP 79 123* 145* 132* 145* 115*    Imaging No results found.  ASSESSMENT / PLAN: 81 y/o male w/ stage IV CKD , HTN and CAD. Presents to the ED on 11/13 w/ acute resp failure in setting of acute pulmonary edema, hypertensive crisis and NSTEMI +/- CAP CCM asked to assess given degree of respiratory  failure and ongoing ischemia w/ concern the pt would require intubation. Was moved to the intensive care. Intubated for acute respiratory failure in setting of active ischemia. Treated aggressively w/ IV diuresis. Developed oliguria and progressive renal failure and CRRT was started. He remained oliguric. No significant improvement over course of his ICU stay. Based on the patient's wishes the family opted for extubation and discontinuation of dialysis. He has since been moved to the medical ward w/ comfort oriented care. Long discussion w/ family. There was initially a disconnect between what we were telling them and what they understood the plan to be. They though [name] was getting better & were hoping to transition to dialysis and rehab efforts. They realized that this was not the case  when a friend was wishing them otherwise. After a prolonged discussion w/ the daughter and the son at bedside we explored Bryan. Hali Duran's current state. We discussed that he had already been at peace w/ the concept of dying as he and I had discussed this prior to intubation and he stated "he was ready for heaven and did not want to do therapies which would only prolong his life". We explored his quality of life prior to his acute hospitalization and the likely chronically ill state of health to expect if we transition to long term dialysis if, assuming he could tolerate it. Based on the families understanding of the patient's wishes they did come to the conclusion that they desired to continue comfort based care. We discussed the likelihood that [name] will continue to slip into a deeper sleep and eventually Coma. They voiced their concern to ensure his comfort. He will continue palliative goals of care.   1) Acute respiratory failure 2nd to pulmonary edema and possible pneumonia (all cultures negative): Completed abx. Oxygen, PRN morphine. Little else to offer given renal failure  2) NSTEMI with hx of CAD&HTN emergency >> resolved: Lipitor, ASA, catapres, plavix, lopressor, hytrin 3) AKI: No further labs 4)  Heme + stools: PPI  5) protein calorie malnutrition: diet as tolerated if able 6) Anemia of chronic disease: limit lab work, no further x-fusions.  7) H/O DM - BG well controlled: Accu-Checks q4hr Low dose SSI Protocol  30 minutes of extended time w/ family discussion.   [name] ACNP-BC [facility] Pager # [phone] OR # [phone] if no answer

## 2015-09-19 NOTE — Progress Notes (Signed)
Patient Name: [name] Date of Encounter: 09/19/2015  Principal Problem:   Sepsis due to pneumonia [facility]) Active Problems:   CAD minor CAD in '02. Myoview low risk 9/11   Hypertensive urgency   CKD (chronic kidney disease) stage 4, GFR 15-29 ml/min (HCC)   Hypercholesteremia   Uncontrolled diabetes mellitus (HCC)   NSTEMI (non-ST elevated myocardial infarction) (HCC)   Acute respiratory failure with hypoxia (HCC)   Symptomatic anemia   Heme positive stool   CAP (community acquired pneumonia)   Acute hypokalemia   Acute respiratory failure (HCC)   Lactic acidosis   Acute diastolic CHF (congestive heart failure), NYHA class 4 (HCC)   Acute congestive heart failure (HCC)   Absolute anemia   Bleeding gastrointestinal   Acute on chronic renal failure (HCC)   DNR (do not resuscitate)   Primary Cardiologist: Dr. Allyson Sabal Patient Profile: 81 yo male w/ PMH of mild CAD, Stage 4 CKD, HLD, and Type 2 DM who presented to [facility] ED on 09/11/2015 for worsening dyspnea and acute respiratory failure. Found to have NSTEMI with troponin peak of 18.51 on 09/12/2015.  SUBJECTIVE: Not responding to questions. Moving about in bed. Patient's son says the family reached agreement about him being comfort care only.  OBJECTIVE Filed Vitals:   09/18/15 1840 09/18/15 1858 09/18/15 2110 09/19/15 0608  BP:  138/26 148/37 168/44  Pulse:  92 88 87  Temp:  99.6 F (37.6 C) 99.1 F (37.3 C) 97.7 F (36.5 C)  TempSrc:  Oral Oral Oral  Resp:  Height:  (1.676 m)     Weight: 138 lb 0.1 oz (62.6 kg)     SpO2:  100% 91% 98%    Intake/Output Summary (Last 24 hours) at 09/19/15 1157 Last data filed at 09/19/15 1043  Gross per 24 hour  Intake     30 ml  Output    275 ml  Net   -245 ml   Filed Weights   09/17/15 0400 09/18/15 0500 09/18/15 1840  Weight: 140 lb 12.8 oz (63.866 kg) 137 lb (62.143 kg) 138 lb 0.1 oz (62.6 kg)    PHYSICAL EXAM General: Elderly, frail Caucasian  male in no acute distress. Head: Normocephalic, atraumatic.  Neck: Supple without bruits, JVD not elevated. Lungs:  Resp regular and unlabored, Rales at bases bilaterally. Heart: RRR, S1, S2, no S3, S4, 2/6 SEM; no rub. Abdomen: Soft, non-tender, non-distended with normoactive bowel sounds. No hepatomegaly. No rebound/guarding. No obvious abdominal masses. Extremities: No clubbing, cyanosis, or edema. Distal pedal pulses are 2+ bilaterally. Neuro: Alert and oriented X 3. Moves all extremities spontaneously. Psych: Normal affect.   LABS: CBC: Recent Labs  09/17/15 1550 09/18/15 0420  WBC 8.4 13.1*  NEUTROABS 6.6 10.4*  HGB 8.7* 8.8*  HCT 27.5* 27.0*  MCV 92.9 93.4  PLT 137* 130*   INR:No results for input(s): INR in the last 72 hours. Basic Metabolic Panel: Recent Labs  09/17/15 0410 09/17/15 1550 09/18/15 0420  NA 139 137 137  K 4.7 5.1 5.2*  CL 103 102 102  CO2 GLUCOSE 131* 81 131*  BUN 24* 19 21*  CREATININE 1.37* 1.19 1.29*  CALCIUM 8.1* 8.3* 8.0*  MG 2.6*  --  2.4  PHOS 2.2* 2.6 1.9*   Liver Function Tests: Recent Labs  09/17/15 1550 09/18/15 0420  ALBUMIN 2.4* 2.2*   BNP:  B NATRIURETIC PEPTIDE  Date/Time Value Ref Range Status  09/11/2015 09:55 AM 2200.4* 0.0 - 100.0 pg/mL Final   TELE:  Not currently on telemetry      ECHO: 09/11/2015 Study Conclusions - Left ventricle: The cavity size was normal. Wall thickness was increased in a pattern of mild LVH. Systolic function was vigorous. The estimated ejection fraction was in the range of 65% to 70%. Doppler parameters are consistent with abnormal left ventricular relaxation (grade 1 diastolic dysfunction). - Left atrium: The atrium was mildly dilated. - Pulmonary arteries: PA peak pressure: 65 mm Hg (S). - Pericardium, extracardiac: A trivial pericardial effusion was identified.  Study Conclusions: 09/12/2015 - Left ventricle: Global longitudinal LV strain is -17.9%  The cavity size was normal. There was mild concentric hypertrophy. Systolic function was normal. Wall motion was normal; there were no regional wall motion abnormalities. - Aortic valve: Trileaflet; mildly thickened, mildly calcified leaflets. - Mitral valve: Mobility was not restricted. - Pericardium, extracardiac: A trivial pericardial effusion was identified posterior to the heart.  Radiology/Studies: Dg Chest Port 1 View:09/18/2015  CLINICAL DATA:  Pneumonia, shortness of breath. EXAM: PORTABLE CHEST 1 VIEW COMPARISON:  09/15/2015 FINDINGS: Endotracheal tube is in stable position, 3 cm superior to the carina. Right PICC line terminates at the expected location of cavoatrial junction. Large bore central venous catheter versus sheath from right internal jugular approach is stable in appearance, terminating within the expected location of superior vena cava. Feeding catheter is partially visualized, excluded by collimation. Cardiomediastinal silhouette is normal. Mediastinal contours appear intact. There is no evidence of pleural effusion or pneumothorax. Lower lung fields excluded by collimation, but there is improved aeration of the visualized portions of the lungs. Osseous structures are without acute abnormality. Soft tissues are grossly normal. IMPRESSION: Improved aeration of the visualized portions of the lungs. Lines and tubes as above. Electronically Signed   By: Ted Mcalpine M.D.   On: 09/18/2015 08:54     Current Medications:  . dextrose 5 % 100 mL with ferumoxytol (FERAHEME) 510 mg infusion   Intravenous Once      ASSESSMENT AND PLAN:  1. NSTEMI, CAD:  - Troponin peaked at 18.51 on 09/12/2015.  - No wall motion abnormalities and LV EF 65-70% on echo 11/13. No wall motion abnormalities and LV EF 65-70% on echo 11/13. Repeat EKG with sinus rhythm, mild depression in II, aVF unchanged from previous EKG. Limited echo with normal systolic function and no regional wall  motion abnormalities. Heparin gtt discontinued 11/15 as his troponin was downtrending/MI resolving. -Invasive interventions on hold for now per family wishes. Patient is now comfort based care only. -Was on ASA, BB, Statin, and Plavix. All medications have now been discontinued.  2. Acute diastolic CHF:  - echo on 09/11/2015 showed EF of 65% to 70% with Grade 1 Diastolic Dysfunction.  - BNP 2200 and weight 155 on admission.  - weight 138lbs on 09/18/2015.  3. CAP, septic shock, acute hypoxic respiratory failure:  - CXR on admission with bilateral mid to lower lung zone airspace opacities. Intubated on 11/13 and self extubated 11/15. Lactic acidosis on admission to 6.69 which has resolved. Reintubated 11/16 for worsening respiratory status. -Completed 7 day course of Rocephin and azithromycin. - Comfort Care only.  4. Acute on chronic anemia, GI bleed:  - Hgb 7.5 on admission and received 1u pRBC on 11/13. Hgb 8.3 post transfusion. Hgb down to 6.7 11/15 and received 1 u pRBC.  - Hgb 8.8 on 09/18/2015.  - No further transfusions  5. Acute on CKD stage  4: -  Nephrologist is Dr. Darrick Pennaeterding.  - Baseline creatinine around 4. Creatinine on admission 3.82. 1.29 on 09/18/2015. - Started on CRRT 11/16 due to volume overload in the setting of oliguria and worsening renal function. ATN and progression of renal disease. Does not want long term HD. -Nephrology following   Cardiology can likely sign-off today, as patient is now comfort care only. All medications have been stopped. No further lab draws will take place. Expressed our condolences to the family. Informed them we were available for questions if needed.   Lorri FrederickSigned, Brittany M Strader , PA-C 11:57 AM 09/19/2015 Pager: [phone]  Notes reviewed. Pt is now comfort care only. Please let us know if anything needed from cardiac perspective. Pt not seen.  Tonny BollmanCooper, [place]aceland Wachter 09/19/2015 1:58 PM

## 2015-09-19 NOTE — Progress Notes (Signed)
Dramatic improvement this afternoon- patient alert and oriented- asking for food and "diet pepsi". I discussed with patient and family possible trajectory- advised them to continue to focus on quality time and we would address his condition and needs daily to see which direction we needed to go= we all agreed that DNR remains appropriate but he is not opposed to continued medical interventions for reversible disease.  Will follow daily.   Anderson MaltaElizabeth Golding, DO Palliative Medicine

## 2015-09-20 MED ORDER — IPRATROPIUM-ALBUTEROL 0.5-2.5 (3) MG/3ML IN SOLN
3.0000 mL | Freq: Two times a day (BID) | RESPIRATORY_TRACT | Status: DC
Start: 2015-09-20 — End: 2015-09-24
  Administered 2015-09-20 – 2015-09-24 (×8): 3 mL via RESPIRATORY_TRACT
  Filled 2015-09-20 (×8): qty 3

## 2015-09-20 MED ORDER — ASPIRIN 81 MG PO CHEW: 81.0000 mg | CHEWABLE_TABLET | Freq: Every day | ORAL | Status: DC

## 2015-09-20 MED ORDER — HEPARIN SODIUM (PORCINE) 5000 UNIT/ML IJ SOLN
5000.0000 [IU] | Freq: Three times a day (TID) | INTRAMUSCULAR | Status: DC
  Administered 2015-09-20: 5000 [IU] via SUBCUTANEOUS
  Filled 2015-09-20: qty 1

## 2015-09-20 NOTE — Evaluation (Signed)
Physical Therapy Evaluation Patient Details Name: [name] MRN: [id number] DOB: 02/02/1930 Today's Date: 09/20/2015   History of Present Illness  Patient is a 81 y/o male presents with w/ acute resp failure in setting of acute pulmonary edema, hypertensive crisis and NSTEMI, CAP. Intubated 11/13-11/15. Decided on comfort care, however pt with improvement 11/22.  PMH includes stage IV CKD , HTN and CAD.  Clinical Impression  Patient presents with functional limitations due to deficits listed in PT problem list (see below). Pt tolerated sitting EOB and standing with Max A. Fatigues easily and reports dizziness most likely due to hypotension (BP not assessed). PT eager to get to chair next session as tolerated. Education re: exercises to perform during the day, sitting EOB multiple times per day to improve upright tolerance, rehab etc. Pt independent PTA. Would benefit from ST SNF to maximize independence and mobility prior to return home.    Follow Up Recommendations SNF    Equipment Recommendations  Rolling walker with 5" wheels    Recommendations for Other Services       Precautions / Restrictions Precautions Precautions: Fall Restrictions Weight Bearing Restrictions: No      Mobility  Bed Mobility Overal bed mobility: Needs Assistance Bed Mobility: Rolling;Sidelying to Sit;Sit to Sidelying;Sit to Supine Rolling: Supervision Sidelying to sit: Mod assist;HOB elevated     Sit to sidelying: Mod assist General bed mobility comments: MOd A to elevate trunk. Use of rail for support. Assist to bring BLEs into bed.   Transfers Overall transfer level: Needs assistance Equipment used: Rolling walker (2 wheeled) Transfers: Sit to/from Stand Sit to Stand: Max assist         General transfer comment: Max A to boost from EOB with cues for hand placement/technique. Trembling noted in BLEs due to weakness. + dizziness.  Ambulation/Gait Ambulation/Gait assistance:  (Deferred. )               Stairs            Wheelchair Mobility    Modified Rankin (Stroke Patients Only)       Balance Overall balance assessment: Needs assistance Sitting-balance support: Feet supported;Bilateral upper extremity supported Sitting balance-Leahy Scale: Fair Sitting balance - Comments: Initially required Min A for balance progressing to Min guard assist. Worked on core stability and upright posture as pt with weak neck extensors. Sat EOB ~15 minutes.   Standing balance support: During functional activity Standing balance-Leahy Scale: Poor Standing balance comment: Relient on RW and Mod A for support. Trembling noted in BLEs. UNsteady.                             Pertinent Vitals/Pain Pain Assessment: No/denies pain    Home Living Family/patient expects to be discharged to:: Private residence Living Arrangements: Children Available Help at Discharge: Family;Available PRN/intermittently (Daughter works) Type of Home: House Home Access: Stairs to enter Entrance Stairs-Rails: Right Entrance Stairs-Number of Steps: 4 Home Layout: Able to live on main level with bedroom/bathroom;Two level Home Equipment: None      Prior Function Level of Independence: Independent         Comments: Mows lawns.     Hand Dominance        Extremity/Trunk Assessment   Upper Extremity Assessment: Defer to OT evaluation           Lower Extremity Assessment: Generalized weakness         Communication   Communication: No  difficulties  Cognition Arousal/Alertness: Awake/alert Behavior During Therapy: WFL for tasks assessed/performed Overall Cognitive Status: Impaired/Different from baseline Area of Impairment: Orientation Orientation Level: Disoriented to;Time;Place                  General Comments General comments (skin integrity, edema, etc.): Son and daughter present in room during session.    Exercises General Exercises - Lower  Extremity Ankle Circles/Pumps: Both;10 reps;Supine Quad Sets: Both;10 reps;Supine Straight Leg Raises: Both;5 reps;Supine Hip Flexion/Marching: Both;10 reps;Supine      Assessment/Plan    PT Assessment Patient needs continued PT services  PT Diagnosis Difficulty walking;Generalized weakness   PT Problem List Decreased strength;Decreased activity tolerance;Decreased cognition;Decreased balance;Decreased mobility  PT Treatment Interventions Balance training;Therapeutic activities;Therapeutic exercise;Functional mobility training;Patient/family education;Gait training   PT Goals (Current goals can be found in the Care Plan section) Acute Rehab PT Goals Patient Stated Goal: to go home PT Goal Formulation: With patient/family Time For Goal Achievement: 10/04/15 Potential to Achieve Goals: Good    Frequency Min 2X/week   Barriers to discharge Decreased caregiver support Pt home alone during the day    Co-evaluation               End of Session Equipment Utilized During Treatment: Gait belt;Oxygen Activity Tolerance: Patient limited by fatigue Patient left: in bed;with call bell/phone within reach;with family/visitor present;with SCD's reapplied Nurse Communication: Mobility status         Time: 1610-9604 PT Time Calculation (min) (ACUTE ONLY): 36 min   Charges:   PT Evaluation $Initial PT Evaluation Tier I: 1 Procedure PT Treatments $Therapeutic Activity: 8-22 mins   PT G Codes:        [name] 09/20/2015, 4:31 PM Mylo Red, PT, DPT [phone]

## 2015-09-20 NOTE — Clinical Social Work Placement (Signed)
   CLINICAL SOCIAL WORK PLACEMENT  NOTE  Date:  09/20/2015  Patient Details  Name: [name] MRN: [id number] Date of Birth: 10/28/1930  Clinical Social Work is seeking post-discharge placement for this patient at the Skilled  Nursing Facility level of care (*CSW will initial, date and re-position this form in  chart as items are completed):  Yes   Patient/family provided with [place] Clinical Social Work Department's list of facilities offering this level of care within the geographic area requested by the patient (or if unable, by the patient's family).  Yes   Patient/family informed of their freedom to choose among providers that offer the needed level of care, that participate in Medicare, Medicaid or managed care program needed by the patient, have an available bed and are willing to accept the patient.  Yes   Patient/family informed of [facility]'s ownership interest in [facility] and [facility], as well as of the fact that they are under no obligation to receive care at these facilities.  PASRR submitted to EDS on 09/20/15     PASRR number received on 09/20/15     Existing PASRR number confirmed on       FL2 transmitted to all facilities in geographic area requested by pt/family on 09/20/15     FL2 transmitted to all facilities within larger geographic area on       Patient informed that his/her managed care company has contracts with or will negotiate with certain facilities, including the following:            Patient/family informed of bed offers received.  Patient chooses bed at       Physician recommends and patient chooses bed at      Patient to be transferred to   on  .  Patient to be transferred to facility by       Patient family notified on   of transfer.  Name of family member notified:        PHYSICIAN Please sign FL2, Please sign DNR     Additional Comment:    _______________________________________________ Izora RibasHoloman, Chevon Fomby M,  LCSW 09/20/2015, 4:37 PM

## 2015-09-20 NOTE — NC FL2 (Signed)
[place] MEDICAID FL2 LEVEL OF CARE SCREENING TOOL     IDENTIFICATION  Patient Name: [name] Birthdate: April 23, 1930 Sex: male Admission Date (Current Location): 09/11/2015  [facility] and [place] Number: Producer, television/film/video and Address:  [facility], [address] [place], [place] [address]      Provider Number: [id number]  Attending Physician Name and Address:  Eddie North, MD  Relative Name and Phone Number:       Current Level of Care: Hospital Recommended Level of Care: Skilled Nursing Facility Prior Approval Number:    Date Approved/Denied:   PASRR Number: [id number]  Discharge Plan: SNF    Current Diagnoses: Patient Active Problem List   Diagnosis Date Noted  . Respiratory failure (HCC)   . DNR (do not resuscitate)   . Acute on chronic renal failure (HCC)   . Lactic acidosis   . Acute diastolic CHF (congestive heart failure), NYHA class 4 (HCC)   . Acute congestive heart failure (HCC)   . Absolute anemia   . Bleeding gastrointestinal   . NSTEMI (non-ST elevated myocardial infarction) (HCC) 09/11/2015  . Acute respiratory failure with hypoxia (HCC) 09/11/2015  . Sepsis due to pneumonia (HCC) 09/11/2015  . Symptomatic anemia 09/11/2015  . Heme positive stool 09/11/2015  . CAP (community acquired pneumonia) 09/11/2015  . Acute hypokalemia 09/11/2015  . Acute respiratory failure (HCC) 09/11/2015  . Calculus of gallbladder without cholecystitis without obstruction   . Cholecystostomy care (HCC)   . Abnormal liver function tests   . Acute cholecystitis   . Elevated liver enzymes   . Abdominal pain, acute, right upper quadrant   . Abdominal pain 03/21/2015  . Uncontrolled diabetes mellitus (HCC) 03/21/2015  . Epigastric pain 03/21/2015  . Claudication in peripheral vascular disease (HCC) 11/01/2014  . Peripheral arterial disease (HCC) 09/02/2013  . History of tobacco abuse, remote 07/24/2013  . Bradycardia 06/17/2013  .  Fatigue 06/17/2013  . CAD minor CAD in '02. Myoview low risk 9/11   . PVD LCE '05, known 60% RICA   . Hypertensive urgency   . CKD (chronic kidney disease) stage 4, GFR 15-29 ml/min (HCC)   . Hypercholesteremia     Orientation ACTIVITIES/SOCIAL BLADDER RESPIRATION    Self, Situation, Place  Family supportive Indwelling catheter O2 (As needed) (4L [place])  BEHAVIORAL SYMPTOMS/MOOD NEUROLOGICAL BOWEL NUTRITION STATUS      Continent    PHYSICIAN VISITS COMMUNICATION OF NEEDS Height & Weight Skin    Verbally   137 lbs. Normal          AMBULATORY STATUS RESPIRATION    Assist extensive O2 (As needed) (4L )      Personal Care Assistance Level of Assistance  Bathing, Dressing Bathing Assistance: Limited assistance   Dressing Assistance: Limited assistance      Functional Limitations Info                SPECIAL CARE FACTORS FREQUENCY  PT (By licensed PT)     PT Frequency: 5/wk             Additional Factors Info  Code Status, Allergies Code Status Info: DNR Allergies Info: labetalol           Current Medications (09/20/2015): Current Facility-Administered Medications  Medication Dose Route Frequency Provider Last Rate Last Dose  . 0.9 %  sodium chloride infusion   Intravenous PRN Coralyn Helling, MD      . acetaminophen (TYLENOL) tablet 650 mg  650 mg  Oral Q6H PRN Edsel PetrinElizabeth L Golding, DO      . albuterol (PROVENTIL) (2.5 MG/3ML) 0.083% nebulizer solution 2.5 mg  2.5 mg Nebulization Q2H PRN Edsel PetrinElizabeth L Golding, DO      . clopidogrel (PLAVIX) tablet 75 mg  75 mg Oral Daily Edsel PetrinElizabeth L Golding, DO   75 mg at 09/20/15 1004  . fentaNYL (SUBLIMAZE) injection 25 mcg  25 mcg Intravenous Q2H PRN Coralyn HellingVineet Sood, MD      . haloperidol lactate (HALDOL) injection 0.5 mg  0.5 mg Intravenous Q4H PRN Edsel PetrinElizabeth L Golding, DO      . ipratropium-albuterol (DUONEB) 0.5-2.5 (3) MG/3ML nebulizer solution 3 mL  3 mL Nebulization BID Alyson ReedyWesam G Yacoub, MD      . ondansetron (ZOFRAN) injection  4 mg  4 mg Intravenous Q6H PRN Calvert CantorSaima Rizwan, MD      . polyethylene glycol (MIRALAX / GLYCOLAX) packet 17 g  17 g Oral Daily PRN Edsel PetrinElizabeth L Golding, DO       Do not use this list as official medication orders. Please verify with discharge summary.  Discharge Medications:   Medication List    ASK your doctor about these medications        ACCU-CHEK FASTCLIX LANCETS Misc  Use to check blood sugar 2 times per day dx code E11.9     allopurinol 100 MG tablet  Commonly known as:  ZYLOPRIM  TAKE 1 TABLET (100 MG TOTAL) BY MOUTH AT BEDTIME.     amLODipine 10 MG tablet  Commonly known as:  NORVASC  Take 1 tablet by mouth every morning.     aspirin EC 81 MG tablet  Take 81 mg by mouth every evening.     atorvastatin 40 MG tablet  Commonly known as:  LIPITOR  Take 1 tablet by mouth daily.     calcitRIOL 0.25 MCG capsule  Commonly known as:  ROCALTROL  Take 0.25 mcg by mouth every morning.     cholecalciferol 1000 UNITS tablet  Commonly known as:  VITAMIN D  Take 1,000 Units by mouth 2 (two) times daily.     cloNIDine 0.1 MG tablet  Commonly known as:  CATAPRES  Take 1 tablet by mouth 3 (three) times daily.     clopidogrel 75 MG tablet  Commonly known as:  PLAVIX  TAKE 1 TABLET BY MOUTH DAILY     feeding supplement Liqd  Take 1 Container by mouth 3 (three) times daily between meals.     glucose blood test strip  Commonly known as:  ACCU-CHEK AVIVA PLUS  Use as instructed to check blood sugar 2 times per day dx code E11.9     hydrALAZINE 25 MG tablet  Commonly known as:  APRESOLINE  Take 3 tablets (75 mg total) by mouth every 8 (eight) hours.     hydrALAZINE 50 MG tablet  Commonly known as:  APRESOLINE  Take 50 mg by mouth 3 (three) times daily.     pantoprazole 40 MG tablet  Commonly known as:  PROTONIX  Take 1 tablet (40 mg total) by mouth daily.     polyethylene glycol packet  Commonly known as:  MIRALAX / GLYCOLAX  Take 17 g by mouth 2 (two) times daily.      Potassium Chloride ER 20 MEQ Tbcr  Take 1 tablet by mouth daily.     PRESERVISION AREDS PO  Take 2 tablets by mouth daily.     sitaGLIPtin 25 MG tablet  Commonly known as:  JANUVIA  Take 1 tablet (25 mg total) by mouth daily.     terazosin 10 MG capsule  Commonly known as:  HYTRIN  TAKE 1 CAPSULE (10 MG TOTAL) BY MOUTH AT BEDTIME.        Relevant Imaging Results:  Relevant Lab Results:  Recent Labs    Additional Information SS# [id number]  Izora Ribas, [place]

## 2015-09-20 NOTE — Progress Notes (Signed)
[facility] PROGRESS NOTE  [name] VWU:[id number] DOB: 06/13/1930 DOA: 09/11/2015 PCP: Lupita Raider, MD  Brief narrative 80 year old male with stage IV chronic kidney disease, hypertension, coronary artery disease, diabetes mellitus, hyperlipidemia, peripheral vascular disease who presented to the ED on 11/13 with acute respiratory failure with acute pulmonary edema, hypertensive crisis and NST EMI with community-acquired pneumonia. Patient was transferred to ICU and was intubated for acute respiratory failure with cardiac ischemia. He was aggressively diuresed but developed renal failure and started on CRRT. Patient did not show any clinical improvement and after discussion with family (as per patient's wishes) he was extubated and dialysis discontinued. He was then moved to medical floor with goal for comfort. Patient transferred to medical floor. However over the past 48 hours patient has shown remarkable stability.  SIGNIFICANT EVENTS: 11/13 - Admit  11/13 - Intubation  11/15 - Self-extubation 11/16 - Reintubation & HD catheter placement for CVVHD 11/20 CRRT stopped/ no further aggressive care. Extubated.   Assessment/Plan: Acute respiratory failure with hypoxia secondary to pulmonary edema and possible pneumonia. Admitted to ICU requiring intubation. Now extubated with goal for comfort. Completed antibiotics. Continue O2 via nasal cannula. He is currently euvolemic and maintaining O2 sat on 2 L via nasal cannula. I see  remarkable progress in his symptoms today.  NST EMI Has  underlying coronary artery disease. Troponin peaked at 18.51. 2-D echo showing EF of 65 and 70% with no wall motion abnormality. IV heparin discontinued after MI was resolving. On plavix. Hold ASA due to positive hemoccult. ( can be resumed if H&h stable) .Hold beta blocker since diastolic blood pressure still low. Appreciate cardiology recommendations.  Septic shock secondary to medial quite  pneumonia Lactic acid on admission of 6.69. Now resolved. Completed 7 day course of Rocephin and azithromycin.  Acute diastolic CHF Currently euvolemic.  Anemia with Hemoccult+ Received 2 unit PRBC since admission. Check hb  in am.  Acute on chronic kidney disease stage IV CRRT started on 11/16 due to volume overload and oliguric renal failure. He was taken off CRRT and made full comfort. His baseline creatinine is 4. Will need to reconsult renal if HD is needed again.    DVT prophylaxis: SCD   Code Status: DO NOT RESUSCITATE, Family Communication: Son and daughter at bedside Disposition Plan: Pending PT eval. Home with home health versus skilled nursing facility.   Consultants:  [facility] CM  Cardiology  Renal  Palliative care  STUDIES:  ECHO 11/13 >> EF 65 to 70%, grade 1 diastolic dysfx, PAS 65 mmHg .  PROCEDURES R Subclavian CVC 11/13>>> R IJ HD Catheter 11/16>>>   Antibiotics:  Treated with 7 days of Rocephin and azithromycin  HPI/Subjective: . Patient is alert and oriented. Reports feeling tired but denies any shortness of breath or chest discomfort.  Objective: Filed Vitals:   09/20/15 0113 09/20/15 0550  BP:  156/34  Pulse: 79 70  Temp:  98.1 F (36.7 C)  Resp: 16 20    Intake/Output Summary (Last 24 hours) at 09/20/15 1326 Last data filed at 09/20/15 0601  Gross per 24 hour  Intake    730 ml  Output    275 ml  Net    455 ml   Filed Weights   09/18/15 0500 09/18/15 1840 09/20/15 0550  Weight: 62.143 kg (137 lb) 62.6 kg (138 lb 0.1 oz) 62.506 kg (137 lb 12.8 oz)    Exam:   General: Elderly male not in distress  HEENT: No pallor, moist oral  mucosa  Chest: Clear to auscultation bilaterally, no added sounds  CVS: Normal S1-S2, no murmurs rub or gallop  GI: Soft, nondistended, nontender, bowel sounds present, foley+  Musculoskeletal:warm,  no edema  CNS: Alert and oriented  Data Reviewed: Basic Metabolic Panel:  Recent Labs Lab  09/14/15 0610  09/15/15 0400  09/16/15 0420 09/16/15 1545 09/17/15 0410 09/17/15 1550 09/18/15 0420  NA 145  < > 143  143  < > 138 137 139 137 137  K 4.2  < > 4.1  4.1  < > 4.2 4.3 4.7 5.1 5.2*  CL 104  < > 105  105  < > 102 103 103 102 102  CO2 23  < > 26  25  < > 27 27 29 30 29   GLUCOSE 147*  < > 94  95  < > 147* 133* 131* 81 131*  BUN 166*  < > 91*  91*  < > 48* 32* 24* 19 21*  CREATININE 6.01*  < > 2.91*  2.93*  < > 2.09* 1.53* 1.37* 1.19 1.29*  CALCIUM 8.9  < > 8.6*  8.5*  < > 7.8* 8.0* 8.1* 8.3* 8.0*  MG 2.9*  --  2.7*  --  2.6*  --  2.6*  --  2.4  PHOS 7.7*  < > 4.7*  4.7*  < > 4.1 2.2* 2.2* 2.6 1.9*  < > = values in this interval not displayed. Liver Function Tests:  Recent Labs Lab 09/16/15 0420 09/16/15 1545 09/17/15 0410 09/17/15 1550 09/18/15 0420  ALBUMIN 2.1* 2.3* 2.2* 2.4* 2.2*   No results for input(s): LIPASE, AMYLASE in the last 168 hours. No results for input(s): AMMONIA in the last 168 hours. CBC:  Recent Labs Lab 09/16/15 0420 09/16/15 1545 09/17/15 0410 09/17/15 1550 09/18/15 0420  WBC 10.6* 11.1* 11.0* 8.4 13.1*  NEUTROABS 8.5* 8.7* 8.5* 6.6 10.4*  HGB 8.2* 8.5* 8.6* 8.7* 8.8*  HCT 25.3* 25.9* 26.6* 27.5* 27.0*  MCV 92.0 91.2 92.4 92.9 93.4  PLT 136* 132* 146* 137* 130*   Cardiac Enzymes: No results for input(s): CKTOTAL, CKMB, CKMBINDEX, TROPONINI in the last 168 hours. BNP (last 3 results)  Recent Labs  09/11/15 0955  BNP 2200.4*    ProBNP (last 3 results) No results for input(s): PROBNP in the last 8760 hours.  CBG:  Recent Labs Lab 09/17/15 1939 09/17/15 2333 09/18/15 0424 09/18/15 0836 09/18/15 1215  GLUCAP 123* 145* 132* 145* 115*    Recent Results (from the past 240 hour(s))  Blood culture (routine x 2)     Status: None   Collection Time: 09/11/15  9:55 AM  Result Value Ref Range Status   Specimen Description BLOOD RIGHT ARM  Final   Special Requests BOTTLES DRAWN AEROBIC AND ANAEROBIC 5ML  Final    Culture NO GROWTH 5 DAYS  Final   Report Status 09/16/2015 FINAL  Final  Blood culture (routine x 2)     Status: None   Collection Time: 09/11/15 10:55 AM  Result Value Ref Range Status   Specimen Description BLOOD RIGHT HAND  Final   Special Requests BOTTLES DRAWN AEROBIC AND ANAEROBIC 10ML  Final   Culture NO GROWTH 5 DAYS  Final   Report Status 09/16/2015 FINAL  Final  MRSA PCR Screening     Status: None   Collection Time: 09/11/15  2:20 PM  Result Value Ref Range Status   MRSA by PCR NEGATIVE NEGATIVE Final    Comment:  The GeneXpert MRSA Assay (FDA approved for NASAL specimens only), is one component of a comprehensive MRSA colonization surveillance program. It is not intended to diagnose MRSA infection nor to guide or monitor treatment for MRSA infections.   Culture, respiratory (NON-Expectorated)     Status: None   Collection Time: 09/12/15  4:52 AM  Result Value Ref Range Status   Specimen Description TRACHEAL ASPIRATE  Final   Special Requests NONE  Final   Gram Stain   Final    ABUNDANT WBC PRESENT,BOTH PMN AND MONONUCLEAR RARE SQUAMOUS EPITHELIAL CELLS PRESENT NO ORGANISMS SEEN Performed at [facility]    Culture   Final    RARE YEAST CONSISTENT WITH CANDIDA SPECIES Performed at [facility]    Report Status 09/14/2015 FINAL  Final     Studies: No results found.  Scheduled Meds: . clopidogrel  75 mg Oral Daily  . heparin subcutaneous  5,000 Units Subcutaneous 3 times per day  . ipratropium-albuterol  3 mL Nebulization BID   Continuous Infusions:    Time spent: 25 minutes    Eddie North  [facility] Pager [phone] If 7PM-7AM, please contact night-coverage at [email or web address], password [email or web address] 09/20/2015, 1:26 PM  LOS: 9 days

## 2015-09-20 NOTE — Care Management Note (Addendum)
Case Management Note  Patient Details  Name: [name] MRN: [id number] Date of Birth: 11/17/1929  Subjective/Objective:      Date: 09/20/15 Spoke with patient's daughter , Eunice BlaseDebbie [phone].  Introduced self as Sports coachcase manager and explained role in discharge planning and how to be reached.  Verified patient lives in town, alone with daughter.  Expressed potential need for no other DME.  Verified patient anticipates to go to SNF at time of discharge and  He will be there alone when daughter is at work.  Daughter denied needing help with their medication.  Patient  is driven by daughter to MD appointments.  Verified patient has PCP Cam HaiKimberly Shaw. NCM spoke with physical therapist and they are recommending snf for patient.  NCM informed CSW of this information.   Plan: CM will continue to follow for discharge planning and [facility] resources.               Action/Plan:   Expected Discharge Date:                  Expected Discharge Plan:  Home w Home Health Services  In-House Referral:  Clinical Social Work  Discharge planning Services  CM Consult  Post Acute Care Choice:    Choice offered to:     DME Arranged:    DME Agency:     HH Arranged:    HH Agency:     Status of Service:  In process, will continue to follow  Medicare Important Message Given:  Yes Date Medicare IM Given:    Medicare IM give by:    Date Additional Medicare IM Given:    Additional Medicare Important Message give by:     If discussed at Long Length of Stay Meetings, dates discussed:    Additional Comments:  Leone Havenaylor, Latiana Tomei Clinton, RN 09/20/2015, 3:46 PM

## 2015-09-20 NOTE — Progress Notes (Signed)
   09/20/15 1200  Clinical Encounter Type  Visited With Patient and family together;Health care provider  Visit Type Initial  Referral From Nurse  Spiritual Encounters  Spiritual Needs Literature   Chaplain responded to a request for an advanced directive. Chaplain answered a few questions about the healthcare forms, and also acquired a durable power of attorney form for them to fill out after the hospitalization. Chaplain support available as needed.   Alda PonderAdam M Dereke Neumann, Chaplain 09/20/2015 12:01 PM

## 2015-09-20 NOTE — Progress Notes (Signed)
PT Cancellation Note  Patient Details Name: [name] MRN: [id number] DOB: 04/18/1930   Cancelled Treatment:    Reason Eval/Treat Not Completed: Patient declined, no reason specified Pt had just fallen asleep so family declined therapy at this time. Per notes, pt on comfort care. Will follow up next available time.   [name] 09/20/2015, 11:21 AM [name], PT, DPT [phone]

## 2015-09-21 ENCOUNTER — Inpatient Hospital Stay (HOSPITAL_COMMUNITY): Payer: Medicare Other

## 2015-09-21 LAB — IRON AND TIBC
IRON: 45 ug/dL (ref 45–182)
Saturation Ratios: 17 % — ABNORMAL LOW (ref 17.9–39.5)
TIBC: 259 ug/dL (ref 250–450)
UIBC: 214 ug/dL

## 2015-09-21 LAB — CBC
HCT: 24.6 % — ABNORMAL LOW (ref 39.0–52.0)
HEMOGLOBIN: 8.1 g/dL — AB (ref 13.0–17.0)
MCH: 30 pg (ref 26.0–34.0)
MCHC: 32.9 g/dL (ref 30.0–36.0)
MCV: 91.1 fL (ref 78.0–100.0)
Platelets: 228 10*3/uL (ref 150–400)
RBC: 2.7 MIL/uL — AB (ref 4.22–5.81)
RDW: 16.4 % — ABNORMAL HIGH (ref 11.5–15.5)
WBC: 13.2 10*3/uL — AB (ref 4.0–10.5)

## 2015-09-21 LAB — BASIC METABOLIC PANEL
ANION GAP: 14 (ref 5–15)
BUN: 113 mg/dL — AB (ref 6–20)
CHLORIDE: 100 mmol/L — AB (ref 101–111)
CO2: 22 mmol/L (ref 22–32)
Calcium: 8.9 mg/dL (ref 8.9–10.3)
Creatinine, Ser: 4.39 mg/dL — ABNORMAL HIGH (ref 0.61–1.24)
GFR calc Af Amer: 13 mL/min — ABNORMAL LOW (ref >60)
GFR, EST NON AFRICAN AMERICAN: 11 mL/min — AB (ref >60)
Glucose, Bld: 145 mg/dL — ABNORMAL HIGH (ref 65–99)
POTASSIUM: 4.2 mmol/L (ref 3.5–5.1)
SODIUM: 136 mmol/L (ref 135–145)

## 2015-09-21 MED ORDER — METOPROLOL SUCCINATE ER 50 MG PO TB24
50.0000 mg | ORAL_TABLET | Freq: Every day | ORAL | Status: DC
  Administered 2015-09-22 – 2015-09-24 (×3): 50 mg via ORAL
  Filled 2015-09-21 (×3): qty 1

## 2015-09-21 MED ORDER — DARBEPOETIN ALFA 200 MCG/0.4ML IJ SOSY
200.0000 ug | PREFILLED_SYRINGE | INTRAMUSCULAR | Status: DC
  Administered 2015-09-21: 200 ug via SUBCUTANEOUS
  Filled 2015-09-21: qty 0.4

## 2015-09-21 MED ORDER — ATORVASTATIN CALCIUM 80 MG PO TABS
80.0000 mg | ORAL_TABLET | Freq: Every day | ORAL | Status: DC
  Administered 2015-09-21 – 2015-09-23 (×3): 80 mg via ORAL
  Filled 2015-09-21 (×3): qty 1

## 2015-09-21 MED ORDER — ENOXAPARIN SODIUM 30 MG/0.3ML ~~LOC~~ SOLN
30.0000 mg | SUBCUTANEOUS | Status: DC
  Administered 2015-09-21 – 2015-09-24 (×4): 30 mg via SUBCUTANEOUS
  Filled 2015-09-21 (×4): qty 0.3

## 2015-09-21 MED ORDER — ISOSORBIDE MONONITRATE ER 60 MG PO TB24
60.0000 mg | ORAL_TABLET | Freq: Every day | ORAL | Status: DC
  Administered 2015-09-21 – 2015-09-24 (×4): 60 mg via ORAL
  Filled 2015-09-21 (×4): qty 1

## 2015-09-21 MED ORDER — METOPROLOL SUCCINATE ER 25 MG PO TB24
25.0000 mg | ORAL_TABLET | Freq: Every day | ORAL | Status: DC
  Administered 2015-09-21: 25 mg via ORAL
  Filled 2015-09-21: qty 1

## 2015-09-21 NOTE — Clinical Social Work Note (Signed)
Clinical Social Work Assessment  Patient Details  Name: [name] MRN: [id number] Date of Birth: 11/15/1929  Date of referral:  09/21/15               Reason for consult:  Facility Placement                Permission sought to share information with:  Facility Medical sales representativeContact Representative, Family Supports Permission granted to share information::  Yes, Verbal Permission Granted  Name::     Public affairs consultantDebbie  [facility]  Relationship::  dtr  Contact Information:     Housing/Transportation Living arrangements for the past 2 months:  Single Family Home Source of Information:  Patient, Adult Children Patient Interpreter Needed:  None Criminal Activity/Legal Involvement Pertinent to Current Situation/Hospitalization:  No - Comment as needed Significant Relationships:  Adult Children Lives with:  Adult Children Do you feel safe going back to the place where you live?  No Need for family participation in patient care:  Yes (Comment)  Care giving concerns:  Pt lives with dtr who works 12 hour shift- no enough support at home given pt current level of impairment   Office managerocial Worker assessment / plan:  CSW spoke with pt and pt dtr at bedside concerning PT recommendation for SNF  Employment status:  Retired Database administratornsurance information:  Managed Medicare PT Recommendations:  Skilled Nursing Facility Information / Referral to community resources:  Skilled Nursing Facility  Patient/Family's Response to care:  Pt and pt dtr are agreeable to SNF and recognize need for increased mobility prior to return home. CSW explained SNF referral process and [facility]  Patient/Family's Understanding of and Emotional Response to Diagnosis, Current Treatment, and Prognosis:  Pt and dtr hopeful that pt will continue to recovery after being very sick during this inpatient stay.  Emotional Assessment Appearance:  Appears stated age Attitude/Demeanor/Rapport:    Affect (typically  observed):  Appropriate Orientation:  Oriented to Self, Oriented to Place, Oriented to  Time, Oriented to Situation Alcohol / Substance use:  Not Applicable Psych involvement (Current and /or in the community):  No (Comment)  Discharge Needs  Concerns to be addressed:  Home Safety Concerns, Care Coordination Readmission within the last 30 days:  No Current discharge risk:  Physical Impairment Barriers to Discharge:  Continued Medical Work up   Izora RibasHoloman, Won Kreuzer M, LCSW 09/21/2015, 12:30 PM

## 2015-09-21 NOTE — Progress Notes (Signed)
   09/21/15 1400  Clinical Encounter Type  Visited With Patient  Visit Type Spiritual support  Referral From Nurse  Spiritual Encounters  Spiritual Needs Literature  Chaplain provided notary and witnesses for adv dir and made copies for patient.

## 2015-09-21 NOTE — Progress Notes (Signed)
  Upon coming to see the patient again due to his improving medical status, he reported his primary cardiologist is now Dr. Jacinto HalimGanji. This was verified with the patient's daughter who has taken him to several appointments and says he was seen there earlier this month. We will notify Dr. Jacinto HalimGanji of this patient's admission.  Leonides SchanzSigned, Ellsworth LennoxBrittany M Jahred Tatar , PA-C 11:21 AM 09/21/2015 Pager: [phone]

## 2015-09-21 NOTE — Progress Notes (Signed)
Bed offers given- pt and family choose [facility]  CSW will continue to follow  Merlyn LotJenna Holoman, [facility] HospitalCSWA Clinical Social Worker [phone]

## 2015-09-21 NOTE — Consult Note (Addendum)
CARDIOLOGY CONSULT NOTE  Patient ID: [name] MRN: [id number] DOB/AGE: 01/22/1930 81 y.o.  Admit date: 09/11/2015 Referring Physician  Gillie Manners, MD Primary Physician:  Lupita Raider, MD Reason for Consultation  NSTEMI  HPI: [name]  is a 81 y.o. male  With no known significant coronary artery disease by angiography in 2011, admitted to the hospital on 09/11/2015 with probable sepsis, severe anemia, near end-stage renal disease, acute on chronic diastolic heart failure and NSTEMI. He was previously evaluated by cardiology with [facility], this morning patient and family stated that there would like to change cardiology evaluation as they had established with me  Patient was critically ill, was intubated, received blood transfusion, had guaiac positive stool, was also treated for pneumonia. All these symptoms have resolved, patient due to worsening multi organ failure, was made DO NOT RESUSCITATE and palliative care consultation was initiated. Hemodialysis was discontinued. On 09/18/2015 it was decided not to reintubate patient if he gets extubated. Patient was extubated and all medications were discontinued.  However patient recovered well, on transfer to the floor on 09/20/2015, he was alert and oriented 3, had good urine output and he appeared to be back to his baseline. Hence I was reconsulted as patient has established with me previously.  He has known peripheral arterial disease and has undergone left external iliac artery stenting in January 2016 by Dr. York Ram. Following which he developed acute renal failure and eventually led to stage IV chronic kidney disease. He also has history of acute cholecystitis in May 2016 with history of cholecystostomy with percutaneous drainage, has remained stable since then. His other history includes noncritical coronary artery disease by angiography in 2002 and a low risk stress Myoview in September 2011. He has asymptomatic bilateral carotid artery  stenosis and has had left carotid endarterectomy in 2005 and has moderate to severe disease on the right and moderate disease on the left but carotid duplex being managed medically. He also has diabetes with diabetic nephropathy and peripheral neuropathy.  The latest echocardiogram revealing inferolateral hypokinesis, preserved ejection fraction of 65-70% % on 09/11/2015 and also on LVEF was noted to be normal on  09/12/2015.  His peak troponin was 18.  Presently he denies any chest pain, shortness of breath. Denies any PND or orthopnea. Overall he feels well.  Past Medical History  Diagnosis Date  . Diabetes mellitus   . Hypertension   . Hypercholesteremia   . Gout   . PVD (peripheral vascular disease) (HCC) 7/05    LCE, known 60% RICA 7/12  . CAD (coronary artery disease) 2002    non critical  . Macular degeneration   . Chronic renal disease, stage III   . Claudication (HCC)   . Palpitations     PVCs and PSVT on Holter monitoring  . Bilateral carotid artery disease (HCC)     status post left carotid endarterectomy performed by Dr. Madilyn Fireman 05/12/04  . Anemia      Past Surgical History  Procedure Laterality Date  . 2d echocardiogram  03/31/2008    EF greater than 55%  . Cardiovascular stress test  06/30/2010    Nonischemic. Low risk  . Cerebral angiogram  04/03/2004    High-grade 80% ostial L ICA stenosis. Medical management.  . Abdominal aortogram  09/01/2007    Widely patent renal arteries. Medical management.  . Peripheral vascular angiogram  05/07/2011    No evidence of intracranial occlusions, stenosis, dissections, or aneurysms seen  . Carotid doppler  06/09/2012  R Vertebral-known occluded vessel, R Bulb/Proximal ICA-moderate to severe amount of plaque w/50-69% diameter reduction, L Subclavian-50-69% diameter reduction, L CEA-demonstrated increased velocities w/o evidence of hemodynamically significant stenosis  . Cardiac catheterization  10/17/2001    No significant CAD,  normal LV systolic function.  . Carotid endarterectomy Left July 2005  . Lower extremity angiogram Bilateral 11/01/2014    Procedure: LOWER EXTREMITY ANGIOGRAM;  Surgeon: [name], MD;  Location: [facility] CATH LAB;  Service: Cardiovascular;  Laterality: Bilateral;  . Abdominal angiogram  11/01/2014    Procedure: ABDOMINAL ANGIOGRAM;  Surgeon: [name], MD;  Location: [facility] CATH LAB;  Service: Cardiovascular;;  . Knee surgery    . Hernia repair       Family History  Problem Relation Age of Onset  . Stroke Sister   . Heart disease Brother   . Diabetes Neg Hx      Social History: Social History   Social History  . Marital Status: Widowed    Spouse Name: N/A  . Number of Children: N/A  . Years of Education: N/A   Occupational History  . Not on file.   Social History Main Topics  . Smoking status: Former Smoker -- 1.00 packs/day for 40 years  . Smokeless tobacco: Never Used     Comment: quit approx. 20 years ago.  . Alcohol Use: No  . Drug Use: Not on file  . Sexual Activity: No   Other Topics Concern  . Not on file   Social History Narrative     Prescriptions prior to admission  Medication Sig Dispense Refill Last Dose  . ACCU-CHEK FASTCLIX LANCETS MISC Use to check blood sugar 2 times per day dx code E11.9 102 each 2   . allopurinol (ZYLOPRIM) 100 MG tablet TAKE 1 TABLET (100 MG TOTAL) BY MOUTH AT BEDTIME. 30 tablet 2 Past Week at Unknown time  . amLODipine (NORVASC) 10 MG tablet Take 1 tablet by mouth every morning.    09/09/2015  . aspirin EC 81 MG tablet Take 81 mg by mouth every evening.   09/09/2015  . atorvastatin (LIPITOR) 40 MG tablet Take 1 tablet by mouth daily.  5 09/02/2015  . calcitRIOL (ROCALTROL) 0.25 MCG capsule Take 0.25 mcg by mouth every morning.    09/09/2015  . cholecalciferol (VITAMIN D) 1000 UNITS tablet Take 1,000 Units by mouth 2 (two) times daily.    09/02/2015  . cloNIDine (CATAPRES) 0.1 MG tablet Take 1 tablet by mouth 3 (three) times  daily.  11 09/02/2015  . clopidogrel (PLAVIX) 75 MG tablet TAKE 1 TABLET BY MOUTH DAILY 30 tablet 10 09/09/2015  . feeding supplement, RESOURCE BREEZE, (RESOURCE BREEZE) LIQD Take 1 Container by mouth 3 (three) times daily between meals. 30 Container 0 Taking  . glucose blood (ACCU-CHEK AVIVA PLUS) test strip Use as instructed to check blood sugar 2 times per day dx code E11.9 100 each 3 Taking  . hydrALAZINE (APRESOLINE) 25 MG tablet Take 3 tablets (75 mg total) by mouth every 8 (eight) hours. 60 tablet 0 09/02/2015  . Potassium Chloride ER 20 MEQ TBCR Take 1 tablet by mouth daily.  6   . sitaGLIPtin (JANUVIA) 25 MG tablet Take 1 tablet (25 mg total) by mouth daily. 30 tablet 2 09/09/2015  . terazosin (HYTRIN) 10 MG capsule TAKE 1 CAPSULE (10 MG TOTAL) BY MOUTH AT BEDTIME. 90 capsule 0 09/08/2015  . hydrALAZINE (APRESOLINE) 50 MG tablet Take 50 mg by mouth 3 (three) times daily.  6   .  Multiple Vitamins-Minerals (PRESERVISION AREDS PO) Take 2 tablets by mouth daily.   Taking  . pantoprazole (PROTONIX) 40 MG tablet Take 1 tablet (40 mg total) by mouth daily. 30 tablet 0 Taking  . polyethylene glycol (MIRALAX / GLYCOLAX) packet Take 17 g by mouth 2 (two) times daily. 14 each 0 Taking     ROS: General: no fevers/chills/night sweats Eyes: no blurry vision, diplopia, or amaurosis ENT: no sore throat or hearing loss Resp: no cough, wheezing, or hemoptysis CV: no edema or palpitations GI: no abdominal pain, nausea, vomiting, diarrhea, or constipation GU: no dysuria, frequency, or hematuria Skin: no rash Neuro: no headache, numbness, tingling, or weakness of extremities Musculoskeletal: no joint pain or swelling Heme: no bleeding, DVT, or easy bruising Endo: no polydipsia or polyuria    Physical Exam: Blood pressure 155/49, pulse 66, temperature 98.2 F (36.8 C), temperature source Oral, resp. rate 23, height 5\' 6"  (1.676 m), weight 62.506 kg (137 lb 12.8 oz), SpO2 98 %.   General  appearance: alert, cooperative, appears stated age and no distress Lungs: clear to auscultation bilaterally Heart: regular rate and rhythm, S1, S2 normal, no murmur, click, rub or gallop Abdomen: soft, non-tender; bowel sounds normal; no masses,  no organomegaly Extremities: extremities normal, atraumatic, no cyanosis or edema Pulses: Inspection - Left - Loss of hair and Shiny atrophic skin, No Pigmentation, No Varicose veins. Right - Loss of hair and Shiny atrophic skin, No Pigmentation, No Varicose veins. Palpation - Edema - Bilateral - 3+ Pitting edema. Femoral pulse - Bilateral - 2+ and Bruit. Popliteal pulse - Bilateral - 2+. Dorsalis pedis pulse - Bilateral - Normal. Posterior tibial pulse - Bilateral - Absent. Neurologic: Grossly normal  Labs:   Lab Results  Component Value Date   WBC 13.2* 09/21/2015   HGB 8.1* 09/21/2015   HCT 24.6* 09/21/2015   MCV 91.1 09/21/2015   PLT 228 09/21/2015    Recent Labs Lab 09/21/15 0435  NA 136  K 4.2  CL 100*  CO2 22  BUN 113*  CREATININE 4.39*  CALCIUM 8.9  GLUCOSE 145*    Lipid Panel     Component Value Date/Time   CHOL 139 06/22/2014 0814   TRIG 116 06/22/2014 0814   HDL 41 06/22/2014 0814   CHOLHDL 3.4 06/22/2014 0814   VLDL 23 06/22/2014 0814   LDLCALC 75 06/22/2014 0814    BNP (last 3 results)  Recent Labs  09/11/15 0955  BNP 2200.4*    ProBNP (last 3 results) No results for input(s): PROBNP in the last 8760 hours.  HEMOGLOBIN A1C Lab Results  Component Value Date   HGBA1C 6.3 07/13/2015   MPG 160 03/23/2015    Cardiac Panel (last 3 results)  Recent Labs  09/11/15 2020 09/12/15 0235 09/13/15 1115  TROPONINI 11.37* 18.51* 11.65*    Lab Results  Component Value Date   TROPONINI 11.65* 09/13/2015     TSH No results for input(s): TSH in the last 8760 hours.  EKG: 09/12/2015: Normal sinus rhythm, LVH.   Scheduled Meds: . atorvastatin  80 mg Oral q1800  . clopidogrel  75 mg Oral Daily  .  enoxaparin (LOVENOX) injection  30 mg Subcutaneous Q24H  . ipratropium-albuterol  3 mL Nebulization BID  . metoprolol succinate  25 mg Oral Daily   Continuous Infusions:  PRN Meds:.sodium chloride, acetaminophen, albuterol, fentaNYL (SUBLIMAZE) injection, haloperidol lactate, ondansetron (ZOFRAN) IV, polyethylene glycol  ASSESSMENT AND PLAN:  1. NSTEMI, with preserved ejection fraction. 2. Stage IV chronic  kidney disease due to diabetes mellitus.  3. Chronic diastolic heart failure, presently well compensated. 4. Severe peripheral arterial disease, history of left iliac artery stenting in January 2016 5. History of right carotid endarterectomy, has residual bilateral moderate to severe carotid artery stenosis. 6. Hypertension 7. Hyperlipidemia  Recommendation: I suspect the acute presentation was probably related to a combination of severe anemia, probable pneumonia and underlying coronary artery disease. However patient has recuperated well and is presently not in any active heart failure and is not having any anginal symptoms. Also his renal function is stabilized. Hence at this point would recommend conservative medical therapy unless he develops any clinical evidence of heart failure or angina pectoris then consider coronary angiography. Obviously if he goes into renal failure needing dialysis I could certainly consider coronary angiography much earlier. Overall he is presently doing well and is back to his baseline. Would restart aspirin 81 mg by mouth daily if there is no contraindication from GI standpoint. Also consider reinitiation of diabetes mellitus medications. This was a 90 minute consultation including discussion with patient, his daughter over the telephone and his grandchildren at the bedside. Review of medical records. No active cardiac issues at this time, I will certainly be available if you need my assistance in management. Patient's daughter would like to make patient a full  code  and patient who is alert and oriented 3 also would like to be full code. But they are aware that if clinical situation changes they're amenable for DO NOT RESUSCITATE status.   I will increase the dose of metoprolol succinate from 25 mg to Toprol-XL 50 mg daily as his blood pressure is elevated, also add isosorbide mononitrate 60 mg daily for vasodilatory effect.  Yates Decamp, MD 09/21/2015, 2:22 PM [facility] Pager: [phone] Office: [phone] If no answer Cell [phone]

## 2015-09-21 NOTE — Progress Notes (Signed)
PROGRESS NOTE  Bryan DEMAREE ZOX:[id number] DOB: [birth year] DOA: 09/11/2015 PCP: Lupita Raider, MD  Brief narrative 81 year old male with stage IV chronic kidney disease, hypertension, coronary artery disease, diabetes mellitus, hyperlipidemia, peripheral vascular disease who presented to the ED on 11/13 with acute respiratory failure with acute pulmonary edema, hypertensive crisis and NST EMI with community-acquired pneumonia. Patient was transferred to ICU and was intubated for acute respiratory failure with cardiac ischemia. He was aggressively diuresed but developed renal failure and started on CRRT. Patient did not show any clinical improvement and after discussion with family (as per patient's wishes) he was extubated and dialysis discontinued. He was then moved to medical floor with goal for comfort. Patient transferred to medical floor. However over the past 48 hours patient has shown remarkable stability, and improvement. Palliative medicine was reconsulted. The patient remains DO NOT RESUSCITATE, but desires full scope of care.  As result, nephrology and cardiology were reconsulted.  SIGNIFICANT EVENTS: 11/13 - Admit  11/13 - Intubation  11/15 - Self-extubation 11/16 - Reintubation & HD catheter placement for CVVHD 11/20 CRRT stopped/ no further aggressive care. Extubated.   Assessment/Plan: Acute respiratory failure with hypoxia secondary to pulmonary edema and pneumonia. Admitted to ICU requiring intubation.  -self-extubated 09/13/15 with goal for comfort.  -Completed antibiotics.  -Continue O2 via nasal cannula.  -He is currently euvolemic and maintaining O2 sat on 4 L via nasal cannula.  NSTEMI Has underlying coronary artery disease.  -09/12/2015 Troponin peaked at 18.51.  -2-D echo showing EF of 65 and 70% with no wall motion abnormality. - IV heparin discontinued on 09/13/15 after MI was resolving/troponin trending down.  -Continue plavix.  -reconsult  cardiology -Restart statin -Restart beta blocker -am lipid panel  Acute diastolic CHF - echo on 09/11/2015 showed EF of 65% to 70% with Grade 1 Diastolic Dysfunction.  - BNP 2200 and weight 155 on admission.  - weight 137lbs on 09/21/2015. -Daily weights  Hematuria -secondary to foley trauma -09/20/15--foley was accidentally pulled upon -monitor for clearance  Septic shock secondary to community acquired pneumonia -Lactic acid on admission of 6.69.  -Now resolved.  -Completed 7 day course of Rocephin and azithromycin.  Anemia with Hemoccult+ -Received 2 unit PRBC since admission -trend Hgb -Baseline hemoglobin approximately 8  Acute on chronic kidney disease stage IV -CRRT started on 11/16 due to volume overload and oliguric renal failure. -taken off CRRT 09/14/15 and made full comfort. His baseline creatinine is 4.  -reconsult renal  Diabetes mellitus type 2 -07/13/2015 hemoglobin A1c 6.3 -NovoLog sliding scale  DVT prophylaxis: SCD   Code Status: DO NOT RESUSCITATE, Family Communication: Son updated at bedside--total time 35 min Disposition Plan: SNF when stable  Consultants:  PC CM  Cardiology  Renal  Palliative care  STUDIES:  ECHO 11/13 >> EF 65 to 70%, grade 1 diastolic dysfx, PAS 65 mmHg .  PROCEDURES R Subclavian CVC 11/13>>> R IJ HD Catheter 11/16>>>    Procedures/Studies: Dg Abd 1 View  09/15/2015  CLINICAL DATA:  Feeding tube placement. EXAM: ABDOMEN - 1 VIEW COMPARISON:  09/14/2015 and earlier 09/15/2015. FINDINGS: Nasogastric tube is present extending to the distal esophagus were turns 180 degrees and courses back up into the more proximal esophagus as tip is not visualized. Remainder of the exam is unchanged. IMPRESSION: Nasogastric tube courses to the distal esophagus, turns 180 degrees extending back up into the proximal esophagus as tip is not visualized. Care team aware of NG  tube position as it has already been repositioned and  awaiting imaging. Spoke with patient's nurse, Elissa Lovett, regarding the above findings. Electronically Signed   By: Elberta Fortis M.D.   On: 09/15/2015 10:20   Dg Abd 1 View  09/15/2015  CLINICAL DATA:  Feeding tube placement. EXAM: ABDOMEN - 1 VIEW COMPARISON:  09/14/2015 FINDINGS: Patient is rotated to the right. Nasogastric tube courses into the stomach and is looped over the right upper quadrant and courses back up into the distal esophagus with tip over the distal esophagus. Nonobstructive bowel gas pattern. Stable multifocal airspace process within the visualized lungs. Degenerative change of the spine. Calcified plaque over the abdominal aorta. IMPRESSION: Nonobstructive bowel gas pattern. Nasogastric tube is coiled within the stomach over the right upper quadrant and courses back into the distal esophagus with tip over the distal esophagus. Note that the Care team is aware of this finding as subsequent images have already been obtained with tube repositioning. Electronically Signed   By: Elberta Fortis M.D.   On: 09/15/2015 10:15   Dg Chest Port 1 View  09/18/2015  CLINICAL DATA:  Pneumonia, shortness of breath. EXAM: PORTABLE CHEST 1 VIEW COMPARISON:  09/15/2015 FINDINGS: Endotracheal tube is in stable position, 3 cm superior to the carina. Right PICC line terminates at the expected location of cavoatrial junction. Large bore central venous catheter versus sheath from right internal jugular approach is stable in appearance, terminating within the expected location of superior vena cava. Feeding catheter is partially visualized, excluded by collimation. Cardiomediastinal silhouette is normal. Mediastinal contours appear intact. There is no evidence of pleural effusion or pneumothorax. Lower lung fields excluded by collimation, but there is improved aeration of the visualized portions of the lungs. Osseous structures are without acute abnormality. Soft tissues are grossly normal. IMPRESSION:  Improved aeration of the visualized portions of the lungs. Lines and tubes as above. Electronically Signed   By: Ted Mcalpine M.D.   On: 09/18/2015 08:54   Portable Chest Xray  09/15/2015  CLINICAL DATA:  Respiratory failure, shortness of breath. EXAM: PORTABLE CHEST 1 VIEW COMPARISON:  September 14, 2015. FINDINGS: Stable cardiomediastinal silhouette. Endotracheal tube is in grossly good position and unchanged compared to prior exam. Right internal jugular and subclavian catheters are unchanged in position. No pneumothorax or significant pleural effusion is noted. Stable bilateral lung opacities are noted concerning for edema or pneumonia. Bony thorax is unremarkable. IMPRESSION: Stable support apparatus. Stable bilateral lung opacities as described above. Electronically Signed   By: Lupita Raider, M.D.   On: 09/15/2015 07:22   Dg Chest Portable 1 View  09/14/2015  CLINICAL DATA:  Dialysis catheter and endotracheal tube placement EXAM: PORTABLE CHEST 1 VIEW COMPARISON:  09/12/2015 FINDINGS: Endotracheal to and right-sided venous line are unchanged. Interval reduction of a RIGHT internal jugular catheter tip in distal SVC. No pneumothorax. There is diffuse bilateral fine airspace disease.  No consolidation. IMPRESSION: 1. Placement of RIGHT dialysis catheter without pneumothorax. 2. Endotracheal tube in good position. 3. Diffuse airspace disease consistent with pulmonary edema. No change. Electronically Signed   By: Genevive Bi M.D.   On: 09/14/2015 13:54   Dg Chest Port 1 View  09/12/2015  CLINICAL DATA:  RN states she is giving lasix and checking for fluid overload, pt has ETT, OGT, Rt subclavian line, hx htn, diabetes. Just tested positive for the flu and put on droplet precaution. EXAM: PORTABLE CHEST 1 VIEW COMPARISON:  09/11/2015 FINDINGS: Bilateral airspace opacities are less confluent  than on the previous day's study. There are no new lung opacities. Cardiac silhouette is top-normal  in size. No mediastinal or hilar masses or evidence of adenopathy. No pneumothorax. Endotracheal tube tip projects 3.1 cm above the carina. Orogastric tube passes below the diaphragm into the stomach. This is new from the earlier study. Right subclavian central venous line tip projects just above the caval atrial junction, well positioned. IMPRESSION: 1. Improved lung aeration although there is still significant bilateral airspace opacities, consistent with pulmonary edema. 2. No new abnormalities. 3. Support apparatus is well positioned. Electronically Signed   By: Amie Portland M.D.   On: 09/12/2015 10:21   Portable Chest Xray  09/11/2015  CLINICAL DATA:  Status post intubation and central line placement today. EXAM: PORTABLE CHEST 1 VIEW COMPARISON:  Single view of the chest earlier today. FINDINGS: New right subclavian catheter is seen with the tip projecting over the lower superior vena cava. No pneumothorax is identified. Endotracheal tube is also seen with the tip in good position 4 cm above the carina. Right worse than left airspace disease has increased since the study earlier today. No pneumothorax is identified. Defibrillator pads are in place. IMPRESSION: ET tube and right subclavian catheter project in good position. Negative for pneumothorax. Worsened right greater than left airspace disease could be due to asymmetric edema or pneumonia. Electronically Signed   By: Drusilla Kanner M.D.   On: 09/11/2015 16:39   Dg Chest Port 1 View  09/11/2015  CLINICAL DATA:  Shortness of breath, sepsis EXAM: PORTABLE CHEST 1 VIEW COMPARISON:  09/11/2015 FINDINGS: Defibrillator pads and monitor leads overlie the chest. Asymmetric mid and lower lung airspace process, worse on the right compatible with asymmetric edema or pneumonia. Heart remains enlarged. No developing significant effusion or pneumothorax. Trachea midline. Aortic atherosclerotic. No significant interval change. IMPRESSION: Persistent mid and  lower lung asymmetric airspace process worse on the right compatible with pneumonia or asymmetric edema. Electronically Signed   By: Judie Petit.  Shick M.D.   On: 09/11/2015 12:38   Dg Chest Port 1 View  09/11/2015  CLINICAL DATA:  Pt arrives from home via [facility]. Pt states he began having SOB last night and wasn't able to lay down and still breathe. Pt is extremely labored upon arrival and is using accessory muscles. Pt has bilateral coarse crackles. Pt states he is in "borderline kidney failure" and quit smoking 30 years ago EXAM: PORTABLE CHEST 1 VIEW COMPARISON:  03/21/2015 FINDINGS: Bilateral airspace opacity has developed in the mid to lower lungs, right greater than left. The upper lungs are clear. Cardiac silhouette is normal in size. No mediastinal or hilar masses. No definite pleural effusion and no pneumothorax. Bony thorax is demineralized but grossly intact. IMPRESSION: Bilateral mid to lower lung zone airspace opacities, right greater than left. This may reflect multifocal pneumonia. Pulmonary edema is possible, with pneumonia favored. Electronically Signed   By: Amie Portland M.D.   On: 09/11/2015 10:07   Dg Abd Portable 1v  09/15/2015  CLINICAL DATA:  NG tube placement EXAM: PORTABLE ABDOMEN - 1 VIEW COMPARISON:  09/15/2015 FINDINGS: Feeding tube tip in the proximal duodenum. Normal bowel gas pattern. IMPRESSION: Feeding tube tip in the proximal duodenum. Electronically Signed   By: Marlan Palau M.D.   On: 09/15/2015 13:30   Dg Abd Portable 1v  09/15/2015  CLINICAL DATA:  NG placement EXAM: PORTABLE ABDOMEN - 1 VIEW COMPARISON:  09/15/2015 FINDINGS: NG tube is coiled within the esophagus and does not enter  the stomach. The NG tube is been pulled back slightly since earlier today. Colonic gas is unchanged. Diffuse bilateral lung infiltrates unchanged IMPRESSION: NG tube coiled in the esophagus Electronically Signed   By: Marlan Palau M.D.   On: 09/15/2015 10:31   Dg Abd Portable  1v  09/14/2015  CLINICAL DATA:  Orogastric tube advancement. Initial encounter. For lab EXAM: PORTABLE ABDOMEN - 1 VIEW COMPARISON:  Abdominal radiograph performed earlier today at 9:37 p.m. FINDINGS: The patient's endotracheal tube has been advanced, now seen ending just above the carina. This should be retracted 2-3 cm. The enteric tube is noted ending at the distal esophagus. This should be advanced at least 9 cm. The visualized bowel gas pattern is difficult to fully assess due to motion artifact. Diffuse bilateral airspace opacification is again noted. No acute osseous abnormalities are seen. IMPRESSION: 1. Endotracheal tube has been advanced, now seen ending just above the carina. This should be retracted 2-3 cm. 2. Enteric tube noted ending at the distal esophagus. This should be advanced at least 9 cm. 3. Diffuse bilateral airspace opacification again noted. These results were called by telephone at the time of interpretation on 09/14/2015 at 11:16 pm to [facility], who verbally acknowledged these results. Electronically Signed   By: Roanna Raider M.D.   On: 09/14/2015 23:17   Dg Abd Portable 1v  09/14/2015  CLINICAL DATA:  Orogastric tube placement.  Initial encounter. EXAM: PORTABLE ABDOMEN - 1 VIEW COMPARISON:  Abdominal radiograph performed 09/11/2015 FINDINGS: The patient's enteric tube is noted ending overlying the gastroesophageal junction, with the side port at the distal esophagus. This could be advanced at least 6 cm. Diffuse bilateral airspace opacification may reflect pulmonary edema or pneumonia. ARDS cannot be excluded. The patient's endotracheal tube is seen ending 3 cm above the carina. A right IJ line is noted ending about the mid SVC. The visualized bowel gas pattern is grossly unremarkable. No acute osseous abnormalities are seen. IMPRESSION: 1. Enteric tube noted ending overlying the gastroesophageal junction, with the side port at the distal esophagus. This could be  advanced at least 6 cm. 2. Diffuse bilateral airspace opacification may reflect pulmonary edema or pneumonia. ARDS cannot be excluded. Electronically Signed   By: Roanna Raider M.D.   On: 09/14/2015 21:53   Dg Abd Portable 1v  09/11/2015  CLINICAL DATA:  Check OG tube placement EXAM: PORTABLE ABDOMEN - 1 VIEW COMPARISON:  03/27/2015 FINDINGS: Scattered large and small bowel gas is noted. A gastric catheter is noted within the stomach. No bony abnormality is seen. Left iliac arterial stenting is noted. IMPRESSION: Gastric catheter in satisfactory position. Electronically Signed   By: Alcide Clever M.D.   On: 09/11/2015 17:33        Subjective: Patient denies fevers, chills, headache, chest pain, dyspnea, nausea, vomiting, diarrhea, abdominal pain, dysuria, hematuria   Objective: Filed Vitals:   09/20/15 0829 09/20/15 2121 09/20/15 2128 09/21/15 0532  BP:   166/51 166/35  Pulse:  81 68 73  Temp:   97.4 F (36.3 C) 98.2 F (36.8 C)  TempSrc:   Oral Oral  Resp:  16 20 19   Height:      Weight:      SpO2: 96% 96% 94% 99%    Intake/Output Summary (Last 24 hours) at 09/21/15 1106 Last data filed at 09/21/15 0647  Gross per 24 hour  Intake    357 ml  Output    900 ml  Net   -543  ml   Weight change:  Exam:   General:  Pt is alert, follows commands appropriately, not in acute distress  HEENT: No icterus, No thrush, No neck mass, [place]/AT  Cardiovascular: RRR, S1/S2, no rubs, no gallops  Respiratory: Bibasilar crackles. No wheezing  Abdomen: Soft/+BS, non tender, non distended, no guarding  Extremities: No edema, No lymphangitis, No petechiae, No rashes, no synovitis  Data Reviewed: Basic Metabolic Panel:  Recent Labs Lab 09/15/15 0400  09/16/15 0420 09/16/15 1545 09/17/15 0410 09/17/15 1550 09/18/15 0420 09/21/15 0435  NA 143  143  < > 138 137 139 137 137 136  K 4.1  4.1  < > 4.2 4.3 4.7 5.1 5.2* 4.2  CL 105  105  < > 102 103 103 102 102 100*  CO2 26  25  < >  27 27 29 30 29 22   GLUCOSE 94  95  < > 147* 133* 131* 81 131* 145*  BUN 91*  91*  < > 48* 32* 24* 19 21* 113*  CREATININE 2.91*  2.93*  < > 2.09* 1.53* 1.37* 1.19 1.29* 4.39*  CALCIUM 8.6*  8.5*  < > 7.8* 8.0* 8.1* 8.3* 8.0* 8.9  MG 2.7*  --  2.6*  --  2.6*  --  2.4  --   PHOS 4.7*  4.7*  < > 4.1 2.2* 2.2* 2.6 1.9*  --   < > = values in this interval not displayed. Liver Function Tests:  Recent Labs Lab 09/16/15 0420 09/16/15 1545 09/17/15 0410 09/17/15 1550 09/18/15 0420  ALBUMIN 2.1* 2.3* 2.2* 2.4* 2.2*   No results for input(s): LIPASE, AMYLASE in the last 168 hours. No results for input(s): AMMONIA in the last 168 hours. CBC:  Recent Labs Lab 09/16/15 0420 09/16/15 1545 09/17/15 0410 09/17/15 1550 09/18/15 0420 09/21/15 0435  WBC 10.6* 11.1* 11.0* 8.4 13.1* 13.2*  NEUTROABS 8.5* 8.7* 8.5* 6.6 10.4*  --   HGB 8.2* 8.5* 8.6* 8.7* 8.8* 8.1*  HCT 25.3* 25.9* 26.6* 27.5* 27.0* 24.6*  MCV 92.0 91.2 92.4 92.9 93.4 91.1  PLT 136* 132* 146* 137* 130* 228   Cardiac Enzymes: No results for input(s): CKTOTAL, CKMB, CKMBINDEX, TROPONINI in the last 168 hours. BNP: Invalid input(s): POCBNP CBG:  Recent Labs Lab 09/17/15 1939 09/17/15 2333 09/18/15 0424 09/18/15 0836 09/18/15 1215  GLUCAP 123* 145* 132* 145* 115*    Recent Results (from the past 240 hour(s))  MRSA PCR Screening     Status: None   Collection Time: 09/11/15  2:20 PM  Result Value Ref Range Status   MRSA by PCR NEGATIVE NEGATIVE Final    Comment:        The GeneXpert MRSA Assay (FDA approved for NASAL specimens only), is one component of a comprehensive MRSA colonization surveillance program. It is not intended to diagnose MRSA infection nor to guide or monitor treatment for MRSA infections.   Culture, respiratory (NON-Expectorated)     Status: None   Collection Time: 09/12/15  4:52 AM  Result Value Ref Range Status   Specimen Description TRACHEAL ASPIRATE  Final   Special Requests  NONE  Final   Gram Stain   Final    ABUNDANT WBC PRESENT,BOTH PMN AND MONONUCLEAR RARE SQUAMOUS EPITHELIAL CELLS PRESENT NO ORGANISMS SEEN Performed at [facility]    Culture   Final    RARE YEAST CONSISTENT WITH CANDIDA SPECIES Performed at [facility]    Report Status 09/14/2015 FINAL  Final     Scheduled  Meds: . atorvastatin  80 mg Oral q1800  . clopidogrel  75 mg Oral Daily  . enoxaparin (LOVENOX) injection  30 mg Subcutaneous Q24H  . ipratropium-albuterol  3 mL Nebulization BID  . metoprolol succinate  25 mg Oral Daily   Continuous Infusions:    Briget Shaheed, DO  [facility] Pager [phone]  If 7PM-7AM, please contact night-coverage [email or web address] Password [email or web address] 09/21/2015, 11:06 AM   LOS: 10 days

## 2015-09-21 NOTE — Progress Notes (Addendum)
Daily Progress Note   Patient Name: [name]       Date: 09/21/2015 DOB: 11/25/1929  Age: 81 y.o. MRN#: [id number] Attending Physician: Catarina Hartshorn, MD Primary Care Physician: Lupita Raider, MD Admit Date: 09/11/2015  Reason for Consultation/Follow-up: Establishing goals of care  Subjective: Continues to improve. "Ready to get OOB"- eating well, "feels great" Interval Events: Extubated to comfort care 11/20 Length of Stay: 10 days  Current Medications: Scheduled Meds:  . clopidogrel  75 mg Oral Daily  . enoxaparin (LOVENOX) injection  30 mg Subcutaneous Q24H  . ipratropium-albuterol  3 mL Nebulization BID    Continuous Infusions:    PRN Meds: sodium chloride, acetaminophen, albuterol, fentaNYL (SUBLIMAZE) injection, haloperidol lactate, ondansetron (ZOFRAN) IV, polyethylene glycol  Physical Exam: Physical Exam              Vital Signs: BP 166/35 mmHg  Pulse 73  Temp(Src) 98.2 F (36.8 C) (Oral)  Resp 19  Ht  (1.676 m)  Wt 62.506 kg (137 lb 12.8 oz)  BMI 22.25 kg/m2  SpO2 99% SpO2: SpO2: 99 % O2 Device: O2 Device: Nasal Cannula O2 Flow Rate: O2 Flow Rate (L/min): 4 L/min  Intake/output summary:  Intake/Output Summary (Last 24 hours) at 09/21/15 0936 Last data filed at 09/21/15 6962  Gross per 24 hour  Intake    594 ml  Output   1300 ml  Net   -706 ml   LBM: Last BM Date: 09/16/15 Baseline Weight: Weight: 70.308 kg (155 lb) Most recent weight: Weight: 62.506 kg (137 lb 12.8 oz)       Palliative Assessment/Data: Flowsheet Rows        Most Recent Value   Intake Tab    Referral Department  Critical care   Unit at Time of Referral  ICU   Palliative Care Primary Diagnosis  Cardiac   Date Notified  09/13/15   Palliative Care Type  New Palliative care    Reason for referral  Clarify Goals of Care, End of Life Care Assistance   Date of Admission  09/11/15   Date first seen by Palliative Care  09/14/15   # of days IP prior to Palliative referral  2   Clinical Assessment    Palliative Performance Scale Score  10%   Pain Max last 24 hours  Not able to report   Pain Min Last 24 hours  Not able to report   Dyspnea Max Last 24 Hours  Not able to report   Dyspnea Min Last 24 hours  Not able to report   Nausea Max Last 24 Hours  Not able to report   Nausea Min Last 24 Hours  Not able to report   Anxiety Max Last 24 Hours  Not able to report   Anxiety Min Last 24 Hours  Not able to report   Other Max Last 24 Hours  Not able to report   Psychosocial & Spiritual Assessment    Social Work Plan of Care  Clarified patient/family wishes with healthcare team   Palliative Care Outcomes    Patient/Family meeting held?  Yes   Palliative Care Outcomes  Provided psychosocial or spiritual support, Changed CPR status   Palliative Care follow-up planned  No      Additional Data Reviewed: CBC    Component Value Date/Time   WBC 13.2* 09/21/2015 0435   RBC 2.70* 09/21/2015 0435   HGB 8.1* 09/21/2015 0435   HCT 24.6* 09/21/2015 0435   PLT 228 09/21/2015 0435   MCV 91.1 09/21/2015 0435   MCH 30.0 09/21/2015 0435   MCHC 32.9 09/21/2015 0435   RDW 16.4* 09/21/2015 0435   LYMPHSABS 1.6 09/18/2015 0420   MONOABS 0.8 09/18/2015 0420   EOSABS 0.3 09/18/2015 0420   BASOSABS 0.0 09/18/2015 0420    CMP     Component Value Date/Time   NA 136 09/21/2015 0435   K 4.2 09/21/2015 0435   CL 100* 09/21/2015 0435   CO2 22 09/21/2015 0435   GLUCOSE 145* 09/21/2015 0435   BUN 113* 09/21/2015 0435   CREATININE 4.39* 09/21/2015 0435   CALCIUM 8.9 09/21/2015 0435   PROT 6.4* 09/11/2015 1450   ALBUMIN 2.2* 09/18/2015 0420   AST 71* 09/11/2015 1450   ALT 23 09/11/2015 1450   ALKPHOS 84 09/11/2015 1450   BILITOT 1.0 09/11/2015 1450   GFRNONAA 11* 09/21/2015  0435   GFRAA 13* 09/21/2015 0435       Problem List:  Patient Active Problem List   Diagnosis Date Noted  . Respiratory failure (HCC)   . DNR (do not resuscitate)   . Acute on chronic renal failure (HCC)   . Lactic acidosis   . Acute diastolic CHF (congestive heart failure), NYHA class 4 (HCC)   . Acute congestive heart failure (HCC)   . Absolute anemia   . Bleeding gastrointestinal   . NSTEMI (non-ST elevated myocardial infarction) (HCC) 09/11/2015  . Acute respiratory failure with hypoxia (HCC) 09/11/2015  . Sepsis due to pneumonia (HCC) 09/11/2015  . Symptomatic anemia 09/11/2015  . Heme positive stool 09/11/2015  . CAP (community acquired pneumonia) 09/11/2015  . Acute hypokalemia 09/11/2015  . Acute respiratory failure (HCC) 09/11/2015  . Calculus of gallbladder without cholecystitis without obstruction   . Cholecystostomy care (HCC)   . Abnormal liver function tests   . Acute cholecystitis   . Elevated liver enzymes   . Abdominal pain, acute, right upper quadrant   . Abdominal pain 03/21/2015  . Uncontrolled diabetes mellitus (HCC) 03/21/2015  . Epigastric pain 03/21/2015  . Claudication in peripheral vascular disease (HCC) 11/01/2014  . Peripheral arterial disease (HCC) 09/02/2013  . History of tobacco abuse, remote 07/24/2013  . Bradycardia 06/17/2013  . Fatigue 06/17/2013  . CAD minor CAD in '02. Myoview low risk 9/11   . PVD LCE '05, known 60%  RICA   . Hypertensive urgency   . CKD (chronic kidney disease) stage 4, GFR 15-29 ml/min (HCC)   . Hypercholesteremia      Palliative Care Assessment & Plan    1.Code Status:  DNR    Code Status Orders        Start     Ordered   09/14/15 1615  Do not attempt resuscitation (DNR)   Continuous    Question Answer Comment  In the event of cardiac or respiratory ARREST Do not call a "code blue"   In the event of cardiac or respiratory ARREST Do not perform Intubation, CPR, defibrillation or ACLS   In the  event of cardiac or respiratory ARREST Use medication by any route, position, wound care, and other measures to relive pain and suffering. May use oxygen, suction and manual treatment of airway obstruction as needed for comfort.      09/14/15 1614    Advance Directive Documentation        Most Recent Value   Type of Advance Directive  Healthcare Power of Attorney   Pre-existing out of facility DNR order (yellow form or pink MOST form)     "MOST" Form in Place?         2. Goals of Care/Additional Recommendations:  Patient was extubated to comfort care but has made a remarkable and dramtic improvement post extubation, he had a high functional status prior to admission but at baseline had CKD stage IV and had been seeing Dr. Darrick Penna as an outpatient.  Limitations on Scope of Treatment: Patient requesting that all reasonable medical treatment options be explored.   Desire for further Chaplaincy support:yes  Psycho-social Needs: Caregiving  Support/Resources and Referral to [facility]   3. Symptom Management:      1.Maintain PRN pain and anxiety medication, has not needed them since extubation. Mobility will be key- PT order, requested RN get him OOB asap   4. Palliative Prophylaxis:   Bowel regimen  5. Prognosis: Unable to determine- depends on the level of medical interventions he elects- he needs a follow-up visit by Renal- may need to re-consult them given his unexpected improvement- he is really not full comfort care at this point even though this seems to be what he had done best with so far -would be very conservative in starting back medications.  6. Discharge Planning:  May need short term rehab tio overcome deconditioning-will need nephrology to weigh in on HD   Care plan was discussed with Patient, son and daughter  Thank you for allowing the Palliative Medicine Team to assist in the care of this patient.   Time In: 3 Time Out: 335 Total Time 35 Prolonged  Time Billed no        Edsel Petrin, DO  09/21/2015, 9:36 AM  Please contact Palliative Medicine Team phone at [phone] for questions and concerns.

## 2015-09-21 NOTE — Progress Notes (Signed)
Subjective: Interval History: Asked to reeval status after prolonged hosp for resp failure from pneu, CHF, severe anemia, GIB, complic by MI.  Entub x 2, on CRRT for 4 d, then family decided for comfort measures and patient actually improved.  Says he is weak and coughing now no other c/o.  Objective: Vital signs in last 24 hours: Temp:  [97.4 F (36.3 C)-98.2 F (36.8 C)] 98.2 F (36.8 C) (11/23 0532) Pulse Rate:  [66-81] 66 (11/23 1334) Resp:  [16-23] 23 (11/23 1334) BP: (155-166)/(35-51) 155/49 mmHg (11/23 1334) SpO2:  [94 %-99 %] 98 % (11/23 1334) Weight change:   Intake/Output from previous day: 11/22 0701 - 11/23 0700 In: 594 [P.O.:594] Out: 1425 [Urine:1425] Intake/Output this shift:    General appearance: cachectic, pale, slowed mentation and mentation decreased over past Resp: diminished breath sounds bibasilar, rales bibasilar and rhonchi bibasilar Cardio: S1, S2 normal and systolic murmur: systolic ejection 3/6, decrescendo at 2nd left intercostal space GI: pos bs, bruit, soft, liver down 5 cm Extremities: trophic changes distally, bilat carotid, fem bruits, emaciated  Lab Results:  Recent Labs  09/21/15 0435  WBC 13.2*  HGB 8.1*  HCT 24.6*  PLT 228   BMET:  Recent Labs  09/21/15 0435  NA 136  K 4.2  CL 100*  CO2 22  GLUCOSE 145*  BUN 113*  CREATININE 4.39*  CALCIUM 8.9   No results for input(s): PTH in the last 72 hours. Iron Studies: No results for input(s): IRON, TIBC, TRANSFERRIN, FERRITIN in the last 72 hours.  Studies/Results: No results found.  I have reviewed the patient's current medications. Prior to Admission:  Prescriptions prior to admission  Medication Sig Dispense Refill Last Dose  . ACCU-CHEK FASTCLIX LANCETS MISC Use to check blood sugar 2 times per day dx code E11.9 102 each 2   . allopurinol (ZYLOPRIM) 100 MG tablet TAKE 1 TABLET (100 MG TOTAL) BY MOUTH AT BEDTIME. 30 tablet 2 Past Week at Unknown time  . amLODipine  (NORVASC) 10 MG tablet Take 1 tablet by mouth every morning.    09/09/2015  . aspirin EC 81 MG tablet Take 81 mg by mouth every evening.   09/09/2015  . atorvastatin (LIPITOR) 40 MG tablet Take 1 tablet by mouth daily.  5 09/02/2015  . calcitRIOL (ROCALTROL) 0.25 MCG capsule Take 0.25 mcg by mouth every morning.    09/09/2015  . cholecalciferol (VITAMIN D) 1000 UNITS tablet Take 1,000 Units by mouth 2 (two) times daily.    09/02/2015  . cloNIDine (CATAPRES) 0.1 MG tablet Take 1 tablet by mouth 3 (three) times daily.  11 09/02/2015  . clopidogrel (PLAVIX) 75 MG tablet TAKE 1 TABLET BY MOUTH DAILY 30 tablet 10 09/09/2015  . feeding supplement, RESOURCE BREEZE, (RESOURCE BREEZE) LIQD Take 1 Container by mouth 3 (three) times daily between meals. 30 Container 0 Taking  . glucose blood (ACCU-CHEK AVIVA PLUS) test strip Use as instructed to check blood sugar 2 times per day dx code E11.9 100 each 3 Taking  . hydrALAZINE (APRESOLINE) 25 MG tablet Take 3 tablets (75 mg total) by mouth every 8 (eight) hours. 60 tablet 0 09/02/2015  . Potassium Chloride ER 20 MEQ TBCR Take 1 tablet by mouth daily.  6   . sitaGLIPtin (JANUVIA) 25 MG tablet Take 1 tablet (25 mg total) by mouth daily. 30 tablet 2 09/09/2015  . terazosin (HYTRIN) 10 MG capsule TAKE 1 CAPSULE (10 MG TOTAL) BY MOUTH AT BEDTIME. 90 capsule 0 09/08/2015  .  hydrALAZINE (APRESOLINE) 50 MG tablet Take 50 mg by mouth 3 (three) times daily.  6   . Multiple Vitamins-Minerals (PRESERVISION AREDS PO) Take 2 tablets by mouth daily.   Taking  . pantoprazole (PROTONIX) 40 MG tablet Take 1 tablet (40 mg total) by mouth daily. 30 tablet 0 Taking  . polyethylene glycol (MIRALAX / GLYCOLAX) packet Take 17 g by mouth 2 (two) times daily. 14 each 0 Taking    Assessment/Plan: 1 CKD 4 at baseline Crs until today were RRt. Now in high 4s and may rise, discussed with him . He will consider chronic HD now.   2 HTN always difficult vol ok 3 Pneu coughing , repeat CXR 4  PVD 5CAd 6 Anemia Had been missing Procrit contrib to low Hb,  Now GIB also. 7 HPHT check  8 Nonadherence 9 Debill, emaciation P nutriton, check labs daily, check CXR, PTH, Fe,     LOS: 10 days   [name] L 09/21/2015,3:18 PM

## 2015-09-22 LAB — COMPREHENSIVE METABOLIC PANEL
ALT: 41 U/L (ref 17–63)
AST: 36 U/L (ref 15–41)
Albumin: 2.4 g/dL — ABNORMAL LOW (ref 3.5–5.0)
Alkaline Phosphatase: 91 U/L (ref 38–126)
Anion gap: 11 (ref 5–15)
BUN: 120 mg/dL — ABNORMAL HIGH (ref 6–20)
CHLORIDE: 100 mmol/L — AB (ref 101–111)
CO2: 24 mmol/L (ref 22–32)
Calcium: 8.6 mg/dL — ABNORMAL LOW (ref 8.9–10.3)
Creatinine, Ser: 4.14 mg/dL — ABNORMAL HIGH (ref 0.61–1.24)
GFR, EST AFRICAN AMERICAN: 14 mL/min — AB (ref >60)
GFR, EST NON AFRICAN AMERICAN: 12 mL/min — AB (ref >60)
Glucose, Bld: 112 mg/dL — ABNORMAL HIGH (ref 65–99)
POTASSIUM: 4.3 mmol/L (ref 3.5–5.1)
SODIUM: 135 mmol/L (ref 135–145)
Total Bilirubin: 0.5 mg/dL (ref 0.3–1.2)
Total Protein: 4.9 g/dL — ABNORMAL LOW (ref 6.5–8.1)

## 2015-09-22 LAB — URINALYSIS, ROUTINE W REFLEX MICROSCOPIC
Bilirubin Urine: NEGATIVE
GLUCOSE, UA: NEGATIVE mg/dL
Ketones, ur: NEGATIVE mg/dL
Nitrite: NEGATIVE
PROTEIN: 30 mg/dL — AB
SPECIFIC GRAVITY, URINE: 1.014 (ref 1.005–1.030)
pH: 5 (ref 5.0–8.0)

## 2015-09-22 LAB — HEPATITIS B CORE ANTIBODY, IGM: HEP B C IGM: NEGATIVE

## 2015-09-22 LAB — URINE MICROSCOPIC-ADD ON: SQUAMOUS EPITHELIAL / LPF: NONE SEEN

## 2015-09-22 LAB — CBC
HEMATOCRIT: 23.5 % — AB (ref 39.0–52.0)
HEMOGLOBIN: 7.8 g/dL — AB (ref 13.0–17.0)
MCH: 30.1 pg (ref 26.0–34.0)
MCHC: 33.2 g/dL (ref 30.0–36.0)
MCV: 90.7 fL (ref 78.0–100.0)
Platelets: 236 10*3/uL (ref 150–400)
RBC: 2.59 MIL/uL — AB (ref 4.22–5.81)
RDW: 16.4 % — ABNORMAL HIGH (ref 11.5–15.5)
WBC: 9.8 10*3/uL (ref 4.0–10.5)

## 2015-09-22 LAB — HEPATITIS B SURFACE ANTIBODY,QUALITATIVE: Hep B S Ab: NONREACTIVE

## 2015-09-22 LAB — PARATHYROID HORMONE, INTACT (NO CA): PTH: 190 pg/mL — AB (ref 15–65)

## 2015-09-22 LAB — GLUCOSE, CAPILLARY
GLUCOSE-CAPILLARY: 212 mg/dL — AB (ref 65–99)
GLUCOSE-CAPILLARY: 110 mg/dL — AB (ref 65–99)
Glucose-Capillary: 152 mg/dL — ABNORMAL HIGH (ref 65–99)

## 2015-09-22 LAB — PHOSPHORUS: Phosphorus: 8.1 mg/dL — ABNORMAL HIGH (ref 2.5–4.6)

## 2015-09-22 LAB — HEPATITIS B SURFACE ANTIGEN: HEP B S AG: NEGATIVE

## 2015-09-22 MED ORDER — CALCITRIOL 0.25 MCG PO CAPS
0.2500 ug | ORAL_CAPSULE | Freq: Every day | ORAL | Status: DC
  Administered 2015-09-22 – 2015-09-24 (×3): 0.25 ug via ORAL
  Filled 2015-09-22 (×3): qty 1

## 2015-09-22 MED ORDER — INSULIN ASPART 100 UNIT/ML ~~LOC~~ SOLN
0.0000 [IU] | Freq: Three times a day (TID) | SUBCUTANEOUS | Status: DC
  Administered 2015-09-22 – 2015-09-23 (×3): 2 [IU] via SUBCUTANEOUS
  Administered 2015-09-23: 1 [IU] via SUBCUTANEOUS
  Administered 2015-09-24: 3 [IU] via SUBCUTANEOUS
  Administered 2015-09-24: 2 [IU] via SUBCUTANEOUS

## 2015-09-22 MED ORDER — HYDRALAZINE HCL 25 MG PO TABS
25.0000 mg | ORAL_TABLET | Freq: Two times a day (BID) | ORAL | Status: DC
  Administered 2015-09-22 – 2015-09-23 (×4): 25 mg via ORAL
  Filled 2015-09-22 (×4): qty 1

## 2015-09-22 MED ORDER — SUCROFERRIC OXYHYDROXIDE 500 MG PO CHEW
500.0000 mg | CHEWABLE_TABLET | Freq: Three times a day (TID) | ORAL | Status: DC
  Administered 2015-09-22 – 2015-09-24 (×7): 500 mg via ORAL
  Filled 2015-09-22 (×9): qty 1

## 2015-09-22 MED ORDER — SODIUM CHLORIDE 0.9 % IV SOLN
510.0000 mg | INTRAVENOUS | Status: DC
  Administered 2015-09-22: 510 mg via INTRAVENOUS
  Filled 2015-09-22 (×2): qty 17

## 2015-09-22 NOTE — Progress Notes (Signed)
Subjective: Interval History: has no complaint .  Objective: Vital signs in last 24 hours: Temp:  [98.2 F (36.8 C)-98.3 F (36.8 C)] 98.2 F (36.8 C) (11/24 0534) Pulse Rate:  [66-70] 66 (11/24 0534) Resp:  [18-23] 18 (11/23 2124) BP: (148-155)/(26-49) 148/26 mmHg (11/24 0534) SpO2:  [95 %-98 %] 95 % (11/24 0534) Weight:  [62.007 kg (136 lb 11.2 oz)] 62.007 kg (136 lb 11.2 oz) (11/24 0534) Weight change:   Intake/Output from previous day: 11/23 0701 - 11/24 0700 In: 420 [P.O.:420] Out: 900 [Urine:900] Intake/Output this shift:    General appearance: alert, cooperative, pale and slowed mentation Resp: diminished breath sounds bilaterally and rales bibasilar Cardio: S1, S2 normal, systolic murmur: systolic ejection 2/6, decrescendo at 2nd left intercostal space, 2nd systolic murmur: holosystolic 2/6, blowing at apex and diastolic murmur: early diastolic 2/6, decrescendo at 2nd left intercostal space GI: bruit, pos bs, soft, liver down 4 cm Extremities: bilatt fem bruits  Lab Results:  Recent Labs  09/21/15 0435 09/22/15 0234  WBC 13.2* 9.8  HGB 8.1* 7.8*  HCT 24.6* 23.5*  PLT 228 236   BMET:  Recent Labs  09/21/15 0435 09/22/15 0234  NA 136 135  K 4.2 4.3  CL 100* 100*  CO2 22 24  GLUCOSE 145* 112*  BUN 113* 120*  CREATININE 4.39* 4.14*  CALCIUM 8.9 8.6*    Recent Labs  09/21/15 1612  PTH 190*   Iron Studies:  Recent Labs  09/21/15 1612  IRON 45  TIBC 259    Studies/Results: Dg Chest 2 View  09/21/2015  CLINICAL DATA:  Pneumonia.  Hypertension. EXAM: CHEST  2 VIEW COMPARISON:  September 18, 2015 FINDINGS: There is slight atelectasis in the left base. The lungs elsewhere are clear. The heart size and pulmonary vascularity are normal. There is no appreciable adenopathy. Central catheter tip is in the superior vena cava. No pneumothorax. Bones are osteoporotic. There is atherosclerotic calcification in the aorta. IMPRESSION: Mild left base  atelectasis. Lungs elsewhere clear. Bones osteoporotic. Electronically Signed   By: Bretta BangWilliam  Woodruff III M.D.   On: 09/21/2015 17:02    I have reviewed the patient's current medications.  Assessment/Plan: 1 CKD4-5  Cr stable. Vol ok off diruetics, poor intake 2 Anemia needs esa, Fe 3 HPTH need binder, and vit D 4 Malnutrition 5 COPD 6 CAD 7 PVD 8 malnutrtion P suppl, ESA, Fe, Vit D, binder    LOS: 11 days   [name] L 09/22/2015,11:20 AM

## 2015-09-22 NOTE — Progress Notes (Signed)
MD notified that patient has BP of 160/36 with an order for scheduled hydralazine with no parameters.  Patient DBP trend is low.  MD stated it was ok to give.  Will continue to monitor patient.

## 2015-09-22 NOTE — Progress Notes (Signed)
PROGRESS NOTE  [name]:[id number] DOB: 12-Mar-1930 DOA: 09/11/2015 PCP: Lupita Raider, MD   Brief narrative 81 year old male with stage IV chronic kidney disease, hypertension, coronary artery disease, diabetes mellitus, hyperlipidemia, peripheral vascular disease who presented to the ED on 11/13 with acute respiratory failure with acute pulmonary edema, hypertensive crisis and NST EMI with community-acquired pneumonia. Patient was transferred to ICU and was intubated for acute respiratory failure with cardiac ischemia. He was aggressively diuresed but developed renal failure and started on CRRT. Patient did not show any clinical improvement and after discussion with family (as per patient's wishes) he was extubated and dialysis discontinued. He was then moved to medical floor with goal for comfort. Patient transferred to medical floor. However over the past 48 hours patient has shown remarkable stability, and improvement. Palliative medicine was reconsulted. The patient remains DO NOT RESUSCITATE, but desires full scope of care. As result, nephrology and cardiology were reconsulted.  SIGNIFICANT EVENTS: 11/13 - Admit  11/13 - Intubation  11/15 - Self-extubation 11/16 - Reintubation & HD catheter placement for CVVHD 11/20 CRRT stopped/ no further aggressive care. Extubated.   Assessment/Plan: Acute respiratory failure with hypoxia secondary to pulmonary edema and pneumonia. -Admitted to ICU requiring intubation.  -self-extubated 09/13/15 with goal for comfort.  -Completed antibiotics.  -Continue O2 via nasal cannula.  -He is currently euvolemic and maintaining O2 sat on 3 L via nasal cannula.  NSTEMI Has underlying coronary artery disease.  -09/12/2015 Troponin peaked at 18.51.  -2-D echo showing EF of 65 and 70% with no wall motion abnormality. - IV heparin discontinued on 09/13/15 after MI was resolving/troponin trending down.  -Continue plavix.   -reconsult cardiology -Continue Lipitor 80 mg daily -continue metoprolol succinate -am lipid panel  Acute diastolic CHF - echo on 09/11/2015 showed EF of 65% to 70% with Grade 1 Diastolic Dysfunction.  - BNP 2200 and weight 155 on admission.  -Daily weights -Currently euvolemic -09/21/2015 chest x-ray without pulmonary edema -Weight on admission--160 pounds -09/22/2015 weight--136 pounds -Continue metoprolol succinate and Imdur -Add hydralazine  Hematuria -secondary to foley trauma -09/20/15--foley was accidentally pulled upon -09/22/2015--hematuria improved  Septic shock secondary to community acquired pneumonia -Lactic acid on admission of 6.69.  -Now resolved.  -Completed 7 day course of Rocephin and azithromycin.  Anemia with Hemoccult+ -Received 2 unit PRBC since admission -trend Hgb -Baseline hemoglobin approximately 8  chronic kidney disease stage IV -CRRT started on 11/16 due to volume overload and oliguric renal failure. -taken off CRRT 09/14/15 and made full comfort. -However, after transfer to the medical floor, the patient improved clinically and nephrology was reconsulted  -His baseline creatinine approx 4.  -appreciate renal follow up--discussed with Dr. Darrick Penna -pt now agreeable for chronic HD if needed  COPD ->50 pk year history -stable on 3 L [place] -start aerosolized albuterol and atrovent  Diabetes mellitus type 2 -07/13/2015 hemoglobin A1c 6.3 -NovoLog sliding scale -Previously took Januvia in the outpatient setting  Hypertension -Continue metoprolol succinate and Imdur -Add hydralazine  DVT prophylaxis: lovenox   Code Status: FULL CODE--Dr. Jacinto Halim discussed with daughter Family Communication: Daughter updated at bedside--total time 35 min Disposition Plan: SNF when stable  Consultants:  PC CM  Cardiology  Renal  Palliative care  STUDIES:  ECHO 11/13 >> EF 65 to 70%, grade 1 diastolic dysfx, PAS 65  mmHg .  PROCEDURES R Subclavian CVC 11/13>>> R IJ HD Catheter 11/16>>>   Procedures/Studies: Dg Chest 2 View  09/21/2015  CLINICAL DATA:  Pneumonia.  Hypertension. EXAM: CHEST  2 VIEW COMPARISON:  September 18, 2015 FINDINGS: There is slight atelectasis in the left base. The lungs elsewhere are clear. The heart size and pulmonary vascularity are normal. There is no appreciable adenopathy. Central catheter tip is in the superior vena cava. No pneumothorax. Bones are osteoporotic. There is atherosclerotic calcification in the aorta. IMPRESSION: Mild left base atelectasis. Lungs elsewhere clear. Bones osteoporotic. Electronically Signed   By: Bretta Bang III M.D.   On: 09/21/2015 17:02   Dg Abd 1 View  09/15/2015  CLINICAL DATA:  Feeding tube placement. EXAM: ABDOMEN - 1 VIEW COMPARISON:  09/14/2015 and earlier 09/15/2015. FINDINGS: Nasogastric tube is present extending to the distal esophagus were turns 180 degrees and courses back up into the more proximal esophagus as tip is not visualized. Remainder of the exam is unchanged. IMPRESSION: Nasogastric tube courses to the distal esophagus, turns 180 degrees extending back up into the proximal esophagus as tip is not visualized. Care team aware of NG tube position as it has already been repositioned and awaiting imaging. Spoke with patient's nurse, Elissa Lovett, regarding the above findings. Electronically Signed   By: Elberta Fortis M.D.   On: 09/15/2015 10:20   Dg Abd 1 View  09/15/2015  CLINICAL DATA:  Feeding tube placement. EXAM: ABDOMEN - 1 VIEW COMPARISON:  09/14/2015 FINDINGS: Patient is rotated to the right. Nasogastric tube courses into the stomach and is looped over the right upper quadrant and courses back up into the distal esophagus with tip over the distal esophagus. Nonobstructive bowel gas pattern. Stable multifocal airspace process within the visualized lungs. Degenerative change of the spine. Calcified plaque over the  abdominal aorta. IMPRESSION: Nonobstructive bowel gas pattern. Nasogastric tube is coiled within the stomach over the right upper quadrant and courses back into the distal esophagus with tip over the distal esophagus. Note that the Care team is aware of this finding as subsequent images have already been obtained with tube repositioning. Electronically Signed   By: Elberta Fortis M.D.   On: 09/15/2015 10:15   Dg Chest Port 1 View  09/18/2015  CLINICAL DATA:  Pneumonia, shortness of breath. EXAM: PORTABLE CHEST 1 VIEW COMPARISON:  09/15/2015 FINDINGS: Endotracheal tube is in stable position, 3 cm superior to the carina. Right PICC line terminates at the expected location of cavoatrial junction. Large bore central venous catheter versus sheath from right internal jugular approach is stable in appearance, terminating within the expected location of superior vena cava. Feeding catheter is partially visualized, excluded by collimation. Cardiomediastinal silhouette is normal. Mediastinal contours appear intact. There is no evidence of pleural effusion or pneumothorax. Lower lung fields excluded by collimation, but there is improved aeration of the visualized portions of the lungs. Osseous structures are without acute abnormality. Soft tissues are grossly normal. IMPRESSION: Improved aeration of the visualized portions of the lungs. Lines and tubes as above. Electronically Signed   By: Ted Mcalpine M.D.   On: 09/18/2015 08:54   Portable Chest Xray  09/15/2015  CLINICAL DATA:  Respiratory failure, shortness of breath. EXAM: PORTABLE CHEST 1 VIEW COMPARISON:  September 14, 2015. FINDINGS: Stable cardiomediastinal silhouette. Endotracheal tube is in grossly good position and unchanged compared to prior exam. Right internal jugular and subclavian catheters are unchanged in position. No pneumothorax or significant pleural effusion is noted. Stable bilateral lung opacities are noted concerning for edema or  pneumonia. Bony thorax is unremarkable. IMPRESSION: Stable support apparatus. Stable bilateral  lung opacities as described above. Electronically Signed   By: Lupita Raider, M.D.   On: 09/15/2015 07:22   Dg Chest Portable 1 View  09/14/2015  CLINICAL DATA:  Dialysis catheter and endotracheal tube placement EXAM: PORTABLE CHEST 1 VIEW COMPARISON:  09/12/2015 FINDINGS: Endotracheal to and right-sided venous line are unchanged. Interval reduction of a RIGHT internal jugular catheter tip in distal SVC. No pneumothorax. There is diffuse bilateral fine airspace disease.  No consolidation. IMPRESSION: 1. Placement of RIGHT dialysis catheter without pneumothorax. 2. Endotracheal tube in good position. 3. Diffuse airspace disease consistent with pulmonary edema. No change. Electronically Signed   By: Genevive Bi M.D.   On: 09/14/2015 13:54   Dg Chest Port 1 View  09/12/2015  CLINICAL DATA:  RN states she is giving lasix and checking for fluid overload, pt has ETT, OGT, Rt subclavian line, hx htn, diabetes. Just tested positive for the flu and put on droplet precaution. EXAM: PORTABLE CHEST 1 VIEW COMPARISON:  09/11/2015 FINDINGS: Bilateral airspace opacities are less confluent than on the previous day's study. There are no new lung opacities. Cardiac silhouette is top-normal in size. No mediastinal or hilar masses or evidence of adenopathy. No pneumothorax. Endotracheal tube tip projects 3.1 cm above the carina. Orogastric tube passes below the diaphragm into the stomach. This is new from the earlier study. Right subclavian central venous line tip projects just above the caval atrial junction, well positioned. IMPRESSION: 1. Improved lung aeration although there is still significant bilateral airspace opacities, consistent with pulmonary edema. 2. No new abnormalities. 3. Support apparatus is well positioned. Electronically Signed   By: Amie Portland M.D.   On: 09/12/2015 10:21   Portable Chest  Xray  09/11/2015  CLINICAL DATA:  Status post intubation and central line placement today. EXAM: PORTABLE CHEST 1 VIEW COMPARISON:  Single view of the chest earlier today. FINDINGS: New right subclavian catheter is seen with the tip projecting over the lower superior vena cava. No pneumothorax is identified. Endotracheal tube is also seen with the tip in good position 4 cm above the carina. Right worse than left airspace disease has increased since the study earlier today. No pneumothorax is identified. Defibrillator pads are in place. IMPRESSION: ET tube and right subclavian catheter project in good position. Negative for pneumothorax. Worsened right greater than left airspace disease could be due to asymmetric edema or pneumonia. Electronically Signed   By: Drusilla Kanner M.D.   On: 09/11/2015 16:39   Dg Chest Port 1 View  09/11/2015  CLINICAL DATA:  Shortness of breath, sepsis EXAM: PORTABLE CHEST 1 VIEW COMPARISON:  09/11/2015 FINDINGS: Defibrillator pads and monitor leads overlie the chest. Asymmetric mid and lower lung airspace process, worse on the right compatible with asymmetric edema or pneumonia. Heart remains enlarged. No developing significant effusion or pneumothorax. Trachea midline. Aortic atherosclerotic. No significant interval change. IMPRESSION: Persistent mid and lower lung asymmetric airspace process worse on the right compatible with pneumonia or asymmetric edema. Electronically Signed   By: Judie Petit.  Shick M.D.   On: 09/11/2015 12:38   Dg Chest Port 1 View  09/11/2015  CLINICAL DATA:  Pt arrives from home via [facility]. Pt states he began having SOB last night and wasn't able to lay down and still breathe. Pt is extremely labored upon arrival and is using accessory muscles. Pt has bilateral coarse crackles. Pt states he is in "borderline kidney failure" and quit smoking 30 years ago EXAM: PORTABLE CHEST 1 VIEW COMPARISON:  03/21/2015  FINDINGS: Bilateral airspace opacity has developed in the  mid to lower lungs, right greater than left. The upper lungs are clear. Cardiac silhouette is normal in size. No mediastinal or hilar masses. No definite pleural effusion and no pneumothorax. Bony thorax is demineralized but grossly intact. IMPRESSION: Bilateral mid to lower lung zone airspace opacities, right greater than left. This may reflect multifocal pneumonia. Pulmonary edema is possible, with pneumonia favored. Electronically Signed   By: Amie Portland M.D.   On: 09/11/2015 10:07   Dg Abd Portable 1v  09/15/2015  CLINICAL DATA:  NG tube placement EXAM: PORTABLE ABDOMEN - 1 VIEW COMPARISON:  09/15/2015 FINDINGS: Feeding tube tip in the proximal duodenum. Normal bowel gas pattern. IMPRESSION: Feeding tube tip in the proximal duodenum. Electronically Signed   By: Marlan Palau M.D.   On: 09/15/2015 13:30   Dg Abd Portable 1v  09/15/2015  CLINICAL DATA:  NG placement EXAM: PORTABLE ABDOMEN - 1 VIEW COMPARISON:  09/15/2015 FINDINGS: NG tube is coiled within the esophagus and does not enter the stomach. The NG tube is been pulled back slightly since earlier today. Colonic gas is unchanged. Diffuse bilateral lung infiltrates unchanged IMPRESSION: NG tube coiled in the esophagus Electronically Signed   By: Marlan Palau M.D.   On: 09/15/2015 10:31   Dg Abd Portable 1v  09/14/2015  CLINICAL DATA:  Orogastric tube advancement. Initial encounter. For lab EXAM: PORTABLE ABDOMEN - 1 VIEW COMPARISON:  Abdominal radiograph performed earlier today at 9:37 p.m. FINDINGS: The patient's endotracheal tube has been advanced, now seen ending just above the carina. This should be retracted 2-3 cm. The enteric tube is noted ending at the distal esophagus. This should be advanced at least 9 cm. The visualized bowel gas pattern is difficult to fully assess due to motion artifact. Diffuse bilateral airspace opacification is again noted. No acute osseous abnormalities are seen. IMPRESSION: 1. Endotracheal tube has been  advanced, now seen ending just above the carina. This should be retracted 2-3 cm. 2. Enteric tube noted ending at the distal esophagus. This should be advanced at least 9 cm. 3. Diffuse bilateral airspace opacification again noted. These results were called by telephone at the time of interpretation on 09/14/2015 at 11:16 pm to [facility] on [facility], who verbally acknowledged these results. Electronically Signed   By: Roanna Raider M.D.   On: 09/14/2015 23:17   Dg Abd Portable 1v  09/14/2015  CLINICAL DATA:  Orogastric tube placement.  Initial encounter. EXAM: PORTABLE ABDOMEN - 1 VIEW COMPARISON:  Abdominal radiograph performed 09/11/2015 FINDINGS: The patient's enteric tube is noted ending overlying the gastroesophageal junction, with the side port at the distal esophagus. This could be advanced at least 6 cm. Diffuse bilateral airspace opacification may reflect pulmonary edema or pneumonia. ARDS cannot be excluded. The patient's endotracheal tube is seen ending 3 cm above the carina. A right IJ line is noted ending about the mid SVC. The visualized bowel gas pattern is grossly unremarkable. No acute osseous abnormalities are seen. IMPRESSION: 1. Enteric tube noted ending overlying the gastroesophageal junction, with the side port at the distal esophagus. This could be advanced at least 6 cm. 2. Diffuse bilateral airspace opacification may reflect pulmonary edema or pneumonia. ARDS cannot be excluded. Electronically Signed   By: Roanna Raider M.D.   On: 09/14/2015 21:53   Dg Abd Portable 1v  09/11/2015  CLINICAL DATA:  Check OG tube placement EXAM: PORTABLE ABDOMEN - 1 VIEW COMPARISON:  03/27/2015 FINDINGS: Scattered large  and small bowel gas is noted. A gastric catheter is noted within the stomach. No bony abnormality is seen. Left iliac arterial stenting is noted. IMPRESSION: Gastric catheter in satisfactory position. Electronically Signed   By: Alcide Clever M.D.   On: 09/11/2015 17:33          Subjective:   Objective: Filed Vitals:   09/21/15 1334 09/21/15 2036 09/21/15 2124 09/22/15 0534  BP: 155/49  149/37 148/26  Pulse: 66  70 66  Temp:   98.3 F (36.8 C) 98.2 F (36.8 C)  TempSrc:   Oral Oral  Resp: 23  18   Height:      Weight:    62.007 kg (136 lb 11.2 oz)  SpO2: 98% 95% 96% 95%    Intake/Output Summary (Last 24 hours) at 09/22/15 1251 Last data filed at 09/22/15 0536  Gross per 24 hour  Intake    320 ml  Output    900 ml  Net   -580 ml   Weight change:  Exam:   General:  Pt is alert, follows commands appropriately, not in acute distress  HEENT: No icterus, No thrush, No neck mass, [place]/AT  Cardiovascular: RRR, S1/S2, no rubs, no gallops  Respiratory: CTA bilaterally, no wheezing, no crackles, no rhonchi  Abdomen: Soft/+BS, non tender, non distended, no guarding  Extremities: No edema, No lymphangitis, No petechiae, No rashes, no synovitis  Data Reviewed: Basic Metabolic Panel:  Recent Labs Lab 09/16/15 0420 09/16/15 1545 09/17/15 0410 09/17/15 1550 09/18/15 0420 09/21/15 0435 09/22/15 0234  NA 138 137 139 137 137 136 135  K 4.2 4.3 4.7 5.1 5.2* 4.2 4.3  CL 102 103 103 102 102 100* 100*  CO2 GLUCOSE 147* 133* 131* 81 131* 145* 112*  BUN 48* 32* 24* 19 21* 113* 120*  CREATININE 2.09* 1.53* 1.37* 1.19 1.29* 4.39* 4.14*  CALCIUM 7.8* 8.0* 8.1* 8.3* 8.0* 8.9 8.6*  MG 2.6*  --  2.6*  --  2.4  --   --   PHOS 4.1 2.2* 2.2* 2.6 1.9*  --  8.1*   Liver Function Tests:  Recent Labs Lab 09/16/15 1545 09/17/15 0410 09/17/15 1550 09/18/15 0420 09/22/15 0234  AST  --   --   --   --  36  ALT  --   --   --   --  41  ALKPHOS  --   --   --   --  91  BILITOT  --   --   --   --  0.5  PROT  --   --   --   --  4.9*  ALBUMIN 2.3* 2.2* 2.4* 2.2* 2.4*   No results for input(s): LIPASE, AMYLASE in the last 168 hours. No results for input(s): AMMONIA in the last 168 hours. CBC:  Recent Labs Lab  09/16/15 0420 09/16/15 1545 09/17/15 0410 09/17/15 1550 09/18/15 0420 09/21/15 0435 09/22/15 0234  WBC 10.6* 11.1* 11.0* 8.4 13.1* 13.2* 9.8  NEUTROABS 8.5* 8.7* 8.5* 6.6 10.4*  --   --   HGB 8.2* 8.5* 8.6* 8.7* 8.8* 8.1* 7.8*  HCT 25.3* 25.9* 26.6* 27.5* 27.0* 24.6* 23.5*  MCV 92.0 91.2 92.4 92.9 93.4 91.1 90.7  PLT 136* 132* 146* 137* 130* 228 236   Cardiac Enzymes: No results for input(s): CKTOTAL, CKMB, CKMBINDEX, TROPONINI in the last 168 hours. BNP: Invalid input(s): POCBNP CBG:  Recent Labs Lab 09/17/15 2333 09/18/15 0424 09/18/15 0836 09/18/15 1215  09/22/15 1206  GLUCAP 145* 132* 145* 115* 212*    No results found for this or any previous visit (from the past 240 hour(s)).   Scheduled Meds: . atorvastatin  80 mg Oral q1800  . calcitRIOL  0.25 mcg Oral Daily  . clopidogrel  75 mg Oral Daily  . darbepoetin (ARANESP) injection - NON-DIALYSIS  200 mcg Subcutaneous Q Wed-1800  . enoxaparin (LOVENOX) injection  30 mg Subcutaneous Q24H  . ferumoxytol  510 mg Intravenous Weekly  . ipratropium-albuterol  3 mL Nebulization BID  . isosorbide mononitrate  60 mg Oral Daily  . metoprolol succinate  50 mg Oral Daily  . sucroferric oxyhydroxide  500 mg Oral TID WC   Continuous Infusions:    Brondon Wann, DO  [facility] Pager [phone]  If 7PM-7AM, please contact night-coverage [email or web address] Password [email or web address] 09/22/2015, 12:51 PM   LOS: 11 days

## 2015-09-23 DIAGNOSIS — N184 Chronic kidney disease, stage 4 (severe): Secondary | ICD-10-CM

## 2015-09-23 DIAGNOSIS — I129 Hypertensive chronic kidney disease with stage 1 through stage 4 chronic kidney disease, or unspecified chronic kidney disease: Secondary | ICD-10-CM

## 2015-09-23 LAB — COMPREHENSIVE METABOLIC PANEL
ALT: 35 U/L (ref 17–63)
AST: 29 U/L (ref 15–41)
Albumin: 2.4 g/dL — ABNORMAL LOW (ref 3.5–5.0)
Alkaline Phosphatase: 85 U/L (ref 38–126)
Anion gap: 12 (ref 5–15)
BILIRUBIN TOTAL: 0.9 mg/dL (ref 0.3–1.2)
BUN: 128 mg/dL — AB (ref 6–20)
CHLORIDE: 103 mmol/L (ref 101–111)
CO2: 22 mmol/L (ref 22–32)
CREATININE: 3.66 mg/dL — AB (ref 0.61–1.24)
Calcium: 8.8 mg/dL — ABNORMAL LOW (ref 8.9–10.3)
GFR, EST AFRICAN AMERICAN: 16 mL/min — AB (ref >60)
GFR, EST NON AFRICAN AMERICAN: 14 mL/min — AB (ref >60)
Glucose, Bld: 125 mg/dL — ABNORMAL HIGH (ref 65–99)
POTASSIUM: 3.7 mmol/L (ref 3.5–5.1)
Sodium: 137 mmol/L (ref 135–145)
TOTAL PROTEIN: 4.9 g/dL — AB (ref 6.5–8.1)

## 2015-09-23 LAB — CBC
HCT: 23.3 % — ABNORMAL LOW (ref 39.0–52.0)
Hemoglobin: 7.7 g/dL — ABNORMAL LOW (ref 13.0–17.0)
MCH: 29.8 pg (ref 26.0–34.0)
MCHC: 33 g/dL (ref 30.0–36.0)
MCV: 90.3 fL (ref 78.0–100.0)
PLATELETS: 201 10*3/uL (ref 150–400)
RBC: 2.58 MIL/uL — AB (ref 4.22–5.81)
RDW: 16.4 % — AB (ref 11.5–15.5)
WBC: 7.9 10*3/uL (ref 4.0–10.5)

## 2015-09-23 LAB — GLUCOSE, CAPILLARY
GLUCOSE-CAPILLARY: 158 mg/dL — AB (ref 65–99)
GLUCOSE-CAPILLARY: 143 mg/dL — AB (ref 65–99)
GLUCOSE-CAPILLARY: 172 mg/dL — AB (ref 65–99)
Glucose-Capillary: 151 mg/dL — ABNORMAL HIGH (ref 65–99)

## 2015-09-23 LAB — LIPID PANEL
CHOL/HDL RATIO: 2.7 ratio
CHOLESTEROL: 110 mg/dL (ref 0–200)
HDL: 41 mg/dL (ref >40)
LDL Cholesterol: 48 mg/dL (ref 0–99)
TRIGLYCERIDES: 105 mg/dL (ref <150)
VLDL: 21 mg/dL (ref 0–40)

## 2015-09-23 LAB — HEMOGLOBIN A1C
HEMOGLOBIN A1C: 6.1 % — AB (ref 4.8–5.6)
MEAN PLASMA GLUCOSE: 128 mg/dL

## 2015-09-23 LAB — PHOSPHORUS: PHOSPHORUS: 7.2 mg/dL — AB (ref 2.5–4.6)

## 2015-09-23 MED ORDER — SODIUM CHLORIDE 0.9 % IJ SOLN
10.0000 mL | Freq: Two times a day (BID) | INTRAMUSCULAR | Status: DC
  Administered 2015-09-23: 10 mL

## 2015-09-23 MED ORDER — AMLODIPINE BESYLATE 5 MG PO TABS
5.0000 mg | ORAL_TABLET | Freq: Every day | ORAL | Status: DC
  Filled 2015-09-23: qty 1

## 2015-09-23 MED ORDER — ISOSORBIDE MONONITRATE ER 60 MG PO TB24: 60.0000 mg | ORAL_TABLET | Freq: Every day | ORAL | Status: AC

## 2015-09-23 MED ORDER — SODIUM CHLORIDE 0.9 % IJ SOLN
10.0000 mL | INTRAMUSCULAR | Status: DC | PRN
  Administered 2015-09-23: 10 mL
  Administered 2015-09-24: 20 mL
  Filled 2015-09-23 (×2): qty 40

## 2015-09-23 MED ORDER — ATORVASTATIN CALCIUM 80 MG PO TABS
80.0000 mg | ORAL_TABLET | Freq: Every day | ORAL | Status: DC
Start: 2015-09-23 — End: 2016-04-06

## 2015-09-23 NOTE — Progress Notes (Signed)
Subjective: Interval History: has no complaint , eating better.  Objective: Vital signs in last 24 hours: Temp:  [97.7 F (36.5 C)-98.1 F (36.7 C)] 98.1 F (36.7 C) (11/25 0611) Pulse Rate:  [66-71] 69 (11/25 0933) Resp:  [16-20] 16 (11/25 0611) BP: (136-191)/(31-47) 161/36 mmHg (11/25 0933) SpO2:  [95 %-100 %] 95 % (11/25 0853) Weight:  [68.2 kg (150 lb 5.7 oz)] 68.2 kg (150 lb 5.7 oz) (11/25 0700) Weight change: 6.193 kg (13 lb 10.5 oz)  Intake/Output from previous day: 11/24 0701 - 11/25 0700 In: 322 [P.O.:222; IV Piggyback:100] Out: 800 [Urine:800] Intake/Output this shift: Total I/O In: -  Out: 500 [Urine:500]  General appearance: alert, cooperative, cachectic, pale and slowed mentation Resp: diminished breath sounds bilaterally and rales bibasilar Cardio: S1, S2 normal and systolic murmur: holosystolic 2/6, blowing at apex GI: pos bs, soft, liver down 5 cm Extremities: bilat fem bruits, abdm bruit  Lab Results:  Recent Labs  09/22/15 0234 09/23/15 0403  WBC 9.8 7.9  HGB 7.8* 7.7*  HCT 23.5* 23.3*  PLT 236 201   BMET:  Recent Labs  09/22/15 0234 09/23/15 0403  NA 135 137  K 4.3 3.7  CL 100* 103  CO2 24 22  GLUCOSE 112* 125*  BUN 120* 128*  CREATININE 4.14* 3.66*  CALCIUM 8.6* 8.8*    Recent Labs  09/21/15 1612  PTH 190*   Iron Studies:  Recent Labs  09/21/15 1612  IRON 45  TIBC 259    Studies/Results: Dg Chest 2 View  09/21/2015  CLINICAL DATA:  Pneumonia.  Hypertension. EXAM: CHEST  2 VIEW COMPARISON:  September 18, 2015 FINDINGS: There is slight atelectasis in the left base. The lungs elsewhere are clear. The heart size and pulmonary vascularity are normal. There is no appreciable adenopathy. Central catheter tip is in the superior vena cava. No pneumothorax. Bones are osteoporotic. There is atherosclerotic calcification in the aorta. IMPRESSION: Mild left base atelectasis. Lungs elsewhere clear. Bones osteoporotic. Electronically Signed    By: Bretta BangWilliam  Woodruff III M.D.   On: 09/21/2015 17:02    I have reviewed the patient's current medications.  Assessment/Plan: 1 CKD4-5  Stable to better.  Acid/base/K ok.  bp ^, add meds. Wants HD when needs come. Will map 2 HTN add amlod 3 anemia given Fe/esa  ? GI 4 HPTH meds 5 COPD 6 CAD 7 PVD P map, add norvasc,  Ok to go to SNF when avail from my standpoint    LOS: 12 days   Carolann Brazell L 09/23/2015,10:11 AM

## 2015-09-23 NOTE — Progress Notes (Signed)
PROGRESS NOTE  [name] VWU:[id number] DOB: 09/07/1930 DOA: 09/11/2015 PCP: [name],[name], MD  Brief narrative 81 year old male with stage IV chronic kidney disease, hypertension, coronary artery disease, diabetes mellitus, hyperlipidemia, peripheral vascular disease who presented to the ED on 11/13 with acute respiratory failure with acute pulmonary edema, hypertensive crisis and NST EMI with community-acquired pneumonia. Patient was transferred to ICU and was intubated for acute respiratory failure with cardiac ischemia. He was aggressively diuresed but developed renal failure and started on CRRT. Patient did not show any clinical improvement and after discussion with family (as per patient's wishes) he was extubated and dialysis discontinued. He was then moved to medical floor with goal for comfort. Patient transferred to medical floor. However over the past 48 hours patient has shown remarkable stability, and improvement. Palliative medicine was reconsulted. The patient remains DO NOT RESUSCITATE, but desires full scope of care. As result, nephrology and cardiology were reconsulted. The patient's renal function continued to improve and stabilized. He did not need any dialysis. He was cleared for discharge by nephrology. Cardiology felt that as long as the patient was stable and improving no further interventions were necessary.  SIGNIFICANT EVENTS: 11/13 - Admit  11/13 - Intubation  11/15 - Self-extubation 11/16 - Reintubation & HD catheter placement for CVVHD 11/20 CRRT stopped/ no further aggressive care. Extubated.   Assessment/Plan: Acute respiratory failure with hypoxia secondary to pulmonary edema and pneumonia. -Admitted to ICU requiring intubation.  -self-extubated 09/13/15 with goal for comfort.  -Completed antibiotics.  -Continue O2 via nasal cannula.  -He is currently euvolemic and maintaining O2 sat on 3 L via nasal cannula.  NSTEMI Has underlying  coronary artery disease.  -09/12/2015 Troponin peaked at 18.51.  -2-D echo showing EF of 65 and 70% with no wall motion abnormality. - IV heparin discontinued on 09/13/15 after MI was resolving/troponin trending down.  -Continue plavix.  -reconsult cardiology--discussed with Dr. Crawford GivensGanji--no further interventions at this time as long as the patient is stable and clinically improving -Continue Lipitor 80 mg daily -continue metoprolol succinate -am lipid panel--LDL 48  Acute diastolic CHF - echo on 09/11/2015 showed EF of 65% to 70% with Grade 1 Diastolic Dysfunction.  - BNP 2200 and weight 155 on admission.  -Daily weights -Currently euvolemic -09/21/2015 chest x-ray without pulmonary edema -Weight on admission--160 pounds -09/22/2015 weight--136 pounds -Continue metoprolol succinate and Imdur -Add hydralazine 25 mg twice a day  Hematuria -secondary to foley trauma -09/20/15--foley was accidentally pulled upon -09/22/2015--hematuria improved  Septic shock secondary to community acquired pneumonia -Lactic acid on admission of 6.69.  -Now resolved.  -Completed 7 day course of Rocephin and azithromycin.  Anemia with Hemoccult+ -Received 2 unit PRBC since admission -trend Hgb--remains stable without any signs of active bleeding -Baseline hemoglobin approximately 8  chronic kidney disease stage IV -CRRT started on 11/16 due to volume overload and oliguric renal failure. -taken off CRRT 09/14/15 and made full comfort. -However, after transfer to the medical floor, the patient improved clinically and nephrology was reconsulted  -His baseline creatinine approx 4.  -appreciate renal follow up--discussed with Dr. Hadassah Paiseterding--no further interventions at this time. -pt now agreeable for chronic HD if needed  COPD ->50 pk year history -stable on 3 L [place] -start aerosolized albuterol and atrovent  Diabetes mellitus type 2 -07/13/2015 hemoglobin A1c 6.3 -NovoLog sliding  scale -Previously took Januvia in the outpatient setting  Hypertension -Continue metoprolol succinate and Imdur -Continue hydralazine -Add amlodipine  DVT prophylaxis: lovenox   Code Status: FULL CODE--Dr. Jacinto Halim discussed with daughter Family Communication: GrandDaughter updated at bedside Disposition Plan: SNF 11/25  Consultants:  [facility] CM  Cardiology  Renal  Palliative care  STUDIES:  ECHO 11/13 >> EF 65 to 70%, grade 1 diastolic dysfx, PAS 65 mmHg .  PROCEDURES R Subclavian CVC 11/13>>> R IJ HD Catheter 11/16>>>    Procedures/Studies: Dg Chest 2 View  09/21/2015  CLINICAL DATA:  Pneumonia.  Hypertension. EXAM: CHEST  2 VIEW COMPARISON:  September 18, 2015 FINDINGS: There is slight atelectasis in the left base. The lungs elsewhere are clear. The heart size and pulmonary vascularity are normal. There is no appreciable adenopathy. Central catheter tip is in the superior vena cava. No pneumothorax. Bones are osteoporotic. There is atherosclerotic calcification in the aorta. IMPRESSION: Mild left base atelectasis. Lungs elsewhere clear. Bones osteoporotic. Electronically Signed   By: Bretta Bang III M.D.   On: 09/21/2015 17:02   Dg Abd 1 View  09/15/2015  CLINICAL DATA:  Feeding tube placement. EXAM: ABDOMEN - 1 VIEW COMPARISON:  09/14/2015 and earlier 09/15/2015. FINDINGS: Nasogastric tube is present extending to the distal esophagus were turns 180 degrees and courses back up into the more proximal esophagus as tip is not visualized. Remainder of the exam is unchanged. IMPRESSION: Nasogastric tube courses to the distal esophagus, turns 180 degrees extending back up into the proximal esophagus as tip is not visualized. Care team aware of NG tube position as it has already been repositioned and awaiting imaging. Spoke with patient's nurse, Elissa Lovett, regarding the above findings. Electronically Signed   By: Elberta Fortis M.D.   On: 09/15/2015 10:20   Dg Abd 1  View  09/15/2015  CLINICAL DATA:  Feeding tube placement. EXAM: ABDOMEN - 1 VIEW COMPARISON:  09/14/2015 FINDINGS: Patient is rotated to the right. Nasogastric tube courses into the stomach and is looped over the right upper quadrant and courses back up into the distal esophagus with tip over the distal esophagus. Nonobstructive bowel gas pattern. Stable multifocal airspace process within the visualized lungs. Degenerative change of the spine. Calcified plaque over the abdominal aorta. IMPRESSION: Nonobstructive bowel gas pattern. Nasogastric tube is coiled within the stomach over the right upper quadrant and courses back into the distal esophagus with tip over the distal esophagus. Note that the Care team is aware of this finding as subsequent images have already been obtained with tube repositioning. Electronically Signed   By: Elberta Fortis M.D.   On: 09/15/2015 10:15   Dg Chest Port 1 View  09/18/2015  CLINICAL DATA:  Pneumonia, shortness of breath. EXAM: PORTABLE CHEST 1 VIEW COMPARISON:  09/15/2015 FINDINGS: Endotracheal tube is in stable position, 3 cm superior to the carina. Right PICC line terminates at the expected location of cavoatrial junction. Large bore central venous catheter versus sheath from right internal jugular approach is stable in appearance, terminating within the expected location of superior vena cava. Feeding catheter is partially visualized, excluded by collimation. Cardiomediastinal silhouette is normal. Mediastinal contours appear intact. There is no evidence of pleural effusion or pneumothorax. Lower lung fields excluded by collimation, but there is improved aeration of the visualized portions of the lungs. Osseous structures are without acute abnormality. Soft tissues are grossly normal. IMPRESSION: Improved aeration of the visualized portions of the lungs. Lines and tubes as above. Electronically Signed   By: Ted Mcalpine M.D.   On: 09/18/2015 08:54   Portable Chest  Xray  09/15/2015  CLINICAL  DATA:  Respiratory failure, shortness of breath. EXAM: PORTABLE CHEST 1 VIEW COMPARISON:  September 14, 2015. FINDINGS: Stable cardiomediastinal silhouette. Endotracheal tube is in grossly good position and unchanged compared to prior exam. Right internal jugular and subclavian catheters are unchanged in position. No pneumothorax or significant pleural effusion is noted. Stable bilateral lung opacities are noted concerning for edema or pneumonia. Bony thorax is unremarkable. IMPRESSION: Stable support apparatus. Stable bilateral lung opacities as described above. Electronically Signed   By: Lupita Raider, M.D.   On: 09/15/2015 07:22   Dg Chest Portable 1 View  09/14/2015  CLINICAL DATA:  Dialysis catheter and endotracheal tube placement EXAM: PORTABLE CHEST 1 VIEW COMPARISON:  09/12/2015 FINDINGS: Endotracheal to and right-sided venous line are unchanged. Interval reduction of a RIGHT internal jugular catheter tip in distal SVC. No pneumothorax. There is diffuse bilateral fine airspace disease.  No consolidation. IMPRESSION: 1. Placement of RIGHT dialysis catheter without pneumothorax. 2. Endotracheal tube in good position. 3. Diffuse airspace disease consistent with pulmonary edema. No change. Electronically Signed   By: Genevive Bi M.D.   On: 09/14/2015 13:54   Dg Chest Port 1 View  09/12/2015  CLINICAL DATA:  RN states she is giving lasix and checking for fluid overload, pt has ETT, OGT, Rt subclavian line, hx htn, diabetes. Just tested positive for the flu and put on droplet precaution. EXAM: PORTABLE CHEST 1 VIEW COMPARISON:  09/11/2015 FINDINGS: Bilateral airspace opacities are less confluent than on the previous day's study. There are no new lung opacities. Cardiac silhouette is top-normal in size. No mediastinal or hilar masses or evidence of adenopathy. No pneumothorax. Endotracheal tube tip projects 3.1 cm above the carina. Orogastric tube passes below the  diaphragm into the stomach. This is new from the earlier study. Right subclavian central venous line tip projects just above the caval atrial junction, well positioned. IMPRESSION: 1. Improved lung aeration although there is still significant bilateral airspace opacities, consistent with pulmonary edema. 2. No new abnormalities. 3. Support apparatus is well positioned. Electronically Signed   By: Amie Portland M.D.   On: 09/12/2015 10:21   Portable Chest Xray  09/11/2015  CLINICAL DATA:  Status post intubation and central line placement today. EXAM: PORTABLE CHEST 1 VIEW COMPARISON:  Single view of the chest earlier today. FINDINGS: New right subclavian catheter is seen with the tip projecting over the lower superior vena cava. No pneumothorax is identified. Endotracheal tube is also seen with the tip in good position 4 cm above the carina. Right worse than left airspace disease has increased since the study earlier today. No pneumothorax is identified. Defibrillator pads are in place. IMPRESSION: ET tube and right subclavian catheter project in good position. Negative for pneumothorax. Worsened right greater than left airspace disease could be due to asymmetric edema or pneumonia. Electronically Signed   By: Drusilla Kanner M.D.   On: 09/11/2015 16:39   Dg Chest Port 1 View  09/11/2015  CLINICAL DATA:  Shortness of breath, sepsis EXAM: PORTABLE CHEST 1 VIEW COMPARISON:  09/11/2015 FINDINGS: Defibrillator pads and monitor leads overlie the chest. Asymmetric mid and lower lung airspace process, worse on the right compatible with asymmetric edema or pneumonia. Heart remains enlarged. No developing significant effusion or pneumothorax. Trachea midline. Aortic atherosclerotic. No significant interval change. IMPRESSION: Persistent mid and lower lung asymmetric airspace process worse on the right compatible with pneumonia or asymmetric edema. Electronically Signed   By: Judie Petit.  Shick M.D.   On: 09/11/2015 12:38  Dg Chest Port 1 View  09/11/2015  CLINICAL DATA:  Pt arrives from home via [facility]. Pt states he began having SOB last night and wasn't able to lay down and still breathe. Pt is extremely labored upon arrival and is using accessory muscles. Pt has bilateral coarse crackles. Pt states he is in "borderline kidney failure" and quit smoking 30 years ago EXAM: PORTABLE CHEST 1 VIEW COMPARISON:  03/21/2015 FINDINGS: Bilateral airspace opacity has developed in the mid to lower lungs, right greater than left. The upper lungs are clear. Cardiac silhouette is normal in size. No mediastinal or hilar masses. No definite pleural effusion and no pneumothorax. Bony thorax is demineralized but grossly intact. IMPRESSION: Bilateral mid to lower lung zone airspace opacities, right greater than left. This may reflect multifocal pneumonia. Pulmonary edema is possible, with pneumonia favored. Electronically Signed   By: Amie Portland M.D.   On: 09/11/2015 10:07   Dg Abd Portable 1v  09/15/2015  CLINICAL DATA:  NG tube placement EXAM: PORTABLE ABDOMEN - 1 VIEW COMPARISON:  09/15/2015 FINDINGS: Feeding tube tip in the proximal duodenum. Normal bowel gas pattern. IMPRESSION: Feeding tube tip in the proximal duodenum. Electronically Signed   By: Marlan Palau M.D.   On: 09/15/2015 13:30   Dg Abd Portable 1v  09/15/2015  CLINICAL DATA:  NG placement EXAM: PORTABLE ABDOMEN - 1 VIEW COMPARISON:  09/15/2015 FINDINGS: NG tube is coiled within the esophagus and does not enter the stomach. The NG tube is been pulled back slightly since earlier today. Colonic gas is unchanged. Diffuse bilateral lung infiltrates unchanged IMPRESSION: NG tube coiled in the esophagus Electronically Signed   By: Marlan Palau M.D.   On: 09/15/2015 10:31   Dg Abd Portable 1v  09/14/2015  CLINICAL DATA:  Orogastric tube advancement. Initial encounter. For lab EXAM: PORTABLE ABDOMEN - 1 VIEW COMPARISON:  Abdominal radiograph performed earlier today at  9:37 p.m. FINDINGS: The patient's endotracheal tube has been advanced, now seen ending just above the carina. This should be retracted 2-3 cm. The enteric tube is noted ending at the distal esophagus. This should be advanced at least 9 cm. The visualized bowel gas pattern is difficult to fully assess due to motion artifact. Diffuse bilateral airspace opacification is again noted. No acute osseous abnormalities are seen. IMPRESSION: 1. Endotracheal tube has been advanced, now seen ending just above the carina. This should be retracted 2-3 cm. 2. Enteric tube noted ending at the distal esophagus. This should be advanced at least 9 cm. 3. Diffuse bilateral airspace opacification again noted. These results were called by telephone at the time of interpretation on 09/14/2015 at 11:16 pm to [facility], who verbally acknowledged these results. Electronically Signed   By: Roanna Raider M.D.   On: 09/14/2015 23:17   Dg Abd Portable 1v  09/14/2015  CLINICAL DATA:  Orogastric tube placement.  Initial encounter. EXAM: PORTABLE ABDOMEN - 1 VIEW COMPARISON:  Abdominal radiograph performed 09/11/2015 FINDINGS: The patient's enteric tube is noted ending overlying the gastroesophageal junction, with the side port at the distal esophagus. This could be advanced at least 6 cm. Diffuse bilateral airspace opacification may reflect pulmonary edema or pneumonia. ARDS cannot be excluded. The patient's endotracheal tube is seen ending 3 cm above the carina. A right IJ line is noted ending about the mid SVC. The visualized bowel gas pattern is grossly unremarkable. No acute osseous abnormalities are seen. IMPRESSION: 1. Enteric tube noted ending overlying the gastroesophageal junction, with the  side port at the distal esophagus. This could be advanced at least 6 cm. 2. Diffuse bilateral airspace opacification may reflect pulmonary edema or pneumonia. ARDS cannot be excluded. Electronically Signed   By: Roanna Raider M.D.   On:  09/14/2015 21:53   Dg Abd Portable 1v  09/11/2015  CLINICAL DATA:  Check OG tube placement EXAM: PORTABLE ABDOMEN - 1 VIEW COMPARISON:  03/27/2015 FINDINGS: Scattered large and small bowel gas is noted. A gastric catheter is noted within the stomach. No bony abnormality is seen. Left iliac arterial stenting is noted. IMPRESSION: Gastric catheter in satisfactory position. Electronically Signed   By: Alcide Clever M.D.   On: 09/11/2015 17:33         Subjective: Patient denies fevers, chills, headache, chest pain, dyspnea, nausea, vomiting, diarrhea, abdominal pain, dysuria, hematuria   Objective: Filed Vitals:   09/23/15 0723 09/23/15 0853 09/23/15 0933 09/23/15 1242  BP: 191/35  161/36 133/34  Pulse: 66  69 62  Temp:      TempSrc:      Resp:    22  Height:      Weight:      SpO2:  95%  97%    Intake/Output Summary (Last 24 hours) at 09/23/15 1637 Last data filed at 09/23/15 1440  Gross per 24 hour  Intake    320 ml  Output    900 ml  Net   -580 ml   Weight change: 6.193 kg (13 lb 10.5 oz) Exam:   General:  Pt is alert, follows commands appropriately, not in acute distress  HEENT: No icterus, No thrush, No neck mass, [place]/AT  Cardiovascular: RRR, S1/S2, no rubs, no gallops  Respiratory: CTA bilaterally, no wheezing, no crackles, no rhonchi  Abdomen: Soft/+BS, non tender, non distended, no guarding  Extremities: No edema, No lymphangitis, No petechiae, No rashes, no synovitis  Data Reviewed: Basic Metabolic Panel:  Recent Labs Lab 09/17/15 0410 09/17/15 1550 09/18/15 0420 09/21/15 0435 09/22/15 0234 09/23/15 0403  NA 139 137 137 136 135 137  K 4.7 5.1 5.2* 4.2 4.3 3.7  CL 103 102 102 100* 100* 103  CO2 GLUCOSE 131* 81 131* 145* 112* 125*  BUN 24* 19 21* 113* 120* 128*  CREATININE 1.37* 1.19 1.29* 4.39* 4.14* 3.66*  CALCIUM 8.1* 8.3* 8.0* 8.9 8.6* 8.8*  MG 2.6*  --  2.4  --   --   --   PHOS 2.2* 2.6 1.9*  --  8.1* 7.2*   Liver  Function Tests:  Recent Labs Lab 09/17/15 0410 09/17/15 1550 09/18/15 0420 09/22/15 0234 09/23/15 0403  AST  --   --   --  36 29  ALT  --   --   --  41 35  ALKPHOS  --   --   --  91 85  BILITOT  --   --   --  0.5 0.9  PROT  --   --   --  4.9* 4.9*  ALBUMIN 2.2* 2.4* 2.2* 2.4* 2.4*   No results for input(s): LIPASE, AMYLASE in the last 168 hours. No results for input(s): AMMONIA in the last 168 hours. CBC:  Recent Labs Lab 09/17/15 0410 09/17/15 1550 09/18/15 0420 09/21/15 0435 09/22/15 0234 09/23/15 0403  WBC 11.0* 8.4 13.1* 13.2* 9.8 7.9  NEUTROABS 8.5* 6.6 10.4*  --   --   --   HGB 8.6* 8.7* 8.8* 8.1* 7.8* 7.7*  HCT 26.6* 27.5* 27.0* 24.6*  23.5* 23.3*  MCV 92.4 92.9 93.4 91.1 90.7 90.3  PLT 146* 137* 130* 228 236 201   Cardiac Enzymes: No results for input(s): CKTOTAL, CKMB, CKMBINDEX, TROPONINI in the last 168 hours. BNP: Invalid input(s): POCBNP CBG:  Recent Labs Lab 09/22/15 1206 09/22/15 1655 09/22/15 2114 09/23/15 0811 09/23/15 1158  GLUCAP 212* 152* 110* 151* 158*    No results found for this or any previous visit (from the past 240 hour(s)).   Scheduled Meds: . amLODipine  5 mg Oral QHS  . atorvastatin  80 mg Oral q1800  . calcitRIOL  0.25 mcg Oral Daily  . clopidogrel  75 mg Oral Daily  . darbepoetin (ARANESP) injection - NON-DIALYSIS  200 mcg Subcutaneous Q Wed-1800  . enoxaparin (LOVENOX) injection  30 mg Subcutaneous Q24H  . ferumoxytol  510 mg Intravenous Weekly  . hydrALAZINE  25 mg Oral BID  . insulin aspart  0-9 Units Subcutaneous TID WC  . ipratropium-albuterol  3 mL Nebulization BID  . isosorbide mononitrate  60 mg Oral Daily  . metoprolol succinate  50 mg Oral Daily  . sodium chloride  10-40 mL Intracatheter Q12H  . sucroferric oxyhydroxide  500 mg Oral TID WC   Continuous Infusions:    Kanyon Seibold, DO  [facility] Pager [phone]  If 7PM-7AM, please contact night-coverage [email or web address] Password [facility] 09/23/2015,  4:37 PM   LOS: 12 days

## 2015-09-23 NOTE — Discharge Summary (Addendum)
Physician Discharge Summary  [name] ZOX:[id number] DOB: 10/26/1930 DOA: 09/11/2015  PCP: [name],[name], MD  Admit date: 09/11/2015 Discharge date: 09/24/15 Recommendations for Outpatient Follow-up:  1. Pt will need to follow up with PCP in 2 weeks post discharge 2. Please obtain BMP on 09/28/15  Discharge Diagnoses:  Acute respiratory failure with hypoxia secondary to pulmonary edema and pneumonia. -Admitted to ICU requiring intubation.  -self-extubated 09/13/15 with goal for comfort as pt was initially transitioned to comfort care/DNR  -After extubation and transfer to the medical floor, the patient made a remarkable recovery -He was subsequently weaned to room air and remained stable with significant improvement in his mental status returning back to baseline -Completed antibiotics.  -He is currently euvolemic and he was subsequently weaned to RA and remained clinically stable  NSTEMI Has underlying coronary artery disease.  -09/12/2015 Troponin peaked at 18.51.  -2-D echo showing EF of 65 and 70% with no wall motion abnormality. - IV heparin discontinued on 09/13/15 after MI was resolving/troponin trending down.  -Continue plavix and ASA  -reconsult cardiology--discussed with Dr. Crawford GivensGanji--no further interventions at this time as long as the patient is stable and clinically improving -Continue Lipitor 80 mg daily -continue metoprolol succinate -am lipid panel--LDL 48  Acute diastolic CHF - echo on 09/11/2015 showed EF of 65% to 70% with Grade 1 Diastolic Dysfunction.  - BNP 2200 and weight 155 on admission.  -Daily weights -Currently euvolemic -09/21/2015 chest x-ray without pulmonary edema -Weight on admission--160 pounds -Discharge weight--150 pounds -Continue metoprolol succinate and Imdur -Add hydralazine 25 mg twice a day -1500 cc daily fluid restriction  Hematuria -secondary to foley trauma -09/20/15--foley was accidentally pulled  upon -09/22/2015--hematuria improved  Septic shock secondary to community acquired pneumonia -Lactic acid on admission of 6.69.  -Now resolved.  -Completed 7 day course of Rocephin and azithromycin.  Anemia with Hemoccult+ -Received 2 unit PRBC since admission -trend Hgb--remains stable without any signs of active bleeding -Hgb 7.8 on day of d/c -Baseline hemoglobin approximately 8  chronic kidney disease stage IV -CRRT started on 11/16 due to volume overload and oliguric renal failure. -taken off CRRT 09/14/15 and made full comfort. -However, after transfer to the medical floor, the patient improved clinically and nephrology was reconsulted  -previous baseline creatinine approx 4.  -serum creatinine 3.14 on day of d/c -appreciate renal follow up--discussed with Dr. Hadassah Paiseterding--no further interventions at this time as long as pt is stable and improving -pt now agreeable for chronic HD if needed -furosemide 80 mg daily added by renal  COPD ->50 pk year history -stable on 3 L [place]--> subsequently weaned to room air without any competitions -start aerosolized albuterol and atrovent  Diabetes mellitus type 2 -07/13/2015 hemoglobin A1c 6.3 -09/22/2015 hemoglobin A1c 6.1 -NovoLog sliding scale -Previously took Januvia in the outpatient setting-->restart after d/c  Hypertension -Continue metoprolol succinate and Imdur -Continue hydralazine -Add amlodipine 10 mg daily  DVT prophylaxis: lovenox   Code Status: FULL CODE--Dr. Jacinto HalimGanji discussed with daughter Family Communication: GrandDaughter updated at bedside Disposition Plan: SNF 11/25  [facility]  Cardiology  Renal  Palliative care  STUDIES:  ECHO 11/13 >> EF 65 to 70%, grade 1 diastolic dysfx, PAS 65 mmHg .  PROCEDURES R Subclavian CVC 11/13>>> R IJ HD Catheter 11/16>>>  Discharge Condition: stable  Disposition:  Follow-up Information    Follow up with DETERDING,JAMES L, MD In 2 weeks.    Specialty:  Nephrology   Contact information:   [address]  [place] [address] [phone]       Follow up with [facility] .   Specialty:  Skilled Nursing Facility   Contact information:   [address] [place] [address] [phone]    SNF  Diet:heart healthy Wt Readings from Last 3 Encounters:  09/24/15 68.4 kg (150 lb 12.7 oz)  07/20/15 73.573 kg (162 lb 3.2 oz)  06/02/15 74.39 kg (164 lb)    History of present illness:  81 year old male with stage IV chronic kidney disease, hypertension, coronary artery disease, diabetes mellitus, hyperlipidemia, peripheral vascular disease who presented to the ED on 11/13 with acute respiratory failure with acute pulmonary edema, hypertensive crisis and NST EMI with community-acquired pneumonia. Patient was transferred to ICU and was intubated for acute respiratory failure with cardiac ischemia. He was aggressively diuresed but developed renal failure and started on CRRT. Patient did not show any clinical improvement and after discussion with family (as per patient's wishes) he was extubated and dialysis discontinued. He was then moved to medical floor with goal for comfort. Patient transferred to medical floor. However, after past 48 hours patient has shown remarkable stability, and improvement. Palliative medicine was reconsulted. The patient was changed to FULL CODE. As result, nephrology and cardiology were reconsulted. The patient's renal function continued to improve. Nephrology continued to follow the patient and felt that the patient was stable for discharge and did not need further dialysis. The case was discussed with cardiology who felt that medical therapy was in the patient's best interest as long as the patient remained stable and improving.  SIGNIFICANT EVENTS: 11/13 - Admit  11/13 - Intubation  11/15 - Self-extubation 11/16 - Reintubation & HD catheter placement for CVVHD 11/20 CRRT  stopped/ no further aggressive care. Extubated.   Consultants: Nephrology Cardiology--Ganji  Discharge Exam: Filed Vitals:   09/24/15 0651 09/24/15 1347  BP: 172/37 143/46  Pulse: 64 65  Temp: 97.5 F (36.4 C) 97.8 F (36.6 C)  Resp: 18 18   Filed Vitals:   09/23/15 2124 09/24/15 0651 09/24/15 0727 09/24/15 1347  BP: 159/26 172/37  143/46  Pulse: 70 64  65  Temp:  97.5 F (36.4 C)  97.8 F (36.6 C)  TempSrc:  Oral  Oral  Resp:  18  18  Height:      Weight:  68.4 kg (150 lb 12.7 oz)    SpO2:  94% 94% 98%   General: A&O x 3, NAD, pleasant, cooperative Cardiovascular: RRR, no rub, no gallop, no S3 Respiratory: CTAB, no wheeze, no rhonchi Abdomen:soft, nontender, nondistended, positive bowel sounds Extremities: No edema, No lymphangitis, no petechiae  Discharge Instructions      Discharge Instructions    Diet - low sodium heart healthy    Complete by:  As directed      Increase activity slowly    Complete by:  As directed             Medication List    STOP taking these medications        cloNIDine 0.1 MG tablet  Commonly known as:  CATAPRES     Potassium Chloride ER 20 MEQ Tbcr     terazosin 10 MG capsule  Commonly known as:  HYTRIN      TAKE these medications        ACCU-CHEK FASTCLIX LANCETS Misc  Use to check blood sugar 2 times per day dx code E11.9     allopurinol 100 MG tablet  Commonly known as:  ZYLOPRIM  TAKE 1 TABLET (100 MG TOTAL) BY MOUTH AT BEDTIME.     amLODipine 10 MG tablet  Commonly known as:  NORVASC  Take 1 tablet (10 mg total) by mouth at bedtime.     aspirin EC 81 MG tablet  Take 81 mg by mouth every evening.     atorvastatin 80 MG tablet  Commonly known as:  LIPITOR  Take 1 tablet (80 mg total) by mouth daily at 6 PM.     calcitRIOL 0.25 MCG capsule  Commonly known as:  ROCALTROL  Take 0.25 mcg by mouth every morning.     cholecalciferol 1000 UNITS tablet  Commonly known as:  VITAMIN D  Take 1,000 Units by mouth  2 (two) times daily.     clopidogrel 75 MG tablet  Commonly known as:  PLAVIX  TAKE 1 TABLET BY MOUTH DAILY     feeding supplement Liqd  Take 1 Container by mouth 3 (three) times daily between meals.     furosemide 80 MG tablet  Commonly known as:  LASIX  Take 1 tablet (80 mg total) by mouth daily.     glucose blood test strip  Commonly known as:  ACCU-CHEK AVIVA PLUS  Use as instructed to check blood sugar 2 times per day dx code E11.9     hydrALAZINE 50 MG tablet  Commonly known as:  APRESOLINE  Take 1 tablet (50 mg total) by mouth 2 (two) times daily.     isosorbide mononitrate 60 MG 24 hr tablet  Commonly known as:  IMDUR  Take 1 tablet (60 mg total) by mouth daily.     metoprolol succinate 50 MG 24 hr tablet  Commonly known as:  TOPROL-XL  Take 1 tablet (50 mg total) by mouth daily. Take with or immediately following a meal.     pantoprazole 40 MG tablet  Commonly known as:  PROTONIX  Take 1 tablet (40 mg total) by mouth daily.     polyethylene glycol packet  Commonly known as:  MIRALAX / GLYCOLAX  Take 17 g by mouth 2 (two) times daily.     polyethylene glycol packet  Commonly known as:  MIRALAX / GLYCOLAX  Take 17 g by mouth daily as needed for mild constipation.     PRESERVISION AREDS PO  Take 2 tablets by mouth daily.     sitaGLIPtin 25 MG tablet  Commonly known as:  JANUVIA  Take 1 tablet (25 mg total) by mouth daily.         The results of significant diagnostics from this hospitalization (including imaging, microbiology, ancillary and laboratory) are listed below for reference.    Significant Diagnostic Studies: Dg Chest 2 View  09/21/2015  CLINICAL DATA:  Pneumonia.  Hypertension. EXAM: CHEST  2 VIEW COMPARISON:  September 18, 2015 FINDINGS: There is slight atelectasis in the left base. The lungs elsewhere are clear. The heart size and pulmonary vascularity are normal. There is no appreciable adenopathy. Central catheter tip is in the superior vena  cava. No pneumothorax. Bones are osteoporotic. There is atherosclerotic calcification in the aorta. IMPRESSION: Mild left base atelectasis. Lungs elsewhere clear. Bones osteoporotic. Electronically Signed   By: Bretta Bang III M.D.   On: 09/21/2015 17:02   Dg Abd 1 View  09/15/2015  CLINICAL DATA:  Feeding tube placement. EXAM: ABDOMEN - 1 VIEW COMPARISON:  09/14/2015 and earlier 09/15/2015. FINDINGS: Nasogastric tube is present extending to the distal esophagus were turns 180 degrees and courses back up  into the more proximal esophagus as tip is not visualized. Remainder of the exam is unchanged. IMPRESSION: Nasogastric tube courses to the distal esophagus, turns 180 degrees extending back up into the proximal esophagus as tip is not visualized. Care team aware of NG tube position as it has already been repositioned and awaiting imaging. Spoke with patient's nurse, Elissa Lovett, regarding the above findings. Electronically Signed   By: Elberta Fortis M.D.   On: 09/15/2015 10:20   Dg Abd 1 View  09/15/2015  CLINICAL DATA:  Feeding tube placement. EXAM: ABDOMEN - 1 VIEW COMPARISON:  09/14/2015 FINDINGS: Patient is rotated to the right. Nasogastric tube courses into the stomach and is looped over the right upper quadrant and courses back up into the distal esophagus with tip over the distal esophagus. Nonobstructive bowel gas pattern. Stable multifocal airspace process within the visualized lungs. Degenerative change of the spine. Calcified plaque over the abdominal aorta. IMPRESSION: Nonobstructive bowel gas pattern. Nasogastric tube is coiled within the stomach over the right upper quadrant and courses back into the distal esophagus with tip over the distal esophagus. Note that the Care team is aware of this finding as subsequent images have already been obtained with tube repositioning. Electronically Signed   By: Elberta Fortis M.D.   On: 09/15/2015 10:15   Dg Chest Port 1 View  09/18/2015   CLINICAL DATA:  Pneumonia, shortness of breath. EXAM: PORTABLE CHEST 1 VIEW COMPARISON:  09/15/2015 FINDINGS: Endotracheal tube is in stable position, 3 cm superior to the carina. Right PICC line terminates at the expected location of cavoatrial junction. Large bore central venous catheter versus sheath from right internal jugular approach is stable in appearance, terminating within the expected location of superior vena cava. Feeding catheter is partially visualized, excluded by collimation. Cardiomediastinal silhouette is normal. Mediastinal contours appear intact. There is no evidence of pleural effusion or pneumothorax. Lower lung fields excluded by collimation, but there is improved aeration of the visualized portions of the lungs. Osseous structures are without acute abnormality. Soft tissues are grossly normal. IMPRESSION: Improved aeration of the visualized portions of the lungs. Lines and tubes as above. Electronically Signed   By: Ted Mcalpine M.D.   On: 09/18/2015 08:54   Portable Chest Xray  09/15/2015  CLINICAL DATA:  Respiratory failure, shortness of breath. EXAM: PORTABLE CHEST 1 VIEW COMPARISON:  September 14, 2015. FINDINGS: Stable cardiomediastinal silhouette. Endotracheal tube is in grossly good position and unchanged compared to prior exam. Right internal jugular and subclavian catheters are unchanged in position. No pneumothorax or significant pleural effusion is noted. Stable bilateral lung opacities are noted concerning for edema or pneumonia. Bony thorax is unremarkable. IMPRESSION: Stable support apparatus. Stable bilateral lung opacities as described above. Electronically Signed   By: Lupita Raider, M.D.   On: 09/15/2015 07:22   Dg Chest Portable 1 View  09/14/2015  CLINICAL DATA:  Dialysis catheter and endotracheal tube placement EXAM: PORTABLE CHEST 1 VIEW COMPARISON:  09/12/2015 FINDINGS: Endotracheal to and right-sided venous line are unchanged. Interval reduction of a  RIGHT internal jugular catheter tip in distal SVC. No pneumothorax. There is diffuse bilateral fine airspace disease.  No consolidation. IMPRESSION: 1. Placement of RIGHT dialysis catheter without pneumothorax. 2. Endotracheal tube in good position. 3. Diffuse airspace disease consistent with pulmonary edema. No change. Electronically Signed   By: Genevive Bi M.D.   On: 09/14/2015 13:54   Dg Chest Port 1 View  09/12/2015  CLINICAL DATA:  RN states she  is giving lasix and checking for fluid overload, pt has ETT, OGT, Rt subclavian line, hx htn, diabetes. Just tested positive for the flu and put on droplet precaution. EXAM: PORTABLE CHEST 1 VIEW COMPARISON:  09/11/2015 FINDINGS: Bilateral airspace opacities are less confluent than on the previous day's study. There are no new lung opacities. Cardiac silhouette is top-normal in size. No mediastinal or hilar masses or evidence of adenopathy. No pneumothorax. Endotracheal tube tip projects 3.1 cm above the carina. Orogastric tube passes below the diaphragm into the stomach. This is new from the earlier study. Right subclavian central venous line tip projects just above the caval atrial junction, well positioned. IMPRESSION: 1. Improved lung aeration although there is still significant bilateral airspace opacities, consistent with pulmonary edema. 2. No new abnormalities. 3. Support apparatus is well positioned. Electronically Signed   By: Amie Portland M.D.   On: 09/12/2015 10:21   Portable Chest Xray  09/11/2015  CLINICAL DATA:  Status post intubation and central line placement today. EXAM: PORTABLE CHEST 1 VIEW COMPARISON:  Single view of the chest earlier today. FINDINGS: New right subclavian catheter is seen with the tip projecting over the lower superior vena cava. No pneumothorax is identified. Endotracheal tube is also seen with the tip in good position 4 cm above the carina. Right worse than left airspace disease has increased since the study earlier  today. No pneumothorax is identified. Defibrillator pads are in place. IMPRESSION: ET tube and right subclavian catheter project in good position. Negative for pneumothorax. Worsened right greater than left airspace disease could be due to asymmetric edema or pneumonia. Electronically Signed   By: Drusilla Kanner M.D.   On: 09/11/2015 16:39   Dg Chest Port 1 View  09/11/2015  CLINICAL DATA:  Shortness of breath, sepsis EXAM: PORTABLE CHEST 1 VIEW COMPARISON:  09/11/2015 FINDINGS: Defibrillator pads and monitor leads overlie the chest. Asymmetric mid and lower lung airspace process, worse on the right compatible with asymmetric edema or pneumonia. Heart remains enlarged. No developing significant effusion or pneumothorax. Trachea midline. Aortic atherosclerotic. No significant interval change. IMPRESSION: Persistent mid and lower lung asymmetric airspace process worse on the right compatible with pneumonia or asymmetric edema. Electronically Signed   By: Judie Petit.  Shick M.D.   On: 09/11/2015 12:38   Dg Chest Port 1 View  09/11/2015  CLINICAL DATA:  Pt arrives from home via [facility]. Pt states he began having SOB last night and wasn't able to lay down and still breathe. Pt is extremely labored upon arrival and is using accessory muscles. Pt has bilateral coarse crackles. Pt states he is in "borderline kidney failure" and quit smoking 30 years ago EXAM: PORTABLE CHEST 1 VIEW COMPARISON:  03/21/2015 FINDINGS: Bilateral airspace opacity has developed in the mid to lower lungs, right greater than left. The upper lungs are clear. Cardiac silhouette is normal in size. No mediastinal or hilar masses. No definite pleural effusion and no pneumothorax. Bony thorax is demineralized but grossly intact. IMPRESSION: Bilateral mid to lower lung zone airspace opacities, right greater than left. This may reflect multifocal pneumonia. Pulmonary edema is possible, with pneumonia favored. Electronically Signed   By: Amie Portland M.D.    On: 09/11/2015 10:07   Dg Abd Portable 1v  09/15/2015  CLINICAL DATA:  NG tube placement EXAM: PORTABLE ABDOMEN - 1 VIEW COMPARISON:  09/15/2015 FINDINGS: Feeding tube tip in the proximal duodenum. Normal bowel gas pattern. IMPRESSION: Feeding tube tip in the proximal duodenum. Electronically Signed   By:  Marlan Palau M.D.   On: 09/15/2015 13:30   Dg Abd Portable 1v  09/15/2015  CLINICAL DATA:  NG placement EXAM: PORTABLE ABDOMEN - 1 VIEW COMPARISON:  09/15/2015 FINDINGS: NG tube is coiled within the esophagus and does not enter the stomach. The NG tube is been pulled back slightly since earlier today. Colonic gas is unchanged. Diffuse bilateral lung infiltrates unchanged IMPRESSION: NG tube coiled in the esophagus Electronically Signed   By: Marlan Palau M.D.   On: 09/15/2015 10:31   Dg Abd Portable 1v  09/14/2015  CLINICAL DATA:  Orogastric tube advancement. Initial encounter. For lab EXAM: PORTABLE ABDOMEN - 1 VIEW COMPARISON:  Abdominal radiograph performed earlier today at 9:37 p.m. FINDINGS: The patient's endotracheal tube has been advanced, now seen ending just above the carina. This should be retracted 2-3 cm. The enteric tube is noted ending at the distal esophagus. This should be advanced at least 9 cm. The visualized bowel gas pattern is difficult to fully assess due to motion artifact. Diffuse bilateral airspace opacification is again noted. No acute osseous abnormalities are seen. IMPRESSION: 1. Endotracheal tube has been advanced, now seen ending just above the carina. This should be retracted 2-3 cm. 2. Enteric tube noted ending at the distal esophagus. This should be advanced at least 9 cm. 3. Diffuse bilateral airspace opacification again noted. These results were called by telephone at the time of interpretation on 09/14/2015 at 11:16 pm to [facility], who verbally acknowledged these results. Electronically Signed   By: Roanna Raider M.D.   On: 09/14/2015 23:17   Dg Abd  Portable 1v  09/14/2015  CLINICAL DATA:  Orogastric tube placement.  Initial encounter. EXAM: PORTABLE ABDOMEN - 1 VIEW COMPARISON:  Abdominal radiograph performed 09/11/2015 FINDINGS: The patient's enteric tube is noted ending overlying the gastroesophageal junction, with the side port at the distal esophagus. This could be advanced at least 6 cm. Diffuse bilateral airspace opacification may reflect pulmonary edema or pneumonia. ARDS cannot be excluded. The patient's endotracheal tube is seen ending 3 cm above the carina. A right IJ line is noted ending about the mid SVC. The visualized bowel gas pattern is grossly unremarkable. No acute osseous abnormalities are seen. IMPRESSION: 1. Enteric tube noted ending overlying the gastroesophageal junction, with the side port at the distal esophagus. This could be advanced at least 6 cm. 2. Diffuse bilateral airspace opacification may reflect pulmonary edema or pneumonia. ARDS cannot be excluded. Electronically Signed   By: Roanna Raider M.D.   On: 09/14/2015 21:53   Dg Abd Portable 1v  09/11/2015  CLINICAL DATA:  Check OG tube placement EXAM: PORTABLE ABDOMEN - 1 VIEW COMPARISON:  03/27/2015 FINDINGS: Scattered large and small bowel gas is noted. A gastric catheter is noted within the stomach. No bony abnormality is seen. Left iliac arterial stenting is noted. IMPRESSION: Gastric catheter in satisfactory position. Electronically Signed   By: Alcide Clever M.D.   On: 09/11/2015 17:33     Microbiology: No results found for this or any previous visit (from the past 240 hour(s)).   Labs: Basic Metabolic Panel:  Recent Labs Lab 09/18/15 0420 09/21/15 0435 09/22/15 0234 09/23/15 0403 09/24/15 0415  NA 137 136 135 137 138  K 5.2* 4.2 4.3 3.7 3.7  CL 102 100* 100* 103 106  CO2 GLUCOSE 131* 145* 112* 125* 102*  BUN 21* 113* 120* 128* 119*  CREATININE 1.29* 4.39* 4.14* 3.66* 3.14*  CALCIUM  8.0* 8.9 8.6* 8.8* 8.6*  MG 2.4  --   --   --    --   PHOS 1.9*  --  8.1* 7.2*  --    Liver Function Tests:  Recent Labs Lab 09/18/15 0420 09/22/15 0234 09/23/15 0403  AST  --  36 29  ALT  --  41 35  ALKPHOS  --  91 85  BILITOT  --  0.5 0.9  PROT  --  4.9* 4.9*  ALBUMIN 2.2* 2.4* 2.4*   No results for input(s): LIPASE, AMYLASE in the last 168 hours. No results for input(s): AMMONIA in the last 168 hours. CBC:  Recent Labs Lab 09/18/15 0420 09/21/15 0435 09/22/15 0234 09/23/15 0403 09/24/15 0415  WBC 13.1* 13.2* 9.8 7.9 10.2  NEUTROABS 10.4*  --   --   --   --   HGB 8.8* 8.1* 7.8* 7.7* 7.8*  HCT 27.0* 24.6* 23.5* 23.3* 23.1*  MCV 93.4 91.1 90.7 90.3 90.9  PLT 130* 228 236 201 239   Cardiac Enzymes: No results for input(s): CKTOTAL, CKMB, CKMBINDEX, TROPONINI in the last 168 hours. BNP: Invalid input(s): POCBNP CBG:  Recent Labs Lab 09/23/15 1158 09/23/15 1703 09/23/15 2144 09/24/15 0805 09/24/15 1217  GLUCAP 158* 143* 172* 120* 160*    Time coordinating discharge:  Greater than 30 minutes  Signed:  Ainsley Deakins, DO [facility] Pager: [phone] 09/24/2015, 4:46 PM

## 2015-09-23 NOTE — Progress Notes (Signed)
Physical Therapy Treatment Patient Details Name: [name] MRN: [id number] DOB: 04/03/1930 Today's Date: 09/23/2015    History of Present Illness Patient is a 81 y/o male presents with w/ acute resp failure in setting of acute pulmonary edema, hypertensive crisis and NSTEMI, CAP. Intubated 11/13-11/15. Decided on comfort care, however pt with improvement 11/22.  PMH includes stage IV CKD , HTN and CAD.    PT Comments    Patient making good progress with mobility and gait.  Follow Up Recommendations  SNF     Equipment Recommendations  Rolling walker with 5" wheels    Recommendations for Other Services       Precautions / Restrictions Precautions Precautions: Fall Restrictions Weight Bearing Restrictions: No    Mobility  Bed Mobility Overal bed mobility: Needs Assistance Bed Mobility: Supine to Sit;Sit to Supine     Supine to sit: Supervision Sit to supine: Supervision   General bed mobility comments: Assist for safety only.  No physical assist needed.  Transfers Overall transfer level: Needs assistance Equipment used: None;Rolling walker (2 wheeled) Transfers: Sit to/from Stand Sit to Stand: Min assist;Min guard         General transfer comment: With no assistive device, required min assist for balance to rise to standing.  Patient with posterior lean, requiring min assist for balance.  With use of RW, patient required only min guard assist for safety during transfers.  Ambulation/Gait Ambulation/Gait assistance: Min assist Ambulation Distance (Feet): 8 Feet Assistive device: Rolling walker (2 wheeled) Gait Pattern/deviations: Step-through pattern;Decreased stride length     General Gait Details: At edge of bed, patient able to step in place, 10 steps with each foot with RW and min assist.  Then able to step forward 4' and backward 4' with RW and min assist for safety.  After rest, patient repeated.   Stairs            Wheelchair Mobility     Modified Rankin (Stroke Patients Only)       Balance Overall balance assessment: Needs assistance Sitting-balance support: No upper extremity supported;Feet supported Sitting balance-Leahy Scale: Good     Standing balance support: Bilateral upper extremity supported Standing balance-Leahy Scale: Poor Standing balance comment: Without UE support, patient with posterior lean requiring assist for balance.                    Cognition Arousal/Alertness: Awake/alert Behavior During Therapy: WFL for tasks assessed/performed Overall Cognitive Status: Within Functional Limits for tasks assessed                      Exercises      General Comments        Pertinent Vitals/Pain Pain Assessment: No/denies pain    Home Living                      Prior Function            PT Goals (current goals can now be found in the care plan section) Progress towards PT goals: Progressing toward goals    Frequency  Min 2X/week    PT Plan Current plan remains appropriate    Co-evaluation             End of Session Equipment Utilized During Treatment: Gait belt Activity Tolerance: Patient tolerated treatment well;Patient limited by fatigue Patient left: in bed;with call bell/phone within reach;with bed alarm set     Time: [id number] PT Time  Calculation (min) (ACUTE ONLY): 13 min  Charges:  $Therapeutic Activity: 8-22 mins                    G Codes:      Vena Austria 10-17-15, 6:23 PM Durenda Hurt. Renaldo Fiddler, [facility] Acute Rehab Services Pager [phone]

## 2015-09-24 ENCOUNTER — Encounter (HOSPITAL_COMMUNITY): Payer: Medicare Other

## 2015-09-24 LAB — BASIC METABOLIC PANEL
ANION GAP: 8 (ref 5–15)
BUN: 119 mg/dL — AB (ref 6–20)
CHLORIDE: 106 mmol/L (ref 101–111)
CO2: 24 mmol/L (ref 22–32)
Calcium: 8.6 mg/dL — ABNORMAL LOW (ref 8.9–10.3)
Creatinine, Ser: 3.14 mg/dL — ABNORMAL HIGH (ref 0.61–1.24)
GFR calc Af Amer: 20 mL/min — ABNORMAL LOW (ref >60)
GFR, EST NON AFRICAN AMERICAN: 17 mL/min — AB (ref >60)
Glucose, Bld: 102 mg/dL — ABNORMAL HIGH (ref 65–99)
POTASSIUM: 3.7 mmol/L (ref 3.5–5.1)
SODIUM: 138 mmol/L (ref 135–145)

## 2015-09-24 LAB — CBC
HEMATOCRIT: 23.1 % — AB (ref 39.0–52.0)
HEMOGLOBIN: 7.8 g/dL — AB (ref 13.0–17.0)
MCH: 30.7 pg (ref 26.0–34.0)
MCHC: 33.8 g/dL (ref 30.0–36.0)
MCV: 90.9 fL (ref 78.0–100.0)
Platelets: 239 10*3/uL (ref 150–400)
RBC: 2.54 MIL/uL — AB (ref 4.22–5.81)
RDW: 16.5 % — AB (ref 11.5–15.5)
WBC: 10.2 10*3/uL (ref 4.0–10.5)

## 2015-09-24 LAB — GLUCOSE, CAPILLARY
GLUCOSE-CAPILLARY: 120 mg/dL — AB (ref 65–99)
GLUCOSE-CAPILLARY: 164 mg/dL — AB (ref 65–99)
Glucose-Capillary: 160 mg/dL — ABNORMAL HIGH (ref 65–99)

## 2015-09-24 MED ORDER — FUROSEMIDE 80 MG PO TABS: 80.0000 mg | ORAL_TABLET | Freq: Every day | ORAL | Status: DC

## 2015-09-24 MED ORDER — METOPROLOL SUCCINATE ER 50 MG PO TB24: 50.0000 mg | ORAL_TABLET | Freq: Every day | ORAL | Status: AC

## 2015-09-24 MED ORDER — AMLODIPINE BESYLATE 10 MG PO TABS: 10.0000 mg | ORAL_TABLET | Freq: Every day | ORAL | Status: DC

## 2015-09-24 MED ORDER — POLYETHYLENE GLYCOL 3350 17 G PO PACK: 17.0000 g | PACK | Freq: Every day | ORAL | Status: DC | PRN

## 2015-09-24 MED ORDER — HYDRALAZINE HCL 50 MG PO TABS
50.0000 mg | ORAL_TABLET | Freq: Two times a day (BID) | ORAL | Status: DC
  Administered 2015-09-24: 50 mg via ORAL
  Filled 2015-09-24: qty 1

## 2015-09-24 MED ORDER — AMLODIPINE BESYLATE 5 MG PO TABS: 5.0000 mg | ORAL_TABLET | Freq: Every day | ORAL | Status: DC

## 2015-09-24 MED ORDER — FUROSEMIDE 80 MG PO TABS
80.0000 mg | ORAL_TABLET | Freq: Every day | ORAL | Status: DC
  Administered 2015-09-24: 80 mg via ORAL
  Filled 2015-09-24: qty 1

## 2015-09-24 MED ORDER — HYDRALAZINE HCL 50 MG PO TABS
50.0000 mg | ORAL_TABLET | Freq: Two times a day (BID) | ORAL | Status: DC
Start: 2015-09-24 — End: 2015-11-26

## 2015-09-24 NOTE — Progress Notes (Signed)
Subjective: Interval History: has no complaint . To go to NH today.  Objective: Vital signs in last 24 hours: Temp:  [97.3 F (36.3 C)-97.5 F (36.4 C)] 97.5 F (36.4 C) (11/26 0651) Pulse Rate:  [62-71] 64 (11/26 0651) Resp:  [18-22] 18 (11/26 0651) BP: (133-172)/(26-37) 172/37 mmHg (11/26 0651) SpO2:  [93 %-97 %] 94 % (11/26 0727) Weight:  [68.4 kg (150 lb 12.7 oz)] 68.4 kg (150 lb 12.7 oz) (11/26 0651) Weight change: 0.2 kg (7.1 oz)  Intake/Output from previous day: 11/25 0701 - 11/26 0700 In: 520 [P.O.:520] Out: 1400 [Urine:1400] Intake/Output this shift: Total I/O In: -  Out: 150 [Urine:150]  General appearance: alert, cooperative and no distress Resp: diminished breath sounds bilaterally and rales bibasilar Cardio: S1, S2 normal and systolic murmur: systolic ejection 2/6, crescendo and decrescendo at 2nd left intercostal space GI: pos bs, liver down 5 cm Extremities: bilat fem bruits, abdm bruit  Lab Results:  Recent Labs  09/23/15 0403 09/24/15 0415  WBC 7.9 10.2  HGB 7.7* 7.8*  HCT 23.3* 23.1*  PLT 201 239   BMET:  Recent Labs  09/23/15 0403 09/24/15 0415  NA 137 138  K 3.7 3.7  CL 103 106  CO2 22 24  GLUCOSE 125* 102*  BUN 128* 119*  CREATININE 3.66* 3.14*  CALCIUM 8.8* 8.6*    Recent Labs  09/21/15 1612  PTH 190*   Iron Studies:  Recent Labs  09/21/15 1612  IRON 45  TIBC 259    Studies/Results: No results found.  I have reviewed the patient's current medications.  Assessment/Plan: 1 CKD 4-5 improving . Vol stable but with rising bp , restart diuretics, ^ amlod 2 HTN ^ Amlod, lasix 3 PVD 4 Anemia on esa 5 hPTH vit D 6 Debill 7 CAD 8 CAP resolved P lasix, amlod^, will s/o as going to NH, will see outpatient    LOS: 13 days   [name] 09/24/2015,10:34 AM

## 2015-09-24 NOTE — Progress Notes (Addendum)
09/24/15 [facility],discharge instructions to be called to Skilled facility.[facility],report called to nurse.

## 2015-09-24 NOTE — Progress Notes (Signed)
PT Cancellation Note  Patient Details Name: [name] MRN: [id number] DOB: 03/30/1930   Cancelled Treatment:    Reason Eval/Treat Not Completed: Fatigue/lethargy limiting ability to participate.  Patient reports he is being discharged to SNF today.  Is tired and does not feel up to PT today.  Patient to have f/u PT at SNF.   [name], [name] 09/24/2015, 10:26 AM Durenda HurtSusan H. Renaldo Fiddleravis, PT, [facility] Pager [phone]

## 2015-09-27 ENCOUNTER — Non-Acute Institutional Stay (SKILLED_NURSING_FACILITY): Payer: Medicare Other | Admitting: Internal Medicine

## 2015-09-27 DIAGNOSIS — J189 Pneumonia, unspecified organism: Secondary | ICD-10-CM

## 2015-09-27 DIAGNOSIS — E46 Unspecified protein-calorie malnutrition: Secondary | ICD-10-CM | POA: Diagnosis not present

## 2015-09-27 DIAGNOSIS — I214 Non-ST elevation (NSTEMI) myocardial infarction: Secondary | ICD-10-CM | POA: Diagnosis not present

## 2015-09-27 DIAGNOSIS — I10 Essential (primary) hypertension: Secondary | ICD-10-CM

## 2015-09-27 DIAGNOSIS — I5032 Chronic diastolic (congestive) heart failure: Secondary | ICD-10-CM | POA: Diagnosis not present

## 2015-09-27 DIAGNOSIS — D638 Anemia in other chronic diseases classified elsewhere: Secondary | ICD-10-CM

## 2015-09-27 DIAGNOSIS — E1122 Type 2 diabetes mellitus with diabetic chronic kidney disease: Secondary | ICD-10-CM | POA: Diagnosis not present

## 2015-09-27 DIAGNOSIS — N184 Chronic kidney disease, stage 4 (severe): Secondary | ICD-10-CM

## 2015-09-27 DIAGNOSIS — K219 Gastro-esophageal reflux disease without esophagitis: Secondary | ICD-10-CM

## 2015-09-27 DIAGNOSIS — L89102 Pressure ulcer of unspecified part of back, stage 2: Secondary | ICD-10-CM

## 2015-09-27 DIAGNOSIS — K5900 Constipation, unspecified: Secondary | ICD-10-CM

## 2015-09-27 DIAGNOSIS — R5381 Other malaise: Secondary | ICD-10-CM | POA: Diagnosis not present

## 2015-09-27 DIAGNOSIS — E7800 Pure hypercholesterolemia, unspecified: Secondary | ICD-10-CM

## 2015-09-27 NOTE — Progress Notes (Signed)
Patient ID: [name], male   DOB: 09-04-1930, 81 y.o.   MRN: [id number]     Facility: [facility]    PCP: Lupita Raider, MD  Code Status: full code  Allergies  Allergen Reactions  . Labetalol Swelling    angioedema    Chief Complaint  Patient presents with  . New Admit To SNF     HPI:  81 y.o. patient is here for short term rehabilitation post hospital admission from 09/11/15-09/24/15 with acute respiratory failure with hypoxia from pulmonary edema and pneumonia. He required intubation, iv antibiotics and iv diuresis. He also had NSTEMI and required iv heparin, cardiology was consulted and recommended medical management. He received 2 u prbc transfusion in the hospital. He has PMH of CAD, diastolic CHF, CKD stage 4, HTN, COPD, DM among others. He is seen in his room today. He feels good today after taking a shower. He has been working with therapy team. His breathing is stable. Currently not on any o2 supplement. His energy level has been improving. He weight 141.1 lb this am. Per staff, blood pressure reading has been elevated.   Review of Systems:  Constitutional: Negative for fever, chills, diaphoresis.  HENT: Negative for headache, congestion, nasal discharge, difficulty swallowing.   Eyes: Negative for eye pain, blurred vision, double vision and discharge.  Respiratory: Negative for cough, shortness of breath and wheezing.   Cardiovascular: Negative for chest pain, palpitations, leg swelling.  Gastrointestinal: Negative for heartburn, nausea, vomiting, abdominal pain. Had a bowel movement yesterday Genitourinary: Negative for dysuria, flank pain.  Musculoskeletal: Negative for back pain, falls Skin: Negative for itching, rash.  Neurological: Negative for dizziness, tingling, focal weakness Psychiatric/Behavioral: Negative for depression   Past Medical History  Diagnosis Date  . Diabetes mellitus   . Hypertension   . Hypercholesteremia   .  Gout   . PVD (peripheral vascular disease) (HCC) 7/05    LCE, known 60% RICA 7/12  . CAD (coronary artery disease) 2002    non critical  . Macular degeneration   . Chronic renal disease, stage III   . Claudication (HCC)   . Palpitations     PVCs and PSVT on Holter monitoring  . Bilateral carotid artery disease (HCC)     status post left carotid endarterectomy performed by Dr. Madilyn Fireman 05/12/04  . Anemia    Past Surgical History  Procedure Laterality Date  . 2d echocardiogram  03/31/2008    EF greater than 55%  . Cardiovascular stress test  06/30/2010    Nonischemic. Low risk  . Cerebral angiogram  04/03/2004    High-grade 80% ostial L ICA stenosis. Medical management.  . Abdominal aortogram  09/01/2007    Widely patent renal arteries. Medical management.  . Peripheral vascular angiogram  05/07/2011    No evidence of intracranial occlusions, stenosis, dissections, or aneurysms seen  . Carotid doppler  06/09/2012    R Vertebral-known occluded vessel, R Bulb/Proximal ICA-moderate to severe amount of plaque w/50-69% diameter reduction, L Subclavian-50-69% diameter reduction, L CEA-demonstrated increased velocities w/o evidence of hemodynamically significant stenosis  . Cardiac catheterization  10/17/2001    No significant CAD, normal LV systolic function.  . Carotid endarterectomy Left July 2005  . Lower extremity angiogram Bilateral 11/01/2014    Procedure: LOWER EXTREMITY ANGIOGRAM;  Surgeon: [name], MD;  Location: [facility] CATH LAB;  Service: Cardiovascular;  Laterality: Bilateral;  . Abdominal angiogram  11/01/2014    Procedure: ABDOMINAL ANGIOGRAM;  Surgeon: [name],  MD;  Location: [facility] CATH LAB;  Service: Cardiovascular;;  . Knee surgery    . Hernia repair     Social History:   reports that he has quit smoking. He has never used smokeless tobacco. He reports that he does not drink alcohol. His drug history is not on file.  Family History  Problem Relation Age of Onset  .  Stroke Sister   . Heart disease Brother   . Diabetes Neg Hx     Medications:   Medication List       This list is accurate as of: 09/27/15  8:57 AM.  Always use your most recent med list.               ACCU-CHEK FASTCLIX LANCETS Misc  Use to check blood sugar 2 times per day dx code E11.9     allopurinol 100 MG tablet  Commonly known as:  ZYLOPRIM  TAKE 1 TABLET (100 MG TOTAL) BY MOUTH AT BEDTIME.     amLODipine 10 MG tablet  Commonly known as:  NORVASC  Take 1 tablet (10 mg total) by mouth at bedtime.     aspirin EC 81 MG tablet  Take 81 mg by mouth every evening.     atorvastatin 80 MG tablet  Commonly known as:  LIPITOR  Take 1 tablet (80 mg total) by mouth daily at 6 PM.     calcitRIOL 0.25 MCG capsule  Commonly known as:  ROCALTROL  Take 0.25 mcg by mouth every morning.     cholecalciferol 1000 UNITS tablet  Commonly known as:  VITAMIN D  Take 1,000 Units by mouth 2 (two) times daily.     clopidogrel 75 MG tablet  Commonly known as:  PLAVIX  TAKE 1 TABLET BY MOUTH DAILY     feeding supplement Liqd  Take 1 Container by mouth 3 (three) times daily between meals.     furosemide 80 MG tablet  Commonly known as:  LASIX  Take 1 tablet (80 mg total) by mouth daily.     glucose blood test strip  Commonly known as:  ACCU-CHEK AVIVA PLUS  Use as instructed to check blood sugar 2 times per day dx code E11.9     hydrALAZINE 50 MG tablet  Commonly known as:  APRESOLINE  Take 1 tablet (50 mg total) by mouth 2 (two) times daily.     isosorbide mononitrate 60 MG 24 hr tablet  Commonly known as:  IMDUR  Take 1 tablet (60 mg total) by mouth daily.     metoprolol succinate 50 MG 24 hr tablet  Commonly known as:  TOPROL-XL  Take 1 tablet (50 mg total) by mouth daily. Take with or immediately following a meal.     pantoprazole 40 MG tablet  Commonly known as:  PROTONIX  Take 1 tablet (40 mg total) by mouth daily.     polyethylene glycol packet  Commonly known  as:  MIRALAX / GLYCOLAX  Take 17 g by mouth 2 (two) times daily.     polyethylene glycol packet  Commonly known as:  MIRALAX / GLYCOLAX  Take 17 g by mouth daily as needed for mild constipation.     PRESERVISION AREDS PO  Take 2 tablets by mouth daily.     sitaGLIPtin 25 MG tablet  Commonly known as:  JANUVIA  Take 1 tablet (25 mg total) by mouth daily.         Physical Exam: Filed Vitals:   09/27/15 1610  BP: 152/60  Pulse: 70  Temp: 97.9 F (36.6 C)  Resp: 18   Repeat BP check 171/50  General- elderly male, well built, in no acute distress Head- normocephalic, atraumatic Nose- normal nasal mucosa, no maxillary or frontal sinus tenderness, no nasal discharge Throat- moist mucus membrane Eyes- PERRLA, EOMI, no pallor, no icterus, no discharge, normal conjunctiva, normal sclera Neck- no cervical lymphadenopathy Cardiovascular- normal s1,s2, no murmurs, palpable dorsalis pedis and radial pulses, trace leg edema Respiratory- bilateral clear to auscultation, no wheeze, no rhonchi, no crackles, no use of accessory muscles Abdomen- bowel sounds present, soft, non tender Musculoskeletal- able to move all 4 extremities, generalized weakness, on wheelchair Neurological- no focal deficit, alert and oriented to person, place and time Skin- warm and dry, open skin area to lumbar midline without signs of infection, open skin area to left mid back area without signs of infection, non tender but itching + Psychiatry- normal mood and affect    Labs reviewed: Basic Metabolic Panel:  Recent Labs  16/10/96 0420  09/17/15 0410  09/18/15 0420  09/22/15 0234 09/23/15 0403 09/24/15 0415  NA 138  < > 139  < > 137  < > 135 137 138  K 4.2  < > 4.7  < > 5.2*  < > 4.3 3.7 3.7  CL 102  < > 103  < > 102  < > 100* 103 106  CO2 27  < > 29  < > 29  < > GLUCOSE 147*  < > 131*  < > 131*  < > 112* 125* 102*  BUN 48*  < > 24*  < > 21*  < > 120* 128* 119*  CREATININE 2.09*  < >  1.37*  < > 1.29*  < > 4.14* 3.66* 3.14*  CALCIUM 7.8*  < > 8.1*  < > 8.0*  < > 8.6* 8.8* 8.6*  MG 2.6*  --  2.6*  --  2.4  --   --   --   --   PHOS 4.1  < > 2.2*  < > 1.9*  --  8.1* 7.2*  --   < > = values in this interval not displayed. Liver Function Tests:  Recent Labs  09/11/15 1450  09/18/15 0420 09/22/15 0234 09/23/15 0403  AST 71*  --   --  36 29  ALT 23  --   --  41 35  ALKPHOS 84  --   --  91 85  BILITOT 1.0  --   --  0.5 0.9  PROT 6.4*  --   --  4.9* 4.9*  ALBUMIN 3.0*  < > 2.2* 2.4* 2.4*  < > = values in this interval not displayed.  Recent Labs  03/21/15 1501 03/22/15 0422  LIPASE 27 25   No results for input(s): AMMONIA in the last 8760 hours. CBC:  Recent Labs  09/17/15 0410 09/17/15 1550 09/18/15 0420  09/22/15 0234 09/23/15 0403 09/24/15 0415  WBC 11.0* 8.4 13.1*  < > 9.8 7.9 10.2  NEUTROABS 8.5* 6.6 10.4*  --   --   --   --   HGB 8.6* 8.7* 8.8*  < > 7.8* 7.7* 7.8*  HCT 26.6* 27.5* 27.0*  < > 23.5* 23.3* 23.1*  MCV 92.4 92.9 93.4  < > 90.7 90.3 90.9  PLT 146* 137* 130*  < > 236 201 239  < > = values in this interval not displayed. Cardiac Enzymes:  Recent Labs  09/11/15  2020 09/12/15 0235 09/13/15 1115  TROPONINI 11.37* 18.51* 11.65*   BNP: Invalid input(s): POCBNP CBG:  Recent Labs  09/24/15 0805 09/24/15 1217 09/24/15 1715  GLUCAP 120* 160* 164*    Radiological Exams: Dg Chest Port 1 View  09/12/2015  CLINICAL DATA:  RN states she is giving lasix and checking for fluid overload, pt has ETT, OGT, Rt subclavian line, hx htn, diabetes. Just tested positive for the flu and put on droplet precaution. EXAM: PORTABLE CHEST 1 VIEW COMPARISON:  09/11/2015 FINDINGS: Bilateral airspace opacities are less confluent than on the previous day's study. There are no new lung opacities. Cardiac silhouette is top-normal in size. No mediastinal or hilar masses or evidence of adenopathy. No pneumothorax. Endotracheal tube tip projects 3.1 cm above the  carina. Orogastric tube passes below the diaphragm into the stomach. This is new from the earlier study. Right subclavian central venous line tip projects just above the caval atrial junction, well positioned. IMPRESSION: 1. Improved lung aeration although there is still significant bilateral airspace opacities, consistent with pulmonary edema. 2. No new abnormalities. 3. Support apparatus is well positioned. Electronically Signed   By: Amie Portland M.D.   On: 09/12/2015 10:21   Portable Chest Xray  09/11/2015  CLINICAL DATA:  Status post intubation and central line placement today. EXAM: PORTABLE CHEST 1 VIEW COMPARISON:  Single view of the chest earlier today. FINDINGS: New right subclavian catheter is seen with the tip projecting over the lower superior vena cava. No pneumothorax is identified. Endotracheal tube is also seen with the tip in good position 4 cm above the carina. Right worse than left airspace disease has increased since the study earlier today. No pneumothorax is identified. Defibrillator pads are in place. IMPRESSION: ET tube and right subclavian catheter project in good position. Negative for pneumothorax. Worsened right greater than left airspace disease could be due to asymmetric edema or pneumonia. Electronically Signed   By: Drusilla Kanner M.D.   On: 09/11/2015 16:39   Dg Chest Port 1 View  09/11/2015  CLINICAL DATA:  Shortness of breath, sepsis EXAM: PORTABLE CHEST 1 VIEW COMPARISON:  09/11/2015 FINDINGS: Defibrillator pads and monitor leads overlie the chest. Asymmetric mid and lower lung airspace process, worse on the right compatible with asymmetric edema or pneumonia. Heart remains enlarged. No developing significant effusion or pneumothorax. Trachea midline. Aortic atherosclerotic. No significant interval change. IMPRESSION: Persistent mid and lower lung asymmetric airspace process worse on the right compatible with pneumonia or asymmetric edema. Electronically Signed   By:  Judie Petit.  Shick M.D.   On: 09/11/2015 12:38   Dg Chest Port 1 View  09/11/2015  CLINICAL DATA:  Pt arrives from home via [facility]. Pt states he began having SOB last night and wasn't able to lay down and still breathe. Pt is extremely labored upon arrival and is using accessory muscles. Pt has bilateral coarse crackles. Pt states he is in "borderline kidney failure" and quit smoking 30 years ago EXAM: PORTABLE CHEST 1 VIEW COMPARISON:  03/21/2015 FINDINGS: Bilateral airspace opacity has developed in the mid to lower lungs, right greater than left. The upper lungs are clear. Cardiac silhouette is normal in size. No mediastinal or hilar masses. No definite pleural effusion and no pneumothorax. Bony thorax is demineralized but grossly intact. IMPRESSION: Bilateral mid to lower lung zone airspace opacities, right greater than left. This may reflect multifocal pneumonia. Pulmonary edema is possible, with pneumonia favored. Electronically Signed   By: Amie Portland M.D.   On:  09/11/2015 10:07   Dg Abd Portable 1v  09/11/2015  CLINICAL DATA:  Check OG tube placement EXAM: PORTABLE ABDOMEN - 1 VIEW COMPARISON:  03/27/2015 FINDINGS: Scattered large and small bowel gas is noted. A gastric catheter is noted within the stomach. No bony abnormality is seen. Left iliac arterial stenting is noted. IMPRESSION: Gastric catheter in satisfactory position. Electronically Signed   By: Alcide CleverMark  Lukens M.D.   On: 09/11/2015 17:33     Assessment/Plan  Physical deconditioning Will have him work with physical therapy and occupational therapy team to help with gait training and muscle strengthening exercises.fall precautions. Skin care. Encourage to be out of bed.   CAP Afebrile, breathing stable on room air, has completed his antibiotic course, monitor clinically  chf Recent chf exacerbation. Monitor daily weight. Weight in facility 141.1 lb today per patient. No signs of fluid overload on exam. Continue lasix 80 mg daily, imdur 60  mg daily and toprol xl 50 mg daily. Increase hydralazine to 50 mg tid for now. check bmp Wt Readings from Last 3 Encounters:  09/24/15 150 lb 12.7 oz (68.4 kg)  07/20/15 162 lb 3.2 oz (73.573 kg)  06/02/15 164 lb (74.39 kg)   CAD Remains chest pain free. Recent NSTEMI. Continue aspirin 81 mg daily, plavix 75 mg daily, lipitor 80 mg daily, toprol xl 50 mg daily.   Uncontrolled HTN Elevated BP reading on review. Currently on norvasc 10 mg daily, toprol xl 50 mg daily and hydralazine 50 mg bid with imdur 60 mg daily. Increase hydralazine to 50 mg tid. Check bp q shift x 1 week, adjust further if needed. Goal BP < 150/90  Anemia S/p 2 u prbc transfusion in hospital. Monitor cbc  Skin breakdown/ pressure ulcer To his back area with denuded skin area- stage 2. Continue wound care, pressure ulcer prophylaxis. Add triamcinolone 0.025% ointment to the affected area bid x 1 week and monitor  HLD Continue lipitor 80 mg daily for now  ckd stage 4 Required CRRT 09/14/15 with septic shock. Not on dialysis at present. Continue calcitriol and vitamin d supplement. Monitor renal function. Continue lasix 80 mg daily for now. Not on any kcl supplement. Check bmp  Protein calorie malnutrition Continue feeding supplement. Monitor daily weight and to be followed by dietary team  gerd Continue protonix 40 mg daily and monitor  Constipation Continue miralax daily as needed  Diabetes Lab Results  Component Value Date   HGBA1C 6.1* 09/22/2015  continue sitagliptin 25 mg daily and check cbc daily. Continue aspirin and statin.    Goals of care: short term rehabilitation   Labs/tests ordered: cbc, cmp, daily weight and BP check  Family/ staff Communication: reviewed care plan with patient and nursing supervisor    Oneal GroutMAHIMA Remedy Corporan, MD  [facility] [phone] (Monday-Friday 8 am - 5 pm) [phone] (afterhours)

## 2015-09-28 LAB — CBC AND DIFFERENTIAL
HCT: 24 % — AB (ref 41–53)
HEMATOCRIT: 24 % — AB (ref 41–53)
HEMOGLOBIN: 7.6 g/dL — AB (ref 13.5–17.5)
Hemoglobin: 7.6 g/dL — AB (ref 13.5–17.5)
Platelets: 299 10*3/uL (ref 150–399)
Platelets: 299 10*3/uL (ref 150–399)
WBC: 7.4 10*3/mL
WBC: 7.4 10^3/mL

## 2015-09-28 LAB — BASIC METABOLIC PANEL
BUN: 102 mg/dL — AB (ref 4–21)
Creatinine: 2.8 mg/dL — AB (ref 0.6–1.3)
POTASSIUM: 4.1 mmol/L (ref 3.4–5.3)
SODIUM: 142 mmol/L (ref 137–147)

## 2015-09-30 ENCOUNTER — Other Ambulatory Visit: Payer: Self-pay

## 2015-09-30 ENCOUNTER — Encounter: Payer: Self-pay | Admitting: Vascular Surgery

## 2015-09-30 DIAGNOSIS — Z01810 Encounter for preprocedural cardiovascular examination: Secondary | ICD-10-CM

## 2015-09-30 DIAGNOSIS — N185 Chronic kidney disease, stage 5: Secondary | ICD-10-CM

## 2015-10-03 ENCOUNTER — Non-Acute Institutional Stay (SKILLED_NURSING_FACILITY): Payer: Medicare Other | Admitting: Nurse Practitioner

## 2015-10-03 ENCOUNTER — Encounter (HOSPITAL_COMMUNITY)
Admission: RE | Admit: 2015-10-03 | Discharge: 2015-10-03 | Disposition: A | Discharge status: Home / Self Care | Payer: Medicare Other | Source: Ambulatory Visit | Attending: Nephrology | Admitting: Nephrology

## 2015-10-03 DIAGNOSIS — N183 Chronic kidney disease, stage 3 (moderate): Secondary | ICD-10-CM | POA: Insufficient documentation

## 2015-10-03 DIAGNOSIS — D631 Anemia in chronic kidney disease: Secondary | ICD-10-CM | POA: Insufficient documentation

## 2015-10-03 DIAGNOSIS — E1122 Type 2 diabetes mellitus with diabetic chronic kidney disease: Secondary | ICD-10-CM | POA: Diagnosis not present

## 2015-10-03 DIAGNOSIS — D638 Anemia in other chronic diseases classified elsewhere: Secondary | ICD-10-CM

## 2015-10-03 DIAGNOSIS — J189 Pneumonia, unspecified organism: Secondary | ICD-10-CM | POA: Diagnosis not present

## 2015-10-03 DIAGNOSIS — I25810 Atherosclerosis of coronary artery bypass graft(s) without angina pectoris: Secondary | ICD-10-CM

## 2015-10-03 DIAGNOSIS — N184 Chronic kidney disease, stage 4 (severe): Secondary | ICD-10-CM | POA: Diagnosis not present

## 2015-10-03 DIAGNOSIS — I5032 Chronic diastolic (congestive) heart failure: Secondary | ICD-10-CM | POA: Diagnosis not present

## 2015-10-03 DIAGNOSIS — E7800 Pure hypercholesterolemia, unspecified: Secondary | ICD-10-CM | POA: Diagnosis not present

## 2015-10-03 DIAGNOSIS — I10 Essential (primary) hypertension: Secondary | ICD-10-CM

## 2015-10-03 LAB — FERRITIN: FERRITIN: 157 ng/mL (ref 24–336)

## 2015-10-03 LAB — POCT HEMOGLOBIN-HEMACUE: HEMOGLOBIN: 8.5 g/dL — AB (ref 13.0–17.0)

## 2015-10-03 LAB — IRON AND TIBC
Iron: 59 ug/dL (ref 45–182)
Saturation Ratios: 23 % (ref 17.9–39.5)
TIBC: 259 ug/dL (ref 250–450)
UIBC: 200 ug/dL

## 2015-10-03 MED ORDER — EPOETIN ALFA 40000 UNIT/ML IJ SOLN
INTRAMUSCULAR | Status: AC
  Administered 2015-10-03: 40000 [IU] via SUBCUTANEOUS
  Filled 2015-10-03: qty 1

## 2015-10-03 MED ORDER — EPOETIN ALFA 40000 UNIT/ML IJ SOLN
40000.0000 [IU] | INTRAMUSCULAR | Status: DC
  Administered 2015-10-03: 40000 [IU] via SUBCUTANEOUS

## 2015-10-03 NOTE — Progress Notes (Signed)
Patient ID: [name], male   DOB: 11/19/1929, 81 y.o.   MRN: [id number]    Nursing Home Location:  [facility]   Place of Service: SNF (31)  PCP: [name],[name], MD  Allergies  Allergen Reactions  . Labetalol Swelling    angioedema    Chief Complaint  Patient presents with  . Discharge Note    HPI:  Patient is a 81 y.o. male seen today at [facility] for discharge home. He has PMH of CAD, diastolic CHF, CKD stage 4, HTN, COPD, DM. Pt here for short term rehabilitation post hospital admission from 09/11/15-09/24/15 with acute respiratory failure with hypoxia from pulmonary edema and pneumonia. He also had NSTEMI per cardiology recommended medical management. He received 2 u prbc transfusion in the hospital. He has been working with therapy team and doing well. Patient currently doing well with therapy, now stable to discharge home with home health.   Review of Systems:  Review of Systems  Constitutional: Negative for activity change, appetite change, fatigue and unexpected weight change.  HENT: Negative for congestion and hearing loss.   Eyes: Negative.   Respiratory: Negative for cough and shortness of breath.   Cardiovascular: Negative for chest pain, palpitations and leg swelling.  Gastrointestinal: Negative for abdominal pain, diarrhea and constipation.  Genitourinary: Negative for dysuria and difficulty urinating.  Musculoskeletal: Negative for myalgias and arthralgias.  Skin: Negative for color change and wound.  Neurological: Negative for dizziness and weakness.  Psychiatric/Behavioral: Negative for behavioral problems, confusion and agitation.    Past Medical History  Diagnosis Date  . Diabetes mellitus   . Hypertension   . Hypercholesteremia   . Gout   . PVD (peripheral vascular disease) (HCC) 7/05    LCE, known 60% RICA 7/12  . CAD (coronary artery disease) 2002    non critical  . Macular degeneration   . Chronic renal  disease, stage III   . Claudication (HCC)   . Palpitations     PVCs and PSVT on Holter monitoring  . Bilateral carotid artery disease (HCC)     status post left carotid endarterectomy performed by Dr. Madilyn FiremanHayes 05/12/04  . Anemia    Past Surgical History  Procedure Laterality Date  . 2d echocardiogram  03/31/2008    EF greater than 55%  . Cardiovascular stress test  06/30/2010    Nonischemic. Low risk  . Cerebral angiogram  04/03/2004    High-grade 80% ostial L ICA stenosis. Medical management.  . Abdominal aortogram  09/01/2007    Widely patent renal arteries. Medical management.  . Peripheral vascular angiogram  05/07/2011    No evidence of intracranial occlusions, stenosis, dissections, or aneurysms seen  . Carotid doppler  06/09/2012    R Vertebral-known occluded vessel, R Bulb/Proximal ICA-moderate to severe amount of plaque w/50-69% diameter reduction, L Subclavian-50-69% diameter reduction, L CEA-demonstrated increased velocities w/o evidence of hemodynamically significant stenosis  . Cardiac catheterization  10/17/2001    No significant CAD, normal LV systolic function.  . Carotid endarterectomy Left July 2005  . Lower extremity angiogram Bilateral 11/01/2014    Procedure: LOWER EXTREMITY ANGIOGRAM;  Surgeon: Runell GessJonathan J Berry, MD;  [facility];  Service: Cardiovascular;  Laterality: Bilateral;  . Abdominal angiogram  11/01/2014    Procedure: ABDOMINAL ANGIOGRAM;  Surgeon: Runell GessJonathan J Berry, MD;  [facility];  Service: Cardiovascular;;  . Knee surgery    . Hernia repair     Social History:  reports that he has quit smoking. He has never used smokeless tobacco. He reports that he does not drink alcohol. His drug history is not on file.  Family History  Problem Relation Age of Onset  . Stroke Sister   . Heart disease Brother   . Diabetes Neg Hx     Medications: Patient's Medications  New Prescriptions   No medications on file  Previous Medications   ACCU-CHEK  FASTCLIX LANCETS MISC    Use to check blood sugar 2 times per day dx code E11.9   ALLOPURINOL (ZYLOPRIM) 100 MG TABLET    TAKE 1 TABLET (100 MG TOTAL) BY MOUTH AT BEDTIME.   AMLODIPINE (NORVASC) 10 MG TABLET    Take 1 tablet (10 mg total) by mouth at bedtime.   ASPIRIN EC 81 MG TABLET    Take 81 mg by mouth every evening.   ATORVASTATIN (LIPITOR) 80 MG TABLET    Take 1 tablet (80 mg total) by mouth daily at 6 PM.   CALCITRIOL (ROCALTROL) 0.25 MCG CAPSULE    Take 0.25 mcg by mouth every morning.    CHOLECALCIFEROL (VITAMIN D) 1000 UNITS TABLET    Take 1,000 Units by mouth 2 (two) times daily.    CLOPIDOGREL (PLAVIX) 75 MG TABLET    TAKE 1 TABLET BY MOUTH DAILY   FEEDING SUPPLEMENT, RESOURCE BREEZE, (RESOURCE BREEZE) LIQD    Take 1 Container by mouth 3 (three) times daily between meals.   FERROUS SULFATE 325 (65 FE) MG TABLET    Take 325 mg by mouth 2 (two) times daily with a meal.   FUROSEMIDE (LASIX) 80 MG TABLET    Take 1 tablet (80 mg total) by mouth daily.   GLUCOSE BLOOD (ACCU-CHEK AVIVA PLUS) TEST STRIP    Use as instructed to check blood sugar 2 times per day dx code E11.9   HYDRALAZINE (APRESOLINE) 50 MG TABLET    Take 1 tablet (50 mg total) by mouth 2 (two) times daily.   ISOSORBIDE MONONITRATE (IMDUR) 60 MG 24 HR TABLET    Take 1 tablet (60 mg total) by mouth daily.   METOPROLOL SUCCINATE (TOPROL-XL) 50 MG 24 HR TABLET    Take 1 tablet (50 mg total) by mouth daily. Take with or immediately following a meal.   MULTIPLE VITAMINS-MINERALS (PRESERVISION AREDS PO)    Take 2 tablets by mouth daily.   PANTOPRAZOLE (PROTONIX) 40 MG TABLET    Take 1 tablet (40 mg total) by mouth daily.   POLYETHYLENE GLYCOL (MIRALAX / GLYCOLAX) PACKET    Take 17 g by mouth 2 (two) times daily.   POLYETHYLENE GLYCOL (MIRALAX / GLYCOLAX) PACKET    Take 17 g by mouth daily as needed for mild constipation.   SITAGLIPTIN (JANUVIA) 25 MG TABLET    Take 1 tablet (25 mg total) by mouth daily.  Modified Medications   No  medications on file  Discontinued Medications   No medications on file     Physical Exam: Filed Vitals:   10/03/15 1720  BP: 161/42  Pulse: 70  Temp: 96.7 F (35.9 C)  Resp: 20    Physical Exam  Constitutional: He is oriented to person, place, and time. He appears well-developed and well-nourished. No distress.  HENT:  Head: Normocephalic and atraumatic.  Mouth/Throat: Oropharynx is clear and moist. No oropharyngeal exudate.  Eyes: Conjunctivae and EOM are normal. Pupils are equal, round, and reactive to light.  Neck: Normal range of motion. Neck supple.  Cardiovascular: Normal rate,  regular rhythm and normal heart sounds.   Pulmonary/Chest: Effort normal and breath sounds normal.  Abdominal: Soft. Bowel sounds are normal.  Musculoskeletal: He exhibits no edema or tenderness.  Neurological: He is alert and oriented to person, place, and time.  Skin: Skin is warm and dry. He is not diaphoretic.  Psychiatric: He has a normal mood and affect.    Labs reviewed: Basic Metabolic Panel:  Recent Labs  09/81/19 0420  09/17/15 0410  09/18/15 0420  09/22/15 0234 09/23/15 0403 09/24/15 0415 09/28/15  NA 138  < > 139  < > 137  < > 135 137 138 142  K 4.2  < > 4.7  < > 5.2*  < > 4.3 3.7 3.7 4.1  CL 102  < > 103  < > 102  < > 100* 103 106  --   CO2 27  < > 29  < > 29  < > --   GLUCOSE 147*  < > 131*  < > 131*  < > 112* 125* 102*  --   BUN 48*  < > 24*  < > 21*  < > 120* 128* 119* 102*  CREATININE 2.09*  < > 1.37*  < > 1.29*  < > 4.14* 3.66* 3.14* 2.8*  CALCIUM 7.8*  < > 8.1*  < > 8.0*  < > 8.6* 8.8* 8.6*  --   MG 2.6*  --  2.6*  --  2.4  --   --   --   --   --   PHOS 4.1  < > 2.2*  < > 1.9*  --  8.1* 7.2*  --   --   < > = values in this interval not displayed. Liver Function Tests:  Recent Labs  09/11/15 1450  09/18/15 0420 09/22/15 0234 09/23/15 0403  AST 71*  --   --  36 29  ALT 23  --   --  41 35  ALKPHOS 84  --   --  91 85  BILITOT 1.0  --   --  0.5 0.9    PROT 6.4*  --   --  4.9* 4.9*  ALBUMIN 3.0*  < > 2.2* 2.4* 2.4*  < > = values in this interval not displayed.  Recent Labs  03/21/15 1501 03/22/15 0422  LIPASE 27 25   No results for input(s): AMMONIA in the last 8760 hours. CBC:  Recent Labs  09/17/15 0410 09/17/15 1550 09/18/15 0420  09/22/15 0234 09/23/15 0403 09/24/15 0415 09/28/15 10/03/15 0946  WBC 11.0* 8.4 13.1*  < > 9.8 7.9 10.2 7.4  7.4  --   NEUTROABS 8.5* 6.6 10.4*  --   --   --   --   --   --   HGB 8.6* 8.7* 8.8*  < > 7.8* 7.7* 7.8* 7.6*  7.6* 8.5*  HCT 26.6* 27.5* 27.0*  < > 23.5* 23.3* 23.1* 24*  24*  --   MCV 92.4 92.9 93.4  < > 90.7 90.3 90.9  --   --   PLT 146* 137* 130*  < > 236 201 239 299  299  --   < > = values in this interval not displayed. TSH: No results for input(s): TSH in the last 8760 hours. A1C: Lab Results  Component Value Date   HGBA1C 6.1* 09/22/2015   Lipid Panel:  Recent Labs  09/23/15 0403  CHOL 110  HDL 41  LDLCALC 48  TRIG 105  CHOLHDL  2.7      Assessment/Plan 1. CAP (community acquired pneumonia) Resolved, breathing remains stable, cont antibiotic   2. Chronic diastolic congestive heart failure (HCC) Stable, euvolemic at this time, pt with recent hosptial admission due to exacerbation. Continue lasix 80 mg daily, imdur 60 mg daily and toprol xl 50 mg daily.  3. CKD (chronic kidney disease), stage 4 (severe) (HCC) Required CRRT during hospitalization with septic shock. Not on dialysis at present. BUN/CR stable, Continue calcitriol and vitamin d supplement.  4. Anemia of chronic disease S/p infusion, hgb stable on recent CBC, will need ongoing follow up by PCP  5. Hypertension, uncontrolled Stable yet not at goal, cont on norvasc 10 mg daily, toprol xl 50 mg daily and  hydralazine 50 mg recently increased to  TID with imdur 60 mg daily. Will need ongoing follow up by PCP  6. Coronary artery disease involving coronary bypass graft of native heart without  angina pectoris Stable, without chest pain. Recent NSTEMI during last hospitalization. Continue aspirin 81 mg daily, plavix 75 mg daily, lipitor 80 mg daily, toprol xl 50 mg daily  7. Hypercholesteremia LDL of 48 on recent labs, conts on lipitor 80 mg daily   8. Type 2 diabetes mellitus with stage 4 chronic kidney disease, without long-term current use of insulin (HCC) Recent A1c at goal. conts sitagliptin   pt is stable for discharge-will need PT/OT per home health. No DME needed. Rx written.  will need to follow up with PCP within 1-2 weeks.     Janene Harvey. Biagio Borg  [facility] [phone] 8 am - 5 pm) [phone] (after hours)

## 2015-10-05 ENCOUNTER — Ambulatory Visit (INDEPENDENT_AMBULATORY_CARE_PROVIDER_SITE_OTHER)
Admission: RE | Admit: 2015-10-05 | Discharge: 2015-10-05 | Disposition: A | Discharge status: Home / Self Care | Payer: Medicare Other | Source: Ambulatory Visit | Attending: Vascular Surgery | Admitting: Vascular Surgery

## 2015-10-05 ENCOUNTER — Encounter: Payer: Self-pay | Admitting: Vascular Surgery

## 2015-10-05 ENCOUNTER — Other Ambulatory Visit: Payer: Self-pay

## 2015-10-05 ENCOUNTER — Ambulatory Visit (INDEPENDENT_AMBULATORY_CARE_PROVIDER_SITE_OTHER): Payer: Medicare Other | Admitting: Vascular Surgery

## 2015-10-05 ENCOUNTER — Ambulatory Visit (HOSPITAL_COMMUNITY)
Admission: RE | Admit: 2015-10-05 | Discharge: 2015-10-05 | Disposition: A | Discharge status: Home / Self Care | Payer: Medicare Other | Source: Ambulatory Visit | Attending: Vascular Surgery | Admitting: Vascular Surgery

## 2015-10-05 VITALS — BP 153/56 | HR 56 | Temp 97.0°F | Resp 18 | Ht 66.0 in | Wt 152.0 lb

## 2015-10-05 DIAGNOSIS — E785 Hyperlipidemia, unspecified: Secondary | ICD-10-CM | POA: Diagnosis not present

## 2015-10-05 DIAGNOSIS — Z01810 Encounter for preprocedural cardiovascular examination: Secondary | ICD-10-CM | POA: Insufficient documentation

## 2015-10-05 DIAGNOSIS — I120 Hypertensive chronic kidney disease with stage 5 chronic kidney disease or end stage renal disease: Secondary | ICD-10-CM | POA: Diagnosis not present

## 2015-10-05 DIAGNOSIS — N184 Chronic kidney disease, stage 4 (severe): Secondary | ICD-10-CM | POA: Diagnosis not present

## 2015-10-05 DIAGNOSIS — N185 Chronic kidney disease, stage 5: Secondary | ICD-10-CM | POA: Insufficient documentation

## 2015-10-05 DIAGNOSIS — E119 Type 2 diabetes mellitus without complications: Secondary | ICD-10-CM | POA: Diagnosis not present

## 2015-10-05 NOTE — Progress Notes (Signed)
[facility] [place] HISTORY AND PHYSICAL   History of Present Illness:  Patient is a 81 y.o. male who presents for placement of a permanent hemodialysis access.  He is referred by Dr. Darrick Pennaeterding.The patient is right handed.  The patient is not currently on hemodialysis.  Other chronic medical problems include  Diabetes, hypertension, hypercholesterolemia , coronary artery disease all of which are currently stable..  Past Medical History  Diagnosis Date  . Diabetes mellitus   . Hypertension   . Hypercholesteremia   . Gout   . PVD (peripheral vascular disease) (HCC) 7/05    LCE, known 60% RICA 7/12  . CAD (coronary artery disease) 2002    non critical  . Macular degeneration   . Chronic renal disease, stage III   . Claudication (HCC)   . Palpitations     PVCs and PSVT on Holter monitoring  . Bilateral carotid artery disease (HCC)     status post left carotid endarterectomy performed by Dr. Madilyn FiremanHayes 05/12/04  . Anemia     Past Surgical History  Procedure Laterality Date  . 2d echocardiogram  03/31/2008    EF greater than 55%  . Cardiovascular stress test  06/30/2010    Nonischemic. Low risk  . Cerebral angiogram  04/03/2004    High-grade 80% ostial L ICA stenosis. Medical management.  . Abdominal aortogram  09/01/2007    Widely patent renal arteries. Medical management.  . Peripheral vascular angiogram  05/07/2011    No evidence of intracranial occlusions, stenosis, dissections, or aneurysms seen  . Carotid doppler  06/09/2012    R Vertebral-known occluded vessel, R Bulb/Proximal ICA-moderate to severe amount of plaque w/50-69% diameter reduction, L Subclavian-50-69% diameter reduction, L CEA-demonstrated increased velocities w/o evidence of hemodynamically significant stenosis  . Cardiac catheterization  10/17/2001    No significant CAD, normal LV systolic function.  . Carotid endarterectomy Left July 2005  . Lower extremity angiogram Bilateral 11/01/2014     Procedure: LOWER EXTREMITY ANGIOGRAM;  Surgeon: Runell GessJonathan J Berry, MD;  [facility];  Service: Cardiovascular;  Laterality: Bilateral;  . Abdominal angiogram  11/01/2014    Procedure: ABDOMINAL ANGIOGRAM;  Surgeon: Runell GessJonathan J Berry, MD;  [facility];  Service: Cardiovascular;;  . Knee surgery    . Hernia repair       Social History Social History  Substance Use Topics  . Smoking status: Former Smoker -- 1.00 packs/day for 40 years  . Smokeless tobacco: Never Used     Comment: quit approx. 20 years ago.  . Alcohol Use: No    Family History Family History  Problem Relation Age of Onset  . Stroke Sister   . Heart disease Brother   . Diabetes Neg Hx     Allergies  Allergies  Allergen Reactions  . Labetalol Swelling    angioedema     Current Outpatient Prescriptions  Medication Sig Dispense Refill  . ACCU-CHEK FASTCLIX LANCETS MISC Use to check blood sugar 2 times per day dx code E11.9 102 each 2  . allopurinol (ZYLOPRIM) 100 MG tablet TAKE 1 TABLET (100 MG TOTAL) BY MOUTH AT BEDTIME. 30 tablet 2  . amLODipine (NORVASC) 10 MG tablet Take 1 tablet (10 mg total) by mouth at bedtime. 10 tablet 1  . aspirin EC 81 MG tablet Take 81 mg by mouth every evening.    Marland Kitchen. atorvastatin (LIPITOR) 80 MG tablet Take 1 tablet (80 mg total) by mouth daily at 6 PM. 30 tablet 1  .  calcitRIOL (ROCALTROL) 0.25 MCG capsule Take 0.25 mcg by mouth every morning.     . cholecalciferol (VITAMIN D) 1000 UNITS tablet Take 1,000 Units by mouth 2 (two) times daily.     . clopidogrel (PLAVIX) 75 MG tablet TAKE 1 TABLET BY MOUTH DAILY 30 tablet 10  . ferrous sulfate 325 (65 FE) MG tablet Take 325 mg by mouth 2 (two) times daily with a meal.    . furosemide (LASIX) 80 MG tablet Take 1 tablet (80 mg total) by mouth daily. 30 tablet 1  . glucose blood (ACCU-CHEK AVIVA PLUS) test strip Use as instructed to check blood sugar 2 times per day dx code E11.9 100 each 3  . hydrALAZINE (APRESOLINE) 50  MG tablet Take 1 tablet (50 mg total) by mouth 2 (two) times daily. (Patient taking differently: Take 50 mg by mouth 3 (three) times daily. ) 60 tablet 1  . isosorbide mononitrate (IMDUR) 60 MG 24 hr tablet Take 1 tablet (60 mg total) by mouth daily. 30 tablet 1  . metoprolol succinate (TOPROL-XL) 50 MG 24 hr tablet Take 1 tablet (50 mg total) by mouth daily. Take with or immediately following a meal. 30 tablet 1  . Multiple Vitamins-Minerals (PRESERVISION AREDS PO) Take 2 tablets by mouth daily.    . pantoprazole (PROTONIX) 40 MG tablet Take 1 tablet (40 mg total) by mouth daily. 30 tablet 0  . polyethylene glycol (MIRALAX / GLYCOLAX) packet Take 17 g by mouth 2 (two) times daily. 14 each 0  . polyethylene glycol (MIRALAX / GLYCOLAX) packet Take 17 g by mouth daily as needed for mild constipation. 14 each 0  . sitaGLIPtin (JANUVIA) 25 MG tablet Take 1 tablet (25 mg total) by mouth daily. 30 tablet 2  . feeding supplement, RESOURCE BREEZE, (RESOURCE BREEZE) LIQD Take 1 Container by mouth 3 (three) times daily between meals. (Patient not taking: Reported on 10/05/2015) 30 Container 0   No current facility-administered medications for this visit.    ROS:   General:  No weight loss, Fever, chills  HEENT: No recent headaches, no nasal bleeding, no visual changes, no sore throat  Neurologic: No dizziness, blackouts, seizures. No recent symptoms of stroke or mini- stroke. No recent episodes of slurred speech, or temporary blindness.  Cardiac: No recent episodes of chest pain/pressure, no shortness of breath at rest.  + shortness of breath with exertion.  Denies history of atrial fibrillation or irregular heartbeat  Vascular: No history of rest pain in feet.  No history of claudication.  No history of non-healing ulcer, No history of DVT   Pulmonary: No home oxygen, no productive cough, no hemoptysis,  No asthma or wheezing  Musculoskeletal:   Arthritis,  Low back pain,   Joint  pain  Hematologic:No history of hypercoagulable state.  No history of easy bleeding.  No history of anemia  Gastrointestinal: No hematochezia or melena,  No gastroesophageal reflux, no trouble swallowing  Urinary:  chronic Kidney disease,  on HD -  MWF or  TTHS,  Burning with urination,  Frequent urination,  Difficulty urinating;   Skin: No rashes  Psychological: No history of anxiety,  No history of depression   Physical Examination  Filed Vitals:   10/05/15 1520 10/05/15 1521  BP: 171/61 153/56  Pulse: 56 56  Temp: 97 F (36.1 C)   Resp: 18   Height:  (1.676 m)   Weight: 152 lb (68.947 kg)  SpO2: 99%     Body mass index is 24.55 kg/(m^2).  General:  Alert and oriented, no acute distress HEENT: Normal Neck: No bruit or JVD Pulmonary: Clear to auscultation bilaterally Cardiac: Regular Rate and Rhythm without murmur Gastrointestinal: Soft, non-tender, non-distended, no mass Skin: No rash Extremity Pulses:  2+ radial, brachial pulses bilaterally Musculoskeletal: No deformity or edema  Neurologic: Upper and lower extremity motor 5/5 and symmetric  DATA:  Duplex ultrasound shows normal brachial artery anatomic pattern with normal artery diameter of 4 mm at the antecubital area , vein mapping shows cephalic vein 3-4 mm in the upper arm small at the forearm bilaterally also small in the left upper arm basilic vein is 4 mm bilaterally from the upper arm   ASSESSMENT:  Patient needs long-term hemodialysis access   PLAN: right brachiocephalic AV fistula October 12 2015. Risks benefits possible complications procedure details were discussed with the patient today including but not limited to bleeding infection ischemic steal non-maturation of the fistula he understands and agrees to proceed.  Fabienne Bruns, MD [facility] [place] Office: [phone] Pager: [phone]

## 2015-10-05 NOTE — Progress Notes (Signed)
Filed Vitals:   10/05/15 1520 10/05/15 1521  BP: 171/61 153/56  Pulse: 56 56  Temp: 97 F (36.1 C)   Resp: 18   Height: 5\' 6"  (1.676 m)   Weight: 152 lb (68.947 kg)   SpO2: 99%

## 2015-10-06 ENCOUNTER — Telehealth: Payer: Self-pay

## 2015-10-06 NOTE — Telephone Encounter (Signed)
Patient daughter, Eunice BlaseDebbie, called to cancel [name]'s surgery scheduled for 10/12/15. Debbie states, "he doesn't want to do it now." Instructed daughter to call if patient changed his mind and decided to proceed with surgery and/or with any questions. Debbie verbalized understanding. Right arm AVF procedure cancelled for 10/12/15. Dr. Darrick PennaFields notified.

## 2015-10-12 ENCOUNTER — Encounter (HOSPITAL_COMMUNITY): Admission: RE | Payer: Self-pay | Source: Ambulatory Visit

## 2015-10-12 ENCOUNTER — Ambulatory Visit (HOSPITAL_COMMUNITY): Admission: RE | Admit: 2015-10-12 | Payer: Medicare Other | Source: Ambulatory Visit | Admitting: Vascular Surgery

## 2015-10-12 SURGERY — ARTERIOVENOUS (AV) FISTULA CREATION
Anesthesia: Monitor Anesthesia Care | Site: Arm Lower | Laterality: Right

## 2015-10-14 ENCOUNTER — Other Ambulatory Visit: Payer: Medicare Other

## 2015-10-14 ENCOUNTER — Other Ambulatory Visit (HOSPITAL_COMMUNITY): Payer: Self-pay | Admitting: *Deleted

## 2015-10-17 ENCOUNTER — Encounter (HOSPITAL_COMMUNITY): Payer: Medicare Other

## 2015-10-17 ENCOUNTER — Other Ambulatory Visit: Payer: Medicare Other

## 2015-10-19 ENCOUNTER — Ambulatory Visit (INDEPENDENT_AMBULATORY_CARE_PROVIDER_SITE_OTHER): Payer: Medicare Other | Admitting: Endocrinology

## 2015-10-19 ENCOUNTER — Encounter: Payer: Self-pay | Admitting: Endocrinology

## 2015-10-19 VITALS — BP 158/32 | HR 70 | Temp 97.6°F | Resp 14 | Ht 66.0 in | Wt 166.0 lb

## 2015-10-19 DIAGNOSIS — E1165 Type 2 diabetes mellitus with hyperglycemia: Secondary | ICD-10-CM

## 2015-10-19 NOTE — Patient Instructions (Signed)
Call name of glucose meter  Check blood sugars on waking up 1-2  times a week Also check blood sugars about 2 hours after a meal and do this after different meals by rotation  Recommended blood sugar levels on waking up is 90-130 and about 2 hours after meal is 130-180  Please bring your blood sugar monitor to each visit, thank you

## 2015-10-19 NOTE — Progress Notes (Signed)
Patient ID: [name], male   DOB: 09/23/1930, 81 y.o.   MRN: [id number]           Reason for Appointment: Follow-up for Type 2 Diabetes  Referring physician: Cam HaiKimberly Shaw   History of Present Illness:          Date of diagnosis of type 2 diabetes mellitus: About 2011       Background history:  He was diagnosed to have diabetes by the primary care physician but details are not available He was also being evaluated at that time for renal dysfunction and poorly controlled blood pressure He may have been given metformin initially but this was stopped about 4 years ago Most likely has been on Januvia and no other medications in the past for his diabetes, he does not remember details  Recent history:   He has been taking Januvia 25 mg daily, reduce dose because of renal dysfunction He appears somewhat confused today and difficult to know whether he is checking his blood sugar at home or what his blood sugars are.  He gives conflicting answers about whether he has a glucose monitor at home or not However he says that he probably checked his sugar about a week ago Also was in the hospital and rehabilitation and blood sugars were generally ranging from 102-172 A1c is now better 6.1 as of last month  Current blood sugar patterns and problems identified:  His glucose today in the office was 196 and hour after eating a biscuit  He does not know what blood sugar monitor he has at home  Probably has some postprandial hyperglycemia  Oral hypoglycemic drugs the patient is taking are:    Januvia 25 mg daily    Side effects from medications have been: None  Compliance with the medical regimen: Fair Hypoglycemia: Never    Glucose monitoring:  done < one time a day         Glucometer: ?  Accu-Chek       Blood Glucose readings by recall:170-180   Self-care: The diet that the patient has been following is: None    Typical meal intake: Breakfast is biscuit/ cereal or some kind of biscuit,  sometimes eats at fast food restaurants or [employer] usually once a day, dinner at 6 pm.   Tries to have more vegetables at dinner time His snacks will be with fruit or oatmeal cookies        Dietician visit, most recent: 2011               Exercise:  not able to   Weight history: His weight has been variable based on his degree of fluid retention  Wt Readings from Last 3 Encounters:  10/19/15 166 lb (75.297 kg)  10/05/15 152 lb (68.947 kg)  09/24/15 150 lb 12.7 oz (68.4 kg)    Glycemic control:   Lab Results  Component Value Date   HGBA1C 6.1* 09/22/2015   HGBA1C 6.3 07/13/2015   HGBA1C 7.2* 03/23/2015   Lab Results  Component Value Date   LDLCALC 48 09/23/2015   CREATININE 2.8* 09/28/2015         Medication List       This list is accurate as of: 10/19/15 11:50 AM.  Always use your most recent med list.               ACCU-CHEK FASTCLIX LANCETS Misc  Use to check blood sugar 2 times per day dx code E11.9  allopurinol 100 MG tablet  Commonly known as:  ZYLOPRIM  TAKE 1 TABLET (100 MG TOTAL) BY MOUTH AT BEDTIME.     amLODipine 10 MG tablet  Commonly known as:  NORVASC  Take 1 tablet (10 mg total) by mouth at bedtime.     aspirin EC 81 MG tablet  Take 81 mg by mouth every evening.     atorvastatin 80 MG tablet  Commonly known as:  LIPITOR  Take 1 tablet (80 mg total) by mouth daily at 6 PM.     calcitRIOL 0.25 MCG capsule  Commonly known as:  ROCALTROL  Take 0.25 mcg by mouth every morning.     cholecalciferol 1000 UNITS tablet  Commonly known as:  VITAMIN D  Take 1,000 Units by mouth 2 (two) times daily.     clopidogrel 75 MG tablet  Commonly known as:  PLAVIX  TAKE 1 TABLET BY MOUTH DAILY     feeding supplement Liqd  Take 1 Container by mouth 3 (three) times daily between meals.     ferrous sulfate 325 (65 FE) MG tablet  Take 325 mg by mouth 2 (two) times daily with a meal.     furosemide 80 MG tablet  Commonly known as:  LASIX  Take 1  tablet (80 mg total) by mouth daily.     glucose blood test strip  Commonly known as:  ACCU-CHEK AVIVA PLUS  Use as instructed to check blood sugar 2 times per day dx code E11.9     hydrALAZINE 50 MG tablet  Commonly known as:  APRESOLINE  Take 1 tablet (50 mg total) by mouth 2 (two) times daily.     isosorbide mononitrate 60 MG 24 hr tablet  Commonly known as:  IMDUR  Take 1 tablet (60 mg total) by mouth daily.     metoprolol succinate 50 MG 24 hr tablet  Commonly known as:  TOPROL-XL  Take 1 tablet (50 mg total) by mouth daily. Take with or immediately following a meal.     pantoprazole 40 MG tablet  Commonly known as:  PROTONIX  Take 1 tablet (40 mg total) by mouth daily.     polyethylene glycol packet  Commonly known as:  MIRALAX / GLYCOLAX  Take 17 g by mouth 2 (two) times daily.     polyethylene glycol packet  Commonly known as:  MIRALAX / GLYCOLAX  Take 17 g by mouth daily as needed for mild constipation.     PRESERVISION AREDS PO  Take 2 tablets by mouth daily.     sitaGLIPtin 25 MG tablet  Commonly known as:  JANUVIA  Take 1 tablet (25 mg total) by mouth daily.        Allergies:  Allergies  Allergen Reactions  . Labetalol Swelling    angioedema    Past Medical History  Diagnosis Date  . Diabetes mellitus   . Hypertension   . Hypercholesteremia   . Gout   . PVD (peripheral vascular disease) (HCC) 7/05    LCE, known 60% RICA 7/12  . CAD (coronary artery disease) 2002    non critical  . Macular degeneration   . Chronic renal disease, stage III   . Claudication (HCC)   . Palpitations     PVCs and PSVT on Holter monitoring  . Bilateral carotid artery disease (HCC)     status post left carotid endarterectomy performed by Dr. Madilyn Fireman 05/12/04  . Anemia     Past Surgical History  Procedure Laterality Date  .  2d echocardiogram  03/31/2008    EF greater than 55%  . Cardiovascular stress test  06/30/2010    Nonischemic. Low risk  . Cerebral  angiogram  04/03/2004    High-grade 80% ostial L ICA stenosis. Medical management.  . Abdominal aortogram  09/01/2007    Widely patent renal arteries. Medical management.  . Peripheral vascular angiogram  05/07/2011    No evidence of intracranial occlusions, stenosis, dissections, or aneurysms seen  . Carotid doppler  06/09/2012    R Vertebral-known occluded vessel, R Bulb/Proximal ICA-moderate to severe amount of plaque w/50-69% diameter reduction, L Subclavian-50-69% diameter reduction, L CEA-demonstrated increased velocities w/o evidence of hemodynamically significant stenosis  . Cardiac catheterization  10/17/2001    No significant CAD, normal LV systolic function.  . Carotid endarterectomy Left July 2005  . Lower extremity angiogram Bilateral 11/01/2014    Procedure: LOWER EXTREMITY ANGIOGRAM;  Surgeon: [name], MD;  Location: [facility] CATH LAB;  Service: Cardiovascular;  Laterality: Bilateral;  . Abdominal angiogram  11/01/2014    Procedure: ABDOMINAL ANGIOGRAM;  Surgeon: [name], MD;  Location[facility] C[facility] LAB;  Service: Cardiovascular;;  . Knee surgery    . Hernia repair      Family History  Problem Relation Age of Onset  . Stroke Sister   . Heart disease Brother   . Diabetes Neg Hx     Social History:  reports that he has quit smoking. He has never used smokeless tobacco. He reports that he does not drink alcohol. His drug history is not on file.    Review of Systems    Lipid history: He has not been on any medications for lipids    Lab Results  Component Value Date   CHOL 110 09/23/2015   HDL 41 09/23/2015   LDLCALC 48 09/23/2015   TRIG 105 09/23/2015   CHOLHDL 2.7 09/23/2015           RENAL insufficiency: this is very significant This is apparently from hypertensive kidney disease and has had complete locations including anemia, secondary hyperparathyroidism managed with Procrit and calcitriol respectively  Lab Results  Component Value Date   CREATININE 2.8*  09/28/2015   BUN 102* 09/28/2015   NA 142 09/28/2015   K 4.1 09/28/2015   CL 106 09/24/2015   CO2 24 09/24/2015    Hypertension: Has had this for at least 30-40 years and has been difficult to control.  Followed by nephrologist. Currently on amlodipine and Hytrin  Neurological:  Has no numbness, burning, pains or tingling in feet.  May have coldness in his feet. Diabetic foot exam normal in 7/16   Physical Examination:  BP 158/32 mmHg  Pulse 70  Temp(Src) 97.6 F (36.4 C)  Resp 14  Ht  (1.676 m)  Wt 166 lb (75.297 kg)  BMI 26.81 kg/m2  SpO2 97%        ASSESSMENT:  Diabetes type 2 with BMI 26 He appears to have fairly good blood sugar control with his A1c 6.3 as of 11/16 now 6.3%; however not clear how  This may not accurately assess his control because of his advanced renal insufficiency.   Fructosamine is not available Currently not clear if he is monitoring his blood sugars or which meter he has Overall control is adequate considering his age and multiple medical problems. He is taking 25 mg Januvia and this is appearing to be adequate  PLAN:   He will continue to take Januvia 25 mg daily for now  He will call back to let us know which meter he is using  Check blood sugars more regularly including postprandially  Follow-up in 3 months    Patient Instructions  Call name of glucose meter  Check blood sugars on waking up 1-2  times a week Also check blood sugars about 2 hours after a meal and do this after different meals by rotation  Recommended blood sugar levels on waking up is 90-130 and about 2 hours after meal is 130-180  Please bring your blood sugar monitor to each visit, thank you     [facility] 10/19/2015, 11:50 AM   Note: This office note was prepared with Dragon voice recognition system technology. Any transcriptional errors that result from this process are unintentional.

## 2015-11-16 ENCOUNTER — Inpatient Hospital Stay (HOSPITAL_COMMUNITY): Payer: Medicare Other

## 2015-11-16 ENCOUNTER — Encounter (HOSPITAL_COMMUNITY)
Admission: EM | Disposition: A | Discharge status: Home / Self Care | Payer: Medicare Other | Source: Home / Self Care | Attending: Internal Medicine

## 2015-11-16 ENCOUNTER — Emergency Department (HOSPITAL_COMMUNITY): Payer: Medicare Other

## 2015-11-16 ENCOUNTER — Encounter (HOSPITAL_COMMUNITY): Payer: Self-pay | Admitting: Emergency Medicine

## 2015-11-16 ENCOUNTER — Inpatient Hospital Stay (HOSPITAL_COMMUNITY)
Admission: EM | Admit: 2015-11-16 | Discharge: 2015-11-26 | DRG: 280 | Disposition: A | Discharge status: Home / Self Care | Payer: Medicare Other | Attending: Internal Medicine | Admitting: Internal Medicine

## 2015-11-16 DIAGNOSIS — I5033 Acute on chronic diastolic (congestive) heart failure: Secondary | ICD-10-CM | POA: Diagnosis present

## 2015-11-16 DIAGNOSIS — Z7984 Long term (current) use of oral hypoglycemic drugs: Secondary | ICD-10-CM | POA: Diagnosis not present

## 2015-11-16 DIAGNOSIS — R410 Disorientation, unspecified: Secondary | ICD-10-CM | POA: Diagnosis not present

## 2015-11-16 DIAGNOSIS — Y848 Other medical procedures as the cause of abnormal reaction of the patient, or of later complication, without mention of misadventure at the time of the procedure: Secondary | ICD-10-CM | POA: Diagnosis present

## 2015-11-16 DIAGNOSIS — I129 Hypertensive chronic kidney disease with stage 1 through stage 4 chronic kidney disease, or unspecified chronic kidney disease: Secondary | ICD-10-CM | POA: Diagnosis not present

## 2015-11-16 DIAGNOSIS — I2510 Atherosclerotic heart disease of native coronary artery without angina pectoris: Secondary | ICD-10-CM | POA: Diagnosis present

## 2015-11-16 DIAGNOSIS — J189 Pneumonia, unspecified organism: Secondary | ICD-10-CM | POA: Diagnosis present

## 2015-11-16 DIAGNOSIS — Z452 Encounter for adjustment and management of vascular access device: Secondary | ICD-10-CM

## 2015-11-16 DIAGNOSIS — I272 Other secondary pulmonary hypertension: Secondary | ICD-10-CM | POA: Diagnosis present

## 2015-11-16 DIAGNOSIS — T508X5A Adverse effect of diagnostic agents, initial encounter: Secondary | ICD-10-CM | POA: Diagnosis not present

## 2015-11-16 DIAGNOSIS — I252 Old myocardial infarction: Secondary | ICD-10-CM

## 2015-11-16 DIAGNOSIS — H3530 Unspecified macular degeneration: Secondary | ICD-10-CM | POA: Diagnosis present

## 2015-11-16 DIAGNOSIS — J810 Acute pulmonary edema: Secondary | ICD-10-CM | POA: Diagnosis not present

## 2015-11-16 DIAGNOSIS — N189 Chronic kidney disease, unspecified: Secondary | ICD-10-CM

## 2015-11-16 DIAGNOSIS — I5032 Chronic diastolic (congestive) heart failure: Secondary | ICD-10-CM | POA: Diagnosis not present

## 2015-11-16 DIAGNOSIS — Z6822 Body mass index (BMI) 22.0-22.9, adult: Secondary | ICD-10-CM

## 2015-11-16 DIAGNOSIS — R441 Visual hallucinations: Secondary | ICD-10-CM | POA: Diagnosis not present

## 2015-11-16 DIAGNOSIS — D510 Vitamin B12 deficiency anemia due to intrinsic factor deficiency: Secondary | ICD-10-CM | POA: Diagnosis present

## 2015-11-16 DIAGNOSIS — E46 Unspecified protein-calorie malnutrition: Secondary | ICD-10-CM | POA: Diagnosis present

## 2015-11-16 DIAGNOSIS — G3183 Dementia with Lewy bodies: Secondary | ICD-10-CM | POA: Diagnosis present

## 2015-11-16 DIAGNOSIS — N2581 Secondary hyperparathyroidism of renal origin: Secondary | ICD-10-CM | POA: Diagnosis present

## 2015-11-16 DIAGNOSIS — R0602 Shortness of breath: Secondary | ICD-10-CM

## 2015-11-16 DIAGNOSIS — N186 End stage renal disease: Secondary | ICD-10-CM

## 2015-11-16 DIAGNOSIS — N184 Chronic kidney disease, stage 4 (severe): Secondary | ICD-10-CM | POA: Diagnosis not present

## 2015-11-16 DIAGNOSIS — D638 Anemia in other chronic diseases classified elsewhere: Secondary | ICD-10-CM | POA: Diagnosis present

## 2015-11-16 DIAGNOSIS — N185 Chronic kidney disease, stage 5: Secondary | ICD-10-CM | POA: Diagnosis not present

## 2015-11-16 DIAGNOSIS — E1122 Type 2 diabetes mellitus with diabetic chronic kidney disease: Secondary | ICD-10-CM | POA: Diagnosis present

## 2015-11-16 DIAGNOSIS — M10329 Gout due to renal impairment, unspecified elbow: Secondary | ICD-10-CM | POA: Diagnosis present

## 2015-11-16 DIAGNOSIS — K5900 Constipation, unspecified: Secondary | ICD-10-CM | POA: Diagnosis not present

## 2015-11-16 DIAGNOSIS — I10 Essential (primary) hypertension: Secondary | ICD-10-CM | POA: Diagnosis present

## 2015-11-16 DIAGNOSIS — E1165 Type 2 diabetes mellitus with hyperglycemia: Secondary | ICD-10-CM | POA: Diagnosis present

## 2015-11-16 DIAGNOSIS — I214 Non-ST elevation (NSTEMI) myocardial infarction: Secondary | ICD-10-CM | POA: Diagnosis present

## 2015-11-16 DIAGNOSIS — R509 Fever, unspecified: Secondary | ICD-10-CM | POA: Diagnosis present

## 2015-11-16 DIAGNOSIS — F0280 Dementia in other diseases classified elsewhere without behavioral disturbance: Secondary | ICD-10-CM | POA: Diagnosis present

## 2015-11-16 DIAGNOSIS — D62 Acute posthemorrhagic anemia: Secondary | ICD-10-CM | POA: Diagnosis present

## 2015-11-16 DIAGNOSIS — IMO0002 Reserved for concepts with insufficient information to code with codable children: Secondary | ICD-10-CM

## 2015-11-16 DIAGNOSIS — R7989 Other specified abnormal findings of blood chemistry: Secondary | ICD-10-CM

## 2015-11-16 DIAGNOSIS — M109 Gout, unspecified: Secondary | ICD-10-CM | POA: Diagnosis present

## 2015-11-16 DIAGNOSIS — R778 Other specified abnormalities of plasma proteins: Secondary | ICD-10-CM

## 2015-11-16 DIAGNOSIS — F419 Anxiety disorder, unspecified: Secondary | ICD-10-CM | POA: Diagnosis not present

## 2015-11-16 DIAGNOSIS — I132 Hypertensive heart and chronic kidney disease with heart failure and with stage 5 chronic kidney disease, or end stage renal disease: Secondary | ICD-10-CM | POA: Diagnosis present

## 2015-11-16 DIAGNOSIS — E1121 Type 2 diabetes mellitus with diabetic nephropathy: Secondary | ICD-10-CM | POA: Diagnosis present

## 2015-11-16 DIAGNOSIS — Z7982 Long term (current) use of aspirin: Secondary | ICD-10-CM

## 2015-11-16 DIAGNOSIS — Z87891 Personal history of nicotine dependence: Secondary | ICD-10-CM

## 2015-11-16 DIAGNOSIS — E785 Hyperlipidemia, unspecified: Secondary | ICD-10-CM | POA: Diagnosis present

## 2015-11-16 DIAGNOSIS — J9601 Acute respiratory failure with hypoxia: Secondary | ICD-10-CM

## 2015-11-16 DIAGNOSIS — F0390 Unspecified dementia without behavioral disturbance: Secondary | ICD-10-CM | POA: Diagnosis present

## 2015-11-16 DIAGNOSIS — Z7902 Long term (current) use of antithrombotics/antiplatelets: Secondary | ICD-10-CM | POA: Diagnosis not present

## 2015-11-16 DIAGNOSIS — E1151 Type 2 diabetes mellitus with diabetic peripheral angiopathy without gangrene: Secondary | ICD-10-CM | POA: Diagnosis present

## 2015-11-16 DIAGNOSIS — N179 Acute kidney failure, unspecified: Secondary | ICD-10-CM | POA: Diagnosis not present

## 2015-11-16 DIAGNOSIS — J440 Chronic obstructive pulmonary disease with acute lower respiratory infection: Secondary | ICD-10-CM | POA: Diagnosis present

## 2015-11-16 DIAGNOSIS — I213 ST elevation (STEMI) myocardial infarction of unspecified site: Secondary | ICD-10-CM

## 2015-11-16 DIAGNOSIS — E8779 Other fluid overload: Secondary | ICD-10-CM | POA: Diagnosis not present

## 2015-11-16 DIAGNOSIS — D649 Anemia, unspecified: Secondary | ICD-10-CM

## 2015-11-16 DIAGNOSIS — R0600 Dyspnea, unspecified: Secondary | ICD-10-CM

## 2015-11-16 DIAGNOSIS — Z419 Encounter for procedure for purposes other than remedying health state, unspecified: Secondary | ICD-10-CM

## 2015-11-16 DIAGNOSIS — I209 Angina pectoris, unspecified: Secondary | ICD-10-CM

## 2015-11-16 DIAGNOSIS — E876 Hypokalemia: Secondary | ICD-10-CM | POA: Diagnosis present

## 2015-11-16 DIAGNOSIS — N183 Chronic kidney disease, stage 3 (moderate): Secondary | ICD-10-CM | POA: Diagnosis not present

## 2015-11-16 DIAGNOSIS — I5031 Acute diastolic (congestive) heart failure: Secondary | ICD-10-CM | POA: Diagnosis not present

## 2015-11-16 DIAGNOSIS — D631 Anemia in chronic kidney disease: Secondary | ICD-10-CM | POA: Diagnosis present

## 2015-11-16 HISTORY — PX: CARDIAC CATHETERIZATION: SHX172

## 2015-11-16 LAB — COMPREHENSIVE METABOLIC PANEL
ALBUMIN: 2.8 g/dL — AB (ref 3.5–5.0)
ALT: 19 U/L (ref 17–63)
ANION GAP: 18 — AB (ref 5–15)
AST: 51 U/L — ABNORMAL HIGH (ref 15–41)
Alkaline Phosphatase: 79 U/L (ref 38–126)
BUN: 114 mg/dL — ABNORMAL HIGH (ref 6–20)
CO2: 26 mmol/L (ref 22–32)
Calcium: 8.8 mg/dL — ABNORMAL LOW (ref 8.9–10.3)
Chloride: 95 mmol/L — ABNORMAL LOW (ref 101–111)
Creatinine, Ser: 3.87 mg/dL — ABNORMAL HIGH (ref 0.61–1.24)
GFR calc Af Amer: 15 mL/min — ABNORMAL LOW (ref >60)
GFR calc non Af Amer: 13 mL/min — ABNORMAL LOW (ref >60)
GLUCOSE: 163 mg/dL — AB (ref 65–99)
POTASSIUM: 2.6 mmol/L — AB (ref 3.5–5.1)
SODIUM: 139 mmol/L (ref 135–145)
Total Bilirubin: 1 mg/dL (ref 0.3–1.2)
Total Protein: 5.1 g/dL — ABNORMAL LOW (ref 6.5–8.1)

## 2015-11-16 LAB — CBC
HCT: 21.9 % — ABNORMAL LOW (ref 39.0–52.0)
Hemoglobin: 7.3 g/dL — ABNORMAL LOW (ref 13.0–17.0)
MCH: 29.7 pg (ref 26.0–34.0)
MCHC: 33.3 g/dL (ref 30.0–36.0)
MCV: 89 fL (ref 78.0–100.0)
PLATELETS: 203 10*3/uL (ref 150–400)
RBC: 2.46 MIL/uL — AB (ref 4.22–5.81)
RDW: 16.9 % — AB (ref 11.5–15.5)
WBC: 12 10*3/uL — AB (ref 4.0–10.5)

## 2015-11-16 LAB — BRAIN NATRIURETIC PEPTIDE: B NATRIURETIC PEPTIDE 5: 2120.5 pg/mL — AB (ref 0.0–100.0)

## 2015-11-16 LAB — BASIC METABOLIC PANEL
Anion gap: 19 — ABNORMAL HIGH (ref 5–15)
BUN: 120 mg/dL — AB (ref 6–20)
CALCIUM: 8.9 mg/dL (ref 8.9–10.3)
CHLORIDE: 91 mmol/L — AB (ref 101–111)
CO2: 28 mmol/L (ref 22–32)
CREATININE: 3.96 mg/dL — AB (ref 0.61–1.24)
GFR, EST AFRICAN AMERICAN: 15 mL/min — AB (ref >60)
GFR, EST NON AFRICAN AMERICAN: 13 mL/min — AB (ref >60)
Glucose, Bld: 178 mg/dL — ABNORMAL HIGH (ref 65–99)
Potassium: 3.6 mmol/L (ref 3.5–5.1)
SODIUM: 138 mmol/L (ref 135–145)

## 2015-11-16 LAB — TROPONIN I
TROPONIN I: 8.35 ng/mL — AB (ref <0.031)
TROPONIN I: 9.49 ng/mL — AB (ref <0.031)

## 2015-11-16 LAB — GLUCOSE, CAPILLARY
GLUCOSE-CAPILLARY: 164 mg/dL — AB (ref 65–99)
Glucose-Capillary: 164 mg/dL — ABNORMAL HIGH (ref 65–99)

## 2015-11-16 LAB — MAGNESIUM
MAGNESIUM: 2.6 mg/dL — AB (ref 1.7–2.4)
Magnesium: 2.6 mg/dL — ABNORMAL HIGH (ref 1.7–2.4)

## 2015-11-16 LAB — APTT: APTT: 23 s — AB (ref 24–37)

## 2015-11-16 LAB — PROTIME-INR
INR: 1.13 (ref 0.00–1.49)
PROTHROMBIN TIME: 14.7 s (ref 11.6–15.2)

## 2015-11-16 LAB — I-STAT TROPONIN, ED: Troponin i, poc: 8.82 ng/mL (ref 0.00–0.08)

## 2015-11-16 LAB — PREPARE RBC (CROSSMATCH)

## 2015-11-16 LAB — IRON AND TIBC
Iron: 97 ug/dL (ref 45–182)
Saturation Ratios: 41 % — ABNORMAL HIGH (ref 17.9–39.5)
TIBC: 235 ug/dL — ABNORMAL LOW (ref 250–450)
UIBC: 138 ug/dL

## 2015-11-16 LAB — VITAMIN B12: VITAMIN B 12: 352 pg/mL (ref 180–914)

## 2015-11-16 LAB — MRSA PCR SCREENING: MRSA BY PCR: NEGATIVE

## 2015-11-16 LAB — POCT ACTIVATED CLOTTING TIME: Activated Clotting Time: 580 seconds

## 2015-11-16 SURGERY — LEFT HEART CATH AND CORONARY ANGIOGRAPHY
Laterality: Right

## 2015-11-16 MED ORDER — AMLODIPINE BESYLATE 10 MG PO TABS
10.0000 mg | ORAL_TABLET | Freq: Every day | ORAL | Status: DC
  Administered 2015-11-17: 10 mg via ORAL
  Filled 2015-11-16 (×2): qty 1

## 2015-11-16 MED ORDER — ACETAMINOPHEN 325 MG PO TABS: 650.0000 mg | ORAL_TABLET | ORAL | Status: DC | PRN

## 2015-11-16 MED ORDER — HEPARIN (PORCINE) IN NACL 2-0.9 UNIT/ML-% IJ SOLN
INTRAMUSCULAR | Status: AC
  Filled 2015-11-16: qty 1500

## 2015-11-16 MED ORDER — NITROGLYCERIN 0.4 MG/SPRAY TL SOLN
Status: AC
  Filled 2015-11-16: qty 4.9

## 2015-11-16 MED ORDER — FUROSEMIDE 10 MG/ML IJ SOLN
80.0000 mg | Freq: Once | INTRAMUSCULAR | Status: AC
  Administered 2015-11-16: 80 mg via INTRAVENOUS

## 2015-11-16 MED ORDER — PANTOPRAZOLE SODIUM 40 MG PO TBEC: 40.0000 mg | DELAYED_RELEASE_TABLET | Freq: Every day | ORAL | Status: DC

## 2015-11-16 MED ORDER — SODIUM CHLORIDE 0.9 % IV SOLN
250.0000 mg | INTRAVENOUS | Status: DC | PRN
  Administered 2015-11-16: 1 mg/kg/h via INTRAVENOUS

## 2015-11-16 MED ORDER — METOPROLOL TARTRATE 1 MG/ML IV SOLN
INTRAVENOUS | Status: AC
  Filled 2015-11-16: qty 5

## 2015-11-16 MED ORDER — FUROSEMIDE 10 MG/ML IJ SOLN
120.0000 mg | Freq: Once | INTRAMUSCULAR | Status: AC
  Administered 2015-11-17: 120 mg via INTRAVENOUS
  Filled 2015-11-16: qty 12

## 2015-11-16 MED ORDER — LINAGLIPTIN 5 MG PO TABS
5.0000 mg | ORAL_TABLET | Freq: Every day | ORAL | Status: DC
  Administered 2015-11-17: 5 mg via ORAL
  Filled 2015-11-16: qty 1

## 2015-11-16 MED ORDER — NITROGLYCERIN IN D5W 200-5 MCG/ML-% IV SOLN
5.0000 ug/min | INTRAVENOUS | Status: DC
  Administered 2015-11-16 – 2015-11-17 (×2): 40 ug/min via INTRAVENOUS
  Filled 2015-11-16: qty 250

## 2015-11-16 MED ORDER — FUROSEMIDE 80 MG PO TABS
80.0000 mg | ORAL_TABLET | Freq: Every day | ORAL | Status: DC
  Filled 2015-11-16: qty 1

## 2015-11-16 MED ORDER — FUROSEMIDE 10 MG/ML IJ SOLN
INTRAMUSCULAR | Status: AC
  Filled 2015-11-16: qty 4

## 2015-11-16 MED ORDER — METOPROLOL SUCCINATE ER 100 MG PO TB24
100.0000 mg | ORAL_TABLET | Freq: Every day | ORAL | Status: DC
  Administered 2015-11-16: 100 mg via ORAL
  Filled 2015-11-16 (×2): qty 1

## 2015-11-16 MED ORDER — ASPIRIN 81 MG PO CHEW
324.0000 mg | CHEWABLE_TABLET | Freq: Once | ORAL | Status: AC
  Administered 2015-11-16: 324 mg via ORAL
  Filled 2015-11-16: qty 4

## 2015-11-16 MED ORDER — INSULIN ASPART 100 UNIT/ML ~~LOC~~ SOLN
0.0000 [IU] | Freq: Three times a day (TID) | SUBCUTANEOUS | Status: DC
  Administered 2015-11-17: 2 [IU] via SUBCUTANEOUS
  Administered 2015-11-17 – 2015-11-18 (×4): 3 [IU] via SUBCUTANEOUS
  Administered 2015-11-19: 2 [IU] via SUBCUTANEOUS
  Administered 2015-11-20: 3 [IU] via SUBCUTANEOUS
  Administered 2015-11-21: 5 [IU] via SUBCUTANEOUS
  Administered 2015-11-21 – 2015-11-22 (×2): 3 [IU] via SUBCUTANEOUS
  Administered 2015-11-22: 5 [IU] via SUBCUTANEOUS
  Administered 2015-11-22: 2 [IU] via SUBCUTANEOUS
  Administered 2015-11-24: 11 [IU] via SUBCUTANEOUS
  Administered 2015-11-24: 3 [IU] via SUBCUTANEOUS
  Administered 2015-11-25: 2 [IU] via SUBCUTANEOUS
  Administered 2015-11-25 – 2015-11-26 (×2): 3 [IU] via SUBCUTANEOUS
  Administered 2015-11-26: 2 [IU] via SUBCUTANEOUS

## 2015-11-16 MED ORDER — HEPARIN (PORCINE) IN NACL 100-0.45 UNIT/ML-% IJ SOLN
900.0000 [IU]/h | INTRAMUSCULAR | Status: DC
  Administered 2015-11-16: 900 [IU]/h via INTRAVENOUS
  Filled 2015-11-16: qty 250

## 2015-11-16 MED ORDER — SODIUM CHLORIDE 0.9 % IV SOLN: 250.0000 mL | INTRAVENOUS | Status: DC | PRN

## 2015-11-16 MED ORDER — POTASSIUM CHLORIDE CRYS ER 20 MEQ PO TBCR
EXTENDED_RELEASE_TABLET | ORAL | Status: AC
  Filled 2015-11-16: qty 1

## 2015-11-16 MED ORDER — BOOST / RESOURCE BREEZE PO LIQD
1.0000 | Freq: Three times a day (TID) | ORAL | Status: DC
  Administered 2015-11-17 – 2015-11-26 (×23): 1 via ORAL

## 2015-11-16 MED ORDER — HEPARIN SODIUM (PORCINE) 5000 UNIT/ML IJ SOLN
5000.0000 [IU] | Freq: Three times a day (TID) | INTRAMUSCULAR | Status: DC
  Administered 2015-11-16 – 2015-11-17 (×3): 5000 [IU] via SUBCUTANEOUS
  Filled 2015-11-16 (×5): qty 1

## 2015-11-16 MED ORDER — NITROGLYCERIN 1 MG/10 ML FOR IR/CATH LAB
INTRA_ARTERIAL | Status: AC
  Filled 2015-11-16: qty 10

## 2015-11-16 MED ORDER — CLOPIDOGREL BISULFATE 75 MG PO TABS
75.0000 mg | ORAL_TABLET | Freq: Every day | ORAL | Status: DC
  Administered 2015-11-17 – 2015-11-26 (×9): 75 mg via ORAL
  Filled 2015-11-16 (×11): qty 1

## 2015-11-16 MED ORDER — ATORVASTATIN CALCIUM 80 MG PO TABS: 80.0000 mg | ORAL_TABLET | Freq: Every day | ORAL | Status: DC

## 2015-11-16 MED ORDER — ALLOPURINOL 100 MG PO TABS: 100.0000 mg | ORAL_TABLET | Freq: Every day | ORAL | Status: DC

## 2015-11-16 MED ORDER — SODIUM CHLORIDE 0.9 % IV SOLN: Freq: Once | INTRAVENOUS | Status: DC

## 2015-11-16 MED ORDER — ONDANSETRON HCL 4 MG/2ML IJ SOLN
4.0000 mg | Freq: Four times a day (QID) | INTRAMUSCULAR | Status: DC | PRN
  Administered 2015-11-17: 4 mg via INTRAVENOUS
  Filled 2015-11-16: qty 2

## 2015-11-16 MED ORDER — HEPARIN SODIUM (PORCINE) 5000 UNIT/ML IJ SOLN: 5000.0000 [IU] | Freq: Three times a day (TID) | INTRAMUSCULAR | Status: DC

## 2015-11-16 MED ORDER — POTASSIUM CHLORIDE 20 MEQ PO PACK
20.0000 meq | PACK | ORAL | Status: DC
  Administered 2015-11-16 (×2): 20 meq via ORAL
  Filled 2015-11-16 (×2): qty 1

## 2015-11-16 MED ORDER — ALBUTEROL SULFATE (2.5 MG/3ML) 0.083% IN NEBU
2.5000 mg | INHALATION_SOLUTION | RESPIRATORY_TRACT | Status: DC | PRN
Start: 2015-11-16 — End: 2015-11-18
  Administered 2015-11-18: 2.5 mg via RESPIRATORY_TRACT
  Filled 2015-11-16: qty 3

## 2015-11-16 MED ORDER — LIDOCAINE HCL (PF) 1 % IJ SOLN
INTRAMUSCULAR | Status: AC
  Filled 2015-11-16: qty 30

## 2015-11-16 MED ORDER — FERROUS SULFATE 325 (65 FE) MG PO TABS
325.0000 mg | ORAL_TABLET | Freq: Two times a day (BID) | ORAL | Status: DC
  Filled 2015-11-16 (×3): qty 1

## 2015-11-16 MED ORDER — SODIUM CHLORIDE 0.9 % IJ SOLN
3.0000 mL | Freq: Two times a day (BID) | INTRAMUSCULAR | Status: DC
  Administered 2015-11-17 (×2): 3 mL via INTRAVENOUS

## 2015-11-16 MED ORDER — DARBEPOETIN ALFA 150 MCG/0.3ML IJ SOSY
150.0000 ug | PREFILLED_SYRINGE | INTRAMUSCULAR | Status: DC
  Administered 2015-11-16 – 2015-11-23 (×2): 150 ug via SUBCUTANEOUS
  Filled 2015-11-16 (×2): qty 0.3

## 2015-11-16 MED ORDER — HEPARIN SODIUM (PORCINE) 5000 UNIT/ML IJ SOLN
4000.0000 [IU] | Freq: Once | INTRAMUSCULAR | Status: AC
  Administered 2015-11-16: 4000 [IU] via INTRAVENOUS
  Filled 2015-11-16: qty 1

## 2015-11-16 MED ORDER — POLYETHYLENE GLYCOL 3350 17 G PO PACK
17.0000 g | PACK | Freq: Every day | ORAL | Status: DC | PRN
  Administered 2015-11-21 – 2015-11-22 (×2): 17 g via ORAL
  Filled 2015-11-16 (×3): qty 1

## 2015-11-16 MED ORDER — BIVALIRUDIN BOLUS VIA INFUSION - CUPID
INTRAVENOUS | Status: DC | PRN
  Administered 2015-11-16: 56.475 mg via INTRAVENOUS

## 2015-11-16 MED ORDER — PANTOPRAZOLE SODIUM 40 MG IV SOLR
40.0000 mg | INTRAVENOUS | Status: DC
  Administered 2015-11-16 – 2015-11-18 (×3): 40 mg via INTRAVENOUS
  Filled 2015-11-16 (×4): qty 40

## 2015-11-16 MED ORDER — BIVALIRUDIN 250 MG IV SOLR
INTRAVENOUS | Status: AC
  Filled 2015-11-16: qty 250

## 2015-11-16 MED ORDER — CALCITRIOL 0.25 MCG PO CAPS: 0.2500 ug | ORAL_CAPSULE | Freq: Every morning | ORAL | Status: DC

## 2015-11-16 MED ORDER — HYDRALAZINE HCL 50 MG PO TABS
50.0000 mg | ORAL_TABLET | Freq: Three times a day (TID) | ORAL | Status: DC
Start: 2015-11-16 — End: 2015-11-17
  Administered 2015-11-17: 50 mg via ORAL
  Filled 2015-11-16 (×4): qty 1

## 2015-11-16 MED ORDER — METOPROLOL TARTRATE 1 MG/ML IV SOLN
5.0000 mg | Freq: Once | INTRAVENOUS | Status: AC
  Administered 2015-11-16: 5 mg via INTRAVENOUS

## 2015-11-16 MED ORDER — NITROGLYCERIN IN D5W 200-5 MCG/ML-% IV SOLN
INTRAVENOUS | Status: AC
  Filled 2015-11-16: qty 250

## 2015-11-16 MED ORDER — SODIUM CHLORIDE 0.9 % IJ SOLN: 3.0000 mL | INTRAMUSCULAR | Status: DC | PRN

## 2015-11-16 MED ORDER — ASPIRIN EC 81 MG PO TBEC
81.0000 mg | DELAYED_RELEASE_TABLET | Freq: Every evening | ORAL | Status: DC
  Administered 2015-11-17 – 2015-11-25 (×9): 81 mg via ORAL
  Filled 2015-11-16 (×10): qty 1

## 2015-11-16 MED ORDER — NITROGLYCERIN 0.4 MG SL SUBL
0.4000 mg | SUBLINGUAL_TABLET | SUBLINGUAL | Status: AC | PRN
  Administered 2015-11-16 (×3): 0.4 mg via SUBLINGUAL
  Filled 2015-11-16: qty 1

## 2015-11-16 MED ORDER — SODIUM CHLORIDE 0.9 % IV SOLN
INTRAVENOUS | Status: DC
  Administered 2015-11-16: 11:00:00 via INTRAVENOUS

## 2015-11-16 MED ORDER — SODIUM CHLORIDE 0.9 % IV SOLN: INTRAVENOUS | Status: DC

## 2015-11-16 MED ORDER — CALCITRIOL 0.5 MCG PO CAPS
0.5000 ug | ORAL_CAPSULE | Freq: Every day | ORAL | Status: DC
  Administered 2015-11-16 – 2015-11-26 (×10): 0.5 ug via ORAL
  Filled 2015-11-16 (×11): qty 1

## 2015-11-16 MED ORDER — ALLOPURINOL 300 MG PO TABS
300.0000 mg | ORAL_TABLET | Freq: Every day | ORAL | Status: DC
  Administered 2015-11-16 – 2015-11-17 (×2): 300 mg via ORAL
  Filled 2015-11-16 (×2): qty 1

## 2015-11-16 MED ORDER — ISOSORBIDE MONONITRATE ER 60 MG PO TB24
60.0000 mg | ORAL_TABLET | Freq: Every day | ORAL | Status: DC
Start: 2015-11-17 — End: 2015-11-26
  Administered 2015-11-17 – 2015-11-26 (×8): 60 mg via ORAL
  Filled 2015-11-16 (×4): qty 2
  Filled 2015-11-16 (×2): qty 1
  Filled 2015-11-16 (×3): qty 2
  Filled 2015-11-16: qty 1

## 2015-11-16 MED ORDER — HEPARIN SODIUM (PORCINE) 1000 UNIT/ML IJ SOLN
INTRAMUSCULAR | Status: AC
  Filled 2015-11-16: qty 1

## 2015-11-16 MED ORDER — ATORVASTATIN CALCIUM 80 MG PO TABS
80.0000 mg | ORAL_TABLET | Freq: Every day | ORAL | Status: DC
  Administered 2015-11-16 – 2015-11-25 (×10): 80 mg via ORAL
  Filled 2015-11-16 (×11): qty 1

## 2015-11-16 MED ORDER — POTASSIUM CHLORIDE CRYS ER 20 MEQ PO TBCR
20.0000 meq | EXTENDED_RELEASE_TABLET | ORAL | Status: AC
  Administered 2015-11-16 (×2): 20 meq via ORAL
  Filled 2015-11-16 (×3): qty 1

## 2015-11-16 SURGICAL SUPPLY — 11 items
CATH INFINITI 5FR MPB2 (CATHETERS) ×4 IMPLANT
CATH INFINITI JR4 5F (CATHETERS) ×4 IMPLANT
CATH OPTICROSS 40MHZ (CATHETERS) ×4 IMPLANT
CATH VISTA GUIDE 6FR XB3.5 (CATHETERS) ×4 IMPLANT
KIT HEART LEFT (KITS) ×4 IMPLANT
PACK CARDIAC CATHETERIZATION (CUSTOM PROCEDURE TRAY) ×4 IMPLANT
SHEATH PINNACLE 6F 10CM (SHEATH) ×4 IMPLANT
SLED PULL BACK IVUS (MISCELLANEOUS) ×4 IMPLANT
TRANSDUCER W/STOPCOCK (MISCELLANEOUS) ×4 IMPLANT
WIRE COUGAR XT STRL 190CM (WIRE) ×4 IMPLANT
WIRE EMERALD 3MM-J .035X150CM (WIRE) ×4 IMPLANT

## 2015-11-16 NOTE — Progress Notes (Addendum)
Patient states he feels short of breath. Respiratory rate 20, regular, labored. Dr. Jacinto Halim paged. 0.9NS decreased from 50cc/hr to 10cc/hr. PRBC decreased from 120cc/hr to 75cc/hr. Ordered to give lasix now

## 2015-11-16 NOTE — Consult Note (Signed)
PULMONARY / CRITICAL CARE MEDICINE   Name: [name] MRN: [id number] DOB: 07-22-1930    ADMISSION DATE:  11/16/2015 CONSULTATION DATE:  1/18  REFERRING MD:  Dr. Jacinto Halim  CHIEF COMPLAINT:  SOB  HISTORY OF PRESENT ILLNESS:  82 year old male with PMH CKD stage 4-5, COPD, and DM. He was admitted 08/2015 for NSTEMI, septic shock secondary to PNA. He was on vent and required dialysis. He was made comfort care and extubated, but subsequently recovered and was discharged to home. Did not need chronic dialysis. He has been feeling sick x3 days with primary complaint of constipation, which remained stable until day of admission. 1/17 PM he began to develop chest tightness, which got worse 1/18 AM prompting him to call EMS. In the emergency department he was found to be tachycardic, had diffuse ST segment depression in the inferior and lateral leads, and troponin was markedly elevated. AVR lead on the EKG also showed ST elevation. Dr Jacinto Halim felt symptoms were more consistent with demand ischemia due to anemia and metabolic issues. He underwent emergent cardiac catheterization under Dr. Jacinto Halim.  No obstructive lesion was found and no intervention was performed. Post-cath he was transfused PRBC for Hgb 6.3, 45 mins after inititiation of blood transfusion he developed respiratory distress. He was started on NTG drip, given 80 mg Lasix, and PCCM was called for further eval.     PAST MEDICAL HISTORY :  He  has a past medical history of Diabetes mellitus; Hypertension; Hypercholesteremia; Gout; PVD (peripheral vascular disease) (HCC) (7/05); CAD (coronary artery disease) (2002); Macular degeneration; Chronic renal disease, stage III; Claudication (HCC); Palpitations; Bilateral carotid artery disease (HCC); and Anemia.  PAST SURGICAL HISTORY: He  has past surgical history that includes 2D ECHOCARDIOGRAM (03/31/2008); Cardiovascular stress test (06/30/2010); Cerebral angiogram (04/03/2004); ABDOMINAL AORTOGRAM  (09/01/2007); PERIPHERAL VASCULAR ANGIOGRAM (05/07/2011); CAROTID DOPPLER (06/09/2012); Cardiac catheterization (10/17/2001); Carotid endarterectomy (Left, July 2005); lower extremity angiogram (Bilateral, 11/01/2014); abdominal angiogram (11/01/2014); Knee surgery; and Hernia repair.  Allergies  Allergen Reactions  . Labetalol Swelling    angioedema    No current facility-administered medications on file prior to encounter.   Current Outpatient Prescriptions on File Prior to Encounter  Medication Sig  . allopurinol (ZYLOPRIM) 100 MG tablet TAKE 1 TABLET (100 MG TOTAL) BY MOUTH AT BEDTIME.  Marland Kitchen amLODipine (NORVASC) 10 MG tablet Take 1 tablet (10 mg total) by mouth at bedtime.  Marland Kitchen aspirin EC 81 MG tablet Take 81 mg by mouth every evening.  Marland Kitchen atorvastatin (LIPITOR) 80 MG tablet Take 1 tablet (80 mg total) by mouth daily at 6 PM.  . calcitRIOL (ROCALTROL) 0.25 MCG capsule Take 0.25 mcg by mouth every morning.   . clopidogrel (PLAVIX) 75 MG tablet TAKE 1 TABLET BY MOUTH DAILY  . ferrous sulfate 325 (65 FE) MG tablet Take 325 mg by mouth 2 (two) times daily with a meal.  . furosemide (LASIX) 80 MG tablet Take 1 tablet (80 mg total) by mouth daily.  . hydrALAZINE (APRESOLINE) 50 MG tablet Take 1 tablet (50 mg total) by mouth 2 (two) times daily. (Patient taking differently: Take 50 mg by mouth 3 (three) times daily. )  . isosorbide mononitrate (IMDUR) 60 MG 24 hr tablet Take 1 tablet (60 mg total) by mouth daily.  . metoprolol succinate (TOPROL-XL) 50 MG 24 hr tablet Take 1 tablet (50 mg total) by mouth daily. Take with or immediately following a meal.  . Multiple Vitamins-Minerals (PRESERVISION AREDS PO) Take 2 tablets by mouth daily.  Marland Kitchen  pantoprazole (PROTONIX) 40 MG tablet Take 1 tablet (40 mg total) by mouth daily.  . polyethylene glycol (MIRALAX / GLYCOLAX) packet Take 17 g by mouth daily as needed for mild constipation.  . sitaGLIPtin (JANUVIA) 25 MG tablet Take 1 tablet (25 mg total) by mouth daily.   Marland Kitchen ACCU-CHEK FASTCLIX LANCETS MISC Use to check blood sugar 2 times per day dx code E11.9  . feeding supplement, RESOURCE BREEZE, (RESOURCE BREEZE) LIQD Take 1 Container by mouth 3 (three) times daily between meals. (Patient not taking: Reported on 11/16/2015)  . glucose blood (ACCU-CHEK AVIVA PLUS) test strip Use as instructed to check blood sugar 2 times per day dx code E11.9    FAMILY HISTORY:  His has no family status information on file.   SOCIAL HISTORY: He  reports that he has quit smoking. He has never used smokeless tobacco. He reports that he does not drink alcohol.  REVIEW OF SYSTEMS:   Bolds are positive  Constitutional: weight loss, gain, night sweats, Fevers, chills, fatigue .  HEENT: headaches, Sore throat, sneezing, nasal congestion, post nasal drip, Difficulty swallowing, Tooth/dental problems, visual complaints visual changes, ear ache CV:  chest pain, radiates: ,Orthopnea, PND, swelling in lower extremities, dizziness, palpitations, syncope.  GI  heartburn, indigestion, abdominal pain, nausea, vomiting, diarrhea, change in bowel habits, loss of appetite, bloody stools.  Resp: cough, productive: , hemoptysis, dyspnea, chest pain, pleuritic.  Skin: rash or itching or icterus GU: dysuria, change in color of urine, urgency or frequency. flank pain, hematuria  MS: joint pain or swelling. decreased range of motion  Psych: change in mood or affect. depression or anxiety.  Neuro: difficulty with speech, weakness, numbness, ataxia    SUBJECTIVE:    VITAL SIGNS: BP 104/53 mmHg  Pulse 56  Temp(Src) 98.5 F (36.9 C) (Rectal)  Resp 22  Ht  (1.676 m)  Wt 75.297 kg (166 lb)  BMI 26.81 kg/m2  SpO2 100%  HEMODYNAMICS:    VENTILATOR SETTINGS:    INTAKE / OUTPUT:    PHYSICAL EXAMINATION: General:  Elderly male in mild/mod resp distress on NRB Neuro:  Alert, oriented, non-focal.  HEENT:  Benedict/At, PERRL, no JVD noted Cardiovascular:  RRR, no MRG. No LE  edema Lungs:  Coarse expiratory wheeze, increased WOB Abdomen:  Soft, non tender, nondistended Musculoskeletal:  No acute deformity or ROM limitation Skin:  Grossly intact  LABS:  BMET  Recent Labs Lab 11/16/15 1019  NA 139  K 2.6*  CL 95*  CO2 26  BUN 114*  CREATININE 3.87*  GLUCOSE 163*    Electrolytes  Recent Labs Lab 11/16/15 1019 11/16/15 1335  CALCIUM 8.8*  --   MG 2.6* 2.6*    CBC  Recent Labs Lab 11/16/15 1019  WBC 12.1*  HGB 6.2*  HCT 18.5*  PLT 160    Coag's  Recent Labs Lab 11/16/15 1019  APTT 23*  INR 1.13    Sepsis Markers No results for input(s): LATICACIDVEN, PROCALCITON, O2SATVEN in the last 168 hours.  ABG No results for input(s): PHART, PCO2ART, PO2ART in the last 168 hours.  Liver Enzymes  Recent Labs Lab 11/16/15 1019  AST 51*  ALT 19  ALKPHOS 79  BILITOT 1.0  ALBUMIN 2.8*    Cardiac Enzymes  Recent Labs Lab 11/16/15 1335  TROPONINI 8.35*    Glucose  Recent Labs Lab 11/16/15 1220  GLUCAP 164*    Imaging Dg Chest Portable 1 View  11/16/2015  CLINICAL DATA:  Shortness of breath and  confusion EXAM: PORTABLE CHEST 1 VIEW COMPARISON:  September 21, 2015 FINDINGS: There is moderate generalized interstitial edema and patchy alveolar edema in the right base. Lungs elsewhere clear. Heart is upper normal in size with pulmonary vascular within normal limits. No adenopathy. No pneumothorax. No bone lesions. IMPRESSION: Evidence of a degree of congestive heart failure. Probable alveolar edema right lower lobe, although a small focus of superimposed pneumonia cannot be excluded radiographically. Electronically Signed   By: Bretta Bang III M.D.   On: 11/16/2015 10:43     STUDIES:  1/18 LHC >Diffuse coronary calcification, there was damping of the pressures across the left main coronary artery and high-grade lesion was suspected. IVUS performed revealing no hemodynamically significant stenosis. Mild scattered  disease throughout the left and right coronary arteries. LVEF appears to be about 45%. 60mL contrast used. 1/18 Echo > LVEF 50-55%. Grade 2 DD. Ventricular systolic pressure was increased consistent with severe pulmonary hypertension.  CULTURES: none  ANTIBIOTICS: none  SIGNIFICANT EVENTS: 1/18 admit for CP, LHC, no intervention  LINES/TUBES:   DISCUSSION: 82 year old male with cardiac history, CKD, and uncontrolled. Recently admitted for septic shock, STEMI, and AonC renal failure requiring dialysis (now off). Presented as code STEMI 1/18 and was taken emergently to cath lab. No obstructive lesions to stent. Post cath hgb found to be 6.3, 2 unites PRBC ordered. 45 mins into first unit he developed dyspnea. Looking better on BiPAP  ASSESSMENT / PLAN:  PULMONARY A: Acute hypoxemic respiratory failure secondary to pulmonary edema Concern TRALI, TACO COPD per family, without acute exacerbation. (no meds on list would support this dx)  P:   BiPAP STAT CXR Pulmonary hygeine KVO IVF PRN albuterol  CARDIOVASCULAR A:  STEMI s/p cath lab > non-obstructive CAD. Acute on chronic diastolic CHF Elevated troponin > thought to be secondary to demand ischemia in setting anemia H/o HTN PVD  P:  Cardiology primary Trend troponin Nitroglycerine gtt On home atorvastatin, furosemide, metoprolol, imdur  RENAL A:   CKD 4/5 not on HD Hypokalemia  P:   Repleted on admission Repeat BMP now KVO IVF BMP in AM May need HD this admission  GASTROINTESTINAL A:   No acute issues  P:   NPO on BiPAP Protonix for SUP  HEMATOLOGIC A:   Pernicious anemia  P:  Reduce transfusion to only 1unit PRBC CBC every 6 hours x 3 Sq heparin for VTE ppx SCDs Check b12, folic acid  INFECTIOUS A:   No acute issues  P:   Follow WBC and fever curve  ENDOCRINE A:   DM    P:   CBG monitoring and SSI Holding home [place]  NEUROLOGIC A:   No acute issues  P:   RASS goal:  0 Monitor   FAMILY  - Updates:   - Inter-disciplinary family meet or Palliative Care meeting due by:  1/25   Joneen Roach, AGACNP-BC [facility] Pager [phone] or [phone]  11/16/2015 6:23 PM

## 2015-11-16 NOTE — H&P (Addendum)
[name] is an 82 y.o. male.   Chief Complaint: Chest tightness HPI: [name]  is a 82 y.o. male   Patient with known end-stage/stage 4-5 chronic kidney disease presently not on hemodialysis, history of chronic anemia, history of peripheral arterial disease with left iliac artery stenting following which he developed acute renal insufficiency leading to chronic kidney disease in January 2016. He has chronic leg edema, uncontrolled diabetes mellitus, hypertension, COPD and history of pernicious anemia. Admitted via emergency room when he presented with chest tightness that started last night. On presentation to the emergency room, he was found to be tachycardic, had diffuse ST segment depression in the inferior and lateral leads, troponin was markedly elevated. AVR lead on the EKG also showed ST elevation. Hence left main disease was suspected and STEMI was activated, patient was dressed to the emergency cardiac catheterization. He continued to have persistent chest tightness in the Lab. Denied any fever, chills was noted last night by the family members. Temperature has been normal in the hospital.  No PND or orthopnea. Has felt generally weak. Denies GI bleed, denies dark stools or bloody stools.  Past Medical History  Diagnosis Date  . Diabetes mellitus   . Hypertension   . Hypercholesteremia   . Gout   . PVD (peripheral vascular disease) ([place]) 7/05    LCE, known 60% RICA 7/12  . CAD (coronary artery disease) 2002    non critical  . Macular degeneration   . Chronic renal disease, stage III   . Claudication ([place])   . Palpitations     PVCs and PSVT on Holter monitoring  . Bilateral carotid artery disease ([facility])     status post left carotid endarterectomy performed by Dr. Amedeo Plenty 05/12/04  . Anemia     Past Surgical History  Procedure Laterality Date  . 2d echocardiogram  03/31/2008    EF greater than 55%  . Cardiovascular stress test  06/30/2010    Nonischemic. Low risk  . Cerebral  angiogram  04/03/2004    High-grade 80% ostial L ICA stenosis. Medical management.  . Abdominal aortogram  09/01/2007    Widely patent renal arteries. Medical management.  . Peripheral vascular angiogram  05/07/2011    No evidence of intracranial occlusions, stenosis, dissections, or aneurysms seen  . Carotid doppler  06/09/2012    R Vertebral-known occluded vessel, R Bulb/Proximal ICA-moderate to severe amount of plaque w/50-69% diameter reduction, L Subclavian-50-69% diameter reduction, L CEA-demonstrated increased velocities w/o evidence of hemodynamically significant stenosis  . Cardiac catheterization  10/17/2001    No significant CAD, normal LV systolic function.  . Carotid endarterectomy Left July 2005  . Lower extremity angiogram Bilateral 11/01/2014    Procedure: LOWER EXTREMITY ANGIOGRAM;  Surgeon: Lorretta Harp, MD;  Location: [facility] CATH LAB;  Service: Cardiovascular;  Laterality: Bilateral;  . Abdominal angiogram  11/01/2014    Procedure: ABDOMINAL ANGIOGRAM;  Surgeon: Lorretta Harp, MD;  Location: [facility] CATH LAB;  Service: Cardiovascular;;  . Knee surgery    . Hernia repair      Family History  Problem Relation Age of Onset  . Stroke Sister   . Heart disease Brother   . Diabetes Neg Hx    Social History:  reports that he has quit smoking. He has never used smokeless tobacco. He reports that he does not drink alcohol. His drug history is not on file.  Allergies:  Allergies  Allergen Reactions  . Labetalol Swelling    angioedema  Review of Systems - Complains of generalized weakness, shortness of breath mild, that is chronic but unchanged. No fever, had noticed some chills last evening. Has chronic leg edema. Other systems negative.  Blood pressure 159/52, pulse 147, temperature 98.1 F (36.7 C), temperature source Rectal, resp. rate 16, height _0  (1.676 m), weight 75.297 kg (166 lb), SpO2 94 %. General appearance: alert, cooperative, appears older than stated age, mild  distress and appears ill Eyes: negative findings: lids and lashes normal Neck: no JVD and supple, symmetrical, trachea midline Neck: JVP - normal, carotids 2+= without bruits Resp: clear to auscultation bilaterally Chest wall: no tenderness Cardio: distant heart sounds GI: soft, non-tender; bowel sounds normal; no masses,  no organomegaly Extremities: extremities normal, atraumatic, no cyanosis or edema Pulses: feeble pedal pulse Skin: Skin color, texture, turgor normal. No rashes or lesions Neurologic: Grossly normal  Results for orders placed or performed during the hospital encounter of 11/16/15 (from the past 48 hour(s))  Brain natriuretic peptide     Status: Abnormal   Collection Time: 11/16/15 10:15 AM  Result Value Ref Range   B Natriuretic Peptide 2120.5 (H) 0.0 - 100.0 pg/mL  APTT     Status: Abnormal   Collection Time: 11/16/15 10:19 AM  Result Value Ref Range   aPTT 23 (L) 24 - 37 seconds  CBC     Status: Abnormal   Collection Time: 11/16/15 10:19 AM  Result Value Ref Range   WBC 12.1 (H) 4.0 - 10.5 K/uL   RBC 2.00 (L) 4.22 - 5.81 MIL/uL   Hemoglobin 6.2 (LL) 13.0 - 17.0 g/dL    Comment: CRITICAL RESULT CALLED TO, READ BACK BY AND VERIFIED WITH: J.WATTS,RN 11/16/15 _1  BY V.WILKINS    HCT 18.5 (L) 39.0 - 52.0 %   MCV 92.5 78.0 - 100.0 fL   MCH 31.0 26.0 - 34.0 pg   MCHC 33.5 30.0 - 36.0 g/dL   RDW 15.9 (H) 11.5 - 15.5 %   Platelets 160 150 - 400 K/uL  Comprehensive metabolic panel     Status: Abnormal   Collection Time: 11/16/15 10:19 AM  Result Value Ref Range   Sodium 139 135 - 145 mmol/L   Potassium 2.6 (LL) 3.5 - 5.1 mmol/L    Comment: CRITICAL RESULT CALLED TO, READ BACK BY AND VERIFIED WITH: F ZILLAH,RN AT 1116 ON 1.18.17 BY W JOHNSON    Chloride 95 (L) 101 - 111 mmol/L   CO2 26 22 - 32 mmol/L   Glucose, Bld 163 (H) 65 - 99 mg/dL   BUN 114 (H) 6 - 20 mg/dL   Creatinine, Ser 3.87 (H) 0.61 - 1.24 mg/dL   Calcium 8.8 (L) 8.9 - 10.3 mg/dL   Total  Protein 5.1 (L) 6.5 - 8.1 g/dL   Albumin 2.8 (L) 3.5 - 5.0 g/dL   AST 51 (H) 15 - 41 U/L   ALT 19 17 - 63 U/L   Alkaline Phosphatase 79 38 - 126 U/L   Total Bilirubin 1.0 0.3 - 1.2 mg/dL   GFR calc non Af Amer 13 (L) >60 mL/min   GFR calc Af Amer 15 (L) >60 mL/min    Comment: (NOTE) The eGFR has been calculated using the CKD EPI equation. This calculation has not been validated in all clinical situations. eGFR's persistently <60 mL/min signify possible Chronic Kidney Disease.    Anion gap 18 (H) 5 - 15  Protime-INR     Status: None   Collection Time: 11/16/15 10:19 AM  Result Value Ref Range   Prothrombin Time 14.7 11.6 - 15.2 seconds   INR 1.13 0.00 - 1.49  Magnesium     Status: Abnormal   Collection Time: 11/16/15 10:19 AM  Result Value Ref Range   Magnesium 2.6 (H) 1.7 - 2.4 mg/dL  I-Stat Troponin, ED (not at [facility], [facility])     Status: Abnormal   Collection Time: 11/16/15 10:27 AM  Result Value Ref Range   Troponin i, poc 8.82 (HH) 0.00 - 0.08 ng/mL   Comment NOTIFIED PHYSICIAN    Comment 3            Comment: Due to the release kinetics of cTnI, a negative result within the first hours of the onset of symptoms does not rule out myocardial infarction with certainty. If myocardial infarction is still suspected, repeat the test at appropriate intervals.    Dg Chest Portable 1 View  11/16/2015  CLINICAL DATA:  Shortness of breath and confusion EXAM: PORTABLE CHEST 1 VIEW COMPARISON:  September 21, 2015 FINDINGS: There is moderate generalized interstitial edema and patchy alveolar edema in the right base. Lungs elsewhere clear. Heart is upper normal in size with pulmonary vascular within normal limits. No adenopathy. No pneumothorax. No bone lesions. IMPRESSION: Evidence of a degree of congestive heart failure. Probable alveolar edema right lower lobe, although a small focus of superimposed pneumonia cannot be excluded radiographically. Electronically Signed   By: Lowella Grip  III M.D.   On: 11/16/2015 10:43    Labs:   Lab Results  Component Value Date   WBC 12.1* 11/16/2015   HGB 6.2* 11/16/2015   HCT 18.5* 11/16/2015   MCV 92.5 11/16/2015   PLT 160 11/16/2015    Recent Labs Lab 11/16/15 1019  NA 139  K 2.6*  CL 95*  CO2 26  BUN 114*  CREATININE 3.87*  CALCIUM 8.8*  PROT 5.1*  BILITOT 1.0  ALKPHOS 79  ALT 19  AST 51*  GLUCOSE 163*    Lipid Panel     Component Value Date/Time   CHOL 110 09/23/2015 0403   TRIG 105 09/23/2015 0403   HDL 41 09/23/2015 0403   CHOLHDL 2.7 09/23/2015 0403   VLDL 21 09/23/2015 0403   LDLCALC 48 09/23/2015 0403    BNP (last 3 results)  Recent Labs  09/11/15 0955 11/16/15 1015  BNP 2200.4* 2120.5*    HEMOGLOBIN A1C Lab Results  Component Value Date   HGBA1C 6.1* 09/22/2015   MPG 128 09/22/2015    Cardiac Panel (last 3 results)  Recent Labs  09/11/15 2020 09/12/15 0235 09/13/15 1115  TROPONINI 11.37* 18.51* 11.65*    Lab Results  Component Value Date   TROPONINI 11.65* 09/13/2015     TSH No results for input(s): TSH in the last 8760 hours.  EKG: 11/16/2015: Normal sinus rhythm at a rate of 91 bpm, frequent PACs, diffuse ST segment depression in inferior and lateral leads with ST elevation in aVR, cannot exclude global ischemia. LVH. ST changes are new compared to prior EKG.  Medications Prior to Admission  Medication Sig Dispense Refill  . allopurinol (ZYLOPRIM) 100 MG tablet TAKE 1 TABLET (100 MG TOTAL) BY MOUTH AT BEDTIME. 30 tablet 2  . amLODipine (NORVASC) 10 MG tablet Take 1 tablet (10 mg total) by mouth at bedtime. 10 tablet 1  . aspirin EC 81 MG tablet Take 81 mg by mouth every evening.    Marland Kitchen atorvastatin (LIPITOR) 80 MG tablet Take 1 tablet (80 mg total) by  mouth daily at 6 PM. 30 tablet 1  . calcitRIOL (ROCALTROL) 0.25 MCG capsule Take 0.25 mcg by mouth every morning.     . clopidogrel (PLAVIX) 75 MG tablet TAKE 1 TABLET BY MOUTH DAILY 30 tablet 10  . ferrous sulfate 325  (65 FE) MG tablet Take 325 mg by mouth 2 (two) times daily with a meal.    . furosemide (LASIX) 80 MG tablet Take 1 tablet (80 mg total) by mouth daily. 30 tablet 1  . hydrALAZINE (APRESOLINE) 50 MG tablet Take 1 tablet (50 mg total) by mouth 2 (two) times daily. (Patient taking differently: Take 50 mg by mouth 3 (three) times daily. ) 60 tablet 1  . isosorbide mononitrate (IMDUR) 60 MG 24 hr tablet Take 1 tablet (60 mg total) by mouth daily. 30 tablet 1  . metoprolol succinate (TOPROL-XL) 50 MG 24 hr tablet Take 1 tablet (50 mg total) by mouth daily. Take with or immediately following a meal. 30 tablet 1  . Multiple Vitamins-Minerals (PRESERVISION AREDS PO) Take 2 tablets by mouth daily.    . pantoprazole (PROTONIX) 40 MG tablet Take 1 tablet (40 mg total) by mouth daily. 30 tablet 0  . polyethylene glycol (MIRALAX / GLYCOLAX) packet Take 17 g by mouth daily as needed for mild constipation. 14 each 0  . sitaGLIPtin (JANUVIA) 25 MG tablet Take 1 tablet (25 mg total) by mouth daily. 30 tablet 2  . ACCU-CHEK FASTCLIX LANCETS MISC Use to check blood sugar 2 times per day dx code E11.9 102 each 2  . feeding supplement, RESOURCE BREEZE, (RESOURCE BREEZE) LIQD Take 1 Container by mouth 3 (three) times daily between meals. (Patient not taking: Reported on 11/16/2015) 30 Container 0  . glucose blood (ACCU-CHEK AVIVA PLUS) test strip Use as instructed to check blood sugar 2 times per day dx code E11.9 100 each 3      Current facility-administered medications:  .  0.9 %  sodium chloride infusion, , Intravenous, Continuous, Gareth Morgan, MD, Last Rate: 20 mL/hr at 11/16/15 1047 .  0.9 %  sodium chloride infusion, , Intravenous, Once, Gareth Morgan, MD .  bivalirudin (ANGIOMAX) 250 mg in sodium chloride 0.9 % 50 mL (5 mg/mL) infusion, 250 mg, , Continuous PRN, Adrian Prows, MD, Stopped at 11/16/15 1145 .  bivalirudin (ANGIOMAX) BOLUS via infusion, , , PRN, Adrian Prows, MD, 56.475 mg at 11/16/15 1131 .   heparin ADULT infusion 100 units/mL (25000 units/250 mL), 900 Units/hr, Intravenous, Continuous, Romona Curls, [facility], Last Rate: 9 mL/hr at 11/16/15 1049, 900 Units/hr at 11/16/15 1049 .  [MAR Hold] nitroGLYCERIN (NITROSTAT) SL tablet 0.4 mg, 0.4 mg, Sublingual, Q5 min PRN, Gareth Morgan, MD, 0.4 mg at 11/16/15 1052  Assessment/Plan 1. Acute coronary syndrome, NSTEMI, markedly elevated serum troponin and markedly abnormal EKG suggestive of multivessel disease or left main disease. Patient has ongoing chest discomfort. Patient also has history of NSTEMI in November 2016 when he presented with pneumonia and was medically managed. 2. Stage IV- V chronic kidney disease 3. Chronic anemia recent drop in the past 1 month, no history to suggest GI bleed or any obvious bleed. 4. Diabetes mellitus type 2 uncontrolled  Recommendation: Patient taken emergently to coronary angiography suite. Further conditions will follow.  Addendum: Coronary angiography revealed no significant high-grade lesion. LVEF appears to be about 45%. 70 mL of contrast was utilized for angiography.  Suspect his presentation is related to demand ischemia from severe anemia, metabolic issues and underlying medical comorbidities. I  discussed the findings with the patient's family, daughter and son, patient has not been taking Procrit shots due to financial issues.  He has not had any obvious GI bleed.  I have also consulted Dr. Jeneen Rinks Deterding to help me in the management of this patient.  Will transfuse 2 units of packed cells. Per Nephrology, I will reduce the dose of IV fluids, due to history of diastolic heart failure in the past.  We'll obtain an echocardiogram.  Do not suspect pulmonary embolism.   Adrian Prows, MD 11/16/2015, 12:02 PM [facility] [place] Pager: [phone] Office: [phone] If no answer: Cell:  [phone]

## 2015-11-16 NOTE — Progress Notes (Signed)
Site area: rt groin fa sheath Site Prior to Removal:  Level  0 Pressure Applied For:  20 minutes Manual:   yes Patient Status During Pull:  stable Post Pull Site:  Level  0 Post Pull Instructions Given:  yes Post Pull Pulses Present: yes Dressing Applied:  tegaderm Bedrest begins @  1430 Comments:

## 2015-11-16 NOTE — Consult Note (Signed)
Reason for Consult:CKD4-5  Referring Physician: Dr. Beverlyn Roux is an 82 y.o. male.  HPI: 82 yr male patient of Dr. Serita Grammes, with hx CKD4-5, nephrotic syndrome (either membranous or other secondary dz, not eval due to adherence and comorbidities), severe HTN, Type 2 DM, PVD, memory deficits, carotid dz, anemia, HPTH, gout, and recurrent hypervolemia.  Brought to ED this am with chest tightness since last pm, Had diffuse ST ^, so taken to cath lab and had normal coronaries.  Hb only 6.2, and has not been going for his esa.  K of 2.9.  , Cr 3.87/BUN 118.  He does not know why he is here, but  Sedated. Know well to me and recent of addition of Metalazone 14m MWF to Lasix of 1650mtid. Wgt on had gone down 9 lb from 1/5 to 1/13 .  Breathing had been better.  Had been set up to get perm access.  Hosp in early 12.16 with pneu, MI, resp failure, AKI.  Entub x 2, CRRT. Care had been withdrawn but he got better and was d/c initially to NH then home. Review of systems not obtained due to patient factors.   . Primary Nephrologist Shawntae Lowy. EDW 155 lb.   Past Medical History  Diagnosis Date  . Diabetes mellitus   . Hypertension   . Hypercholesteremia   . Gout   . PVD (peripheral vascular disease) ([facility]7/05    LCE, known 60% RICA 7/12  . CAD (coronary artery disease) 2002    non critical  . Macular degeneration   . Chronic renal disease, stage III   . Claudication ([facility]  . Palpitations     PVCs and PSVT on Holter monitoring  . Bilateral carotid artery disease (HCC)     status post left carotid endarterectomy performed by Dr. HaAmedeo Plenty/15/05  . Anemia     Past Surgical History  Procedure Laterality Date  . 2d echocardiogram  03/31/2008    EF greater than 55%  . Cardiovascular stress test  06/30/2010    Nonischemic. Low risk  . Cerebral angiogram  04/03/2004    High-grade 80% ostial L ICA stenosis. Medical management.  . Abdominal aortogram  09/01/2007    Widely patent renal  arteries. Medical management.  . Peripheral vascular angiogram  05/07/2011    No evidence of intracranial occlusions, stenosis, dissections, or aneurysms seen  . Carotid doppler  06/09/2012    R Vertebral-known occluded vessel, R Bulb/Proximal ICA-moderate to severe amount of plaque w/50-69% diameter reduction, L Subclavian-50-69% diameter reduction, L CEA-demonstrated increased velocities w/o evidence of hemodynamically significant stenosis  . Cardiac catheterization  10/17/2001    No significant CAD, normal LV systolic function.  . Carotid endarterectomy Left July 2005  . Lower extremity angiogram Bilateral 11/01/2014    Procedure: LOWER EXTREMITY ANGIOGRAM;  Surgeon: JoLorretta HarpMD;  [facility];  Service: Cardiovascular;  Laterality: Bilateral;  . Abdominal angiogram  11/01/2014    Procedure: ABDOMINAL ANGIOGRAM;  Surgeon: JoLorretta HarpMD;  [facility];  Service: Cardiovascular;;  . Knee surgery    . Hernia repair      Family History  Problem Relation Age of Onset  . Stroke Sister   . Heart disease Brother   . Diabetes Neg Hx     Social History:  reports that he has quit smoking. He has never used smokeless tobacco. He reports that he does not drink alcohol. His drug history is not on  file.  Allergies:  Allergies  Allergen Reactions  . Labetalol Swelling    angioedema    Medications:  I have reviewed the patient's current medications. Prior to Admission:  Prescriptions prior to admission  Medication Sig Dispense Refill Last Dose  . allopurinol (ZYLOPRIM) 100 MG tablet TAKE 1 TABLET (100 MG TOTAL) BY MOUTH AT BEDTIME. 30 tablet 2 11/15/2015 at Unknown time  . amLODipine (NORVASC) 10 MG tablet Take 1 tablet (10 mg total) by mouth at bedtime. 10 tablet 1 11/15/2015 at Unknown time  . aspirin EC 81 MG tablet Take 81 mg by mouth every evening.   11/15/2015 at Unknown time  . atorvastatin (LIPITOR) 80 MG tablet Take 1 tablet (80 mg total) by mouth daily at 6  PM. 30 tablet 1 11/15/2015 at Unknown time  . calcitRIOL (ROCALTROL) 0.25 MCG capsule Take 0.25 mcg by mouth every morning.    11/15/2015 at Unknown time  . clopidogrel (PLAVIX) 75 MG tablet TAKE 1 TABLET BY MOUTH DAILY 30 tablet 10 11/15/2015 at Unknown time  . ferrous sulfate 325 (65 FE) MG tablet Take 325 mg by mouth 2 (two) times daily with a meal.   11/15/2015 at Unknown time  . furosemide (LASIX) 80 MG tablet Take 1 tablet (80 mg total) by mouth daily. 30 tablet 1 11/15/2015 at Unknown time  . hydrALAZINE (APRESOLINE) 50 MG tablet Take 1 tablet (50 mg total) by mouth 2 (two) times daily. (Patient taking differently: Take 50 mg by mouth 3 (three) times daily. ) 60 tablet 1 11/15/2015 at Unknown time  . isosorbide mononitrate (IMDUR) 60 MG 24 hr tablet Take 1 tablet (60 mg total) by mouth daily. 30 tablet 1 11/15/2015 at Unknown time  . metoprolol succinate (TOPROL-XL) 50 MG 24 hr tablet Take 1 tablet (50 mg total) by mouth daily. Take with or immediately following a meal. 30 tablet 1 11/15/2015 at Unknown time  . Multiple Vitamins-Minerals (PRESERVISION AREDS PO) Take 2 tablets by mouth daily.   11/15/2015 at Unknown time  . pantoprazole (PROTONIX) 40 MG tablet Take 1 tablet (40 mg total) by mouth daily. 30 tablet 0 11/15/2015 at Unknown time  . polyethylene glycol (MIRALAX / GLYCOLAX) packet Take 17 g by mouth daily as needed for mild constipation. 14 each 0 Unk  . sitaGLIPtin (JANUVIA) 25 MG tablet Take 1 tablet (25 mg total) by mouth daily. 30 tablet 2 11/15/2015 at Unknown time  . ACCU-CHEK FASTCLIX LANCETS MISC Use to check blood sugar 2 times per day dx code E11.9 102 each 2 Taking  . feeding supplement, RESOURCE BREEZE, (RESOURCE BREEZE) LIQD Take 1 Container by mouth 3 (three) times daily between meals. (Patient not taking: Reported on 11/16/2015) 30 Container 0 Not Taking at Unknown time  . glucose blood (ACCU-CHEK AVIVA PLUS) test strip Use as instructed to check blood sugar 2 times per day dx code  E11.9 100 each 3 Taking    Calcitriol .24mg alt with .75 mcg qd.   Results for orders placed or performed during the hospital encounter of 11/16/15 (from the past 48 hour(s))  Brain natriuretic peptide     Status: Abnormal   Collection Time: 11/16/15 10:15 AM  Result Value Ref Range   B Natriuretic Peptide 2120.5 (H) 0.0 - 100.0 pg/mL  APTT     Status: Abnormal   Collection Time: 11/16/15 10:19 AM  Result Value Ref Range   aPTT 23 (L) 24 - 37 seconds  CBC     Status: Abnormal  Collection Time: 11/16/15 10:19 AM  Result Value Ref Range   WBC 12.1 (H) 4.0 - 10.5 K/uL   RBC 2.00 (L) 4.22 - 5.81 MIL/uL   Hemoglobin 6.2 (LL) 13.0 - 17.0 g/dL    Comment: CRITICAL RESULT CALLED TO, READ BACK BY AND VERIFIED WITH: J.WATTS,RN 11/16/15 @1050  BY V.WILKINS    HCT 18.5 (L) 39.0 - 52.0 %   MCV 92.5 78.0 - 100.0 fL   MCH 31.0 26.0 - 34.0 pg   MCHC 33.5 30.0 - 36.0 g/dL   RDW 15.9 (H) 11.5 - 15.5 %   Platelets 160 150 - 400 K/uL  Comprehensive metabolic panel     Status: Abnormal   Collection Time: 11/16/15 10:19 AM  Result Value Ref Range   Sodium 139 135 - 145 mmol/L   Potassium 2.6 (LL) 3.5 - 5.1 mmol/L    Comment: CRITICAL RESULT CALLED TO, READ BACK BY AND VERIFIED WITH: F ZILLAH,RN AT 1116 ON 1.18.17 BY W JOHNSON    Chloride 95 (L) 101 - 111 mmol/L   CO2 26 22 - 32 mmol/L   Glucose, Bld 163 (H) 65 - 99 mg/dL   BUN 114 (H) 6 - 20 mg/dL   Creatinine, Ser 3.87 (H) 0.61 - 1.24 mg/dL   Calcium 8.8 (L) 8.9 - 10.3 mg/dL   Total Protein 5.1 (L) 6.5 - 8.1 g/dL   Albumin 2.8 (L) 3.5 - 5.0 g/dL   AST 51 (H) 15 - 41 U/L   ALT 19 17 - 63 U/L   Alkaline Phosphatase 79 38 - 126 U/L   Total Bilirubin 1.0 0.3 - 1.2 mg/dL   GFR calc non Af Amer 13 (L) >60 mL/min   GFR calc Af Amer 15 (L) >60 mL/min    Comment: (NOTE) The eGFR has been calculated using the CKD EPI equation. This calculation has not been validated in all clinical situations. eGFR's persistently <60 mL/min signify possible  Chronic Kidney Disease.    Anion gap 18 (H) 5 - 15  Protime-INR     Status: None   Collection Time: 11/16/15 10:19 AM  Result Value Ref Range   Prothrombin Time 14.7 11.6 - 15.2 seconds   INR 1.13 0.00 - 1.49  Magnesium     Status: Abnormal   Collection Time: 11/16/15 10:19 AM  Result Value Ref Range   Magnesium 2.6 (H) 1.7 - 2.4 mg/dL  I-Stat Troponin, ED (not at [facility])     Status: Abnormal   Collection Time: 11/16/15 10:27 AM  Result Value Ref Range   Troponin i, poc 8.82 (HH) 0.00 - 0.08 ng/mL   Comment NOTIFIED PHYSICIAN    Comment 3            Comment: Due to the release kinetics of cTnI, a negative result within the first hours of the onset of symptoms does not rule out myocardial infarction with certainty. If myocardial infarction is still suspected, repeat the test at appropriate intervals.   Glucose, capillary     Status: Abnormal   Collection Time: 11/16/15 12:20 PM  Result Value Ref Range   Glucose-Capillary 164 (H) 65 - 99 mg/dL  Type and screen [place]     Status: None (Preliminary result)   Collection Time: 11/16/15  1:35 PM  Result Value Ref Range   ABO/RH(D) B POS    Antibody Screen PENDING    Sample Expiration 11/19/2015   Prepare RBC     Status: None   Collection Time: 11/16/15  1:35 PM  Result Value Ref Range   Order Confirmation ORDER PROCESSED BY BLOOD BANK     Dg Chest Portable 1 View  11/16/2015  CLINICAL DATA:  Shortness of breath and confusion EXAM: PORTABLE CHEST 1 VIEW COMPARISON:  September 21, 2015 FINDINGS: There is moderate generalized interstitial edema and patchy alveolar edema in the right base. Lungs elsewhere clear. Heart is upper normal in size with pulmonary vascular within normal limits. No adenopathy. No pneumothorax. No bone lesions. IMPRESSION: Evidence of a degree of congestive heart failure. Probable alveolar edema right lower lobe, although a small focus of superimposed pneumonia cannot be excluded  radiographically. Electronically Signed   By: Lowella Grip III M.D.   On: 11/16/2015 10:43    ROS Blood pressure 171/44, pulse 91, temperature 98.1 F (36.7 C), temperature source Rectal, resp. rate 18, height 5' 6"  (1.676 m), weight 75.297 kg (166 lb), SpO2 91 %. Physical Exam Physical Examination: General appearance - slowed mentation Ox2 Mental status - as above, coop but confused Eyes - chronic changes of HTN Mouth - mucous membranes moist, pharynx normal without lesions Neck - adenopathy noted PCL Lymphatics - posterior cervical nodes Chest - decreased air entry noted bilat Heart - S1 and S2 normal, S4 present, systolic murmur Gr 2/6 at 2nd left intercostal space and Gr 2/6 dia M at ULSB and Gr 2/6 holosys M at apex Abdomen - soft,, liver down 4 cm, nontender.   Extremities - 2+ edema, bilat fem bruits, abdm bruits,   Skin - trophic changes distally,   Assessment/Plan: 1 CKD 4-5 vol improved over 12.26 but still xs.  High risk for dye toxicity.  Needs K. Will gradually reintroduce meds.  He has made decision in past to dialyze, will review if needed..  Will limit vol 2 CP : anemia related 3 Hypertension: vol and vasc 4. Anemia give esa, check Fe 5. Metabolic Bone Disease: check PTH 6 PVD 7 confusion P meds, low vol ivf, TX, check labs,  Counsel.    Chares Slaymaker L 11/16/2015, 2:15 PM

## 2015-11-16 NOTE — Procedures (Signed)
Pt placed on 5L [place] by RN. BiPAP on standby.  Pt is resting well without distress.  RT will continue to monitor pt and will place on pt if needed.

## 2015-11-16 NOTE — ED Notes (Signed)
zoll pads placed on pt 

## 2015-11-16 NOTE — Progress Notes (Signed)
Desating-o2 sat 82%. Placed on O2 mask. O2 sat up to 100%

## 2015-11-16 NOTE — Progress Notes (Addendum)
C/O 3/10 chest pain. NTG spray X 1. Dr. Jacinto Halim notified. Ordered to start NTG drip.Audible faint end expiratory wheezing. Bilateral breath sounds auscultated w/exp. wheezing.

## 2015-11-16 NOTE — Progress Notes (Signed)
  Echocardiogram 2D Echocardiogram has been performed.  Leta Jungling M 11/16/2015, 1:39 PM

## 2015-11-16 NOTE — ED Notes (Signed)
From home via [facility], presents today with c/o fever, weakness, was Afib - RVR pta, rate slower after NS bolus from EMS, A/O X4  CBG 209 BP 158/48

## 2015-11-16 NOTE — ED Provider Notes (Signed)
CSN: [id number]     Arrival date & time 11/16/15  0932 History   First MD Initiated Contact with Patient 11/16/15 [phone]     Chief Complaint  Patient presents with  . Fever  . Tachycardia     (Consider location/radiation/quality/duration/timing/severity/associated sxs/prior Treatment) Patient is a 81 y.o. male presenting with chest pain.  Chest Pain Pain location:  L chest Pain quality: aching   Pain radiates to:  Does not radiate Pain radiates to the back: no   Pain severity:  Severe Onset quality:  Sudden Duration: began at 3AM. Timing:  Constant Progression:  Unchanged Chronicity:  New Relieved by:  Nothing Worsened by:  Nothing tried Ineffective treatments:  None tried Associated symptoms: shortness of breath   Associated symptoms: no abdominal pain, no back pain, no cough, no diaphoresis, no fever, no headache, no nausea, no syncope and not vomiting   Risk factors: coronary artery disease, high cholesterol and hypertension     Past Medical History  Diagnosis Date  . Diabetes mellitus   . Hypertension   . Hypercholesteremia   . Gout   . PVD (peripheral vascular disease) (HCC) 7/05    LCE, known 60% RICA 7/12  . CAD (coronary artery disease) 2002    non critical  . Macular degeneration   . Chronic renal disease, stage III   . Claudication (HCC)   . Palpitations     PVCs and PSVT on Holter monitoring  . Bilateral carotid artery disease (HCC)     status post left carotid endarterectomy performed by Dr. Madilyn Fireman 05/12/04  . Anemia    Past Surgical History  Procedure Laterality Date  . 2d echocardiogram  03/31/2008    EF greater than 55%  . Cardiovascular stress test  06/30/2010    Nonischemic. Low risk  . Cerebral angiogram  04/03/2004    High-grade 80% ostial L ICA stenosis. Medical management.  . Abdominal aortogram  09/01/2007    Widely patent renal arteries. Medical management.  . Peripheral vascular angiogram  05/07/2011    No evidence of intracranial  occlusions, stenosis, dissections, or aneurysms seen  . Carotid doppler  06/09/2012    R Vertebral-known occluded vessel, R Bulb/Proximal ICA-moderate to severe amount of plaque w/50-69% diameter reduction, L Subclavian-50-69% diameter reduction, L CEA-demonstrated increased velocities w/o evidence of hemodynamically significant stenosis  . Cardiac catheterization  10/17/2001    No significant CAD, normal LV systolic function.  . Carotid endarterectomy Left July 2005  . Lower extremity angiogram Bilateral 11/01/2014    Procedure: LOWER EXTREMITY ANGIOGRAM;  Surgeon: [name], MD;  Location: [facility] CATH LAB;  Service: Cardiovascular;  Laterality: Bilateral;  . Abdominal angiogram  11/01/2014    Procedure: ABDOMINAL ANGIOGRAM;  Surgeon: [name], MD;  Location: [facility] CATH LAB;  Service: Cardiovascular;;  . Knee surgery    . Hernia repair     Family History  Problem Relation Age of Onset  . Stroke Sister   . Heart disease Brother   . Diabetes Neg Hx    Social History  Substance Use Topics  . Smoking status: Former Smoker -- 1.00 packs/day for 40 years  . Smokeless tobacco: Never Used     Comment: quit approx. 20 years ago.  . Alcohol Use: No    Review of Systems  Constitutional: Positive for chills. Negative for fever and diaphoresis.  HENT: Negative for sore throat.   Eyes: Negative for visual disturbance.  Respiratory: Positive for shortness of breath. Negative for cough.  Cardiovascular: Positive for chest pain. Negative for syncope.  Gastrointestinal: Positive for constipation. Negative for nausea, vomiting, abdominal pain and diarrhea.  Genitourinary: Negative for dysuria and difficulty urinating.  Musculoskeletal: Negative for back pain and neck stiffness.  Skin: Negative for rash.  Neurological: Negative for syncope and headaches.      Allergies  Labetalol  Home Medications   Prior to Admission medications   Medication Sig Start Date End Date Taking?  Authorizing Provider  allopurinol (ZYLOPRIM) 100 MG tablet TAKE 1 TABLET (100 MG TOTAL) BY MOUTH AT BEDTIME. 01/27/15  Yes Rhonda G Barrett, PA-C  amLODipine (NORVASC) 10 MG tablet Take 1 tablet (10 mg total) by mouth at bedtime. 09/24/15  Yes Catarina Hartshorn, MD  aspirin EC 81 MG tablet Take 81 mg by mouth every evening.   Yes Historical Provider, MD  atorvastatin (LIPITOR) 80 MG tablet Take 1 tablet (80 mg total) by mouth daily at 6 PM. 09/23/15  Yes Catarina Hartshorn, MD  calcitRIOL (ROCALTROL) 0.25 MCG capsule Take 0.25 mcg by mouth every morning.  05/27/13  Yes Historical Provider, MD  clopidogrel (PLAVIX) 75 MG tablet TAKE 1 TABLET BY MOUTH DAILY 11/01/14  Yes [name], MD  ferrous sulfate 325 (65 FE) MG tablet Take 325 mg by mouth 2 (two) times daily with a meal.   Yes Historical Provider, MD  furosemide (LASIX) 80 MG tablet Take 1 tablet (80 mg total) by mouth daily. 09/24/15  Yes Catarina Hartshorn, MD  hydrALAZINE (APRESOLINE) 50 MG tablet Take 1 tablet (50 mg total) by mouth 2 (two) times daily. Patient taking differently: Take 50 mg by mouth 3 (three) times daily.  09/24/15  Yes Catarina Hartshorn, MD  isosorbide mononitrate (IMDUR) 60 MG 24 hr tablet Take 1 tablet (60 mg total) by mouth daily. 09/23/15  Yes Catarina Hartshorn, MD  metoprolol succinate (TOPROL-XL) 50 MG 24 hr tablet Take 1 tablet (50 mg total) by mouth daily. Take with or immediately following a meal. 09/24/15  Yes Catarina Hartshorn, MD  Multiple Vitamins-Minerals (PRESERVISION AREDS PO) Take 2 tablets by mouth daily.   Yes Historical Provider, MD  pantoprazole (PROTONIX) 40 MG tablet Take 1 tablet (40 mg total) by mouth daily. 03/29/15  Yes Belkys A Regalado, MD  polyethylene glycol (MIRALAX / GLYCOLAX) packet Take 17 g by mouth daily as needed for mild constipation. 09/24/15  Yes Catarina Hartshorn, MD  sitaGLIPtin (JANUVIA) 25 MG tablet Take 1 tablet (25 mg total) by mouth daily. 05/03/15  Yes Reather Littler, MD  ACCU-CHEK FASTCLIX LANCETS MISC Use to check blood sugar 2 times  per day dx code E11.9 07/20/15   Reather Littler, MD  feeding supplement, RESOURCE BREEZE, (RESOURCE BREEZE) LIQD Take 1 Container by mouth 3 (three) times daily between meals. Patient not taking: Reported on 11/16/2015 03/29/15   Belkys A Regalado, MD  glucose blood (ACCU-CHEK AVIVA PLUS) test strip Use as instructed to check blood sugar 2 times per day dx code E11.9 06/01/15   Reather Littler, MD   BP 104/77 mmHg  Pulse 66  Temp(Src) 97.8 F (36.6 C) (Rectal)  Resp 22  Ht 5\' 6"  (1.676 m)  Wt 166 lb (75.297 kg)  BMI 26.81 kg/m2  SpO2 99% Physical Exam  Constitutional: He is oriented to person, place, and time. He appears well-developed and well-nourished. He appears ill. No distress.  HENT:  Head: Normocephalic and atraumatic.  Eyes: Conjunctivae and EOM are normal.  Neck: Normal range of motion.  Cardiovascular: Normal rate, regular rhythm, normal  heart sounds and intact distal pulses.  Exam reveals no gallop and no friction rub.   No murmur heard. Pulmonary/Chest: Effort normal and breath sounds normal. No respiratory distress. He has no wheezes. He has no rales.  Abdominal: Soft. He exhibits no distension. There is no tenderness. There is no guarding.  Musculoskeletal: He exhibits no edema.  Neurological: He is alert and oriented to person, place, and time.  Skin: Skin is warm and dry. He is not diaphoretic.  Nursing note and vitals reviewed.   ED Course  Procedures (including critical care time) Labs Review Labs Reviewed  APTT - Abnormal; Notable for the following:    aPTT 23 (*)    All other components within normal limits  CBC - Abnormal; Notable for the following:    WBC 12.1 (*)    RBC 2.00 (*)    Hemoglobin 6.2 (*)    HCT 18.5 (*)    RDW 15.9 (*)    All other components within normal limits  COMPREHENSIVE METABOLIC PANEL - Abnormal; Notable for the following:    Potassium 2.6 (*)    Chloride 95 (*)    Glucose, Bld 163 (*)    BUN 114 (*)    Creatinine, Ser 3.87 (*)     Calcium 8.8 (*)    Total Protein 5.1 (*)    Albumin 2.8 (*)    AST 51 (*)    GFR calc non Af Amer 13 (*)    GFR calc Af Amer 15 (*)    Anion gap 18 (*)    All other components within normal limits  BRAIN NATRIURETIC PEPTIDE - Abnormal; Notable for the following:    B Natriuretic Peptide 2120.5 (*)    All other components within normal limits  MAGNESIUM - Abnormal; Notable for the following:    Magnesium 2.6 (*)    All other components within normal limits  GLUCOSE, CAPILLARY - Abnormal; Notable for the following:    Glucose-Capillary 164 (*)    All other components within normal limits  TROPONIN I - Abnormal; Notable for the following:    Troponin I 8.35 (*)    All other components within normal limits  MAGNESIUM - Abnormal; Notable for the following:    Magnesium 2.6 (*)    All other components within normal limits  I-STAT TROPOININ, ED - Abnormal; Notable for the following:    Troponin i, poc 8.82 (*)    All other components within normal limits  MRSA PCR SCREENING  PROTIME-INR  TROPONIN I  TROPONIN I  IRON AND TIBC  URINALYSIS, ROUTINE W REFLEX MICROSCOPIC (NOT AT [facility])  PARATHYROID HORMONE, INTACT (NO CA)  CBC  PHOSPHORUS  COMPREHENSIVE METABOLIC PANEL  VITAMIN B12  FOLATE RBC  BASIC METABOLIC PANEL  CBC  TYPE AND SCREEN  PREPARE RBC (CROSSMATCH)    Imaging Review Dg Chest 1 View  11/16/2015  CLINICAL DATA:  Hypoxia and shortness of breath. Status post cardiac catheterization. EXAM: CHEST 1 VIEW COMPARISON:  Film earlier today at 1034 hours FINDINGS: Similar pattern of congestive heart failure with bilateral edema. No significant pleural fluid identified. The heart size is stable and within normal limits. No pneumothorax. IMPRESSION: Similar appearance of CHF compared to the prior study earlier today Electronically Signed   By: Irish Lack M.D.   On: 11/16/2015 18:10   Dg Chest Portable 1 View  11/16/2015  CLINICAL DATA:  Shortness of breath and confusion  EXAM: PORTABLE CHEST 1 VIEW COMPARISON:  September 21, 2015  FINDINGS: There is moderate generalized interstitial edema and patchy alveolar edema in the right base. Lungs elsewhere clear. Heart is upper normal in size with pulmonary vascular within normal limits. No adenopathy. No pneumothorax. No bone lesions. IMPRESSION: Evidence of a degree of congestive heart failure. Probable alveolar edema right lower lobe, although a small focus of superimposed pneumonia cannot be excluded radiographically. Electronically Signed   By: Bretta Bang III M.D.   On: 11/16/2015 10:43   I have personally reviewed and evaluated these images and lab results as part of my medical decision-making.   EKG Interpretation   Date/Time:  Wednesday November 16 2015 09:44:18 EST Ventricular Rate:  96 PR Interval:  183 QRS Duration: 100 QT Interval:  330 QTC Calculation: 417 R Axis:   74 Text Interpretation:  Sinus rhythm Probable LVH with secondary repol abnrm  Anterior Q waves, possibly due to LVH ST depression, consider ischemia,  diffuse lds, new from prior ECG Confirmed by [facility] MD, Anniya Whiters ([id number])  on 11/16/2015 9:49:35 AM      CRITICAL CARE: CODE STEMI Performed by: Rhae Lerner   Total critical care time: 30 minutes  Critical care time was exclusive of separately billable procedures and treating other patients.  Critical care was necessary to treat or prevent imminent or life-threatening deterioration.  Critical care was time spent personally by me on the following activities: development of treatment plan with patient and/or surrogate as well as nursing, discussions with consultants, evaluation of patient's response to treatment, examination of patient, obtaining history from patient or surrogate, ordering and performing treatments and interventions, ordering and review of laboratory studies, ordering and review of radiographic studies, pulse oximetry and re-evaluation of patient's  condition.  MDM   Final diagnoses:  ST elevation myocardial infarction (STEMI), unspecified artery (HCC)-initial concern for STEMI, however no obstructive disease on catheterization, likely NSTEMI  Anemia, unspecified anemia type  CKD stage 4 secondary to hypertension (HCC)  Elevated troponin  Ischemic chest pain [facility])   82 year old male with a history of diabetes, hypertension, hypercholesterolemia, peripheral vascular disease, coronary artery disease, carotid artery disease, CK D, admission in November for NSTEMI, acute respiratory failure requiring intubation and pneumonia presents with concern of chest pain and shortness of breath. There was a report of fever, and atrial fibrillation which were placed into the triage note, however on arrival to the emergency department family reports patient presented due to chest pain, and patient was noted to have no signs of atrial fibrillation, and no fever both orally and rectally. Family and patient deny infectious symptoms including no cough, no urinary symptoms, no abdominal pain.   After discussing that chest pain was reason for arrival to ED with patient and his family, and evaluating patient's EKG, called a code STEMI for concern of elevation in aVR with diffuse ST depressions concerning for left main disease. Patient's i-STAT troponin was 8.8, supporting initial concern for STEMI.  He was given aspirin, and started on a heparin drip. Discussed patient with Dr. Jacinto Halim and activated the cath lab. After our discussion, patient's hemoglobin returned at 6.3, and discussed this with Cath Lab who reported they would discuss with Dr. Jacinto Halim. Family reports that patient has had chronic anemia (previously 7-8) and was previously on Procrit injections, however has not had them for the past month, and both patient and family deny any sources of bleeding, including no black or bloody stools.  Ordered type and screen for transfusion.  Patient taken emergently to the Cath  Lab for  further evaluation.   Alvira Monday, MD 11/16/15 1950

## 2015-11-16 NOTE — Progress Notes (Signed)
Resting quietly and still on BIPAP.

## 2015-11-16 NOTE — Progress Notes (Signed)
Placed back on facemask

## 2015-11-16 NOTE — Progress Notes (Signed)
Dr. Jacinto Halim paged and made aware of patient c/o chest tightness. Orders taken.

## 2015-11-16 NOTE — Progress Notes (Signed)
Patient asked that face mask come off. Removed mask and placed on 6L [place]

## 2015-11-16 NOTE — Progress Notes (Signed)
Bedside echo being done.

## 2015-11-16 NOTE — Progress Notes (Signed)
RT called by RN for stat bipap at Cath lab Holding Room #3. Pt feeling SOB on NRB with increased WOB. Pt started on v60 Bipap at 14/8 for fluid overload. Rt will continue to monitor. CCM NP Hoffman at bedside.

## 2015-11-16 NOTE — Progress Notes (Addendum)
Have increased O2 [place] from 2L to 4L. Denies chest discomfort. Dr. Jacinto Halim paged regarding shortness of breath, wheezing, UOP. Becoming somewhat restless

## 2015-11-16 NOTE — ED Notes (Signed)
Patient transported to cath lab by [facility]

## 2015-11-16 NOTE — Progress Notes (Signed)
ANTICOAGULATION CONSULT NOTE - Initial Consult  Pharmacy Consult for heparin Indication: chest pain/ACS/STEMI  Allergies  Allergen Reactions  . Labetalol Swelling    angioedema    Patient Measurements: Height:  (167.6 cm) Weight: 166 lb (75.297 kg) IBW/kg (Calculated) : 63.8 Heparin Dosing Weight: 75.3kg  Vital Signs: Temp: 98.1 F (36.7 C) (01/18 0952) Temp Source: Rectal (01/18 0952) BP: 174/51 mmHg (01/18 0942) Pulse Rate: 97 (01/18 0942)  Labs: No results for input(s): HGB, HCT, PLT, APTT, LABPROT, INR, HEPARINUNFRC, CREATININE, CKTOTAL, CKMB, TROPONINI in the last 72 hours.  CrCl cannot be calculated (Patient has no serum creatinine result on file.).   Medical History: Past Medical History  Diagnosis Date  . Diabetes mellitus   . Hypertension   . Hypercholesteremia   . Gout   . PVD (peripheral vascular disease) (HCC) 7/05    LCE, known 60% RICA 7/12  . CAD (coronary artery disease) 2002    non critical  . Macular degeneration   . Chronic renal disease, stage III   . Claudication (HCC)   . Palpitations     PVCs and PSVT on Holter monitoring  . Bilateral carotid artery disease (HCC)     status post left carotid endarterectomy performed by Dr. Madilyn Fireman 05/12/04  . Anemia      Assessment: 85 yom with CC of fever, tachycardia, afib with RVR - resolving. Pharmacy consulted to dose heparin for ACS/CP. No anticoag noted on med rec. Labs pending. No bleed documented.  Heparin 4000 unit bolus already ordered in ED. Spoke with MD - STEMI page didn't go through initially and wants to start heparin drip until team gets to ED. Entered lab orders but will probably need to d/c.  Goal of Therapy:  Heparin level 0.3-0.7 units/ml Monitor platelets by anticoagulation protocol: Yes   Plan:  Heparin 4000 unit bolus in ED Heparin at 900 units/h 8h HL Daily HL/CBC Mon s/sx bleeding Probably to cath lab shortly per MD  Babs Bertin, PharmD, BCPS Clinical  Pharmacist Pager [phone] 11/16/2015 10:32 AM

## 2015-11-16 NOTE — Progress Notes (Signed)
Critical Care NP here. STAT PCXR ordered. RT called to place patient on BPAP.

## 2015-11-16 NOTE — Progress Notes (Signed)
Pt transported from Cath lab holding to 2M05 on Bipap. Vitals remained stable throughout.

## 2015-11-17 ENCOUNTER — Inpatient Hospital Stay (HOSPITAL_COMMUNITY): Payer: Medicare Other

## 2015-11-17 ENCOUNTER — Encounter (HOSPITAL_COMMUNITY): Payer: Self-pay | Admitting: Cardiology

## 2015-11-17 LAB — CBC
HCT: 20.2 % — ABNORMAL LOW (ref 39.0–52.0)
HCT: 18.5 % — ABNORMAL LOW (ref 39.0–52.0)
HEMOGLOBIN: 6.8 g/dL — AB (ref 13.0–17.0)
Hemoglobin: 6.2 g/dL — CL (ref 13.0–17.0)
MCH: 30.4 pg (ref 26.0–34.0)
MCH: 31 pg (ref 26.0–34.0)
MCHC: 33.5 g/dL (ref 30.0–36.0)
MCHC: 33.7 g/dL (ref 30.0–36.0)
MCV: 90.2 fL (ref 78.0–100.0)
MCV: 92.5 fL (ref 78.0–100.0)
PLATELETS: 160 10*3/uL (ref 150–400)
PLATELETS: 175 10*3/uL (ref 150–400)
RBC: 2 MIL/uL — ABNORMAL LOW (ref 4.22–5.81)
RBC: 2.24 MIL/uL — AB (ref 4.22–5.81)
RDW: 17.6 % — ABNORMAL HIGH (ref 11.5–15.5)
RDW: 15.9 % — AB (ref 11.5–15.5)
WBC: 11.6 10*3/uL — ABNORMAL HIGH (ref 4.0–10.5)
WBC: 12.1 10*3/uL — ABNORMAL HIGH (ref 4.0–10.5)

## 2015-11-17 LAB — GLUCOSE, CAPILLARY
GLUCOSE-CAPILLARY: 129 mg/dL — AB (ref 65–99)
Glucose-Capillary: 150 mg/dL — ABNORMAL HIGH (ref 65–99)
Glucose-Capillary: 192 mg/dL — ABNORMAL HIGH (ref 65–99)
Glucose-Capillary: 176 mg/dL — ABNORMAL HIGH (ref 65–99)

## 2015-11-17 LAB — COMPREHENSIVE METABOLIC PANEL
ALT: 20 U/L (ref 17–63)
AST: 53 U/L — ABNORMAL HIGH (ref 15–41)
Albumin: 2.6 g/dL — ABNORMAL LOW (ref 3.5–5.0)
Alkaline Phosphatase: 77 U/L (ref 38–126)
Anion gap: 19 — ABNORMAL HIGH (ref 5–15)
BUN: 122 mg/dL — ABNORMAL HIGH (ref 6–20)
CHLORIDE: 94 mmol/L — AB (ref 101–111)
CO2: 26 mmol/L (ref 22–32)
CREATININE: 4.07 mg/dL — AB (ref 0.61–1.24)
Calcium: 8.7 mg/dL — ABNORMAL LOW (ref 8.9–10.3)
GFR calc non Af Amer: 12 mL/min — ABNORMAL LOW (ref >60)
GFR, EST AFRICAN AMERICAN: 14 mL/min — AB (ref >60)
Glucose, Bld: 181 mg/dL — ABNORMAL HIGH (ref 65–99)
POTASSIUM: 4.2 mmol/L (ref 3.5–5.1)
SODIUM: 139 mmol/L (ref 135–145)
Total Bilirubin: 1.4 mg/dL — ABNORMAL HIGH (ref 0.3–1.2)
Total Protein: 5 g/dL — ABNORMAL LOW (ref 6.5–8.1)

## 2015-11-17 LAB — TROPONIN I: TROPONIN I: 11.13 ng/mL — AB (ref <0.031)

## 2015-11-17 LAB — PHOSPHORUS: PHOSPHORUS: 11 mg/dL — AB (ref 2.5–4.6)

## 2015-11-17 MED ORDER — METOPROLOL SUCCINATE ER 100 MG PO TB24
50.0000 mg | ORAL_TABLET | Freq: Every day | ORAL | Status: DC
  Administered 2015-11-17 – 2015-11-18 (×2): 50 mg via ORAL
  Filled 2015-11-17 (×3): qty 1

## 2015-11-17 MED ORDER — AMLODIPINE BESYLATE 5 MG PO TABS
5.0000 mg | ORAL_TABLET | Freq: Every day | ORAL | Status: DC
  Administered 2015-11-17: 5 mg via ORAL
  Filled 2015-11-17 (×2): qty 1

## 2015-11-17 MED ORDER — FUROSEMIDE 10 MG/ML IJ SOLN
160.0000 mg | Freq: Four times a day (QID) | INTRAVENOUS | Status: DC
  Administered 2015-11-17 – 2015-11-18 (×5): 160 mg via INTRAVENOUS
  Filled 2015-11-17 (×7): qty 16

## 2015-11-17 MED ORDER — LANTHANUM CARBONATE 500 MG PO CHEW
1000.0000 mg | CHEWABLE_TABLET | Freq: Three times a day (TID) | ORAL | Status: DC
  Administered 2015-11-17 – 2015-11-26 (×19): 1000 mg via ORAL
  Filled 2015-11-17 (×23): qty 2

## 2015-11-17 MED ORDER — FUROSEMIDE 80 MG PO TABS
160.0000 mg | ORAL_TABLET | Freq: Three times a day (TID) | ORAL | Status: DC
  Filled 2015-11-17 (×3): qty 2

## 2015-11-17 MED ORDER — HYDRALAZINE HCL 25 MG PO TABS
25.0000 mg | ORAL_TABLET | Freq: Three times a day (TID) | ORAL | Status: DC
  Administered 2015-11-17 (×3): 25 mg via ORAL
  Filled 2015-11-17 (×5): qty 1

## 2015-11-17 MED ORDER — ALLOPURINOL 100 MG PO TABS
100.0000 mg | ORAL_TABLET | Freq: Every day | ORAL | Status: DC
  Administered 2015-11-18: 100 mg via ORAL
  Filled 2015-11-17: qty 1

## 2015-11-17 MED ORDER — ALPRAZOLAM 0.25 MG PO TABS
0.2500 mg | ORAL_TABLET | Freq: Once | ORAL | Status: AC
  Administered 2015-11-17: 0.25 mg via ORAL
  Filled 2015-11-17: qty 1

## 2015-11-17 MED ORDER — METOLAZONE 10 MG PO TABS
10.0000 mg | ORAL_TABLET | Freq: Every day | ORAL | Status: DC
  Administered 2015-11-17: 10 mg via ORAL
  Filled 2015-11-17 (×2): qty 1

## 2015-11-17 MED FILL — Nitroglycerin IV Soln 100 MCG/ML in D5W: INTRA_ARTERIAL | Qty: 10 | Status: AC

## 2015-11-17 MED FILL — Lidocaine HCl Local Preservative Free (PF) Inj 1%: INTRAMUSCULAR | Qty: 30 | Status: AC

## 2015-11-17 MED FILL — Heparin Sodium (Porcine) 2 Unit/ML in Sodium Chloride 0.9%: INTRAMUSCULAR | Qty: 1500 | Status: AC

## 2015-11-17 NOTE — Progress Notes (Signed)
Subjective: Interval History: has complaints breathing a little better.  Objective: Vital signs in last 24 hours: Temp:  [97.2 F (36.2 C)-98.5 F (36.9 C)] 97.2 F (36.2 C) (01/19 0348) Pulse Rate:  [0-147] 70 (01/19 0700) Resp:  [0-29] 22 (01/19 0700) BP: (104-178)/(33-90) 123/38 mmHg (01/19 0700) SpO2:  [0 %-100 %] 98 % (01/19 0700) Weight:  [70.6 kg (155 lb 10.3 oz)-75.297 kg (166 lb)] 70.6 kg (155 lb 10.3 oz) (01/18 2000) Weight change:   Intake/Output from previous day: 01/18 0701 - 01/19 0700 In: 970.7 [I.V.:558.7; Blood:350; IV Piggyback:62] Out: 260 [Urine:260] Intake/Output this shift:    General appearance: cooperative, mild distress and pale Resp: diminished breath sounds bilaterally, rales bibasilar and wheezes bilaterally Cardio: S1, S2 normal and systolic murmur: systolic ejection 2/6, decrescendo at 2nd left intercostal space, Gr 2/6 dia M GI: pos bs, liver down 6 cm,  Extremities: edema 2+  Lab Results:  Recent Labs  11/16/15 2043 11/17/15 0049  WBC 12.0* 11.6*  HGB 7.3* 6.8*  HCT 21.9* 20.2*  PLT 203 175   BMET:  Recent Labs  11/16/15 2043 11/17/15 0049  NA 138 139  K 3.6 4.2  CL 91* 94*  CO2 28 26  GLUCOSE 178* 181*  BUN 120* 122*  CREATININE 3.96* 4.07*  CALCIUM 8.9 8.7*   No results for input(s): PTH in the last 72 hours. Iron Studies:  Recent Labs  11/16/15 2043  IRON 97  TIBC 235*    Studies/Results: Dg Chest 1 View  11/16/2015  CLINICAL DATA:  Hypoxia and shortness of breath. Status post cardiac catheterization. EXAM: CHEST 1 VIEW COMPARISON:  Film earlier today at 1034 hours FINDINGS: Similar pattern of congestive heart failure with bilateral edema. No significant pleural fluid identified. The heart size is stable and within normal limits. No pneumothorax. IMPRESSION: Similar appearance of CHF compared to the prior study earlier today Electronically Signed   By: Irish Lack M.D.   On: 11/16/2015 18:10   Dg Chest Portable  1 View  11/16/2015  CLINICAL DATA:  Shortness of breath and confusion EXAM: PORTABLE CHEST 1 VIEW COMPARISON:  September 21, 2015 FINDINGS: There is moderate generalized interstitial edema and patchy alveolar edema in the right base. Lungs elsewhere clear. Heart is upper normal in size with pulmonary vascular within normal limits. No adenopathy. No pneumothorax. No bone lesions. IMPRESSION: Evidence of a degree of congestive heart failure. Probable alveolar edema right lower lobe, although a small focus of superimposed pneumonia cannot be excluded radiographically. Electronically Signed   By: Bretta Bang III M.D.   On: 11/16/2015 10:43    I have reviewed the patient's current medications.  Assessment/Plan: 1 CKD 4-5 vol xs, will try to diurese. Cr rising.  Suspect will get worse before better.  Need to get vol off .  Transfusion.  2 Anemia if makes urine transfuse 3 HPTH vit D 4 HTN lower meds to allow better RBF 5 Copd 6 PVD P Lasix, metol, K lower bp meds.  If diureses, Tx    LOS: 1 day   [name] L 11/17/2015,7:10 AM

## 2015-11-17 NOTE — Progress Notes (Signed)
Noted desatting 88% on  n/c 02 started at 40% FIO2 by venti mask.

## 2015-11-17 NOTE — Progress Notes (Signed)
CRITICAL VALUE ALERT  Critical value received:  Hemoglobin 6.8  Date of notification:  11/17/2015  Time of notification:  0140  Critical value read back:Yes.    Nurse who received alert:  Lillia Corporal  MD notified (1st page):  Dr. Allena Katz  Time of first page:  0140  MD notified (2nd page):  Time of second page:  Responding MD:  Dr. Allena Katz  Time MD responded:  [phone]

## 2015-11-17 NOTE — Progress Notes (Signed)
Received patient from 49M; placed on  02 at  6 liters n/c CCMD notified. VS obtained.

## 2015-11-17 NOTE — Progress Notes (Signed)
EKG CRITICAL VALUE     12 lead EKG performed.  Critical value noted.  Meredith Staggers, RN notified.   Oda Cogan, CCT 11/17/2015 7:37AM

## 2015-11-17 NOTE — Progress Notes (Signed)
eLink Physician-Brief Progress Note Patient Name: [name] DOB: [birth year] MRN: [id number]   Date of Service  11/17/2015  HPI/Events of Note  Anxiety - patient requests something to "settle him".  eICU Interventions  Will order Xanax 0.25 mg PO X 1 now.      Intervention Category Minor Interventions: Agitation / anxiety - evaluation and management  Sommer,Steven Eugene 11/17/2015, 10:09 PM

## 2015-11-17 NOTE — Progress Notes (Signed)
PULMONARY / CRITICAL CARE MEDICINE   Name: [name] MRN: [id number] DOB: 05-03-1930    ADMISSION DATE:  11/16/2015 CONSULTATION DATE:  11/16/15  REFERRING MD :  Dr. Jacinto Halim PRIMARY SERVICE: PCCM   BRIEF PATIENT DESCRIPTION: 82 yo M who presented on 1/18 for chest pain found to have NSTEMI s/p LHC with onset of acute pulmonary edema after blood transfusion.   SIGNIFICANT EVENTS / STUDIES:  1/18: Admitted with CP found to have NSTEMI with LHC with no significant blockage.   1/18 Hg 6.2 requiring blood transfusion shortly after which developed respiratory failure  1/18 CXR with pulmonary edema started on IV lasix  1/19 CXR with no change. Minimal diuresis.   LINES / TUBES: None  CULTURES: 1/18 MRSA neg   ANTIBIOTICS: None  SUBJECTIVE: Pt reports breathing is at baseline or mildly worse. Currently on 3 L [place]. Denies CP. On high dose lasix but only 260 cc of urine output. Reports visual hallucinations which he has occasionally at home.   VITAL SIGNS: Temp:  [97 F (36.1 C)-98.5 F (36.9 C)] 97 F (36.1 C) (01/19 1118) Pulse Rate:  [55-99] 71 (01/19 1130) Resp:  [13-29] 17 (01/19 1130) BP: (104-178)/(35-90) 134/81 mmHg (01/19 1130) SpO2:  [87 %-100 %] 94 % (01/19 1130) Weight:  [155 lb 3.3 oz (70.4 kg)-155 lb 10.3 oz (70.6 kg)] 155 lb 3.3 oz (70.4 kg) (01/19 0800) HEMODYNAMICS:   VENTILATOR SETTINGS:   INTAKE / OUTPUT: Intake/Output      01/18 0701 - 01/19 0700 01/19 0701 - 01/20 0700   P.O.  150   I.V. (mL/kg) 580.7 (8.2) 90 (1.3)   Blood 350    IV Piggyback 62 132   Total Intake(mL/kg) 992.7 (14.1) 372 (5.3)   Urine (mL/kg/hr) 260    Total Output 260     Net +732.7 +372          PHYSICAL EXAMINATION: General:  NAD, chronically ill appearing  Neuro:  Alert and orientated, conversant  HEENT: Normocephalic  Cardiovascular:  Normal rate and rhythm with systolic murmur   Lungs: Mildly increased work of breathing. Course breath sounds. On [place].    Abdomen:  Soft,  mildly distended, non-tender, normal BS Musculoskeletal: SCD's, +1 edema  Skin:  No rashes   LABS:  CBC  Recent Labs Lab 11/16/15 1019 11/16/15 2043 11/17/15 0049  WBC 12.1* 12.0* 11.6*  HGB 6.2* 7.3* 6.8*  HCT 18.5* 21.9* 20.2*  PLT 160 203 175   Coag's  Recent Labs Lab 11/16/15 1019  APTT 23*  INR 1.13   BMET  Recent Labs Lab 11/16/15 1019 11/16/15 2043 11/17/15 0049  NA 139 138 139  K 2.6* 3.6 4.2  CL 95* 91* 94*  CO2 BUN 114* 120* 122*  CREATININE 3.87* 3.96* 4.07*  GLUCOSE 163* 178* 181*   Electrolytes  Recent Labs Lab 11/16/15 1019 11/16/15 1335 11/16/15 2043 11/17/15 0049  CALCIUM 8.8*  --  8.9 8.7*  MG 2.6* 2.6*  --   --   PHOS  --   --   --  11.0*   Sepsis Markers No results for input(s): LATICACIDVEN, PROCALCITON, O2SATVEN in the last 168 hours. ABG No results for input(s): PHART, PCO2ART, PO2ART in the last 168 hours. Liver Enzymes  Recent Labs Lab 11/16/15 1019 11/17/15 0049  AST 51* 53*  ALT 19 20  ALKPHOS 79 77  BILITOT 1.0 1.4*  ALBUMIN 2.8* 2.6*   Cardiac Enzymes  Recent Labs Lab 11/16/15 1335 11/16/15  2043 11/17/15 0049  TROPONINI 8.35* 9.49* 11.13*   Glucose  Recent Labs Lab 11/16/15 1220 11/16/15 2031 11/17/15 0741 11/17/15 1117  GLUCAP 164* 164* 150* 192*    Imaging Dg Chest 1 View  11/16/2015  CLINICAL DATA:  Hypoxia and shortness of breath. Status post cardiac catheterization. EXAM: CHEST 1 VIEW COMPARISON:  Film earlier today at 1034 hours FINDINGS: Similar pattern of congestive heart failure with bilateral edema. No significant pleural fluid identified. The heart size is stable and within normal limits. No pneumothorax. IMPRESSION: Similar appearance of CHF compared to the prior study earlier today Electronically Signed   By: Irish Lack M.D.   On: 11/16/2015 18:10   Dg Chest Port 1 View  11/17/2015  CLINICAL DATA:  Shortness of breath. EXAM: PORTABLE CHEST 1 VIEW COMPARISON:   11/16/2015. FINDINGS: Cardiomegaly with persistent unchanged diffuse bilateral pulmonary infiltrates consistent with pulmonary edema. No pleural effusion or pneumothorax. No acute bony abnormality. IMPRESSION: Cardiomegaly with persistent diffuse bilateral pulmonary infiltrates consistent with pulmonary edema. No interim change. Electronically Signed   By: Maisie Fus  Register   On: 11/17/2015 07:18   Dg Chest Portable 1 View  11/16/2015  CLINICAL DATA:  Shortness of breath and confusion EXAM: PORTABLE CHEST 1 VIEW COMPARISON:  September 21, 2015 FINDINGS: There is moderate generalized interstitial edema and patchy alveolar edema in the right base. Lungs elsewhere clear. Heart is upper normal in size with pulmonary vascular within normal limits. No adenopathy. No pneumothorax. No bone lesions. IMPRESSION: Evidence of a degree of congestive heart failure. Probable alveolar edema right lower lobe, although a small focus of superimposed pneumonia cannot be excluded radiographically. Electronically Signed   By: Bretta Bang III M.D.   On: 11/16/2015 10:43     CXR: Cardiomegaly with persistent diffuse bilateral pulmonary infiltrates consistent with pulmonary edema. No interim change.   ASSESSMENT / PLAN:  PULMONARY A: Acute pulmonary edema in setting of volume overload (CKD Stage 4 and CHF) History of tobacco abuse P:   Continue IV lasix 160 mg QID as BP tolerates Oxygen therapy or Bipap if needed to keep SpO2 >92% CXR in AM  CARDIOVASCULAR A: NSTEMI with peak trop 18- LHC with no significant obstructive disease, most likely demand ischemia in setting of acute on chronic anemia  Acute on chronic grade 2 diastolic CHF - Last echo on 11/16/15 Hypertension Hyperlipidemia  PVD P:  Continue IV lasix 160 mg QID and metalazone 10 mg daily as BP/renal function tolerates Wean nitro drip Continue aspirin 81 mg daily and plavix 75 mg daily Continue lipitor 80 mg daily    Continue toprol-Xl 50 mg  daily, hydralazine 25 mg TID, amlodipine 10 mg daily, imdur 60 mg daily  May need to cut back on anti-hypertensives if becomes hypotensive  RENAL A:  Acute on chronic CKD Stage 4 Non-tophaceous gout Hyperphosphatemia in setting of CKD Stage 4 P:   Continue IV lasix 160 mg QID and metalazone 10 mg daily as BP/renal function tolerates May need HD if does not diurese  Continue calcitriol 0.5 mcg daily and lanthanum 1000 mg TID  Continue allopurinol 100 mg daily   Limit to 1200 cc fluid and 2 g salt intake  GASTROINTESTINAL A: Nutrition  GI Ppx P:   Heart healthy/carb modified  PO protonix 40 mg daily   HEMATOLOGIC A:  Symptomatic acute on chronic anemia of renal disease - no active bleeding DVT Ppx P:  Hold blood transfusion unless active bleed in setting of volume overload Continue  aranesp Q2 weekly, iron reserves adequate with Tstat 41% D/c SQ heparin TID in setting of acute anemia  SCD's in setting of anemia   INFECTIOUS A:  Leukocytosis in setting of NSTEMI P:   Monitor for hospital acquired infection  ENDOCRINE A:  Non-insulin dependent Type 2 DM - Last A1c 6.1 on 09/22/15 Secondary hyperparathyroidism in setting of CKD 4 P:   Continue SSI for goal <180 Continue home januvia 25 mg daily  Continue calcitriol 0.5 mcg daily  Follow-up PTH level  NEUROLOGIC A:  Dementia  Delirium with visual hallucinations   P:   Transfer out of ICU Minimize distractions    Nyheem Binette PGY-3 IMTS Pager [phone]    11/17/2015, 2:19 PM

## 2015-11-17 NOTE — Progress Notes (Signed)
Subjective:  Laying in bed resting, states he is feeling somewhat better but very tired and didn't sleep much last night.  Objective:  Vital Signs in the last 24 hours: Temp:  [97.2 F (36.2 C)-98.5 F (36.9 C)] 97.4 F (36.3 C) (01/19 0751) Pulse Rate:  [0-147] 70 (01/19 0700) Resp:  [0-29] 22 (01/19 0700) BP: (104-178)/(33-90) 137/55 mmHg (01/19 0800) SpO2:  [0 %-100 %] 98 % (01/19 0700) Weight:  [70.4 kg (155 lb 3.3 oz)-75.297 kg (166 lb)] 70.4 kg (155 lb 3.3 oz) (01/19 0800)  Intake/Output from previous day: 01/18 0701 - 01/19 0700 In: 992.7 [I.V.:580.7; Blood:350; IV Piggyback:62] Out: 260 [Urine:260]  Physical Exam: General appearance: alert, cooperative, appears older than stated age, mild distress and appears ill Eyes: negative findings: lids and lashes normal Neck: no JVD and supple, symmetrical, trachea midline, bilateral carotid bruit Resp: wheezing and crackles bilaterally Chest wall: no tenderness Cardio: distant heart sounds, II/IV SEM  GI: soft, non-tender; bowel sounds normal; no masses;hepatomegaly Extremities: extremities normal, atraumatic, no cyanosis or edema Pulses: feeble pedal pulses, bilateral femoral bruits; right femoral access site otherwise asymptomatic Skin: Skin color, texture, turgor normal. No rashes or lesions Neurologic: Grossly normal  Lab Results: BMP  Recent Labs  11/16/15 1019 11/16/15 2043 11/17/15 0049  NA 139 138 139  K 2.6* 3.6 4.2  CL 95* 91* 94*  CO2 GLUCOSE 163* 178* 181*  BUN 114* 120* 122*  CREATININE 3.87* 3.96* 4.07*  CALCIUM 8.8* 8.9 8.7*  GFRNONAA 13* 13* 12*  GFRAA 15* 15* 14*    CBC  Recent Labs Lab 11/17/15 0049  WBC 11.6*  RBC 2.24*  HGB 6.8*  HCT 20.2*  PLT 175  MCV 90.2  MCH 30.4  MCHC 33.7  RDW 17.6*    HEMOGLOBIN A1C Lab Results  Component Value Date   HGBA1C 6.1* 09/22/2015   MPG 128 09/22/2015    Cardiac Panel (last 3 results)  Recent Labs  11/16/15 1335  11/16/15 2043 11/17/15 0049  TROPONINI 8.35* 9.49* 11.13*    BNP (last 3 results) No results for input(s): PROBNP in the last 8760 hours.  TSH No results for input(s): TSH in the last 8760 hours.  CHOLESTEROL  Recent Labs  09/23/15 0403  CHOL 110    Hepatic Function Panel  Recent Labs  03/22/15 0422 03/24/15 0531  09/23/15 0403 11/16/15 1019 11/17/15 0049  PROT 5.9* 5.3*  < > 4.9* 5.1* 5.0*  ALBUMIN 3.2* 2.5*  < > 2.4* 2.8* 2.6*  AST 372* 43*  < > 29 51* 53*  ALT 292* 104*  < > 35 19 20  ALKPHOS 318* 193*  < > 85 79 77  BILITOT 1.2 0.9  < > 0.9 1.0 1.4*  BILIDIR 0.4 0.4  --   --   --   --   IBILI 0.8 0.5  --   --   --   --   < > = values in this interval not displayed.  Imaging: Dg Chest 1 View  11/16/2015  CLINICAL DATA:  Hypoxia and shortness of breath. Status post cardiac catheterization. EXAM: CHEST 1 VIEW COMPARISON:  Film earlier today at 1034 hours FINDINGS: Similar pattern of congestive heart failure with bilateral edema. No significant pleural fluid identified. The heart size is stable and within normal limits. No pneumothorax. IMPRESSION: Similar appearance of CHF compared to the prior study earlier today Electronically Signed   By: Irish Lack M.D.   On: 11/16/2015 18:10  Dg Chest Port 1 View  11/17/2015  CLINICAL DATA:  Shortness of breath. EXAM: PORTABLE CHEST 1 VIEW COMPARISON:  11/16/2015. FINDINGS: Cardiomegaly with persistent unchanged diffuse bilateral pulmonary infiltrates consistent with pulmonary edema. No pleural effusion or pneumothorax. No acute bony abnormality. IMPRESSION: Cardiomegaly with persistent diffuse bilateral pulmonary infiltrates consistent with pulmonary edema. No interim change. Electronically Signed   By: Maisie Fus  Register   On: 11/17/2015 07:18   Dg Chest Portable 1 View  11/16/2015  CLINICAL DATA:  Shortness of breath and confusion EXAM: PORTABLE CHEST 1 VIEW COMPARISON:  September 21, 2015 FINDINGS: There is moderate  generalized interstitial edema and patchy alveolar edema in the right base. Lungs elsewhere clear. Heart is upper normal in size with pulmonary vascular within normal limits. No adenopathy. No pneumothorax. No bone lesions. IMPRESSION: Evidence of a degree of congestive heart failure. Probable alveolar edema right lower lobe, although a small focus of superimposed pneumonia cannot be excluded radiographically. Electronically Signed   By: Bretta Bang III M.D.   On: 11/16/2015 10:43    Cardiac Studies: EKG 11/17/2015: sinus rhythm at a rate of 69, bpm, ST intervals normalizing. QTc 561.   EKG: 11/16/2015: Normal sinus rhythm at a rate of 91 bpm, frequent PACs, diffuse ST segment depression in inferior and lateral leads with ST elevation in aVR, cannot exclude global ischemia. LVH. ST changes are new compared to prior EKG.  Echocardiogram 11/16/2015: LV cavity size was normal. Mild concentric hypertrophy. LVEF50% to 55%. Mild diffuse hypokinesis. Grade 2 diastolic dysfunction. Doppler parameters areconsistent with both elevated ventricular end-diastolic fillingpressure and elevated left atrial filling pressure. Mitral valve: Calcified annulus. Atrial septum: No defect or patent foramen ovale was identified. Pulmonary arteries: PA peak pressure: 75 mm Hg (S). Pericardium, extracardiac: A small pericardial effusion wasidentified. The fluid had no internal echoes.There was no evidence of hemodynamic compromise.  Assessment/Plan:  1. Acute coronary syndrome, NSTEMI, markedly elevated serum troponin and markedly abnormal EKG suggestive of multivessel disease or left main disease. Patient has ongoing chest discomfort. Patient also has history of NSTEMI in November 2016 when he presented with pneumonia and was medically managed. 2. Stage IV- V chronic kidney disease 3. Chronic anemia recent drop in the past 1 month, no history to suggest GI bleed or any obvious bleed. 4. Diabetes mellitus type 2  uncontrolled  Recommendation: Coronary angiography revealed no significant high-grade lesion. Suspect demand ischemia from severe anemia. Currently being managed by pulmonology and nephrology for acute on chronic CKD, pulm HTN, and pulmonary edema following cardiac catheterization. Only minimal urine output with I&O total still positive. Not much more to offer from a cardiac standpoint.   Erling Conte, NP-C  11/17/2015, 8:55 AM [facility] Pager: [phone] Office: [phone]

## 2015-11-18 ENCOUNTER — Inpatient Hospital Stay (HOSPITAL_COMMUNITY): Payer: Medicare Other

## 2015-11-18 ENCOUNTER — Telehealth: Payer: Self-pay | Admitting: Diagnostic Radiology

## 2015-11-18 DIAGNOSIS — Z992 Dependence on renal dialysis: Secondary | ICD-10-CM

## 2015-11-18 DIAGNOSIS — R410 Disorientation, unspecified: Secondary | ICD-10-CM

## 2015-11-18 DIAGNOSIS — N186 End stage renal disease: Secondary | ICD-10-CM

## 2015-11-18 DIAGNOSIS — N184 Chronic kidney disease, stage 4 (severe): Secondary | ICD-10-CM

## 2015-11-18 LAB — FOLATE RBC
FOLATE, RBC: 1677 ng/mL (ref >498)
Folate, Hemolysate: 369 ng/mL
HEMATOCRIT: 22 % — AB (ref 37.5–51.0)

## 2015-11-18 LAB — CBC
HCT: 19.3 % — ABNORMAL LOW (ref 39.0–52.0)
Hemoglobin: 6.4 g/dL — CL (ref 13.0–17.0)
MCH: 29.6 pg (ref 26.0–34.0)
MCHC: 33.2 g/dL (ref 30.0–36.0)
MCV: 89.4 fL (ref 78.0–100.0)
Platelets: 193 10*3/uL (ref 150–400)
RBC: 2.16 MIL/uL — ABNORMAL LOW (ref 4.22–5.81)
RDW: 17.5 % — ABNORMAL HIGH (ref 11.5–15.5)
WBC: 12.5 10*3/uL — ABNORMAL HIGH (ref 4.0–10.5)

## 2015-11-18 LAB — RENAL FUNCTION PANEL
Albumin: 2.5 g/dL — ABNORMAL LOW (ref 3.5–5.0)
Anion gap: 20 — ABNORMAL HIGH (ref 5–15)
BUN: 137 mg/dL — ABNORMAL HIGH (ref 6–20)
CO2: 25 mmol/L (ref 22–32)
Calcium: 8.4 mg/dL — ABNORMAL LOW (ref 8.9–10.3)
Chloride: 93 mmol/L — ABNORMAL LOW (ref 101–111)
Creatinine, Ser: 5.11 mg/dL — ABNORMAL HIGH (ref 0.61–1.24)
GFR calc Af Amer: 11 mL/min — ABNORMAL LOW
GFR calc non Af Amer: 9 mL/min — ABNORMAL LOW
Glucose, Bld: 163 mg/dL — ABNORMAL HIGH (ref 65–99)
Phosphorus: 10.9 mg/dL — ABNORMAL HIGH (ref 2.5–4.6)
Potassium: 4 mmol/L (ref 3.5–5.1)
Sodium: 138 mmol/L (ref 135–145)

## 2015-11-18 LAB — MAGNESIUM: Magnesium: 2.8 mg/dL — ABNORMAL HIGH (ref 1.7–2.4)

## 2015-11-18 LAB — PARATHYROID HORMONE, INTACT (NO CA): PTH: 318 pg/mL — ABNORMAL HIGH (ref 15–65)

## 2015-11-18 LAB — PREPARE RBC (CROSSMATCH)

## 2015-11-18 LAB — GLUCOSE, CAPILLARY
GLUCOSE-CAPILLARY: 170 mg/dL — AB (ref 65–99)
Glucose-Capillary: 172 mg/dL — ABNORMAL HIGH (ref 65–99)
Glucose-Capillary: 137 mg/dL — ABNORMAL HIGH (ref 65–99)

## 2015-11-18 LAB — TROPONIN I: Troponin I: 11.08 ng/mL

## 2015-11-18 MED ORDER — LIDOCAINE-PRILOCAINE 2.5-2.5 % EX CREA
1.0000 "application " | TOPICAL_CREAM | CUTANEOUS | Status: DC | PRN
  Filled 2015-11-18: qty 5

## 2015-11-18 MED ORDER — ALLOPURINOL 300 MG PO TABS
300.0000 mg | ORAL_TABLET | Freq: Every day | ORAL | Status: DC
  Administered 2015-11-19 – 2015-11-26 (×7): 300 mg via ORAL
  Filled 2015-11-18 (×8): qty 1

## 2015-11-18 MED ORDER — SODIUM CHLORIDE 0.9 % IV SOLN: 100.0000 mL | INTRAVENOUS | Status: DC | PRN

## 2015-11-18 MED ORDER — ALTEPLASE 2 MG IJ SOLR
2.0000 mg | Freq: Once | INTRAMUSCULAR | Status: DC | PRN
  Filled 2015-11-18: qty 2

## 2015-11-18 MED ORDER — PENTAFLUOROPROP-TETRAFLUOROETH EX AERO: 1.0000 "application " | INHALATION_SPRAY | CUTANEOUS | Status: DC | PRN

## 2015-11-18 MED ORDER — HEPARIN SODIUM (PORCINE) 1000 UNIT/ML DIALYSIS: 1000.0000 [IU] | INTRAMUSCULAR | Status: DC | PRN

## 2015-11-18 MED ORDER — LIDOCAINE HCL 1 % IJ SOLN
INTRAMUSCULAR | Status: AC
  Filled 2015-11-18: qty 20

## 2015-11-18 MED ORDER — LIDOCAINE-PRILOCAINE 2.5-2.5 % EX CREA: 1.0000 "application " | TOPICAL_CREAM | CUTANEOUS | Status: DC | PRN

## 2015-11-18 MED ORDER — ALBUTEROL SULFATE (2.5 MG/3ML) 0.083% IN NEBU
2.5000 mg | INHALATION_SOLUTION | RESPIRATORY_TRACT | Status: DC | PRN
  Administered 2015-11-19 (×2): 2.5 mg via RESPIRATORY_TRACT
  Filled 2015-11-18 (×2): qty 3

## 2015-11-18 MED ORDER — ALPRAZOLAM 0.25 MG PO TABS
0.2500 mg | ORAL_TABLET | Freq: Once | ORAL | Status: AC
  Administered 2015-11-18: 0.25 mg via ORAL
  Filled 2015-11-18: qty 1

## 2015-11-18 MED ORDER — ALTEPLASE 2 MG IJ SOLR: 2.0000 mg | Freq: Once | INTRAMUSCULAR | Status: DC | PRN

## 2015-11-18 MED ORDER — HEPARIN SODIUM (PORCINE) 1000 UNIT/ML IJ SOLN
INTRAMUSCULAR | Status: AC
  Filled 2015-11-18: qty 1

## 2015-11-18 MED ORDER — SODIUM CHLORIDE 0.9 % IV SOLN: Freq: Once | INTRAVENOUS | Status: DC

## 2015-11-18 MED ORDER — HEPARIN SODIUM (PORCINE) 1000 UNIT/ML DIALYSIS
100.0000 [IU]/kg | INTRAMUSCULAR | Status: DC | PRN
  Filled 2015-11-18: qty 7

## 2015-11-18 MED ORDER — LIDOCAINE HCL (PF) 1 % IJ SOLN: 5.0000 mL | INTRAMUSCULAR | Status: DC | PRN

## 2015-11-18 MED ORDER — HEPARIN SODIUM (PORCINE) 1000 UNIT/ML DIALYSIS: 40.0000 [IU]/kg | Freq: Once | INTRAMUSCULAR | Status: DC

## 2015-11-18 MED ORDER — INSULIN GLARGINE 100 UNIT/ML ~~LOC~~ SOLN
10.0000 [IU] | Freq: Every day | SUBCUTANEOUS | Status: DC
  Administered 2015-11-18 – 2015-11-21 (×3): 10 [IU] via SUBCUTANEOUS
  Filled 2015-11-18 (×5): qty 0.1

## 2015-11-18 NOTE — Progress Notes (Signed)
ABG results given to RN  PH- 7.382 CO2- 41.1 O2- 51.9 HCO3- 24.0

## 2015-11-18 NOTE — Procedures (Signed)
Interventional Radiology Procedure Note  Procedure:  Right IJ non-tunneled CVC  Complications:  None  Estimated Blood Loss: < 10 mL  20 cm Trialysis catheter via right IJ vein to cavoatrial junction.  No PTX.  OK to use.  Jodi Marble. Fredia Sorrow, M.D Pager:  [phone]

## 2015-11-18 NOTE — Progress Notes (Signed)
Subjective: Interval History: has complaints knows needs HD.  SOB.  Objective: Vital signs in last 24 hours: Temp:  [97 F (36.1 C)-97.6 F (36.4 C)] 97 F (36.1 C) (01/20 0700) Pulse Rate:  [66-71] 70 (01/20 0700) Resp:  [17-28] 21 (01/20 0700) BP: (120-139)/(36-81) 137/67 mmHg (01/20 0700) SpO2:  [89 %-100 %] 98 % (01/20 0700) FiO2 (%):  [55 %-60 %] 60 % (01/20 0700) Weight:  [72.5 kg (159 lb 13.3 oz)] 72.5 kg (159 lb 13.3 oz) (01/19 1700) Weight change: -4.897 kg (-10 lb 12.7 oz)  Intake/Output from previous day: 01/19 0701 - 01/20 0700 In: 959 [P.O.:370; I.V.:259; IV Piggyback:330] Out: 350 [Urine:350] Intake/Output this shift:    General appearance: cooperative, mild distress, pale and on BIPAP Resp: diminished breath sounds bilaterally and rales bibasilar Breasts: not eval GI: soft, liver down 5 cm Extremities: edema 2+ and bruits fem  CV reg gr 3/6 sem,   Lab Results:  Recent Labs  11/17/15 0049 11/18/15 0233  WBC 11.6* 12.5*  HGB 6.8* 6.4*  HCT 20.2* 19.3*  PLT 175 193   BMET:  Recent Labs  11/17/15 0049 11/18/15 0233  NA 139 138  K 4.2 4.0  CL 94* 93*  CO2 26 25  GLUCOSE 181* 163*  BUN 122* 137*  CREATININE 4.07* 5.11*  CALCIUM 8.7* 8.4*   No results for input(s): PTH in the last 72 hours. Iron Studies:  Recent Labs  11/16/15 2043  IRON 97  TIBC 235*    Studies/Results: Dg Chest 1 View  11/16/2015  CLINICAL DATA:  Hypoxia and shortness of breath. Status post cardiac catheterization. EXAM: CHEST 1 VIEW COMPARISON:  Film earlier today at 1034 hours FINDINGS: Similar pattern of congestive heart failure with bilateral edema. No significant pleural fluid identified. The heart size is stable and within normal limits. No pneumothorax. IMPRESSION: Similar appearance of CHF compared to the prior study earlier today Electronically Signed   By: Irish Lack M.D.   On: 11/16/2015 18:10   Dg Chest Port 1 View  11/18/2015  CLINICAL DATA:  Dyspnea  EXAM: PORTABLE CHEST 1 VIEW COMPARISON:  11/18/2015 FINDINGS: Progressive bilateral airspace disease right greater than left. No significant effusion. Mild cardiac enlargement. IMPRESSION: Progression of bilateral airspace disease. This may represent pulmonary edema or possibly pneumonia. Electronically Signed   By: Marlan Palau M.D.   On: 11/18/2015 07:19   Dg Chest Port 1 View  11/18/2015  CLINICAL DATA:  82 year old male with shortness of breath EXAM: PORTABLE CHEST 1 VIEW COMPARISON:  Radiograph dated 11/17/2015 FINDINGS: Single-view of the chest demonstrate interval increase in the right mid to lower lung field opacity. There is stable left lung airspace opacity. Small right pleural effusion with small amount of fluid noted in the right minor fissure. No pneumothorax. Stable cardiac silhouette. No acute osseous pathology. IMPRESSION: Interval worsening of the airspace opacity in the right lower lung field. Electronically Signed   By: Elgie Collard M.D.   On: 11/18/2015 00:36   Dg Chest Port 1 View  11/17/2015  CLINICAL DATA:  Shortness of breath. EXAM: PORTABLE CHEST 1 VIEW COMPARISON:  11/16/2015. FINDINGS: Cardiomegaly with persistent unchanged diffuse bilateral pulmonary infiltrates consistent with pulmonary edema. No pleural effusion or pneumothorax. No acute bony abnormality. IMPRESSION: Cardiomegaly with persistent diffuse bilateral pulmonary infiltrates consistent with pulmonary edema. No interim change. Electronically Signed   By: Maisie Fus  Register   On: 11/17/2015 07:18   Dg Chest Portable 1 View  11/16/2015  CLINICAL DATA:  Shortness  of breath and confusion EXAM: PORTABLE CHEST 1 VIEW COMPARISON:  September 21, 2015 FINDINGS: There is moderate generalized interstitial edema and patchy alveolar edema in the right base. Lungs elsewhere clear. Heart is upper normal in size with pulmonary vascular within normal limits. No adenopathy. No pneumothorax. No bone lesions. IMPRESSION: Evidence of a  degree of congestive heart failure. Probable alveolar edema right lower lobe, although a small focus of superimposed pneumonia cannot be excluded radiographically. Electronically Signed   By: Bretta Bang III M.D.   On: 11/16/2015 10:43    I have reviewed the patient's current medications.  Assessment/Plan: 1 CKD4/AKI from [place]. Worsening function and vol xs, needs HD, will get cath and do HD. hopefully told PC 2 Anemia will transfuse at HD 3 HPTH meds 4 HTN lower vol 5 DM contolled 6 PVD 7 ^ lipids P HD, Pc, lower vol and solute, esa, TX    LOS: 2 days   Hibba Schram L 11/18/2015,8:14 AM

## 2015-11-18 NOTE — Progress Notes (Signed)
RT removed patient from bipap and placed on 55% VM. Patient tolerating well at this time. NO complications. Vital signs stable at this time. RT will continue to monitor.

## 2015-11-18 NOTE — Progress Notes (Signed)
Pt has a Rectal temp of 95.9. Dr. Sharon Seller paged.  Reported to Denyse Amass, RN in Dialysis. RN stated speaking to Dr. Darrick Penna and that it is okay to bring pt and will turn up the temp on Dialysis machine.

## 2015-11-18 NOTE — Progress Notes (Signed)
PCCM Interval Progress Note  Called by Pola Corn MD and asked to assess pt at bedside for drop in O2 sats.  82 y.o. M admitted 1/18 for chest pain, found to have NSTEMI.  Later developed pulmonary edema after blood transfusion for Hgb 6.2 and LHC. Was in ICU on BiPAP, transferred to SDU 01/19 with only [place].  He is on high dose lasix for volume overload.  Has not had any UOP this evening.   VITAL SIGNS: Temp:  [97 F (36.1 C)-97.5 F (36.4 C)] 97.5 F (36.4 C) (01/19 1955) Pulse Rate:  [55-71] 67 (01/19 1955) Resp:  [13-24] 21 (01/19 1955) BP: (118-139)/(36-90) 134/60 mmHg (01/19 1955) SpO2:  [89 %-99 %] 89 % (01/19 1955) FiO2 (%):  [55 %] 55 % (01/20 0010) Weight:  [70.4 kg (155 lb 3.3 oz)-72.5 kg (159 lb 13.3 oz)] 72.5 kg (159 lb 13.3 oz) (01/19 1700)  PHYSICAL EXAM: General:  Elderly appearing male, in NAD. Neuro:  A&O x 3. Cardiovascular:  RRR, faint SEM. Lungs:  Mildly increased WOB with intermittent paradoxical respirations, no accessory muscles. BS diminished anterior fields, upper airway expiratory wheeze noted. Abdomen:  BS x 4.  Soft, NT/ND.  Musculoskeletal:  No edema.   IMPRESSION / PLAN:  Acute hypoxic respiratory failure - likely due to pulmonary edema in the setting of volume overload following LHC and blood transfusion (and has hx CHF and CKD).  He responded well to BiPAP and nitro gtt while in ICU. Plan: Add back BiPAP tonight. Assess CXR. Re-assess after BiPAP initiation. Defer ABG for now as clinically, pt appears stable and mentating appropriately. Continue aggressive diuresis per nephrology. RN asked to monitor closely.   Rutherford Guys, [place] [facility] Pgr: [phone]  or [phone] 11/18/2015, 12:19 AM

## 2015-11-18 NOTE — Progress Notes (Addendum)
[place] TEAM 1 - Stepdown/ICU TEAM PROGRESS NOTE  [name] ZOX:[id number] DOB: 11-26-1929 DOA: 11/16/2015 PCP: Lupita Raider, MD  Admit HPI / Brief Narrative: 82 yo M who presented on 1/18 for chest pain found to have NSTEMI s/p LHC with onset of acute pulmonary edema after blood transfusion.  Significant Events: 1/18: Admitted with CP found to have NSTEMI with LHC with no significant blockage.  1/18 Hg 6.2 requiring blood transfusion shortly after which developed respiratory failure  1/18 CXR with pulmonary edema started on IV lasix  1/19 CXR with no change. Minimal diuresis.   HPI/Subjective: The patient is hungry.  He has had some intermittent shortness of breath overnight and has required intermittent use of BiPAP.  Presently on a facemask his oxygenation is varying from 85-98.  He denies chest pain or chest pressure.  He reports some migratory crampy abdominal pain.  Assessment/Plan:  Acute on chronic CKD Stage 4 > 5 Nephrology feels he needs HD - indeed volume overload has proven be refractory - to get HD cath and HD today   Acute pulmonary edema in setting of volume overload (CKD Stage 4 and CHF) Continue IV lasix and metalazone - needs hemodialysis as response to diuretic minimal - requiring BiPAP to maintain saturations  NSTEMI with peak trop 18 LHC with no significant obstructive disease, most likely demand ischemia in setting of acute on chronic anemia w/ small vessel disease - Cardiology has now signed off - continue medical therapy  Acute on chronic grade 2 diastolic CHF TTE 06/08/90 - volume control per dialysis  Acute on chronic anemia of renal disease no active bleeding - continue aranesp Q2 weekly, iron reserves adequate with Tstat 41% - transfuse to goal of 8.0 once HD begun   Hypertension Controlled at present  Hyperlipidemia Continue medical therapy   PVD left iliac artery stenting   Non-tophaceous gout  History of tobacco  abuse  Hyperphosphatemia in setting of CKD Stage 4  Type 2 DM A1c 6.1 09/22/15 - CBG currently reasonably well-controlled  Secondary hyperparathyroidism in setting of CKD 4 Continue calcitriol 0.5 mcg daily   Dementia - Delirium with visual hallucinations  Appears to be slowly improving outside of ICU  Code Status: FULL Family Communication: no family present at time of exam Disposition Plan: SDU  Consultants: Cardiology - [facility]  PCCM  Antibiotics: none  DVT prophylaxis: SCDs  Objective: Blood pressure 137/67, pulse 70, temperature 97 F (36.1 C), temperature source Axillary, resp. rate 21, height  (1.676 m), weight 72.5 kg (159 lb 13.3 oz), SpO2 98 %.  Intake/Output Summary (Last 24 hours) at 11/18/15 0832 Last data filed at 11/18/15 0600  Gross per 24 hour  Intake    881 ml  Output    350 ml  Net    531 ml   Exam: General:intermittent hypoxia, but in no evident resp distress  Lungs: Fine crackles scattered throughout with no wheeze Cardiovascular: Regular rate and rhythm without murmur gallop or rub  Abdomen: Nontender, nondistended, soft, bowel sounds positive, no rebound, no ascites, no appreciable mass Extremities: No significant cyanosis, or clubbing;  trace edema bilateral lower extremities  Data Reviewed: Basic Metabolic Panel:  Recent Labs Lab 11/16/15 1019 11/16/15 1335 11/16/15 2043 11/17/15 0049 11/18/15 0233  NA 139  --  138 139 138  K 2.6*  --  3.6 4.2 4.0  CL 95*  --  91* 94* 93*  CO2 26  --  GLUCOSE 163*  --  178* 181* 163*  BUN 114*  --  120* 122* 137*  CREATININE 3.87*  --  3.96* 4.07* 5.11*  CALCIUM 8.8*  --  8.9 8.7* 8.4*  MG 2.6* 2.6*  --   --  2.8*  PHOS  --   --   --  11.0* 10.9*    CBC:  Recent Labs Lab 11/16/15 1019 11/16/15 2043 11/17/15 0049 11/18/15 0233  WBC 12.1* 12.0* 11.6* 12.5*  HGB 6.2* 7.3* 6.8* 6.4*  HCT 18.5* 21.9* 20.2* 19.3*  MCV 92.5 89.0 90.2 89.4  PLT 160 203 175 193     Liver Function Tests:  Recent Labs Lab 11/16/15 1019 11/17/15 0049 11/18/15 0233  AST 51* 53*  --   ALT 19 20  --   ALKPHOS 79 77  --   BILITOT 1.0 1.4*  --   PROT 5.1* 5.0*  --   ALBUMIN 2.8* 2.6* 2.5*   Coags:  Recent Labs Lab 11/16/15 1019  INR 1.13    Recent Labs Lab 11/16/15 1019  APTT 23*    Cardiac Enzymes:  Recent Labs Lab 11/16/15 1335 11/16/15 2043 11/17/15 0049 11/18/15 0233  TROPONINI 8.35* 9.49* 11.13* 11.08*    CBG:  Recent Labs Lab 11/17/15 0741 11/17/15 1117 11/17/15 1908 11/17/15 2245 11/18/15 0735  GLUCAP 150* 192* 176* 129* 172*    Recent Results (from the past 240 hour(s))  MRSA PCR Screening     Status: None   Collection Time: 11/16/15  7:45 PM  Result Value Ref Range Status   MRSA by PCR NEGATIVE NEGATIVE Final    Comment:        The GeneXpert MRSA Assay (FDA approved for NASAL specimens only), is one component of a comprehensive MRSA colonization surveillance program. It is not intended to diagnose MRSA infection nor to guide or monitor treatment for MRSA infections.      Studies:   Recent x-ray studies have been reviewed in detail by the Attending Physician  Scheduled Meds:  Scheduled Meds: . allopurinol  100 mg Oral Daily  . amLODipine  5 mg Oral QHS  . aspirin EC  81 mg Oral QPM  . atorvastatin  80 mg Oral q1800  . calcitRIOL  0.5 mcg Oral Daily  . clopidogrel  75 mg Oral Daily  . darbepoetin (ARANESP) injection - NON-DIALYSIS  150 mcg Subcutaneous Q Wed-1800  . feeding supplement  1 Container Oral TID BM  . furosemide  160 mg Intravenous 4 times per day  . insulin aspart  0-15 Units Subcutaneous TID WC  . isosorbide mononitrate  60 mg Oral Daily  . lanthanum  1,000 mg Oral TID WC  . linagliptin  5 mg Oral Daily  . metolazone  10 mg Oral Daily  . metoprolol succinate  50 mg Oral QHS  . pantoprazole (PROTONIX) IV  40 mg Intravenous Q24H    Time spent on care of this patient: 35  mins   Mersades Barbaro T , MD   [facility] Office  [phone] Pager - Text Page per Loretha Stapler as per below:  On-Call/Text Page:      [email or web address]      password [email or web address]  If 7PM-7AM, please contact night-coverage [email or web address] Password [email or web address] 11/18/2015, 8:32 AM   LOS: 2 days

## 2015-11-18 NOTE — Care Management Important Message (Signed)
Important Message  Patient Details  Name: [name] MRN: [id number] Date of Birth: February 09, 1930   Medicare Important Message Given:  Yes    Jacqui Headen Abena 11/18/2015, 3:11 PM

## 2015-11-18 NOTE — Procedures (Signed)
I was present at this session.  I have reviewed the session itself and made appropriate changes.  HD via temp R IJcath.  bp 130s 140s.  To get off 3.5 L  Cammi Consalvo L 1/20/20173:23 PM

## 2015-11-19 DIAGNOSIS — E8779 Other fluid overload: Secondary | ICD-10-CM

## 2015-11-19 LAB — CBC
HCT: 26.9 % — ABNORMAL LOW (ref 39.0–52.0)
HEMATOCRIT: 25.1 % — AB (ref 39.0–52.0)
HEMOGLOBIN: 9.1 g/dL — AB (ref 13.0–17.0)
Hemoglobin: 8.8 g/dL — ABNORMAL LOW (ref 13.0–17.0)
MCH: 29.5 pg (ref 26.0–34.0)
MCH: 30.3 pg (ref 26.0–34.0)
MCHC: 33.8 g/dL (ref 30.0–36.0)
MCHC: 35.1 g/dL (ref 30.0–36.0)
MCV: 87.3 fL (ref 78.0–100.0)
MCV: 86.6 fL (ref 78.0–100.0)
Platelets: 203 10*3/uL (ref 150–400)
Platelets: 206 10*3/uL (ref 150–400)
RBC: 3.08 MIL/uL — AB (ref 4.22–5.81)
RBC: 2.9 MIL/uL — ABNORMAL LOW (ref 4.22–5.81)
RDW: 17.6 % — ABNORMAL HIGH (ref 11.5–15.5)
RDW: 17.7 % — AB (ref 11.5–15.5)
WBC: 15.4 10*3/uL — ABNORMAL HIGH (ref 4.0–10.5)
WBC: 14.1 10*3/uL — ABNORMAL HIGH (ref 4.0–10.5)

## 2015-11-19 LAB — BLOOD GAS, ARTERIAL
ACID-BASE DEFICIT: 0.5 mmol/L (ref 0.0–2.0)
Bicarbonate: 24 mEq/L (ref 20.0–24.0)
DRAWN BY: 365271
FIO2: 0.55
O2 SAT: 85.3 %
PATIENT TEMPERATURE: 98.2
TCO2: 25.2 mmol/L (ref 0–100)
pCO2 arterial: 41.1 mmHg (ref 35.0–45.0)
pH, Arterial: 7.382 (ref 7.350–7.450)
pO2, Arterial: 51.9 mmHg — ABNORMAL LOW (ref 80.0–100.0)

## 2015-11-19 LAB — TYPE AND SCREEN
ABO/RH(D): B POS
ANTIBODY SCREEN: NEGATIVE
UNIT DIVISION: 0
Unit division: 0
Unit division: 0

## 2015-11-19 LAB — HEPATITIS C ANTIBODY (REFLEX)

## 2015-11-19 LAB — RENAL FUNCTION PANEL
ALBUMIN: 2.6 g/dL — AB (ref 3.5–5.0)
ALBUMIN: 2.4 g/dL — AB (ref 3.5–5.0)
ANION GAP: 16 — AB (ref 5–15)
Anion gap: 16 — ABNORMAL HIGH (ref 5–15)
BUN: 78 mg/dL — ABNORMAL HIGH (ref 6–20)
BUN: 83 mg/dL — AB (ref 6–20)
CALCIUM: 7.9 mg/dL — AB (ref 8.9–10.3)
CO2: 24 mmol/L (ref 22–32)
CO2: 26 mmol/L (ref 22–32)
Calcium: 7.6 mg/dL — ABNORMAL LOW (ref 8.9–10.3)
Chloride: 100 mmol/L — ABNORMAL LOW (ref 101–111)
Chloride: 97 mmol/L — ABNORMAL LOW (ref 101–111)
Creatinine, Ser: 4.08 mg/dL — ABNORMAL HIGH (ref 0.61–1.24)
Creatinine, Ser: 4.22 mg/dL — ABNORMAL HIGH (ref 0.61–1.24)
GFR calc Af Amer: 14 mL/min — ABNORMAL LOW (ref >60)
GFR calc non Af Amer: 12 mL/min — ABNORMAL LOW (ref >60)
GFR calc non Af Amer: 12 mL/min — ABNORMAL LOW (ref >60)
GFR, EST AFRICAN AMERICAN: 14 mL/min — AB (ref >60)
GLUCOSE: 95 mg/dL (ref 65–99)
Glucose, Bld: 131 mg/dL — ABNORMAL HIGH (ref 65–99)
PHOSPHORUS: 7.6 mg/dL — AB (ref 2.5–4.6)
PHOSPHORUS: 6.9 mg/dL — AB (ref 2.5–4.6)
POTASSIUM: 4.8 mmol/L (ref 3.5–5.1)
POTASSIUM: 4.4 mmol/L (ref 3.5–5.1)
SODIUM: 140 mmol/L (ref 135–145)
SODIUM: 139 mmol/L (ref 135–145)

## 2015-11-19 LAB — GLUCOSE, CAPILLARY
GLUCOSE-CAPILLARY: 129 mg/dL — AB (ref 65–99)
Glucose-Capillary: 85 mg/dL (ref 65–99)
Glucose-Capillary: 102 mg/dL — ABNORMAL HIGH (ref 65–99)
Glucose-Capillary: 76 mg/dL (ref 65–99)

## 2015-11-19 LAB — HEPATITIS B SURFACE ANTIGEN
HEP B S AG: NEGATIVE
HEP B S AG: NEGATIVE

## 2015-11-19 LAB — HEPATITIS B CORE ANTIBODY, IGM: Hep B C IgM: NEGATIVE

## 2015-11-19 LAB — HEPATITIS B CORE ANTIBODY, TOTAL: HEP B C TOTAL AB: NEGATIVE

## 2015-11-19 LAB — HEPATITIS B SURFACE ANTIBODY,QUALITATIVE: Hep B S Ab: NONREACTIVE

## 2015-11-19 LAB — HCV COMMENT:

## 2015-11-19 MED ORDER — METOPROLOL SUCCINATE ER 25 MG PO TB24
25.0000 mg | ORAL_TABLET | Freq: Every day | ORAL | Status: DC
  Administered 2015-11-20 – 2015-11-25 (×6): 25 mg via ORAL
  Filled 2015-11-19 (×8): qty 1

## 2015-11-19 MED ORDER — PANTOPRAZOLE SODIUM 40 MG PO TBEC
40.0000 mg | DELAYED_RELEASE_TABLET | Freq: Every day | ORAL | Status: DC
  Administered 2015-11-20 – 2015-11-26 (×6): 40 mg via ORAL
  Filled 2015-11-19 (×6): qty 1

## 2015-11-19 MED ORDER — HALOPERIDOL LACTATE 5 MG/ML IJ SOLN
2.0000 mg | Freq: Once | INTRAMUSCULAR | Status: AC
  Administered 2015-11-19: 2 mg via INTRAVENOUS
  Filled 2015-11-19: qty 1

## 2015-11-19 MED ORDER — HALOPERIDOL LACTATE 5 MG/ML IJ SOLN
2.0000 mg | Freq: Four times a day (QID) | INTRAMUSCULAR | Status: DC | PRN
  Administered 2015-11-20: 2 mg via INTRAVENOUS
  Filled 2015-11-19: qty 1

## 2015-11-19 MED ORDER — RENA-VITE PO TABS
1.0000 | ORAL_TABLET | Freq: Every day | ORAL | Status: DC
  Administered 2015-11-20 – 2015-11-25 (×6): 1 via ORAL
  Filled 2015-11-19 (×6): qty 1

## 2015-11-19 NOTE — Progress Notes (Signed)
Subjective: Interval History: has no complaint, feels better.  Objective: Vital signs in last 24 hours: Temp:  [95.9 F (35.5 C)-98.5 F (36.9 C)] 98.5 F (36.9 C) (01/21 0313) Pulse Rate:  [68-80] 76 (01/21 0313) Resp:  [15-22] 15 (01/21 0313) BP: (108-170)/(48-88) 139/66 mmHg (01/21 0313) SpO2:  [82 %-97 %] 90 % (01/21 0313) FiO2 (%):  [55 %-60 %] 55 % (01/21 0402) Weight:  [68.3 kg (150 lb 9.2 oz)-70.1 kg (154 lb 8.7 oz)] 68.3 kg (150 lb 9.2 oz) (01/20 1846) Weight change: -0.3 kg (-10.6 oz)  Intake/Output from previous day: 01/20 0701 - 01/21 0700 In: 600 [P.O.:600] Out: 3500  Intake/Output this shift:    General appearance: cooperative and confused Neck: RIJ cath Resp: diminished breath sounds bilaterally, rales bibasilar, rhonchi bibasilar and wheezes bilaterally Cardio: S1, S2 normal and systolic murmur: systolic ejection 3/6, decrescendo at 2nd left intercostal space GI: pos bs, liver down 5 cm Extremities: edema 1+  Lab Results:  Recent Labs  11/18/15 0233 11/19/15 0431  WBC 12.5* 15.4*  HGB 6.4* 9.1*  HCT 19.3* 26.9*  PLT 193 203   BMET:  Recent Labs  11/18/15 0233 11/19/15 0431  NA 138 140  K 4.0 4.8  CL 93* 100*  CO2 25 24  GLUCOSE 163* 131*  BUN 137* 78*  CREATININE 5.11* 4.08*  CALCIUM 8.4* 7.9*    Recent Labs  11/16/15 2043  PTH 318*   Iron Studies:  Recent Labs  11/16/15 2043  IRON 97  TIBC 235*    Studies/Results: Ir Fluoro Guide Cv Line Right  11/18/2015  CLINICAL DATA:  Acute renal failure and need for temporary non tunneled hemodialysis catheter. EXAM: NON-TUNNELED CENTRAL VENOUS CATHETER PLACEMENT WITH ULTRASOUND AND FLUOROSCOPIC GUIDANCE FLUOROSCOPY TIME:  6 seconds. PROCEDURE: The procedure, risks, benefits, and alternatives were explained to the patient. Questions regarding the procedure were encouraged and answered. The patient understands and consents to the procedure. Ultrasound was utilized to confirm patency of the  right internal jugular vein. The right neck and chest were prepped with chlorhexidine in a sterile fashion, and a sterile drape was applied covering the operative field. Maximum barrier sterile technique with sterile gowns and gloves were used for the procedure. Local anesthesia was provided with 1% lidocaine. After creating a small venotomy incision, a 21 gauge needle was advanced into the right internal jugular vein under direct, real-time ultrasound guidance. Ultrasound image documentation was performed. After securing guidewire access the venotomy was dilated. A 20 cm length, 13 French Trialysis catheter was advanced Final catheter positioning was confirmed and documented with a fluoroscopic spot image. The catheter was aspirated, flushed with saline, and injected with appropriate volume heparin dwells. The catheter exit site was secured with 0-Prolene retention sutures. COMPLICATIONS: None.  No pneumothorax. FINDINGS: After catheter placement, the tip lies at the cavoatrial junction. The catheter aspirates normally and is ready for immediate use. IMPRESSION: Placement of non-tunneled central venous Trialysis catheter via the right internal jugular vein. The catheter tip lies at the cavoatrial junction. The catheter is ready for immediate use. Electronically Signed   By: Irish Lack M.D.   On: 11/18/2015 17:31   Ir US Guide Vasc Access Right  11/18/2015  CLINICAL DATA:  Acute renal failure and need for temporary non tunneled hemodialysis catheter. EXAM: NON-TUNNELED CENTRAL VENOUS CATHETER PLACEMENT WITH ULTRASOUND AND FLUOROSCOPIC GUIDANCE FLUOROSCOPY TIME:  6 seconds. PROCEDURE: The procedure, risks, benefits, and alternatives were explained to the patient. Questions regarding the procedure were encouraged and  answered. The patient understands and consents to the procedure. Ultrasound was utilized to confirm patency of the right internal jugular vein. The right neck and chest were prepped with  chlorhexidine in a sterile fashion, and a sterile drape was applied covering the operative field. Maximum barrier sterile technique with sterile gowns and gloves were used for the procedure. Local anesthesia was provided with 1% lidocaine. After creating a small venotomy incision, a 21 gauge needle was advanced into the right internal jugular vein under direct, real-time ultrasound guidance. Ultrasound image documentation was performed. After securing guidewire access the venotomy was dilated. A 20 cm length, 13 French Trialysis catheter was advanced Final catheter positioning was confirmed and documented with a fluoroscopic spot image. The catheter was aspirated, flushed with saline, and injected with appropriate volume heparin dwells. The catheter exit site was secured with 0-Prolene retention sutures. COMPLICATIONS: None.  No pneumothorax. FINDINGS: After catheter placement, the tip lies at the cavoatrial junction. The catheter aspirates normally and is ready for immediate use. IMPRESSION: Placement of non-tunneled central venous Trialysis catheter via the right internal jugular vein. The catheter tip lies at the cavoatrial junction. The catheter is ready for immediate use. Electronically Signed   By: Irish Lack M.D.   On: 11/18/2015 17:31   Dg Chest Port 1 View  11/18/2015  CLINICAL DATA:  Dyspnea EXAM: PORTABLE CHEST 1 VIEW COMPARISON:  11/18/2015 FINDINGS: Progressive bilateral airspace disease right greater than left. No significant effusion. Mild cardiac enlargement. IMPRESSION: Progression of bilateral airspace disease. This may represent pulmonary edema or possibly pneumonia. Electronically Signed   By: Marlan Palau M.D.   On: 11/18/2015 07:19   Dg Chest Port 1 View  11/18/2015  CLINICAL DATA:  82 year old male with shortness of breath EXAM: PORTABLE CHEST 1 VIEW COMPARISON:  Radiograph dated 11/17/2015 FINDINGS: Single-view of the chest demonstrate interval increase in the right mid to lower  lung field opacity. There is stable left lung airspace opacity. Small right pleural effusion with small amount of fluid noted in the right minor fissure. No pneumothorax. Stable cardiac silhouette. No acute osseous pathology. IMPRESSION: Interval worsening of the airspace opacity in the right lower lung field. Electronically Signed   By: Elgie Collard M.D.   On: 11/18/2015 00:36    I have reviewed the patient's current medications.  Assessment/Plan: 1 CKD4-5, AKI much better solute and vol. Will do Hd today.  Will need PC and perm access this admit 2 Anemia post transfusion Fe ok, on Aranesp 3 HPTH on vit D 4 Gout cont Allopurinol 5 Confusion slightly worse than baseline 6 CP resolved 7 PVD 8 Copd P Hd, VVS to see, map,    LOS: 3 days   Konor Noren L 11/19/2015,7:38 AM

## 2015-11-19 NOTE — Procedures (Signed)
I was present at this session.  I have reviewed the session itself and made appropriate changes.  HD via temp cath, bp 110-130.    [name] 1/21/20178:36 AM

## 2015-11-19 NOTE — Progress Notes (Signed)
I talked with family at bedside this evening. They stated at this time that they would only like the following people to be able to visit. Rosita Kea, [address] Paulino Dearing, Nelva Bush [place], Katelyn Morrice, and [place].

## 2015-11-19 NOTE — Plan of Care (Signed)
Problem: Education: Goal: Knowledge of [facility] Education information/materials will improve Outcome: Not Met (add Reason) Family is able to understand [facility] and procedures. Pt is not able to state pain or needs adequately.

## 2015-11-19 NOTE — Progress Notes (Signed)
Called to set up bipap in dialysis.  When finished transported down to step down

## 2015-11-19 NOTE — Progress Notes (Signed)
Pt very aggressive towards staff and family this evening while becoming more confused. Pt states that he is in his car and ready to go home. Pt is not able to be reoriented at this time. One time order for  of haldol given at 1919 with little relief.

## 2015-11-19 NOTE — Progress Notes (Signed)
[name]  [name] ZOX:[id number] DOB: 07-26-1930 DOA: 11/16/2015 PCP: Bryan Raider, MD  Admit HPI / Brief Narrative: 82 yo M who presented on 1/18 for chest pain found to have NSTEMI s/p LHC with onset of acute pulmonary edema after blood transfusion.  Significant Events: 1/18: Admitted with CP found to have NSTEMI with LHC with no significant blockage.  1/18 Hg 6.2 requiring blood transfusion shortly after which developed respiratory failure  1/18 CXR with pulmonary edema started on IV lasix  1/19 CXR with no change. Minimal diuresis.   HPI/Subjective: The patient is on BiPAP in his room.  He appears more confused today than yesterday.  He does not appear to be in acute distress otherwise.  Assessment/Plan:  Acute on chronic CKD Stage 4 > 5 HD ongoing per Nephrolgoy   Acute pulmonary edema in setting of volume overload (CKD Stage 4 and CHF) Volume control per HD - requiring BiPAP to maintain saturations - wean from BIPAP as able   NSTEMI with peak trop 18 LHC with no significant obstructive disease, most likely demand ischemia in setting of acute on chronic anemia w/ small vessel disease - Cardiology has now signed off - continue medical therapy  Acute on chronic grade 2 diastolic CHF TTE 06/08/90 - volume control per dialysis - had HD again today   Acute on chronic anemia of renal disease no active bleeding - transfused 1U PRBC thus far   Hypertension Controlled at present  Hyperlipidemia Continue medical therapy   PVD left iliac artery stenting   Non-tophaceous gout  History of tobacco abuse  Hyperphosphatemia in setting of CKD Stage 4  Type 2 DM A1c 6.1 09/22/15 - CBG currently well-controlled  Secondary hyperparathyroidism in setting of CKD 4 Continue calcitriol 0.5 mcg daily   Dementia - Delirium with visual hallucinations  Confused again today - wean off BIPAP as able   Code Status: FULL Family  Communication: no family present at time of exam Disposition Plan: SDU - attempt to wean off BIPAP this evening   Consultants: Cardiology - [facility]  PCCM  Antibiotics: none  DVT prophylaxis: SCDs  Objective: Blood pressure 127/90, pulse 84, temperature 97.4 F (36.3 C), temperature source Axillary, resp. rate 18, height  (1.676 m), weight 66 kg (145 lb 8.1 oz), SpO2 96 %.  Intake/Output Summary (Last 24 hours) at 11/19/15 1455 Last data filed at 11/19/15 1126  Gross per 24 hour  Intake    240 ml  Output   5800 ml  Net  -5560 ml   Exam: General: no evident resp distress - calm on BIPAP - confused  Lungs: improved air movement th/o - no wheeze  Cardiovascular: Regular rate and rhythm - no murmur gallop or rub  Abdomen: Nontender, nondistended, soft, bowel sounds positive, no rebound, no ascites, no appreciable mass Extremities: No significant cyanosis, or clubbing;  trace edema bilateral lower extremities persists   Data Reviewed: Basic Metabolic Panel:  Recent Labs Lab 11/16/15 1019 11/16/15 1335 11/16/15 2043 11/17/15 0049 11/18/15 0233 11/19/15 0431 11/19/15 0855  NA 139  --  138 139 138 140 139  K 2.6*  --  3.6 4.2 4.0 4.8 4.4  CL 95*  --  91* 94* 93* 100* 97*  CO2 26  --  GLUCOSE 163*  --  178* 181* 163* 131* 95  BUN 114*  --  120* 122* 137* 78* 83*  CREATININE 3.87*  --  3.96* 4.07* 5.11* 4.08* 4.22*  CALCIUM 8.8*  --  8.9 8.7* 8.4* 7.9* 7.6*  MG 2.6* 2.6*  --   --  2.8*  --   --   PHOS  --   --   --  11.0* 10.9* 7.6* 6.9*    CBC:  Recent Labs Lab 11/16/15 2043 11/17/15 0049 11/18/15 0233 11/19/15 0431 11/19/15 0855  WBC 12.0* 11.6* 12.5* 15.4* 14.1*  HGB 7.3* 6.8* 6.4* 9.1* 8.8*  HCT 21.9*  22.0* 20.2* 19.3* 26.9* 25.1*  MCV 89.0 90.2 89.4 87.3 86.6  PLT 203 175 193 203 206    Liver Function Tests:  Recent Labs Lab 11/16/15 1019 11/17/15 0049 11/18/15 0233 11/19/15 0431 11/19/15 0855  AST 51* 53*  --    --   --   ALT 19 20  --   --   --   ALKPHOS 79 77  --   --   --   BILITOT 1.0 1.4*  --   --   --   PROT 5.1* 5.0*  --   --   --   ALBUMIN 2.8* 2.6* 2.5* 2.6* 2.4*   Coags:  Recent Labs Lab 11/16/15 1019  INR 1.13    Recent Labs Lab 11/16/15 1019  APTT 23*    Cardiac Enzymes:  Recent Labs Lab 11/16/15 1335 11/16/15 2043 11/17/15 0049 11/18/15 0233  TROPONINI 8.35* 9.49* 11.13* 11.08*    CBG:  Recent Labs Lab 11/18/15 0735 11/18/15 1143 11/18/15 2113 11/19/15 0738 11/19/15 1201  GLUCAP 172* 170* 137* 129* 85    Recent Results (from the past 240 hour(s))  MRSA PCR Screening     Status: None   Collection Time: 11/16/15  7:45 PM  Result Value Ref Range Status   MRSA by PCR NEGATIVE NEGATIVE Final    Comment:        The GeneXpert MRSA Assay (FDA approved for NASAL specimens only), is one component of a comprehensive MRSA colonization surveillance program. It is not intended to diagnose MRSA infection nor to guide or monitor treatment for MRSA infections.      Studies:   Recent x-ray studies have been reviewed in detail by the Attending Physician  Scheduled Meds:  Scheduled Meds: . sodium chloride   Intravenous Once  . allopurinol  300 mg Oral Daily  . aspirin EC  81 mg Oral QPM  . atorvastatin  80 mg Oral q1800  . calcitRIOL  0.5 mcg Oral Daily  . clopidogrel  75 mg Oral Daily  . darbepoetin (ARANESP) injection - NON-DIALYSIS  150 mcg Subcutaneous Q Wed-1800  . feeding supplement  1 Container Oral TID BM  . insulin aspart  0-15 Units Subcutaneous TID WC  . insulin glargine  10 Units Subcutaneous QHS  . isosorbide mononitrate  60 mg Oral Daily  . lanthanum  1,000 mg Oral TID WC  . metoprolol succinate  25 mg Oral QHS  . multivitamin  1 tablet Oral QHS  . pantoprazole (PROTONIX) IV  40 mg Intravenous Q24H    Time spent on care of this patient: 35 mins   [name] , MD   [facility] Office  [phone] Pager - Text  Page per [name] as per below:  On-Call/Text Page:      [name]      password [email or web address]  If 7PM-7AM, please contact night-coverage [email or web address] Password [email or web address] 11/19/2015, 2:55 PM   LOS: 3 days

## 2015-11-20 LAB — RENAL FUNCTION PANEL
ANION GAP: 12 (ref 5–15)
Albumin: 2.3 g/dL — ABNORMAL LOW (ref 3.5–5.0)
BUN: 54 mg/dL — ABNORMAL HIGH (ref 6–20)
CALCIUM: 8.1 mg/dL — AB (ref 8.9–10.3)
CHLORIDE: 103 mmol/L (ref 101–111)
CO2: 26 mmol/L (ref 22–32)
CREATININE: 3.98 mg/dL — AB (ref 0.61–1.24)
GFR, EST AFRICAN AMERICAN: 15 mL/min — AB (ref >60)
GFR, EST NON AFRICAN AMERICAN: 13 mL/min — AB (ref >60)
Glucose, Bld: 71 mg/dL (ref 65–99)
Phosphorus: 5.8 mg/dL — ABNORMAL HIGH (ref 2.5–4.6)
Potassium: 4.4 mmol/L (ref 3.5–5.1)
SODIUM: 141 mmol/L (ref 135–145)

## 2015-11-20 LAB — GLUCOSE, CAPILLARY
GLUCOSE-CAPILLARY: 69 mg/dL (ref 65–99)
GLUCOSE-CAPILLARY: 168 mg/dL — AB (ref 65–99)
GLUCOSE-CAPILLARY: 87 mg/dL (ref 65–99)
GLUCOSE-CAPILLARY: 305 mg/dL — AB (ref 65–99)

## 2015-11-20 LAB — CBC
HCT: 26.4 % — ABNORMAL LOW (ref 39.0–52.0)
HEMOGLOBIN: 9.1 g/dL — AB (ref 13.0–17.0)
MCH: 30.6 pg (ref 26.0–34.0)
MCHC: 34.5 g/dL (ref 30.0–36.0)
MCV: 88.9 fL (ref 78.0–100.0)
PLATELETS: 207 10*3/uL (ref 150–400)
RBC: 2.97 MIL/uL — AB (ref 4.22–5.81)
RDW: 17.6 % — ABNORMAL HIGH (ref 11.5–15.5)
WBC: 13.1 10*3/uL — AB (ref 4.0–10.5)

## 2015-11-20 MED ORDER — CYANOCOBALAMIN 1000 MCG/ML IJ SOLN
1000.0000 ug | Freq: Every day | INTRAMUSCULAR | Status: DC
  Administered 2015-11-20 – 2015-11-25 (×6): 1000 ug via SUBCUTANEOUS
  Filled 2015-11-20 (×7): qty 1

## 2015-11-20 MED ORDER — QUETIAPINE FUMARATE 25 MG PO TABS
12.5000 mg | ORAL_TABLET | Freq: Every day | ORAL | Status: DC
  Administered 2015-11-20 – 2015-11-25 (×6): 12.5 mg via ORAL
  Filled 2015-11-20 (×6): qty 1

## 2015-11-20 NOTE — Progress Notes (Signed)
Rn called requesting Haldol .  Pt required last pm as well.   Pt did well until 12:30am and he became agitated again.  Pt given second dosage.   Pt has been fine during the day.

## 2015-11-20 NOTE — Progress Notes (Signed)
Subjective: Interval History: has no complaint, coop.  Objective: Vital signs in last 24 hours: Temp:  [97.4 F (36.3 C)-98.6 F (37 C)] 98.5 F (36.9 C) (01/22 0400) Pulse Rate:  [38-103] 90 (01/22 0423) Resp:  [14-25] 25 (01/22 0423) BP: (88-159)/(47-90) 128/49 mmHg (01/22 0400) SpO2:  [90 %-98 %] 97 % (01/22 0423) FiO2 (%):  [50 %-55 %] 55 % (01/21 2028) Weight:  [66 kg (145 lb 8.1 oz)-68.3 kg (150 lb 9.2 oz)] 66 kg (145 lb 8.1 oz) (01/21 1126) Weight change: -1.8 kg (-3 lb 15.5 oz)  Intake/Output from previous day: 01/21 0701 - 01/22 0700 In: 120 [P.O.:120] Out: 2300  Intake/Output this shift:    General appearance: cooperative, no distress, pale, slowed mentation and less agitated, more appropriate Neck: RIJ cath Resp: diminished breath sounds bilaterally and rhonchi bibasilar Cardio: S1, S2 normal and systolic murmur: holosystolic 2/6, blowing at apex GI: pos bs, liver down 5 cm, soft Extremities: edema 1-2 +  Lab Results:  Recent Labs  11/19/15 0855 11/20/15 0224  WBC 14.1* 13.1*  HGB 8.8* 9.1*  HCT 25.1* 26.4*  PLT 206 207   BMET:  Recent Labs  11/19/15 0855 11/20/15 0224  NA 139 141  K 4.4 4.4  CL 97* 103  CO2 26 26  GLUCOSE 95 71  BUN 83* 54*  CREATININE 4.22* 3.98*  CALCIUM 7.6* 8.1*   No results for input(s): PTH in the last 72 hours. Iron Studies: No results for input(s): IRON, TIBC, TRANSFERRIN, FERRITIN in the last 72 hours.  Studies/Results: Ir Fluoro Guide Cv Line Right  11/18/2015  CLINICAL DATA:  Acute renal failure and need for temporary non tunneled hemodialysis catheter. EXAM: NON-TUNNELED CENTRAL VENOUS CATHETER PLACEMENT WITH ULTRASOUND AND FLUOROSCOPIC GUIDANCE FLUOROSCOPY TIME:  6 seconds. PROCEDURE: The procedure, risks, benefits, and alternatives were explained to the patient. Questions regarding the procedure were encouraged and answered. The patient understands and consents to the procedure. Ultrasound was utilized to confirm  patency of the right internal jugular vein. The right neck and chest were prepped with chlorhexidine in a sterile fashion, and a sterile drape was applied covering the operative field. Maximum barrier sterile technique with sterile gowns and gloves were used for the procedure. Local anesthesia was provided with 1% lidocaine. After creating a small venotomy incision, a 21 gauge needle was advanced into the right internal jugular vein under direct, real-time ultrasound guidance. Ultrasound image documentation was performed. After securing guidewire access the venotomy was dilated. A 20 cm length, 13 French Trialysis catheter was advanced Final catheter positioning was confirmed and documented with a fluoroscopic spot image. The catheter was aspirated, flushed with saline, and injected with appropriate volume heparin dwells. The catheter exit site was secured with 0-Prolene retention sutures. COMPLICATIONS: None.  No pneumothorax. FINDINGS: After catheter placement, the tip lies at the cavoatrial junction. The catheter aspirates normally and is ready for immediate use. IMPRESSION: Placement of non-tunneled central venous Trialysis catheter via the right internal jugular vein. The catheter tip lies at the cavoatrial junction. The catheter is ready for immediate use. Electronically Signed   By: Irish Lack M.D.   On: 11/18/2015 17:31   Ir US Guide Vasc Access Right  11/18/2015  CLINICAL DATA:  Acute renal failure and need for temporary non tunneled hemodialysis catheter. EXAM: NON-TUNNELED CENTRAL VENOUS CATHETER PLACEMENT WITH ULTRASOUND AND FLUOROSCOPIC GUIDANCE FLUOROSCOPY TIME:  6 seconds. PROCEDURE: The procedure, risks, benefits, and alternatives were explained to the patient. Questions regarding the procedure were encouraged  and answered. The patient understands and consents to the procedure. Ultrasound was utilized to confirm patency of the right internal jugular vein. The right neck and chest were prepped  with chlorhexidine in a sterile fashion, and a sterile drape was applied covering the operative field. Maximum barrier sterile technique with sterile gowns and gloves were used for the procedure. Local anesthesia was provided with 1% lidocaine. After creating a small venotomy incision, a 21 gauge needle was advanced into the right internal jugular vein under direct, real-time ultrasound guidance. Ultrasound image documentation was performed. After securing guidewire access the venotomy was dilated. A 20 cm length, 13 French Trialysis catheter was advanced Final catheter positioning was confirmed and documented with a fluoroscopic spot image. The catheter was aspirated, flushed with saline, and injected with appropriate volume heparin dwells. The catheter exit site was secured with 0-Prolene retention sutures. COMPLICATIONS: None.  No pneumothorax. FINDINGS: After catheter placement, the tip lies at the cavoatrial junction. The catheter aspirates normally and is ready for immediate use. IMPRESSION: Placement of non-tunneled central venous Trialysis catheter via the right internal jugular vein. The catheter tip lies at the cavoatrial junction. The catheter is ready for immediate use. Electronically Signed   By: Irish Lack M.D.   On: 11/18/2015 17:31    I have reviewed the patient's current medications.  Assessment/Plan: 1 CKD/AKI now ESRD will do HD in am.  If comes around, place access and PC 2 Confusion underlying dementia and acute illness 3 PVD 4 Anemia esa, Fe ok 5 HPTH vit D 6 DM controlled 7 COPD P HD in am, access if MS improves, discuss with daughter, esa, vit D    LOS: 4 days   Zuriel Roskos L 11/20/2015,7:10 AM

## 2015-11-20 NOTE — Progress Notes (Signed)
[place] TEAM 1 - Stepdown/ICU TEAM PROGRESS NOTE  [name] ZOX:[id number] DOB: 10/18/1930 DOA: 11/16/2015 PCP: Lupita Raider, MD  Admit HPI / Brief Narrative: 82 yo M who presented on 1/18 for chest pain found to have NSTEMI s/p LHC with onset of acute pulmonary edema after blood transfusion.  Significant Events: 1/18: Admitted with CP found to have NSTEMI with LHC with no significant blockage.  1/18 Hg 6.2 requiring blood transfusion shortly after which developed respiratory failure  1/18 CXR with pulmonary edema started on IV lasix  1/19 CXR with no change. Minimal diuresis.   HPI/Subjective: The patient became agitated last night and required dosing with Haldol.  This morning he is much more alert and interactive than he was yesterday.  He does not appear to be in acute respiratory distress at this time.  He is presently on facemask oxygen and not requiring BiPAP.  He denies chest pain or shortness of breath.  Assessment/Plan:  Acute on chronic CKD Stage 4 > 5 HD ongoing per Nephrolgoy - next treatment scheduled for tomorrow  Acute pulmonary edema in setting of volume overload (CKD Stage 4 and CHF) Volume control per HD - saturations improved with patient now weaned from BiPAP   NSTEMI with peak trop 18 LHC with no significant obstructive disease, most likely demand ischemia in setting of acute on chronic anemia w/ small vessel disease - Cardiology has now signed off - continue medical therapy  Acute on chronic grade 2 diastolic CHF TTE 06/08/90 - volume control per dialysis - net negative approximately 3.7 L thus far  Acute on chronic anemia of renal disease no active bleeding - transfused 4U PRBC thus far - hemoglobin holding steady at approximately 9  Hypertension Controlled at present  Hyperlipidemia Continue medical therapy   PVD left iliac artery stenting   Non-tophaceous gout Presently quiescent  History of tobacco abuse  Hyperphosphatemia in  setting of CKD Stage 4  Type 2 DM A1c 6.1 09/22/15 - CBG currently well-controlled  Secondary hyperparathyroidism in setting of CKD 4 Continue calcitriol 0.5 mcg daily   Dementia - Delirium with visual hallucinations  Appears to be responding well to haldol - give trial of scheduled seroquel QHS  Code Status: FULL Family Communication: no family present at time of exam Disposition Plan: SDU   Consultants: Cardiology - [facility]  PCCM  Antibiotics: none  DVT prophylaxis: SCDs  Objective: Blood pressure 134/41, pulse 89, temperature 98.1 F (36.7 C), temperature source Axillary, resp. rate 11, height  (1.676 m), weight 66 kg (145 lb 8.1 oz), SpO2 97 %.  Intake/Output Summary (Last 24 hours) at 11/20/15 1034 Last data filed at 11/19/15 1900  Gross per 24 hour  Intake    120 ml  Output   2300 ml  Net  -2180 ml   Exam: General: no evident resp distress - alert Lungs: no wheezing - no focal crackles Cardiovascular: regular rate and rhythm - no murmur gallop or rub  Abdomen: nontender, nondistended, soft, bowel sounds positive, no rebound, no ascites, no appreciable mass Extremities: no significant cyanosis, clubbing, edema bilateral lower extremities persists   Data Reviewed: Basic Metabolic Panel:  Recent Labs Lab 11/16/15 1019 11/16/15 1335  11/17/15 0049 11/18/15 0233 11/19/15 0431 11/19/15 0855 11/20/15 0224  NA 139  --   < > 139 138 140 139 141  K 2.6*  --   < > 4.2 4.0 4.8 4.4 4.4  CL 95*  --   < > 94* 93*  100* 97* 103  CO2 26  --   < > GLUCOSE 163*  --   < > 181* 163* 131* 95 71  BUN 114*  --   < > 122* 137* 78* 83* 54*  CREATININE 3.87*  --   < > 4.07* 5.11* 4.08* 4.22* 3.98*  CALCIUM 8.8*  --   < > 8.7* 8.4* 7.9* 7.6* 8.1*  MG 2.6* 2.6*  --   --  2.8*  --   --   --   PHOS  --   --   --  11.0* 10.9* 7.6* 6.9* 5.8*  < > = values in this interval not displayed.  CBC:  Recent Labs Lab 11/17/15 0049 11/18/15 0233  11/19/15 0431 11/19/15 0855 11/20/15 0224  WBC 11.6* 12.5* 15.4* 14.1* 13.1*  HGB 6.8* 6.4* 9.1* 8.8* 9.1*  HCT 20.2* 19.3* 26.9* 25.1* 26.4*  MCV 90.2 89.4 87.3 86.6 88.9  PLT 175 193 203 206 207    Liver Function Tests:  Recent Labs Lab 11/16/15 1019 11/17/15 0049 11/18/15 0233 11/19/15 0431 11/19/15 0855 11/20/15 0224  AST 51* 53*  --   --   --   --   ALT 19 20  --   --   --   --   ALKPHOS 79 77  --   --   --   --   BILITOT 1.0 1.4*  --   --   --   --   PROT 5.1* 5.0*  --   --   --   --   ALBUMIN 2.8* 2.6* 2.5* 2.6* 2.4* 2.3*   Coags:  Recent Labs Lab 11/16/15 1019  INR 1.13    Recent Labs Lab 11/16/15 1019  APTT 23*    Cardiac Enzymes:  Recent Labs Lab 11/16/15 1335 11/16/15 2043 11/17/15 0049 11/18/15 0233  TROPONINI 8.35* 9.49* 11.13* 11.08*    CBG:  Recent Labs Lab 11/19/15 0738 11/19/15 1201 11/19/15 1617 11/19/15 2227 11/20/15 0912  GLUCAP 129* 85 102* 76 69    Recent Results (from the past 240 hour(s))  MRSA PCR Screening     Status: None   Collection Time: 11/16/15  7:45 PM  Result Value Ref Range Status   MRSA by PCR NEGATIVE NEGATIVE Final    Comment:        The GeneXpert MRSA Assay (FDA approved for NASAL specimens only), is one component of a comprehensive MRSA colonization surveillance program. It is not intended to diagnose MRSA infection nor to guide or monitor treatment for MRSA infections.      Studies:   Recent x-ray studies have been reviewed in detail by the Attending Physician  Scheduled Meds:  Scheduled Meds: . allopurinol  300 mg Oral Daily  . aspirin EC  81 mg Oral QPM  . atorvastatin  80 mg Oral q1800  . calcitRIOL  0.5 mcg Oral Daily  . clopidogrel  75 mg Oral Daily  . darbepoetin (ARANESP) injection - NON-DIALYSIS  150 mcg Subcutaneous Q Wed-1800  . feeding supplement  1 Container Oral TID BM  . insulin aspart  0-15 Units Subcutaneous TID WC  . insulin glargine  10 Units Subcutaneous QHS    . isosorbide mononitrate  60 mg Oral Daily  . lanthanum  1,000 mg Oral TID WC  . metoprolol succinate  25 mg Oral QHS  . multivitamin  1 tablet Oral QHS  . pantoprazole  40 mg Oral Q1200    Time spent  on care of this patient: 35 mins   MCCLUNG,JEFFREY T , MD   [facility] Office  [phone] Pager - Text Page per Loretha Stapler as per below:  On-Call/Text Page:      [email or web address]      password [email or web address]  If 7PM-7AM, please contact night-coverage [email or web address] Password [facility] 11/20/2015, 10:34 AM   LOS: 4 days

## 2015-11-21 ENCOUNTER — Inpatient Hospital Stay (HOSPITAL_COMMUNITY): Payer: Medicare Other

## 2015-11-21 DIAGNOSIS — N183 Chronic kidney disease, stage 3 (moderate): Secondary | ICD-10-CM

## 2015-11-21 LAB — RENAL FUNCTION PANEL
ANION GAP: 16 — AB (ref 5–15)
Albumin: 2.2 g/dL — ABNORMAL LOW (ref 3.5–5.0)
BUN: 78 mg/dL — ABNORMAL HIGH (ref 6–20)
CALCIUM: 8.3 mg/dL — AB (ref 8.9–10.3)
CO2: 24 mmol/L (ref 22–32)
Chloride: 99 mmol/L — ABNORMAL LOW (ref 101–111)
Creatinine, Ser: 5.15 mg/dL — ABNORMAL HIGH (ref 0.61–1.24)
GFR calc non Af Amer: 9 mL/min — ABNORMAL LOW (ref >60)
GFR, EST AFRICAN AMERICAN: 11 mL/min — AB (ref >60)
Glucose, Bld: 115 mg/dL — ABNORMAL HIGH (ref 65–99)
PHOSPHORUS: 5.6 mg/dL — AB (ref 2.5–4.6)
Potassium: 3.9 mmol/L (ref 3.5–5.1)
SODIUM: 139 mmol/L (ref 135–145)

## 2015-11-21 LAB — CBC
HCT: 26.7 % — ABNORMAL LOW (ref 39.0–52.0)
HEMOGLOBIN: 9.1 g/dL — AB (ref 13.0–17.0)
MCH: 30.3 pg (ref 26.0–34.0)
MCHC: 34.1 g/dL (ref 30.0–36.0)
MCV: 89 fL (ref 78.0–100.0)
Platelets: 222 10*3/uL (ref 150–400)
RBC: 3 MIL/uL — AB (ref 4.22–5.81)
RDW: 16.8 % — ABNORMAL HIGH (ref 11.5–15.5)
WBC: 12.6 10*3/uL — ABNORMAL HIGH (ref 4.0–10.5)

## 2015-11-21 LAB — AMMONIA: Ammonia: 27 umol/L (ref 9–35)

## 2015-11-21 LAB — GLUCOSE, CAPILLARY
GLUCOSE-CAPILLARY: 192 mg/dL — AB (ref 65–99)
GLUCOSE-CAPILLARY: 202 mg/dL — AB (ref 65–99)
GLUCOSE-CAPILLARY: 117 mg/dL — AB (ref 65–99)

## 2015-11-21 LAB — RPR: RPR: NONREACTIVE

## 2015-11-21 MED ORDER — DEXTROSE 5 % IV SOLN: 1.5000 g | INTRAVENOUS | Status: DC

## 2015-11-21 MED ORDER — SODIUM CHLORIDE 0.9 % IV SOLN: INTRAVENOUS | Status: DC

## 2015-11-21 MED ORDER — CETYLPYRIDINIUM CHLORIDE 0.05 % MT LIQD
7.0000 mL | Freq: Two times a day (BID) | OROMUCOSAL | Status: DC
  Administered 2015-11-21 – 2015-11-26 (×8): 7 mL via OROMUCOSAL

## 2015-11-21 MED ORDER — CHLORHEXIDINE GLUCONATE CLOTH 2 % EX PADS: 6.0000 | MEDICATED_PAD | Freq: Once | CUTANEOUS | Status: DC

## 2015-11-21 MED ORDER — HEPARIN SODIUM (PORCINE) 1000 UNIT/ML DIALYSIS: 1000.0000 [IU] | INTRAMUSCULAR | Status: DC | PRN

## 2015-11-21 MED ORDER — PENTAFLUOROPROP-TETRAFLUOROETH EX AERO: 1.0000 "application " | INHALATION_SPRAY | CUTANEOUS | Status: DC | PRN

## 2015-11-21 MED ORDER — ALTEPLASE 2 MG IJ SOLR
2.0000 mg | Freq: Once | INTRAMUSCULAR | Status: DC | PRN
  Filled 2015-11-21: qty 2

## 2015-11-21 MED ORDER — LIDOCAINE HCL (PF) 1 % IJ SOLN: 5.0000 mL | INTRAMUSCULAR | Status: DC | PRN

## 2015-11-21 MED ORDER — SODIUM CHLORIDE 0.9 % IV SOLN: 100.0000 mL | INTRAVENOUS | Status: DC | PRN

## 2015-11-21 MED ORDER — LIDOCAINE-PRILOCAINE 2.5-2.5 % EX CREA: 1.0000 "application " | TOPICAL_CREAM | CUTANEOUS | Status: DC | PRN

## 2015-11-21 MED ORDER — HEPARIN SODIUM (PORCINE) 1000 UNIT/ML DIALYSIS: 100.0000 [IU]/kg | INTRAMUSCULAR | Status: DC | PRN

## 2015-11-21 NOTE — Progress Notes (Signed)
[place] TEAM 1 - Stepdown/ICU TEAM PROGRESS NOTE  [name] ZOX:[id number] DOB: 1930/04/27 DOA: 11/16/2015 PCP: Lupita Raider, MD  Admit HPI / Brief Narrative: 82 yo M who presented on 1/18 for chest pain found to have NSTEMI s/p LHC with onset of acute pulmonary edema after blood transfusion.  Significant Events: 1/18: Admitted with CP found to have NSTEMI with LHC with no significant blockage.  1/18 Hg 6.2 requiring blood transfusion shortly after which developed respiratory failure  1/18 CXR with pulmonary edema started on IV lasix  1/19 CXR with no change. Minimal diuresis.   HPI/Subjective: The patient is much more alert today.  He can answer some questions.  He is not fully oriented but certainly improved.  He complains of shortness of breath but his respiratory status actually appears much improved today compared to the last 2 days.  Assessment/Plan:  Acute on chronic CKD Stage 4 > 5 HD ongoing per Nephrolgoy - tolerating well thus far  Acute pulmonary edema in setting of volume overload (CKD Stage 4 and CHF) Volume control per HD - patient continues to make slow but consistent improvement  NSTEMI with peak trop 18 LHC with no significant obstructive disease, most likely demand ischemia in setting of acute on chronic anemia w/ small vessel disease - Cardiology has now signed off - continue medical therapy  Acute on chronic grade 2 diastolic CHF TTE 06/08/90 - volume control per dialysis - net negative approximately 5.5 L thus far  Acute on chronic anemia of renal disease no active bleeding - transfused 4U PRBC thus far - hemoglobin holding steady at approximately 9  Hypertension Follow without change in treatment for now given ongoing dialysis  Hyperlipidemia Continue medical therapy   PVD left iliac artery stenting   Non-tophaceous gout Presently quiescent  History of tobacco abuse  Hyperphosphatemia in setting of CKD Stage 4  Type 2 DM A1c 6.1  09/22/15 - CBG quite variable/erratic - follow without change today  Secondary hyperparathyroidism in setting of CKD 4 Continue calcitriol 0.5 mcg daily   Dementia - Delirium with visual hallucinations  Appears to be responding well to scheduled seroquel QHS  Code Status: FULL Family Communication: no family present at time of exam Disposition Plan: SDU   Consultants: Cardiology - [facility]ph[facility]  PCCM  Antibiotics: none  DVT prophylaxis: SCDs  Objective: Blood pressure 144/40, pulse 85, temperature 99.2 F (37.3 C), temperature source Axillary, resp. rate 16, height  (1.676 m), weight 67.3 kg (148 lb 5.9 oz), SpO2 94 %.  Intake/Output Summary (Last 24 hours) at 11/21/15 1449 Last data filed at 11/21/15 1100  Gross per 24 hour  Intake    120 ml  Output   2312 ml  Net  -2192 ml   Exam: General: no evident resp distress - more alert and conversant today Lungs: no wheezing - no focal crackles - improved air movement throughout Cardiovascular: regular rate and rhythm - no murmur gallop rub  Abdomen: nontender, nondistended, soft, bowel sounds positive, no rebound, no ascites, no appreciable mass Extremities: no significant cyanosis, clubbing, or edema bilateral lower extremities persists   Data Reviewed: Basic Metabolic Panel:  Recent Labs Lab 11/16/15 1019 11/16/15 1335  11/18/15 0233 11/19/15 0431 11/19/15 0855 11/20/15 0224 11/21/15 0249  NA 139  --   < > 138 140 139 141 139  K 2.6*  --   < > 4.0 4.8 4.4 4.4 3.9  CL 95*  --   < > 93* 100* 97*  103 99*  CO2 26  --   < > GLUCOSE 163*  --   < > 163* 131* 95 71 115*  BUN 114*  --   < > 137* 78* 83* 54* 78*  CREATININE 3.87*  --   < > 5.11* 4.08* 4.22* 3.98* 5.15*  CALCIUM 8.8*  --   < > 8.4* 7.9* 7.6* 8.1* 8.3*  MG 2.6* 2.6*  --  2.8*  --   --   --   --   PHOS  --   --   < > 10.9* 7.6* 6.9* 5.8* 5.6*  < > = values in this interval not displayed.  CBC:  Recent Labs Lab  11/18/15 0233 11/19/15 0431 11/19/15 0855 11/20/15 0224 11/21/15 0249  WBC 12.5* 15.4* 14.1* 13.1* 12.6*  HGB 6.4* 9.1* 8.8* 9.1* 9.1*  HCT 19.3* 26.9* 25.1* 26.4* 26.7*  MCV 89.4 87.3 86.6 88.9 89.0  PLT 193 203 206 207 222    Liver Function Tests:  Recent Labs Lab 11/16/15 1019 11/17/15 0049 11/18/15 0233 11/19/15 0431 11/19/15 0855 11/20/15 0224 11/21/15 0249  AST 51* 53*  --   --   --   --   --   ALT 19 20  --   --   --   --   --   ALKPHOS 79 77  --   --   --   --   --   BILITOT 1.0 1.4*  --   --   --   --   --   PROT 5.1* 5.0*  --   --   --   --   --   ALBUMIN 2.8* 2.6* 2.5* 2.6* 2.4* 2.3* 2.2*   Coags:  Recent Labs Lab 11/16/15 1019  INR 1.13    Recent Labs Lab 11/16/15 1019  APTT 23*    Cardiac Enzymes:  Recent Labs Lab 11/16/15 1335 11/16/15 2043 11/17/15 0049 11/18/15 0233  TROPONINI 8.35* 9.49* 11.13* 11.08*    CBG:  Recent Labs Lab 11/20/15 0912 11/20/15 1239 11/20/15 1547 11/20/15 2019 11/21/15 1227  GLUCAP 69 168* 87 305* 192*    Recent Results (from the past 240 hour(s))  MRSA PCR Screening     Status: None   Collection Time: 11/16/15  7:45 PM  Result Value Ref Range Status   MRSA by PCR NEGATIVE NEGATIVE Final    Comment:        The GeneXpert MRSA Assay (FDA approved for NASAL specimens only), is one component of a comprehensive MRSA colonization surveillance program. It is not intended to diagnose MRSA infection nor to guide or monitor treatment for MRSA infections.      Studies:   Recent x-ray studies have been reviewed in detail by the Attending Physician  Scheduled Meds:  Scheduled Meds: . allopurinol  300 mg Oral Daily  . antiseptic oral rinse  7 mL Mouth Rinse BID  . aspirin EC  81 mg Oral QPM  . atorvastatin  80 mg Oral q1800  . calcitRIOL  0.5 mcg Oral Daily  . [START ON 11/22/2015] cefUROXime (ZINACEF)  IV  1.5 g Intravenous On Call to OR  . clopidogrel  75 mg Oral Daily  . cyanocobalamin  1,000  mcg Subcutaneous Daily  . darbepoetin (ARANESP) injection - NON-DIALYSIS  150 mcg Subcutaneous Q Wed-1800  . feeding supplement  1 Container Oral TID BM  . insulin aspart  0-15 Units Subcutaneous TID WC  . insulin glargine  10 Units Subcutaneous QHS  . isosorbide mononitrate  60 mg Oral Daily  . lanthanum  1,000 mg Oral TID WC  . metoprolol succinate  25 mg Oral QHS  . multivitamin  1 tablet Oral QHS  . pantoprazole  40 mg Oral Q1200  . QUEtiapine  12.5 mg Oral QHS    Time spent on care of this patient: 35 mins   Donia Yokum T , MD   [facility] Office  [phone] Pager - Text Page per Loretha Stapler as per below:  On-Call/Text Page:      [email or web address]      password [email or web address]  If 7PM-7AM, please contact night-coverage [email or web address] Password [email or web address] 11/21/2015, 2:49 PM   LOS: 5 days

## 2015-11-21 NOTE — Procedures (Signed)
Patient was seen on dialysis and the procedure was supervised. BFR 300 Via RIJ HD cath BP is 145/41.  Patient appears to be tolerating treatment well.

## 2015-11-21 NOTE — Care Management Important Message (Signed)
Important Message  Patient Details  Name: DEMETREUS LOTHAMER MRN: [id number] Date of Birth: 12-May-1930   Medicare Important Message Given:  Yes    Hiram Mciver P Treylin Burtch 11/21/2015, 2:00 PM

## 2015-11-21 NOTE — Progress Notes (Signed)
Upper extremity vein mapping was performed in [facility] office on 10/05/15. Results are in EPIC. Please advise if repeat vein mapping is needed.  Farrel Demark, RDMS, RVT [facility] 11/21/2015

## 2015-11-21 NOTE — Consult Note (Signed)
Hospital Consult    Reason for Consult:  In need of permanent HD access and PC Referring Physician:  Deterding  MRN #:  [id number]  History of Present Illness: This is a 82 y.o. male who was seen at [facility] on 10/05/15 by Dr. Darrick Penna at which time he was scheduled for a right brachiocephalic AVF on 10/12/15.  The duplex ultrasound revealed normal brachial artery anatomic pattern with normal artery diameter of 4 mm at the antecubital area , vein mapping shows cephalic vein 3-4 mm in the upper arm small at the forearm bilaterally also small in the left upper arm basilic vein is 4 mm bilaterally from the upper arm.  The pt decided against the surgery and the surgery was cancelled.    On 11/16/15, the pt presented to the hospital with chest tightness.  He was found to be tachycardic, had diffuse ST segment depresion in the inferior and lateral leads and troponins were elevated.  He was taken to the cardiac catheterization emergently.   He was found to have no significant high grade lesion and his EF ~45%.  70cc of contrast was used.   It was suspected his presentation was related to demand ischemia from severe anemia, metabolic issues and underlying medical comorbidities. Dr. Jacinto Halim discussed the findings with the patient's family, daughter and son, patient has not been taking Procrit shots due to financial issues. He has not had any obvious GI bleed. He was transfused 2 units PRBC's and IVF reduced due to diastolic heart failure in the past.    Echocardiogram 11/16/2015: LV cavity size was normal. Mild concentric hypertrophy. LVEF50% to 55%. Mild diffuse hypokinesis. Grade 2 diastolic dysfunction. Doppler parameters areconsistent with both elevated ventricular end-diastolic fillingpressure and elevated left atrial filling pressure. Mitral valve: Calcified annulus. Atrial septum: No defect or patent foramen ovale was identified. Pulmonary arteries: PA peak pressure: 75 mm Hg (S). Pericardium, extracardiac:  A small pericardial effusion wasidentified. The fluid had no internal echoes.There was no evidence of hemodynamic compromise.  On 11/18/15,  He did have acute hypoxic respiratory failure likely due to pulmonary edema in setting of volume overload following LHC and transfusion with hx of CHF and CKD.  He responded well to BiPap and NTG gtt while in ICU.  He did have a non tunneled right IJ catheter placed by IR on 11/18/15 and has since been dialyzing per renal service.  He is now in the stepdown unit.  [facility] has been consulted for placement of perm cath and permanent HD access.   He does have a hx of diabetes and is on Januvia.  He does have htn and is on a CCB, BB, and hydralazine.  He is on a statin for cholesterol management.    Hx of CEA by Dr. Madilyn Fireman in 2005.  Hx of PTA and stenting of the left distal common iliac artery by Dr. Allyson Sabal 11/01/14.  The pt is on Plavix & Aspirin.   Past Medical History  Diagnosis Date  . Diabetes mellitus   . Hypertension   . Hypercholesteremia   . Gout   . PVD (peripheral vascular disease) (HCC) 7/05    LCE, known 60% RICA 7/12  . CAD (coronary artery disease) 2002    non critical  . Macular degeneration   . Chronic renal disease, stage III   . Claudication (HCC)   . Palpitations     PVCs and PSVT on Holter monitoring  . Bilateral carotid artery disease (HCC)     status post  left carotid endarterectomy performed by Dr. Madilyn Fireman 05/12/04  . Anemia     Past Surgical History  Procedure Laterality Date  . 2d echocardiogram  03/31/2008    EF greater than 55%  . Cardiovascular stress test  06/30/2010    Nonischemic. Low risk  . Cerebral angiogram  04/03/2004    High-grade 80% ostial L ICA stenosis. Medical management.  . Abdominal aortogram  09/01/2007    Widely patent renal arteries. Medical management.  . Peripheral vascular angiogram  05/07/2011    No evidence of intracranial occlusions, stenosis, dissections, or aneurysms seen  . Carotid doppler   06/09/2012    R Vertebral-known occluded vessel, R Bulb/Proximal ICA-moderate to severe amount of plaque w/50-69% diameter reduction, L Subclavian-50-69% diameter reduction, L CEA-demonstrated increased velocities w/o evidence of hemodynamically significant stenosis  . Cardiac catheterization  10/17/2001    No significant CAD, normal LV systolic function.  . Carotid endarterectomy Left July 2005  . Lower extremity angiogram Bilateral 11/01/2014    Procedure: LOWER EXTREMITY ANGIOGRAM;  Surgeon: [name], MD;  Location: [facility] CATH LAB;  Service: Cardiovascular;  Laterality: Bilateral;  . Abdominal angiogram  11/01/2014    Procedure: ABDOMINAL ANGIOGRAM;  Surgeon: [name], MD;  Location: [facility] CATH LAB;  Service: Cardiovascular;;  . Knee surgery    . Hernia repair    . Cardiac catheterization N/A 11/16/2015    Procedure: Left Heart Cath and Coronary Angiography;  Surgeon: Yates Decamp, MD;  Location: [facility] INVASIVE CV LAB;  Service: Cardiovascular;  Laterality: N/A;  . Cardiac catheterization Right 11/16/2015    Procedure: Intravascular Ultrasound/IVUS;  Surgeon: Yates Decamp, MD;  Location: [facility] INVASIVE CV LAB;  Service: Cardiovascular;  Laterality: Right;    Allergies  Allergen Reactions  . Labetalol Swelling    angioedema    Prior to Admission medications   Medication Sig Start Date End Date Taking? Authorizing Provider  allopurinol (ZYLOPRIM) 100 MG tablet TAKE 1 TABLET (100 MG TOTAL) BY MOUTH AT BEDTIME. 01/27/15  Yes Rhonda G Barrett, PA-C  amLODipine (NORVASC) 10 MG tablet Take 1 tablet (10 mg total) by mouth at bedtime. 09/24/15  Yes Catarina Hartshorn, MD  aspirin EC 81 MG tablet Take 81 mg by mouth every evening.   Yes Historical Provider, MD  atorvastatin (LIPITOR) 80 MG tablet Take 1 tablet (80 mg total) by mouth daily at 6 PM. 09/23/15  Yes Catarina Hartshorn, MD  calcitRIOL (ROCALTROL) 0.25 MCG capsule Take 0.25 mcg by mouth every morning.  05/27/13  Yes Historical Provider, MD  clopidogrel (PLAVIX)  75 MG tablet TAKE 1 TABLET BY MOUTH DAILY 11/01/14  Yes [name], MD  ferrous sulfate 325 (65 FE) MG tablet Take 325 mg by mouth 2 (two) times daily with a meal.   Yes Historical Provider, MD  furosemide (LASIX) 80 MG tablet Take 1 tablet (80 mg total) by mouth daily. 09/24/15  Yes Catarina Hartshorn, MD  hydrALAZINE (APRESOLINE) 50 MG tablet Take 1 tablet (50 mg total) by mouth 2 (two) times daily. Patient taking differently: Take 50 mg by mouth 3 (three) times daily.  09/24/15  Yes Catarina Hartshorn, MD  isosorbide mononitrate (IMDUR) 60 MG 24 hr tablet Take 1 tablet (60 mg total) by mouth daily. 09/23/15  Yes Catarina Hartshorn, MD  metoprolol succinate (TOPROL-XL) 50 MG 24 hr tablet Take 1 tablet (50 mg total) by mouth daily. Take with or immediately following a meal. 09/24/15  Yes Catarina Hartshorn, MD  Multiple Vitamins-Minerals (PRESERVISION AREDS PO) Take  2 tablets by mouth daily.   Yes Historical Provider, MD  pantoprazole (PROTONIX) 40 MG tablet Take 1 tablet (40 mg total) by mouth daily. 03/29/15  Yes Belkys A Regalado, MD  polyethylene glycol (MIRALAX / GLYCOLAX) packet Take 17 g by mouth daily as needed for mild constipation. 09/24/15  Yes Catarina Hartshorn, MD  sitaGLIPtin (JANUVIA) 25 MG tablet Take 1 tablet (25 mg total) by mouth daily. 05/03/15  Yes Reather Littler, MD  ACCU-CHEK FASTCLIX LANCETS MISC Use to check blood sugar 2 times per day dx code E11.9 07/20/15   Reather Littler, MD  feeding supplement, RESOURCE BREEZE, (RESOURCE BREEZE) LIQD Take 1 Container by mouth 3 (three) times daily between meals. Patient not taking: Reported on 11/16/2015 03/29/15   Belkys A Regalado, MD  glucose blood (ACCU-CHEK AVIVA PLUS) test strip Use as instructed to check blood sugar 2 times per day dx code E11.9 06/01/15   Reather Littler, MD    Social History   Social History  . Marital Status: Widowed    Spouse Name: N/A  . Number of Children: N/A  . Years of Education: N/A   Occupational History  . Not on file.   Social History Main Topics    . Smoking status: Former Smoker -- 1.00 packs/day for 40 years  . Smokeless tobacco: Never Used     Comment: quit approx. 20 years ago.  . Alcohol Use: No  . Drug Use: Not on file  . Sexual Activity: No   Other Topics Concern  . Not on file   Social History Narrative     Family History  Problem Relation Age of Onset  . Stroke Sister   . Heart disease Brother   . Diabetes Neg Hx     ROS:  Positive    Negative    All sytems reviewed and are negative  Cardiovascular:  chest pain/pressure  hx of left CIA stenting by Dr. Allyson Sabal January 2016  palpitations  SOB lying flat  DOE  pain in legs while walking  pain in legs at rest  pain in legs at night  non-healing ulcers  hx of DVT  swelling in legs  Pulmonary:  productive cough  asthma/wheezing  COPD  home O2  Neurologic:  weakness in  arms  legs  numbness in  arms  legs  hx of CVA  mini stroke difficulty speaking or slurred speech  temporary loss of vision in one eye  dizziness  Hematologic:  hx of cancer  bleeding problems  problems with blood clotting easily  hx pernicious anemia  Endocrine:    diabetes  thyroid disease  GI  vomiting blood  blood in stool  GU:  CKD/renal failure  HD-new-[]  M/W/F or  T/T/S  burning with urination  blood in urine  Psychiatric:  anxiety  depression  Musculoskeletal:  arthritis  joint pain  gout  Integumentary:  rashes  ulcers  Constitutional:  fever  chills   Physical Examination  Filed Vitals:   11/21/15 1100 11/21/15 1232  BP:  144/40  Pulse: 85 85  Temp:  99.2 F (37.3 C)  Resp: 14 16   Body mass index is 23.96 kg/(m^2).  General:  WDWN in NAD Gait: Not observed HENT: WNL, normocephalic Pulmonary: normal non-labored breathing, without Rales, rhonchi,  wheezing Cardiac: irregular, without  Murmurs, rubs or gallops; without left  carotid bruit (unable to listen to right due to line placement) Abdomen:  soft, NT/ND, no masses Skin: without rashes Vascular Exam/Pulses:  Right Left  Radial 2+ (normal) 2+ (normal)  DP 2+ (normal) 2+ (normal)  PT 2+ (normal) 2+ (normal)   Extremities: without ischemic changes, without Gangrene , without cellulitis; without open wounds;  Musculoskeletal: no muscle wasting or atrophy  Neurologic: A&O X 3; Appropriate Affect ; SENSATION: normal; MOTOR FUNCTION:  moving all extremities equally. Speech is fluent/normal   CBC    Component Value Date/Time   WBC 12.6* 11/21/2015 0249   WBC 7.4 09/28/2015   WBC 7.4 09/28/2015   RBC 3.00* 11/21/2015 0249   HGB 9.1* 11/21/2015 0249   HCT 26.7* 11/21/2015 0249   HCT 22.0* 11/16/2015 2043   PLT 222 11/21/2015 0249   MCV 89.0 11/21/2015 0249   MCH 30.3 11/21/2015 0249   MCHC 34.1 11/21/2015 0249   RDW 16.8* 11/21/2015 0249   LYMPHSABS 1.6 09/18/2015 0420   MONOABS 0.8 09/18/2015 0420   EOSABS 0.3 09/18/2015 0420   BASOSABS 0.0 09/18/2015 0420    BMET    Component Value Date/Time   NA 139 11/21/2015 0249   NA 142 09/28/2015   K 3.9 11/21/2015 0249   CL 99* 11/21/2015 0249   CO2 24 11/21/2015 0249   GLUCOSE 115* 11/21/2015 0249   BUN 78* 11/21/2015 0249   BUN 102* 09/28/2015   CREATININE 5.15* 11/21/2015 0249   CREATININE 2.8* 09/28/2015   CALCIUM 8.3* 11/21/2015 0249   GFRNONAA 9* 11/21/2015 0249   GFRAA 11* 11/21/2015 0249    COAGS: Lab Results  Component Value Date   INR 1.13 11/16/2015   INR 1.35 09/11/2015   INR 1.25 09/11/2015     Non-Invasive Vascular Imaging:   duplex ultrasound revealed normal brachial artery anatomic pattern with normal artery diameter of 4 mm at the antecubital area , vein mapping shows cephalic vein 3-4 mm in the upper arm small at the forearm bilaterally also small in the left upper arm basilic vein is 4 mm bilaterally from the upper arm.  Statin:  Yes.   Beta Blocker:  Yes.     Aspirin:  Yes.   ACEI:  No. ARB:  No. Other antiplatelets/anticoagulants:  Yes.   Plavix   ASSESSMENT/PLAN: This is a 82 y.o. male with acute on chronic renal failure in need of permanent HD access and perm cath.   -when pt had been seen in the office last month by Dr. Darrick Penna, a right brachiocephalic AVF was scheduled and pt cancelled surgery. -his CKD has now progressed and he is in need of a tunneled dialysis catheter and permanent access, however, after speaking with the pt, he is not sure he wants to even continue with dialysis and has many questions.  He would like to further discuss with his daughter tonight and Dr. Darrick Penna tomorrow.   -if he decides he does want to proceed, cardiology would also need to clear the pt for surgery given his recent MI. -for now, we will hold off on access.    Doreatha Massed, PA-C [facility] [phone] Agree with above assessment Patient is not ready to commit to hemodialysis at this time If she should decide to proceed-we may consider tunneled hemodialysis catheter at this point and wait a few weeks for the fistula creation since he was admitted with non-STEMI on 11/16/2015. We'll follow along with you

## 2015-11-22 ENCOUNTER — Encounter (HOSPITAL_COMMUNITY)
Admission: EM | Disposition: A | Discharge status: Home / Self Care | Payer: Self-pay | Source: Home / Self Care | Attending: Internal Medicine

## 2015-11-22 DIAGNOSIS — N186 End stage renal disease: Secondary | ICD-10-CM | POA: Diagnosis present

## 2015-11-22 DIAGNOSIS — D631 Anemia in chronic kidney disease: Secondary | ICD-10-CM | POA: Diagnosis present

## 2015-11-22 DIAGNOSIS — R778 Other specified abnormalities of plasma proteins: Secondary | ICD-10-CM | POA: Diagnosis present

## 2015-11-22 DIAGNOSIS — E785 Hyperlipidemia, unspecified: Secondary | ICD-10-CM

## 2015-11-22 DIAGNOSIS — F0280 Dementia in other diseases classified elsewhere without behavioral disturbance: Secondary | ICD-10-CM

## 2015-11-22 DIAGNOSIS — R7989 Other specified abnormal findings of blood chemistry: Secondary | ICD-10-CM

## 2015-11-22 DIAGNOSIS — I129 Hypertensive chronic kidney disease with stage 1 through stage 4 chronic kidney disease, or unspecified chronic kidney disease: Secondary | ICD-10-CM

## 2015-11-22 DIAGNOSIS — E1121 Type 2 diabetes mellitus with diabetic nephropathy: Secondary | ICD-10-CM | POA: Diagnosis present

## 2015-11-22 DIAGNOSIS — N189 Chronic kidney disease, unspecified: Secondary | ICD-10-CM

## 2015-11-22 DIAGNOSIS — G3183 Dementia with Lewy bodies: Secondary | ICD-10-CM | POA: Diagnosis present

## 2015-11-22 DIAGNOSIS — M10321 Gout due to renal impairment, right elbow: Secondary | ICD-10-CM

## 2015-11-22 DIAGNOSIS — I10 Essential (primary) hypertension: Secondary | ICD-10-CM | POA: Diagnosis present

## 2015-11-22 DIAGNOSIS — M10329 Gout due to renal impairment, unspecified elbow: Secondary | ICD-10-CM | POA: Diagnosis present

## 2015-11-22 LAB — RENAL FUNCTION PANEL
ANION GAP: 13 (ref 5–15)
Albumin: 2.2 g/dL — ABNORMAL LOW (ref 3.5–5.0)
BUN: 56 mg/dL — ABNORMAL HIGH (ref 6–20)
CHLORIDE: 102 mmol/L (ref 101–111)
CO2: 24 mmol/L (ref 22–32)
CREATININE: 4.53 mg/dL — AB (ref 0.61–1.24)
Calcium: 8.4 mg/dL — ABNORMAL LOW (ref 8.9–10.3)
GFR calc non Af Amer: 11 mL/min — ABNORMAL LOW (ref >60)
GFR, EST AFRICAN AMERICAN: 13 mL/min — AB (ref >60)
Glucose, Bld: 104 mg/dL — ABNORMAL HIGH (ref 65–99)
POTASSIUM: 3.7 mmol/L (ref 3.5–5.1)
Phosphorus: 4.3 mg/dL (ref 2.5–4.6)
Sodium: 139 mmol/L (ref 135–145)

## 2015-11-22 LAB — GLUCOSE, CAPILLARY
GLUCOSE-CAPILLARY: 224 mg/dL — AB (ref 65–99)
GLUCOSE-CAPILLARY: 220 mg/dL — AB (ref 65–99)
GLUCOSE-CAPILLARY: 144 mg/dL — AB (ref 65–99)
GLUCOSE-CAPILLARY: 165 mg/dL — AB (ref 65–99)
Glucose-Capillary: 159 mg/dL — ABNORMAL HIGH (ref 65–99)

## 2015-11-22 SURGERY — INSERTION OF DIALYSIS CATHETER
Anesthesia: Monitor Anesthesia Care | Site: Neck | Laterality: Right

## 2015-11-22 MED ORDER — AMLODIPINE BESYLATE 5 MG PO TABS
5.0000 mg | ORAL_TABLET | Freq: Every day | ORAL | Status: DC
  Administered 2015-11-22 – 2015-11-26 (×4): 5 mg via ORAL
  Filled 2015-11-22 (×5): qty 1

## 2015-11-22 MED ORDER — INSULIN GLARGINE 100 UNIT/ML ~~LOC~~ SOLN
13.0000 [IU] | Freq: Every day | SUBCUTANEOUS | Status: DC
  Administered 2015-11-22 – 2015-11-25 (×4): 13 [IU] via SUBCUTANEOUS
  Filled 2015-11-22 (×6): qty 0.13

## 2015-11-22 NOTE — Progress Notes (Signed)
Pt was c/o abdominal pain/ pressure he requested stool softener, miralax given and able to have large hard stool and he had no c/o abdominal pain after the bowel movement,the patient still for bladder scan RN was informed.

## 2015-11-22 NOTE — Progress Notes (Addendum)
RN spoke with pt and family due to pt deciding he does not want to have AV fistula placed this morning. He stated that he does not wish to continue with dialysis and he said "I have a lot to look forward to, like seeing my wife".  Pt understands that without dialysis he cannot live. Nephrology night MD was notified and RN placed a note on the consent form.

## 2015-11-22 NOTE — Progress Notes (Signed)
Dr. Hadley Pen recommendations noted that pt does not wish to proceed with HD or permanent access.  Please re-consult if pt changes his mind and wishes to proceed.  Doreatha Massed 11/22/2015 1:29 PM

## 2015-11-22 NOTE — Progress Notes (Signed)
Patient ID: [name], male   DOB: Feb 09, 1930, 82 y.o.   MRN: [id number] S:Mr. Bartelt changed his mind about dialysis and does not want to pursue AVF/AVG placement.  He was miserable on HD last night and is much more awake and alert today O:BP 139/53 mmHg  Pulse 82  Temp(Src) 98.2 F (36.8 C) (Oral)  Resp 15  Ht  (1.676 m)  Wt 67.3 kg (148 lb 5.9 oz)  BMI 23.96 kg/m2  SpO2 97%  Intake/Output Summary (Last 24 hours) at 11/22/15 1009 Last data filed at 11/21/15 1700  Gross per 24 hour  Intake    360 ml  Output      0 ml  Net    360 ml   Intake/Output: I/O last 3 completed shifts: In: 360 [P.O.:360] Out: 2112 [Urine:100; Other:2012]  Intake/Output this shift:    Weight change:  WUJ:WJXBJ elderly WM in NAd CVS:no rub Resp:cta YNW:GNFAOZ, some suprapubic distension Ext:no edema   Recent Labs Lab 11/16/15 1019  11/17/15 0049 11/18/15 0233 11/19/15 0431 11/19/15 0855 11/20/15 0224 11/21/15 0249 11/22/15 0408  NA 139  < > 139 138 140 139 141 139 139  K 2.6*  < > 4.2 4.0 4.8 4.4 4.4 3.9 3.7  CL 95*  < > 94* 93* 100* 97* 103 99* 102  CO2 26  < > GLUCOSE 163*  < > 181* 163* 131* 95 71 115* 104*  BUN 114*  < > 122* 137* 78* 83* 54* 78* 56*  CREATININE 3.87*  < > 4.07* 5.11* 4.08* 4.22* 3.98* 5.15* 4.53*  ALBUMIN 2.8*  --  2.6* 2.5* 2.6* 2.4* 2.3* 2.2* 2.2*  CALCIUM 8.8*  < > 8.7* 8.4* 7.9* 7.6* 8.1* 8.3* 8.4*  PHOS  --   --  11.0* 10.9* 7.6* 6.9* 5.8* 5.6* 4.3  AST 51*  --  53*  --   --   --   --   --   --   ALT 19  --  20  --   --   --   --   --   --   < > = values in this interval not displayed. Liver Function Tests:  Recent Labs Lab 11/16/15 1019 11/17/15 0049  11/20/15 0224 11/21/15 0249 11/22/15 0408  AST 51* 53*  --   --   --   --   ALT 19 20  --   --   --   --   ALKPHOS 79 77  --   --   --   --   BILITOT 1.0 1.4*  --   --   --   --   PROT 5.1* 5.0*  --   --   --   --   ALBUMIN 2.8* 2.6*  < > 2.3* 2.2* 2.2*  < > = values in  this interval not displayed. No results for input(s): LIPASE, AMYLASE in the last 168 hours.  Recent Labs Lab 11/21/15 0249  AMMONIA 27   CBC:  Recent Labs Lab 11/18/15 0233 11/19/15 0431 11/19/15 0855 11/20/15 0224 11/21/15 0249  WBC 12.5* 15.4* 14.1* 13.1* 12.6*  HGB 6.4* 9.1* 8.8* 9.1* 9.1*  HCT 19.3* 26.9* 25.1* 26.4* 26.7*  MCV 89.4 87.3 86.6 88.9 89.0  PLT 193 203 206 207 222   Cardiac Enzymes:  Recent Labs Lab 11/16/15 1335 11/16/15 2043 11/17/15 0049 11/18/15 0233  TROPONINI 8.35* 9.49* 11.13* 11.08*   CBG:  Recent Labs Lab  11/20/15 2019 11/21/15 1227 11/21/15 1624 11/21/15 2118 11/22/15 0834  GLUCAP 305* 192* 202* 117* 159*    Iron Studies: No results for input(s): IRON, TIBC, TRANSFERRIN, FERRITIN in the last 72 hours. Studies/Results: No results found. Marland Kitchen allopurinol  300 mg Oral Daily  . antiseptic oral rinse  7 mL Mouth Rinse BID  . aspirin EC  81 mg Oral QPM  . atorvastatin  80 mg Oral q1800  . calcitRIOL  0.5 mcg Oral Daily  . cefUROXime (ZINACEF)  IV  1.5 g Intravenous To SS-Surg  . Chlorhexidine Gluconate Cloth  6 each Topical Once  . clopidogrel  75 mg Oral Daily  . cyanocobalamin  1,000 mcg Subcutaneous Daily  . darbepoetin (ARANESP) injection - NON-DIALYSIS  150 mcg Subcutaneous Q Wed-1800  . feeding supplement  1 Container Oral TID BM  . insulin aspart  0-15 Units Subcutaneous TID WC  . insulin glargine  10 Units Subcutaneous QHS  . isosorbide mononitrate  60 mg Oral Daily  . lanthanum  1,000 mg Oral TID WC  . metoprolol succinate  25 mg Oral QHS  . multivitamin  1 tablet Oral QHS  . pantoprazole  40 mg Oral Q1200  . QUEtiapine  12.5 mg Oral QHS    BMET    Component Value Date/Time   NA 139 11/22/2015 0408   NA 142 09/28/2015   K 3.7 11/22/2015 0408   CL 102 11/22/2015 0408   CO2 24 11/22/2015 0408   GLUCOSE 104* 11/22/2015 0408   BUN 56* 11/22/2015 0408   BUN 102* 09/28/2015   CREATININE 4.53* 11/22/2015 0408    CREATININE 2.8* 09/28/2015   CALCIUM 8.4* 11/22/2015 0408   GFRNONAA 11* 11/22/2015 0408   GFRAA 13* 11/22/2015 0408   CBC    Component Value Date/Time   WBC 12.6* 11/21/2015 0249   WBC 7.4 09/28/2015   WBC 7.4 09/28/2015   RBC 3.00* 11/21/2015 0249   HGB 9.1* 11/21/2015 0249   HCT 26.7* 11/21/2015 0249   HCT 22.0* 11/16/2015 2043   PLT 222 11/21/2015 0249   MCV 89.0 11/21/2015 0249   MCH 30.3 11/21/2015 0249   MCHC 34.1 11/21/2015 0249   RDW 16.8* 11/21/2015 0249   LYMPHSABS 1.6 09/18/2015 0420   MONOABS 0.8 09/18/2015 0420   EOSABS 0.3 09/18/2015 0420   BASOSABS 0.0 09/18/2015 0420     Assessment/Plan:  1. AKI/CKD stage 5- in setting of IV contrast and ABLA.  Is much more awake and alert today and is quite clear that he does not want to continue with hemodialysis as he does not think he could tolerate it 3 days a week.  He understands the implications and discussed this with his family.  Would ask palliative care to come back and speak with him regarding moving forward with hospice if his Scr/renal function continues to worsen.  He has had a similar presentation in the past and was able to recover enough kidney function that he did not require HD so it is possible.  Would check bladder scan to r/o BOO and follow labs and UOP for now.  Agree with transitioning to palliative care 2. Anemia- improving with esa 3. CAD- stable 4. Protein malnutrition 5. CHF acute on chronic- resolved 6. AMS- resolved 7. Dispo- palliative care   Julien Nordmann Lorena Benham

## 2015-11-22 NOTE — Progress Notes (Signed)
[place] TEAM 1 - Stepdown/ICU TEAM Progress Note  [name] WJX:[id number] DOB: 1930/10/29 DOA: 11/16/2015 PCP: Lupita Raider, MD  Admit HPI / Brief Narrative: 81 yo WM PMHx DM Type 2, HTN, HLD, gout, PVD, CAD native artery, Bilateral Carotid Artery Dz, Palpitations, CKD stage III,  who presented on 1/18 for chest pain found to have NSTEMI s/p LHC with onset of acute pulmonary edema after blood transfusion.  HPI/Subjective: 1/24  A/O 4, negative CP, negative SOB, negative N/V. States is hungry. States after talking with Dr. Terrial Rhodes Nephrology he has decided that he should go along with placement of the AV fistula.   Assessment/Plan:  Acute on chronic CKD Stage 4 > 5 -HD ongoing per Nephrolgoy - tolerating well thus far -1/24 Patient has agreed to placement of AV fistula. Reconsulted Vascular Surgery will see patient in A.m.  Acute pulmonary edema in setting of volume overload (CKD Stage 4 and CHF) -Volume control per HD - patient continues to make slow but consistent improvement -Contacted Dr. Terrial Rhodes Nephrology and informed him that patient had changes mind concerning AV fistula placement  NSTEMI with peak trop 18 -LHC with no significant obstructive disease, most likely demand ischemia in setting of acute on chronic anemia w/ small vessel disease  - Cardiology has now signed off - continue medical therapy  Acute on chronic grade 2 diastolic CHF -TTE 11/16/15 - volume control per dialysis  -Strict in and out since admission -4.9 L -Daily weight   Acute on chronic anemia of renal disease -no active bleeding  - transfused 4U PRBC thus far - hemoglobin holding steady at approximately 9  Hypertension -Amlodipine 5 mg daily  Hyperlipidemia -Continue Lipitor 80 mg daily  PVD -left iliac artery stenting   Non-tophaceous gout -Presently quiescent  History of tobacco abuse  Hyperphosphatemia in setting of CKD Stage 4  DM Type 2  -09/22/15  hemoglobin A1c = 6.1  - Increase Lantus 13 units daily  -Continue moderate SSI   Secondary hyperparathyroidism in setting of CKD 4 Continue calcitriol 0.5 mcg daily   Dementia - Delirium with visual hallucinations  -Continue Seroquel 12.5 mg QHS    Code Status: FULL Family Communication: no family present at time of exam Disposition Plan: SNF?    Consultants: Dr.Joseph Coladonato nephrology PASamantha J Rhyne vascular surgery   Procedure/Significant Events:    Culture NA  Antibiotics: NA  DVT prophylaxis: SCD  Devices NA   LINES / TUBES:  NA    Continuous Infusions: . sodium chloride      Objective: VITAL SIGNS: Temp: 97.5 F (36.4 C) (01/24 1549) Temp Source: Oral (01/24 1549) BP: 194/34 mmHg (01/24 1549) Pulse Rate: 66 (01/24 1900) SPO2; FIO2:   Intake/Output Summary (Last 24 hours) at 11/22/15 2046 Last data filed at 11/22/15 1200  Gross per 24 hour  Intake    240 ml  Output      0 ml  Net    240 ml     Exam: General: A/O 4, NAD, No acute respiratory distress Eyes: Negative headache, negative scleral hemorrhage ENT: Negative Runny nose, negative gingival bleeding, Neck:  Negative scars, masses, torticollis, lymphadenopathy, JVD Lungs: Clear to auscultation bilaterally without wheezes or crackles Cardiovascular: Regular rate and rhythm without murmur gallop or rub normal S1 and S2 Abdomen:negative abdominal pain, nondistended, positive soft, bowel sounds, no rebound, no ascites, no appreciable mass Extremities: No significant cyanosis, clubbing, or edema bilateral lower extremities Psychiatric:  Negative depression, negative anxiety, negative fatigue, negative  mania  Neurologic:  Cranial nerves II through XII intact, tongue/uvula midline, all extremities muscle strength 5/5, sensation intact throughout, negative dysarthria, negative expressive aphasia, negative receptive aphasia.   Data Reviewed: Basic Metabolic Panel:  Recent  Labs Lab 11/16/15 1019 11/16/15 1335  11/18/15 0233 11/19/15 0431 11/19/15 0855 11/20/15 0224 11/21/15 0249 11/22/15 0408  NA 139  --   < > 138 140 139 141 139 139  K 2.6*  --   < > 4.0 4.8 4.4 4.4 3.9 3.7  CL 95*  --   < > 93* 100* 97* 103 99* 102  CO2 26  --   < > GLUCOSE 163*  --   < > 163* 131* 95 71 115* 104*  BUN 114*  --   < > 137* 78* 83* 54* 78* 56*  CREATININE 3.87*  --   < > 5.11* 4.08* 4.22* 3.98* 5.15* 4.53*  CALCIUM 8.8*  --   < > 8.4* 7.9* 7.6* 8.1* 8.3* 8.4*  MG 2.6* 2.6*  --  2.8*  --   --   --   --   --   PHOS  --   --   < > 10.9* 7.6* 6.9* 5.8* 5.6* 4.3  < > = values in this interval not displayed. Liver Function Tests:  Recent Labs Lab 11/16/15 1019 11/17/15 0049  11/19/15 0431 11/19/15 0855 11/20/15 0224 11/21/15 0249 11/22/15 0408  AST 51* 53*  --   --   --   --   --   --   ALT 19 20  --   --   --   --   --   --   ALKPHOS 79 77  --   --   --   --   --   --   BILITOT 1.0 1.4*  --   --   --   --   --   --   PROT 5.1* 5.0*  --   --   --   --   --   --   ALBUMIN 2.8* 2.6*  < > 2.6* 2.4* 2.3* 2.2* 2.2*  < > = values in this interval not displayed. No results for input(s): LIPASE, AMYLASE in the last 168 hours.  Recent Labs Lab 11/21/15 0249  AMMONIA 27   CBC:  Recent Labs Lab 11/18/15 0233 11/19/15 0431 11/19/15 0855 11/20/15 0224 11/21/15 0249  WBC 12.5* 15.4* 14.1* 13.1* 12.6*  HGB 6.4* 9.1* 8.8* 9.1* 9.1*  HCT 19.3* 26.9* 25.1* 26.4* 26.7*  MCV 89.4 87.3 86.6 88.9 89.0  PLT 193 203 206 207 222   Cardiac Enzymes:  Recent Labs Lab 11/16/15 1335 11/16/15 2043 11/17/15 0049 11/18/15 0233  TROPONINI 8.35* 9.49* 11.13* 11.08*   BNP (last 3 results)  Recent Labs  09/11/15 0955 11/16/15 1015  BNP 2200.4* 2120.5*    ProBNP (last 3 results) No results for input(s): PROBNP in the last 8760 hours.  CBG:  Recent Labs Lab 11/21/15 2118 11/22/15 0834 11/22/15 1208 11/22/15 1306 11/22/15 1601  GLUCAP  117* 159* 224* 220* 144*    Recent Results (from the past 240 hour(s))  MRSA PCR Screening     Status: None   Collection Time: 11/16/15  7:45 PM  Result Value Ref Range Status   MRSA by PCR NEGATIVE NEGATIVE Final    Comment:        The GeneXpert MRSA Assay (FDA approved for NASAL specimens only), is one component  of a comprehensive MRSA colonization surveillance program. It is not intended to diagnose MRSA infection nor to guide or monitor treatment for MRSA infections.      Studies:  Recent x-ray studies have been reviewed in detail by the Attending Physician  Scheduled Meds:  Scheduled Meds: . allopurinol  300 mg Oral Daily  . amLODipine  5 mg Oral Daily  . antiseptic oral rinse  7 mL Mouth Rinse BID  . aspirin EC  81 mg Oral QPM  . atorvastatin  80 mg Oral q1800  . calcitRIOL  0.5 mcg Oral Daily  . cefUROXime (ZINACEF)  IV  1.5 g Intravenous To SS-Surg  . Chlorhexidine Gluconate Cloth  6 each Topical Once  . clopidogrel  75 mg Oral Daily  . cyanocobalamin  1,000 mcg Subcutaneous Daily  . darbepoetin (ARANESP) injection - NON-DIALYSIS  150 mcg Subcutaneous Q Wed-1800  . feeding supplement  1 Container Oral TID BM  . insulin aspart  0-15 Units Subcutaneous TID WC  . insulin glargine  13 Units Subcutaneous QHS  . isosorbide mononitrate  60 mg Oral Daily  . lanthanum  1,000 mg Oral TID WC  . metoprolol succinate  25 mg Oral QHS  . multivitamin  1 tablet Oral QHS  . pantoprazole  40 mg Oral Q1200  . QUEtiapine  12.5 mg Oral QHS    Time spent on care of this patient: 40 mins   WOODS, Roselind Messier , MD  [facility] Office  [phone] Pager [phone]  On-Call/Text Page:      [email or web address]      password [email or web address]  If 7PM-7AM, please contact night-coverage [email or web address] Password [facility] 11/22/2015, 8:46 PM   LOS: 6 days   Care during the described time interval was provided by me .  I have reviewed this patient's available data, including medical history,  events of note, physical examination, and all test results as part of my evaluation. I have personally reviewed and interpreted all radiology studies.   Carolyne Littles, MD [phone] Pager

## 2015-11-23 ENCOUNTER — Encounter (HOSPITAL_COMMUNITY)
Admission: EM | Disposition: A | Discharge status: Home / Self Care | Payer: Self-pay | Source: Home / Self Care | Attending: Internal Medicine

## 2015-11-23 ENCOUNTER — Inpatient Hospital Stay (HOSPITAL_COMMUNITY): Payer: Medicare Other | Admitting: Certified Registered Nurse Anesthetist

## 2015-11-23 ENCOUNTER — Inpatient Hospital Stay (HOSPITAL_COMMUNITY): Payer: Medicare Other

## 2015-11-23 ENCOUNTER — Encounter (HOSPITAL_COMMUNITY): Payer: Self-pay | Admitting: Certified Registered Nurse Anesthetist

## 2015-11-23 DIAGNOSIS — R7989 Other specified abnormal findings of blood chemistry: Secondary | ICD-10-CM

## 2015-11-23 DIAGNOSIS — N185 Chronic kidney disease, stage 5: Secondary | ICD-10-CM

## 2015-11-23 DIAGNOSIS — Z794 Long term (current) use of insulin: Secondary | ICD-10-CM

## 2015-11-23 DIAGNOSIS — E1121 Type 2 diabetes mellitus with diabetic nephropathy: Secondary | ICD-10-CM

## 2015-11-23 HISTORY — PX: INSERTION OF DIALYSIS CATHETER: SHX1324

## 2015-11-23 LAB — CBC
HCT: 28.9 % — ABNORMAL LOW (ref 39.0–52.0)
HEMOGLOBIN: 9.6 g/dL — AB (ref 13.0–17.0)
MCH: 29.3 pg (ref 26.0–34.0)
MCHC: 33.2 g/dL (ref 30.0–36.0)
MCV: 88.1 fL (ref 78.0–100.0)
PLATELETS: 197 10*3/uL (ref 150–400)
RBC: 3.28 MIL/uL — ABNORMAL LOW (ref 4.22–5.81)
RDW: 16.1 % — AB (ref 11.5–15.5)
WBC: 13 10*3/uL — ABNORMAL HIGH (ref 4.0–10.5)

## 2015-11-23 LAB — GLUCOSE, CAPILLARY
GLUCOSE-CAPILLARY: 98 mg/dL (ref 65–99)
GLUCOSE-CAPILLARY: 79 mg/dL (ref 65–99)
GLUCOSE-CAPILLARY: 108 mg/dL — AB (ref 65–99)
Glucose-Capillary: 143 mg/dL — ABNORMAL HIGH (ref 65–99)
Glucose-Capillary: 69 mg/dL (ref 65–99)
Glucose-Capillary: 161 mg/dL — ABNORMAL HIGH (ref 65–99)

## 2015-11-23 LAB — RENAL FUNCTION PANEL
ALBUMIN: 2.1 g/dL — AB (ref 3.5–5.0)
Anion gap: 12 (ref 5–15)
BUN: 69 mg/dL — AB (ref 6–20)
CALCIUM: 8.4 mg/dL — AB (ref 8.9–10.3)
CO2: 25 mmol/L (ref 22–32)
Chloride: 101 mmol/L (ref 101–111)
Creatinine, Ser: 4.99 mg/dL — ABNORMAL HIGH (ref 0.61–1.24)
GFR calc Af Amer: 11 mL/min — ABNORMAL LOW (ref >60)
GFR calc non Af Amer: 10 mL/min — ABNORMAL LOW (ref >60)
GLUCOSE: 91 mg/dL (ref 65–99)
PHOSPHORUS: 5.3 mg/dL — AB (ref 2.5–4.6)
Potassium: 3.6 mmol/L (ref 3.5–5.1)
SODIUM: 138 mmol/L (ref 135–145)

## 2015-11-23 SURGERY — INSERTION OF DIALYSIS CATHETER
Anesthesia: Monitor Anesthesia Care | Laterality: Right

## 2015-11-23 MED ORDER — PROMETHAZINE HCL 25 MG/ML IJ SOLN: 6.2500 mg | INTRAMUSCULAR | Status: DC | PRN

## 2015-11-23 MED ORDER — DEXTROSE 50 % IV SOLN
INTRAVENOUS | Status: AC
  Filled 2015-11-23: qty 50

## 2015-11-23 MED ORDER — FENTANYL CITRATE (PF) 100 MCG/2ML IJ SOLN: 25.0000 ug | INTRAMUSCULAR | Status: DC | PRN

## 2015-11-23 MED ORDER — LIDOCAINE HCL (PF) 1 % IJ SOLN
INTRAMUSCULAR | Status: AC
  Filled 2015-11-23: qty 30

## 2015-11-23 MED ORDER — PROPOFOL 500 MG/50ML IV EMUL
INTRAVENOUS | Status: DC | PRN
  Administered 2015-11-23: 40 ug/kg/min via INTRAVENOUS

## 2015-11-23 MED ORDER — HEPARIN SODIUM (PORCINE) 5000 UNIT/ML IJ SOLN
INTRAMUSCULAR | Status: DC | PRN
  Administered 2015-11-23: 15:00:00

## 2015-11-23 MED ORDER — PROPOFOL 10 MG/ML IV BOLUS
INTRAVENOUS | Status: DC | PRN
  Administered 2015-11-23: 20 mg via INTRAVENOUS

## 2015-11-23 MED ORDER — FENTANYL CITRATE (PF) 250 MCG/5ML IJ SOLN
INTRAMUSCULAR | Status: AC
  Filled 2015-11-23: qty 5

## 2015-11-23 MED ORDER — LIDOCAINE HCL (PF) 1 % IJ SOLN
INTRAMUSCULAR | Status: DC | PRN
  Administered 2015-11-23: 30 mL

## 2015-11-23 MED ORDER — DEXTROSE 5 % IV SOLN
INTRAVENOUS | Status: AC
  Filled 2015-11-23: qty 1.5

## 2015-11-23 MED ORDER — HEPARIN SODIUM (PORCINE) 1000 UNIT/ML IJ SOLN
INTRAMUSCULAR | Status: DC | PRN
  Administered 2015-11-23: 1000 [IU] via INTRAVENOUS

## 2015-11-23 MED ORDER — 0.9 % SODIUM CHLORIDE (POUR BTL) OPTIME
TOPICAL | Status: DC | PRN
  Administered 2015-11-23: 1000 mL

## 2015-11-23 MED ORDER — OXYCODONE-ACETAMINOPHEN 5-325 MG PO TABS: 1.0000 | ORAL_TABLET | ORAL | Status: DC | PRN

## 2015-11-23 MED ORDER — ONDANSETRON HCL 4 MG/2ML IJ SOLN
INTRAMUSCULAR | Status: AC
  Filled 2015-11-23: qty 2

## 2015-11-23 MED ORDER — SODIUM CHLORIDE 0.9 % IV SOLN
INTRAVENOUS | Status: DC | PRN
Start: 2015-11-23 — End: 2015-11-23
  Administered 2015-11-23: 13:00:00 via INTRAVENOUS

## 2015-11-23 MED ORDER — LIDOCAINE HCL (CARDIAC) 20 MG/ML IV SOLN
INTRAVENOUS | Status: AC
  Filled 2015-11-23: qty 5

## 2015-11-23 MED ORDER — FENTANYL CITRATE (PF) 100 MCG/2ML IJ SOLN
INTRAMUSCULAR | Status: DC | PRN
  Administered 2015-11-23: 25 ug via INTRAVENOUS

## 2015-11-23 MED ORDER — HEPARIN SODIUM (PORCINE) 1000 UNIT/ML IJ SOLN
INTRAMUSCULAR | Status: AC
  Filled 2015-11-23: qty 1

## 2015-11-23 MED ORDER — PROPOFOL 10 MG/ML IV BOLUS
INTRAVENOUS | Status: AC
  Filled 2015-11-23: qty 20

## 2015-11-23 MED ORDER — SODIUM CHLORIDE 0.9 % IV SOLN
INTRAVENOUS | Status: DC
  Administered 2015-11-23: 14:00:00 via INTRAVENOUS

## 2015-11-23 SURGICAL SUPPLY — 35 items
BAG DECANTER FOR FLEXI CONT (MISCELLANEOUS) ×2 IMPLANT
BIOPATCH RED 1 DISK 7.0 (GAUZE/BANDAGES/DRESSINGS) ×2 IMPLANT
CATH PALINDROME RT-P 15FX19CM (CATHETERS) ×2 IMPLANT
CHLORAPREP W/TINT 26ML (MISCELLANEOUS) ×2 IMPLANT
COVER PROBE W GEL 5X96 (DRAPES) ×2 IMPLANT
DRAPE C-ARM 42X72 X-RAY (DRAPES) ×2 IMPLANT
DRAPE CHEST BREAST 15X10 FENES (DRAPES) ×2 IMPLANT
DRAPE PROXIMA HALF (DRAPES) ×2 IMPLANT
GAUZE SPONGE 2X2 8PLY STRL LF (GAUZE/BANDAGES/DRESSINGS) ×1 IMPLANT
GAUZE SPONGE 4X4 16PLY XRAY LF (GAUZE/BANDAGES/DRESSINGS) ×2 IMPLANT
GLOVE BIO SURGEON STRL SZ7.5 (GLOVE) ×2 IMPLANT
GLOVE BIOGEL PI IND STRL 6.5 (GLOVE) ×1 IMPLANT
GLOVE BIOGEL PI IND STRL 7.0 (GLOVE) ×1 IMPLANT
GLOVE BIOGEL PI INDICATOR 6.5 (GLOVE) ×1
GLOVE BIOGEL PI INDICATOR 7.0 (GLOVE) ×1
GLOVE ECLIPSE 6.5 STRL STRAW (GLOVE) ×2 IMPLANT
GOWN STRL REUS W/ TWL LRG LVL3 (GOWN DISPOSABLE) ×2 IMPLANT
GOWN STRL REUS W/TWL LRG LVL3 (GOWN DISPOSABLE) ×2
KIT BASIN OR (CUSTOM PROCEDURE TRAY) ×2 IMPLANT
KIT ROOM TURNOVER OR (KITS) ×2 IMPLANT
LIQUID BAND (GAUZE/BANDAGES/DRESSINGS) ×2 IMPLANT
NEEDLE 18GX1X1/2 (RX/OR ONLY) (NEEDLE) ×2 IMPLANT
NEEDLE HYPO 25GX1X1/2 BEV (NEEDLE) ×2 IMPLANT
NS IRRIG 1000ML POUR BTL (IV SOLUTION) ×2 IMPLANT
PACK SURGICAL SETUP 50X90 (CUSTOM PROCEDURE TRAY) ×2 IMPLANT
PAD ARMBOARD 7.5X6 YLW CONV (MISCELLANEOUS) ×4 IMPLANT
SPONGE GAUZE 2X2 STER 10/PKG (GAUZE/BANDAGES/DRESSINGS) ×1
SUT ETHILON 3 0 PS 1 (SUTURE) ×2 IMPLANT
SUT VICRYL 4-0 PS2 18IN ABS (SUTURE) ×2 IMPLANT
SYR 20CC LL (SYRINGE) ×4 IMPLANT
SYR 5ML LL (SYRINGE) ×2 IMPLANT
SYR CONTROL 10ML LL (SYRINGE) ×2 IMPLANT
SYRINGE 10CC LL (SYRINGE) ×2 IMPLANT
TAPE CLOTH SURG 4X10 WHT LF (GAUZE/BANDAGES/DRESSINGS) ×2 IMPLANT
WATER STERILE IRR 1000ML POUR (IV SOLUTION) ×2 IMPLANT

## 2015-11-23 NOTE — Anesthesia Preprocedure Evaluation (Addendum)
Anesthesia Evaluation  Patient identified by MRN, date of birth, ID band Patient awake    Reviewed: Allergy & Precautions, NPO status , Patient's Chart, lab work & pertinent test results  Airway Mallampati: II  TM Distance: >3 FB Neck ROM: Full    Dental no notable dental hx.    Pulmonary neg pulmonary ROS, former smoker,    Pulmonary exam normal breath sounds clear to auscultation       Cardiovascular hypertension, + CAD and + Peripheral Vascular Disease  Normal cardiovascular exam Rhythm:Regular Rate:Normal     Neuro/Psych negative neurological ROS  negative psych ROS   GI/Hepatic negative GI ROS, Neg liver ROS,   Endo/Other  diabetes  Renal/GU ESRFRenal disease  negative genitourinary   Musculoskeletal negative musculoskeletal ROS (+)   Abdominal   Peds negative pediatric ROS (+)  Hematology  (+) anemia ,   Anesthesia Other Findings   Reproductive/Obstetrics negative OB ROS                            Anesthesia Physical Anesthesia Plan  ASA: III  Anesthesia Plan: MAC   Post-op Pain Management:    Induction: Intravenous  Airway Management Planned: Simple Face Mask  Additional Equipment:   Intra-op Plan:   Post-operative Plan:   Informed Consent: I have reviewed the patients History and Physical, chart, labs and discussed the procedure including the risks, benefits and alternatives for the proposed anesthesia with the patient or authorized representative who has indicated his/her understanding and acceptance.   Dental advisory given  Plan Discussed with: CRNA and Surgeon  Anesthesia Plan Comments:         Anesthesia Quick Evaluation

## 2015-11-23 NOTE — Progress Notes (Signed)
[place] TEAM 1 - Stepdown/ICU TEAM PROGRESS NOTE  [name] ZOX:[id number] DOB: 27-Feb-1930 DOA: 11/16/2015 PCP: Lupita Raider, MD  Admit HPI / Brief Narrative: 82 yo M who presented on 1/18 for chest pain found to have NSTEMI s/p LHC with onset of acute pulmonary edema after blood transfusion.  Significant Events: 1/18: Admitted with CP found to have NSTEMI with LHC with no significant blockage.  1/18 Hg 6.2 requiring blood transfusion shortly after which developed respiratory failure  1/18 CXR with pulmonary edema started on IV lasix  1/19 CXR with no change. Minimal diuresis.  1/25 19 cm Diatek catheter right internal jugular vein  HPI/Subjective: The patient looks great today.  He is much more alert, and jovial.  He denies shortness of breath chest pain nausea vomiting or abdominal pain.  Assessment/Plan:  Acute on chronic CKD Stage 4 > 5 HD per Nephrolgoy   Acute pulmonary edema in setting of volume overload (CKD Stage 4 and CHF) Volume control per HD - volume status much better today   NSTEMI with peak trop 18 LHC with no significant obstructive disease, most likely demand ischemia in setting of acute on chronic anemia w/ small vessel disease - Cardiology has signed off - continue medical therapy  Acute on chronic grade 2 diastolic CHF TTE 06/08/90 - volume control per dialysis   Acute on chronic anemia of renal disease no active bleeding - transfused 4U PRBC thus far - hemoglobin holding steady at approximately 9  Hypertension HTN persists - will titrate med tx and follow   Hyperlipidemia Continue medical therapy   PVD left iliac artery stenting   Non-tophaceous gout Presently quiescent  History of tobacco abuse  Hyperphosphatemia in setting of CKD Stage 4  Type 2 DM A1c 6.1 09/22/15 - CBG well controlled presently   Secondary hyperparathyroidism in setting of CKD 4 Continue calcitriol 0.5 mcg daily   Dementia - Delirium with visual  hallucinations  Appears to be responding well to scheduled seroquel QHS - mental status at the best I have seen it today   Code Status: FULL Family Communication: no family present at time of exam Disposition Plan: stable for transfer to renal floor   Consultants: Cardiology - [facility]  P[facility]M Vasc Surgery   Antibiotics: none  DVT prophylaxis: SCDs  Objective: Blood pressure 165/61, pulse 67, temperature 97.9 F (36.6 C), temperature source Oral, resp. rate 16, height  (1.676 m), weight 64.9 kg (143 lb 1.3 oz), SpO2 96 %.  Intake/Output Summary (Last 24 hours) at 11/23/15 1711 Last data filed at 11/23/15 1459  Gross per 24 hour  Intake    150 ml  Output 1327.5 ml  Net -1177.5 ml   Exam: General: no evident resp distress - alert and conversant  Lungs: no wheezing - no focal crackles - good air movement throughout Cardiovascular: regular rate and rhythm w/o murmur gallop rub  Abdomen: nontender, nondistended, soft, bowel sounds positive, no rebound, no ascites, no appreciable mass Extremities: no significant cyanosis, clubbing, edema bilateral lower extremities persists   Data Reviewed: Basic Metabolic Panel:  Recent Labs Lab 11/18/15 0233  11/19/15 0855 11/20/15 0224 11/21/15 0249 11/22/15 0408 11/23/15 0230  NA 138  < > 139 141 139 139 138  K 4.0  < > 4.4 4.4 3.9 3.7 3.6  CL 93*  < > 97* 103 99* 102 101  CO2 25  < > GLUCOSE 163*  < > 95 71 115* 104* 91  BUN 137*  < > 83* 54* 78* 56* 69*  CREATININE 5.11*  < > 4.22* 3.98* 5.15* 4.53* 4.99*  CALCIUM 8.4*  < > 7.6* 8.1* 8.3* 8.4* 8.4*  MG 2.8*  --   --   --   --   --   --   PHOS 10.9*  < > 6.9* 5.8* 5.6* 4.3 5.3*  < > = values in this interval not displayed.  CBC:  Recent Labs Lab 11/19/15 0431 11/19/15 0855 11/20/15 0224 11/21/15 0249 11/23/15 0221  WBC 15.4* 14.1* 13.1* 12.6* 13.0*  HGB 9.1* 8.8* 9.1* 9.1* 9.6*  HCT 26.9* 25.1* 26.4* 26.7* 28.9*  MCV 87.3 86.6 88.9  89.0 88.1  PLT 203 206 207 222 197    Liver Function Tests:  Recent Labs Lab 11/17/15 0049  11/19/15 0855 11/20/15 0224 11/21/15 0249 11/22/15 0408 11/23/15 0230  AST 53*  --   --   --   --   --   --   ALT 20  --   --   --   --   --   --   ALKPHOS 77  --   --   --   --   --   --   BILITOT 1.4*  --   --   --   --   --   --   PROT 5.0*  --   --   --   --   --   --   ALBUMIN 2.6*  < > 2.4* 2.3* 2.2* 2.2* 2.1*  < > = values in this interval not displayed.   Cardiac Enzymes:  Recent Labs Lab 11/16/15 2043 11/17/15 0049 11/18/15 0233  TROPONINI 9.49* 11.13* 11.08*    CBG:  Recent Labs Lab 11/22/15 2232 11/23/15 0809 11/23/15 1232 11/23/15 1512 11/23/15 1526  GLUCAP 165* 98 79 69 143*    Recent Results (from the past 240 hour(s))  MRSA PCR Screening     Status: None   Collection Time: 11/16/15  7:45 PM  Result Value Ref Range Status   MRSA by PCR NEGATIVE NEGATIVE Final    Comment:        The GeneXpert MRSA Assay (FDA approved for NASAL specimens only), is one component of a comprehensive MRSA colonization surveillance program. It is not intended to diagnose MRSA infection nor to guide or monitor treatment for MRSA infections.      Studies:   Recent x-ray studies have been reviewed in detail by the Attending Physician  Scheduled Meds:  Scheduled Meds: . allopurinol  300 mg Oral Daily  . amLODipine  5 mg Oral Daily  . antiseptic oral rinse  7 mL Mouth Rinse BID  . aspirin EC  81 mg Oral QPM  . atorvastatin  80 mg Oral q1800  . calcitRIOL  0.5 mcg Oral Daily  . clopidogrel  75 mg Oral Daily  . cyanocobalamin  1,000 mcg Subcutaneous Daily  . darbepoetin (ARANESP) injection - NON-DIALYSIS  150 mcg Subcutaneous Q Wed-1800  . cefUROXime (ZINACEF) 1.5 GM IVPB      . dextrose      . feeding supplement  1 Container Oral TID BM  . insulin aspart  0-15 Units Subcutaneous TID WC  . insulin glargine  13 Units Subcutaneous QHS  . isosorbide mononitrate   60 mg Oral Daily  . lanthanum  1,000 mg Oral TID WC  . metoprolol succinate  25 mg Oral QHS  . multivitamin  1 tablet Oral QHS  .  pantoprazole  40 mg Oral Q1200  . QUEtiapine  12.5 mg Oral QHS    Time spent on care of this patient: 25 mins   [facility] T , MD   [facility] Office  [phone] Pager - Text Page per Loretha Stapler as per below:  On-Call/Text Page:      [email or web address]      password [email or web address]  If 7PM-7AM, please contact night-coverage [email or web address] Password [email or web address] 11/23/2015, 5:11 PM   LOS: 7 days

## 2015-11-23 NOTE — Progress Notes (Signed)
Patient ID: [name], male   DOB: 02-Apr-1930, 82 y.o.   MRN: [id number] S:Mr. Helt has changed his mind about hemodialysis again.  He is willing to try it while sitting in a recliner but refuses to get in a bed. O:BP 177/57 mmHg  Pulse 72  Temp(Src) 98.2 F (36.8 C) (Oral)  Resp 16  Ht  (1.676 m)  Wt 64.9 kg (143 lb 1.3 oz)  BMI 23.10 kg/m2  SpO2 96%  Intake/Output Summary (Last 24 hours) at 11/23/15 1223 Last data filed at 11/23/15 0900  Gross per 24 hour  Intake      0 ml  Output    976 ml  Net   -976 ml   Intake/Output: I/O last 3 completed shifts: In: 240 [P.O.:240] Out: 976 [Urine:975; Stool:1]  Intake/Output this shift:    Weight change: -2.4 kg (-5 lb 4.7 oz) WUJ:WJXBJ elderly WM in NAd CVS:no rub Resp:occ rhonchi YNW:GNFAOZ Ext:no edema   Recent Labs Lab 11/17/15 0049 11/18/15 0233 11/19/15 0431 11/19/15 0855 11/20/15 0224 11/21/15 0249 11/22/15 0408 11/23/15 0230  NA 139 138 140 139 141 139 139 138  K 4.2 4.0 4.8 4.4 4.4 3.9 3.7 3.6  CL 94* 93* 100* 97* 103 99* 102 101  CO2 GLUCOSE 181* 163* 131* 95 71 115* 104* 91  BUN 122* 137* 78* 83* 54* 78* 56* 69*  CREATININE 4.07* 5.11* 4.08* 4.22* 3.98* 5.15* 4.53* 4.99*  ALBUMIN 2.6* 2.5* 2.6* 2.4* 2.3* 2.2* 2.2* 2.1*  CALCIUM 8.7* 8.4* 7.9* 7.6* 8.1* 8.3* 8.4* 8.4*  PHOS 11.0* 10.9* 7.6* 6.9* 5.8* 5.6* 4.3 5.3*  AST 53*  --   --   --   --   --   --   --   ALT 20  --   --   --   --   --   --   --    Liver Function Tests:  Recent Labs Lab 11/17/15 0049  11/21/15 0249 11/22/15 0408 11/23/15 0230  AST 53*  --   --   --   --   ALT 20  --   --   --   --   ALKPHOS 77  --   --   --   --   BILITOT 1.4*  --   --   --   --   PROT 5.0*  --   --   --   --   ALBUMIN 2.6*  < > 2.2* 2.2* 2.1*  < > = values in this interval not displayed. No results for input(s): LIPASE, AMYLASE in the last 168 hours.  Recent Labs Lab 11/21/15 0249  AMMONIA 27   CBC:  Recent Labs Lab  11/19/15 0431 11/19/15 0855 11/20/15 0224 11/21/15 0249 11/23/15 0221  WBC 15.4* 14.1* 13.1* 12.6* 13.0*  HGB 9.1* 8.8* 9.1* 9.1* 9.6*  HCT 26.9* 25.1* 26.4* 26.7* 28.9*  MCV 87.3 86.6 88.9 89.0 88.1  PLT 203 206 207 222 197   Cardiac Enzymes:  Recent Labs Lab 11/16/15 1335 11/16/15 2043 11/17/15 0049 11/18/15 0233  TROPONINI 8.35* 9.49* 11.13* 11.08*   CBG:  Recent Labs Lab 11/22/15 1208 11/22/15 1306 11/22/15 1601 11/22/15 2232 11/23/15 0809  GLUCAP 224* 220* 144* 165* 98    Iron Studies: No results for input(s): IRON, TIBC, TRANSFERRIN, FERRITIN in the last 72 hours. Studies/Results: No results found. Marland Kitchen allopurinol  300 mg Oral Daily  . amLODipine  5 mg  Oral Daily  . antiseptic oral rinse  7 mL Mouth Rinse BID  . aspirin EC  81 mg Oral QPM  . atorvastatin  80 mg Oral q1800  . calcitRIOL  0.5 mcg Oral Daily  . Chlorhexidine Gluconate Cloth  6 each Topical Once  . clopidogrel  75 mg Oral Daily  . cyanocobalamin  1,000 mcg Subcutaneous Daily  . darbepoetin (ARANESP) injection - NON-DIALYSIS  150 mcg Subcutaneous Q Wed-1800  . feeding supplement  1 Container Oral TID BM  . insulin aspart  0-15 Units Subcutaneous TID WC  . insulin glargine  13 Units Subcutaneous QHS  . isosorbide mononitrate  60 mg Oral Daily  . lanthanum  1,000 mg Oral TID WC  . metoprolol succinate  25 mg Oral QHS  . multivitamin  1 tablet Oral QHS  . pantoprazole  40 mg Oral Q1200  . QUEtiapine  12.5 mg Oral QHS    BMET    Component Value Date/Time   NA 138 11/23/2015 0230   NA 142 09/28/2015   K 3.6 11/23/2015 0230   CL 101 11/23/2015 0230   CO2 25 11/23/2015 0230   GLUCOSE 91 11/23/2015 0230   BUN 69* 11/23/2015 0230   BUN 102* 09/28/2015   CREATININE 4.99* 11/23/2015 0230   CREATININE 2.8* 09/28/2015   CALCIUM 8.4* 11/23/2015 0230   GFRNONAA 10* 11/23/2015 0230   GFRAA 11* 11/23/2015 0230   CBC    Component Value Date/Time   WBC 13.0* 11/23/2015 0221   WBC 7.4  09/28/2015   WBC 7.4 09/28/2015   RBC 3.28* 11/23/2015 0221   HGB 9.6* 11/23/2015 0221   HCT 28.9* 11/23/2015 0221   HCT 22.0* 11/16/2015 2043   PLT 197 11/23/2015 0221   MCV 88.1 11/23/2015 0221   MCH 29.3 11/23/2015 0221   MCHC 33.2 11/23/2015 0221   RDW 16.1* 11/23/2015 0221   LYMPHSABS 1.6 09/18/2015 0420   MONOABS 0.8 09/18/2015 0420   EOSABS 0.3 09/18/2015 0420   BASOSABS 0.0 09/18/2015 0420    Assessment/Plan:  1. AKI/CKD stage 5- in setting of IV contrast and ABLA. Is much more awake and alert today and is now reversing his decision from 11/22/15 to stop HD.  He wants to sit in a recliner and see how he does (he could not tolerate lying in bed).  He is for AVF today and will plan for HD tomorrow in a recliner and see how he does.  If he does not think he could tolerate it 3 days a week would ask palliative care to come back and speak with him regarding moving forward with hospice.  He has had a similar presentation in the past and was able to recover enough kidney function that he did not require HD so it is possible he may improve but his has chronic CKD stage 5 at baseline.  2. Anemia- improving with esa 3. CAD- stable 4. Vascular access- for AVF today and will need tunneled HD cath as well.  Appreciate VVS's patience. 5. Protein malnutrition 6. CHF acute on chronic- resolved 7. AMS- resolved 8. Dispo- pending his toleration of HD in chair  Jomarie Longs A Cathaleen Korol

## 2015-11-23 NOTE — Transfer of Care (Signed)
Immediate Anesthesia Transfer of Care Note  Patient: [name]  Procedure(s) Performed: Procedure(s): INSERTION OF DIALYSIS CATHETER RIGHT INTERNAL JUGULAR (Right)  Patient Location: PACU  Anesthesia Type:MAC  Level of Consciousness: awake, alert  and oriented  Airway & Oxygen Therapy: Patient Spontanous Breathing  Post-op Assessment: Report given to RN, Post -op Vital signs reviewed and stable and Patient moving all extremities X 4  Post vital signs: Reviewed and stable  Last Vitals:  Filed Vitals:   11/23/15 0815 11/23/15 1234  BP: 177/57 160/87  Pulse: 72 76  Temp: 36.8 C 36.8 C  Resp: 16 14    Complications: No apparent anesthesia complications

## 2015-11-23 NOTE — Anesthesia Postprocedure Evaluation (Signed)
Anesthesia Post Note  Patient: [name]  Procedure(s) Performed: Procedure(s) (LRB): INSERTION OF DIALYSIS CATHETER RIGHT INTERNAL JUGULAR (Right)  Patient location during evaluation: PACU Anesthesia Type: General Level of consciousness: awake and alert Pain management: pain level controlled Vital Signs Assessment: post-procedure vital signs reviewed and stable Respiratory status: spontaneous breathing, nonlabored ventilation and respiratory function stable Cardiovascular status: blood pressure returned to baseline and stable Postop Assessment: no signs of nausea or vomiting Anesthetic complications: no    Last Vitals:  Filed Vitals:   11/23/15 1555 11/23/15 1633  BP: 141/52 165/61  Pulse: 59 67  Temp:  36.6 C  Resp: 12 16    Last Pain:  Filed Vitals:   11/23/15 1634  PainSc: 0-No pain                 Noreene Boreman A

## 2015-11-23 NOTE — H&P (View-Only) (Signed)
Hospital Consult    Reason for Consult:  In need of permanent HD access and PC Referring Physician:  Deterding  MRN #:  [id number]  History of Present Illness: This is a 82 y.o. male who was seen at [facility] on 10/05/15 by Dr. Fields at which time he was scheduled for a right brachiocephalic AVF on 10/12/15.  The duplex ultrasound revealed normal brachial artery anatomic pattern with normal artery diameter of 4 mm at the antecubital area , vein mapping shows cephalic vein 3-4 mm in the upper arm small at the forearm bilaterally also small in the left upper arm basilic vein is 4 mm bilaterally from the upper arm.  The pt decided against the surgery and the surgery was cancelled.    On 11/16/15, the pt presented to the hospital with chest tightness.  He was found to be tachycardic, had diffuse ST segment depresion in the inferior and lateral leads and troponins were elevated.  He was taken to the cardiac catheterization emergently.   He was found to have no significant high grade lesion and his EF ~45%.  70cc of contrast was used.   It was suspected his presentation was related to demand ischemia from severe anemia, metabolic issues and underlying medical comorbidities. Dr. Ganji discussed the findings with the patient's family, daughter and son, patient has not been taking Procrit shots due to financial issues. He has not had any obvious GI bleed. He was transfused 2 units PRBC's and IVF reduced due to diastolic heart failure in the past.    Echocardiogram 11/16/2015: LV cavity size was normal. Mild concentric hypertrophy. LVEF50% to 55%. Mild diffuse hypokinesis. Grade 2 diastolic dysfunction. Doppler parameters areconsistent with both elevated ventricular end-diastolic fillingpressure and elevated left atrial filling pressure. Mitral valve: Calcified annulus. Atrial septum: No defect or patent foramen ovale was identified. Pulmonary arteries: PA peak pressure: 75 mm Hg (S). Pericardium, extracardiac:  A small pericardial effusion wasidentified. The fluid had no internal echoes.There was no evidence of hemodynamic compromise.  On 11/18/15,  He did have acute hypoxic respiratory failure likely due to pulmonary edema in setting of volume overload following LHC and transfusion with hx of CHF and CKD.  He responded well to BiPap and NTG gtt while in ICU.  He did have a non tunneled right IJ catheter placed by IR on 11/18/15 and has since been dialyzing per renal service.  He is now in the stepdown unit.  [facility] has been consulted for placement of perm cath and permanent HD access.   He does have a hx of diabetes and is on Januvia.  He does have htn and is on a CCB, BB, and hydralazine.  He is on a statin for cholesterol management.    Hx of CEA by Dr. Hayes in 2005.  Hx of PTA and stenting of the left distal common iliac artery by Dr. Berry 11/01/14.  The pt is on Plavix & Aspirin.   Past Medical History  Diagnosis Date  . Diabetes mellitus   . Hypertension   . Hypercholesteremia   . Gout   . PVD (peripheral vascular disease) (HCC) 7/05    LCE, known 60% RICA 7/12  . CAD (coronary artery disease) 2002    non critical  . Macular degeneration   . Chronic renal disease, stage III   . Claudication (HCC)   . Palpitations     PVCs and PSVT on Holter monitoring  . Bilateral carotid artery disease (HCC)     status post   left carotid endarterectomy performed by Dr. Hayes 05/12/04  . Anemia     Past Surgical History  Procedure Laterality Date  . 2d echocardiogram  03/31/2008    EF greater than 55%  . Cardiovascular stress test  06/30/2010    Nonischemic. Low risk  . Cerebral angiogram  04/03/2004    High-grade 80% ostial L ICA stenosis. Medical management.  . Abdominal aortogram  09/01/2007    Widely patent renal arteries. Medical management.  . Peripheral vascular angiogram  05/07/2011    No evidence of intracranial occlusions, stenosis, dissections, or aneurysms seen  . Carotid doppler   06/09/2012    R Vertebral-known occluded vessel, R Bulb/Proximal ICA-moderate to severe amount of plaque w/50-69% diameter reduction, L Subclavian-50-69% diameter reduction, L CEA-demonstrated increased velocities w/o evidence of hemodynamically significant stenosis  . Cardiac catheterization  10/17/2001    No significant CAD, normal LV systolic function.  . Carotid endarterectomy Left July 2005  . Lower extremity angiogram Bilateral 11/01/2014    Procedure: LOWER EXTREMITY ANGIOGRAM;  Surgeon: Jonathan J Berry, MD;  Location: [facility]ATH [facility];  Service: Cardiovascular;  Laterality: Bilateral;  . Abdominal angiogram  11/01/2014    Procedure: ABDOMINAL ANGIOGRAM;  Surgeon: Jonathan J Berry, MD;  Location: [facility]ATH [facility];  Service: Cardiovascular;;  . Knee surgery    . Hernia repair    . Cardiac catheterization N/A 11/16/2015    Procedure: Left Heart Cath and Coronary Angiography;  Surgeon: Jay Ganji, MD;  Location: [facility] INVASIVE CV [facility];  Service: Cardiovascular;  Laterality: N/A;  . Cardiac catheterization Right 11/16/2015    Procedure: Intravascular Ultrasound/IVUS;  Surgeon: Jay Ganji, MD;  Location: [facility] CV [facility];  Service: Cardiovascular;  Laterality: Right;    Allergies  Allergen Reactions  . Labetalol Swelling    angioedema    Prior to Admission medications   Medication Sig Start Date End Date Taking? Authorizing Provider  allopurinol (ZYLOPRIM) 100 MG tablet TAKE 1 TABLET (100 MG TOTAL) BY MOUTH AT BEDTIME. 01/27/15  Yes Rhonda G Barrett, PA-C  amLODipine (NORVASC) 10 MG tablet Take 1 tablet (10 mg total) by mouth at bedtime. 09/24/15  Yes David Tat, MD  aspirin EC 81 MG tablet Take 81 mg by mouth every evening.   Yes Historical Provider, MD  atorvastatin (LIPITOR) 80 MG tablet Take 1 tablet (80 mg total) by mouth daily at 6 PM. 09/23/15  Yes David Tat, MD  calcitRIOL (ROCALTROL) 0.25 MCG capsule Take 0.25 mcg by mouth every morning.  05/27/13  Yes Historical Provider, MD  clopidogrel (PLAVIX)  75 MG tablet TAKE 1 TABLET BY MOUTH DAILY 11/01/14  Yes Jonathan J Berry, MD  ferrous sulfate 325 (65 FE) MG tablet Take 325 mg by mouth 2 (two) times daily with a meal.   Yes Historical Provider, MD  furosemide (LASIX) 80 MG tablet Take 1 tablet (80 mg total) by mouth daily. 09/24/15  Yes David Tat, MD  hydrALAZINE (APRESOLINE) 50 MG tablet Take 1 tablet (50 mg total) by mouth 2 (two) times daily. Patient taking differently: Take 50 mg by mouth 3 (three) times daily.  09/24/15  Yes David Tat, MD  isosorbide mononitrate (IMDUR) 60 MG 24 hr tablet Take 1 tablet (60 mg total) by mouth daily. 09/23/15  Yes David Tat, MD  metoprolol succinate (TOPROL-XL) 50 MG 24 hr tablet Take 1 tablet (50 mg total) by mouth daily. Take with or immediately following a meal. 09/24/15  Yes David Tat, MD  Multiple Vitamins-Minerals (PRESERVISION AREDS PO) Take   2 tablets by mouth daily.   Yes Historical Provider, MD  pantoprazole (PROTONIX) 40 MG tablet Take 1 tablet (40 mg total) by mouth daily. 03/29/15  Yes Belkys A Regalado, MD  polyethylene glycol (MIRALAX / GLYCOLAX) packet Take 17 g by mouth daily as needed for mild constipation. 09/24/15  Yes David Tat, MD  sitaGLIPtin (JANUVIA) 25 MG tablet Take 1 tablet (25 mg total) by mouth daily. 05/03/15  Yes Ajay Kumar, MD  ACCU-CHEK FASTCLIX LANCETS MISC Use to check blood sugar 2 times per day dx code E11.9 07/20/15   Ajay Kumar, MD  feeding supplement, RESOURCE BREEZE, (RESOURCE BREEZE) LIQD Take 1 Container by mouth 3 (three) times daily between meals. Patient not taking: Reported on 11/16/2015 03/29/15   Belkys A Regalado, MD  glucose blood (ACCU-CHEK AVIVA PLUS) test strip Use as instructed to check blood sugar 2 times per day dx code E11.9 06/01/15   Ajay Kumar, MD    Social History   Social History  . Marital Status: Widowed    Spouse Name: N/A  . Number of Children: N/A  . Years of Education: N/A   Occupational History  . Not on file.   Social History Main Topics    . Smoking status: Former Smoker -- 1.00 packs/day for 40 years  . Smokeless tobacco: Never Used     Comment: quit approx. 20 years ago.  . Alcohol Use: No  . Drug Use: Not on file  . Sexual Activity: No   Other Topics Concern  . Not on file   Social History Narrative     Family History  Problem Relation Age of Onset  . Stroke Sister   . Heart disease Brother   . Diabetes Neg Hx     ROS: [x] Positive   [ ] Negative   [ ] All sytems reviewed and are negative  Cardiovascular: [x] chest pain/pressure [x] hx of left CIA stenting by Dr. Berry January 2016 [] palpitations [] SOB lying flat [] DOE [] pain in legs while walking [] pain in legs at rest [] pain in legs at night [] non-healing ulcers [] hx of DVT [x] swelling in legs  Pulmonary: [] productive cough [] asthma/wheezing [x] COPD [] home O2  Neurologic: [] weakness in [] arms [] legs [] numbness in [] arms [] legs [] hx of CVA [] mini stroke []difficulty speaking or slurred speech [] temporary loss of vision in one eye [] dizziness  Hematologic: [] hx of cancer [] bleeding problems [] problems with blood clotting easily [x] hx pernicious anemia  Endocrine:   [x] diabetes [] thyroid disease  GI [] vomiting blood [] blood in stool  GU: [x] CKD/renal failure [x] HD-new-[] M/W/F or [] T/T/S [] burning with urination [] blood in urine  Psychiatric: [] anxiety [] depression  Musculoskeletal: [] arthritis [] joint pain [x] gout  Integumentary: [] rashes [] ulcers  Constitutional: [] fever [] chills   Physical Examination  Filed Vitals:   11/21/15 1100 11/21/15 1232  BP:  144/40  Pulse: 85 85  Temp:  99.2 F (37.3 C)  Resp: 14 16   Body mass index is 23.96 kg/(m^2).  General:  WDWN in NAD Gait: Not observed HENT: WNL, normocephalic Pulmonary: normal non-labored breathing, without Rales, rhonchi,  wheezing Cardiac: irregular, without  Murmurs, rubs or gallops; without left  carotid bruit (unable to listen to right due to line placement) Abdomen:  soft, NT/ND, no masses Skin: without rashes Vascular Exam/Pulses:    Right Left  Radial 2+ (normal) 2+ (normal)  DP 2+ (normal) 2+ (normal)  PT 2+ (normal) 2+ (normal)   Extremities: without ischemic changes, without Gangrene , without cellulitis; without open wounds;  Musculoskeletal: no muscle wasting or atrophy  Neurologic: A&O X 3; Appropriate Affect ; SENSATION: normal; MOTOR FUNCTION:  moving all extremities equally. Speech is fluent/normal   CBC    Component Value Date/Time   WBC 12.6* 11/21/2015 0249   WBC 7.4 09/28/2015   WBC 7.4 09/28/2015   RBC 3.00* 11/21/2015 0249   HGB 9.1* 11/21/2015 0249   HCT 26.7* 11/21/2015 0249   HCT 22.0* 11/16/2015 2043   PLT 222 11/21/2015 0249   MCV 89.0 11/21/2015 0249   MCH 30.3 11/21/2015 0249   MCHC 34.1 11/21/2015 0249   RDW 16.8* 11/21/2015 0249   LYMPHSABS 1.6 09/18/2015 0420   MONOABS 0.8 09/18/2015 0420   EOSABS 0.3 09/18/2015 0420   BASOSABS 0.0 09/18/2015 0420    BMET    Component Value Date/Time   NA 139 11/21/2015 0249   NA 142 09/28/2015   K 3.9 11/21/2015 0249   CL 99* 11/21/2015 0249   CO2 24 11/21/2015 0249   GLUCOSE 115* 11/21/2015 0249   BUN 78* 11/21/2015 0249   BUN 102* 09/28/2015   CREATININE 5.15* 11/21/2015 0249   CREATININE 2.8* 09/28/2015   CALCIUM 8.3* 11/21/2015 0249   GFRNONAA 9* 11/21/2015 0249   GFRAA 11* 11/21/2015 0249    COAGS: Lab Results  Component Value Date   INR 1.13 11/16/2015   INR 1.35 09/11/2015   INR 1.25 09/11/2015     Non-Invasive Vascular Imaging:   duplex ultrasound revealed normal brachial artery anatomic pattern with normal artery diameter of 4 mm at the antecubital area , vein mapping shows cephalic vein 3-4 mm in the upper arm small at the forearm bilaterally also small in the left upper arm basilic vein is 4 mm bilaterally from the upper arm.  Statin:  Yes.   Beta Blocker:  Yes.     Aspirin:  Yes.   ACEI:  No. ARB:  No. Other antiplatelets/anticoagulants:  Yes.   Plavix   ASSESSMENT/PLAN: This is a 82 y.o. male with acute on chronic renal failure in need of permanent HD access and perm cath.   -when pt had been seen in the office last month by Dr. Fields, a right brachiocephalic AVF was scheduled and pt cancelled surgery. -his CKD has now progressed and he is in need of a tunneled dialysis catheter and permanent access, however, after speaking with the pt, he is not sure he wants to even continue with dialysis and has many questions.  He would like to further discuss with his daughter tonight and Dr. Deterding tomorrow.   -if he decides he does want to proceed, cardiology would also need to clear the pt for surgery given his recent MI. -for now, we will hold off on access.    Samantha Rhyne, PA-C [facility] [phone] Agree with above assessment Patient is not ready to commit to hemodialysis at this time If she should decide to proceed-we may consider tunneled hemodialysis catheter at this point and wait a few weeks for the fistula creation since he was admitted with non-STEMI on 11/16/2015. We'll follow along with you  

## 2015-11-23 NOTE — Progress Notes (Signed)
Called Dr.Lawson and said to keep pt NPO they will see if they will be able to put pt on schedule today,patient and family was informed,consent was signed.

## 2015-11-23 NOTE — Interval H&P Note (Signed)
History and Physical Interval Note:  11/23/2015 1:42 PM  [name]  has presented today for surgery, with the diagnosis of CRD  The various methods of treatment have been discussed with the patient and family. After consideration of risks, benefits and other options for treatment, the patient has consented to  Procedure(s): INSERTION OF DIALYSIS CATHETER (N/A) as a surgical intervention .  The patient's history has been reviewed, patient examined, no change in status, stable for surgery.  I have reviewed the patient's chart and labs.  Questions were answered to the patient's satisfaction.     Fabienne Bruns

## 2015-11-23 NOTE — Op Note (Signed)
Procedure: Insertion of Palindrome dialysis catheter  Preoperative diagnosis: End-stage renal disease  Postoperative diagnosis: Same  Anesthesia: Local with IV sedation  Operative findings: 19 cm Diatek catheter right internal jugular vein  Operative details: After obtaining informed consent, the patient was taken to the operating room. The patient was placed in supine position on the operating room table. After adequate sedation the patient's entire neck and chest were prepped and draped in usual sterile fashion. The patient was placed in Trendelenburg position. Local anesthesia was infiltrated over the right jugular vein.  The patient had a preexisting right side trialysis catheter.    The distal tip of the catheter was cannulated with an 0.035 J-tipped guidewire and threaded into the right internal jugular vein and into the superior vena cava followed by the inferior vena cava under fluoroscopic guidance.   Next sequential 12 and 14 dilators were placed over the guidewire into the right atrium.  A 16 French dilator with a peel-away sheath was then placed over the guidewire into the right atrium.   The guidewire and dilator were removed. A 19 cm Palindrome catheter was then placed through the peel away sheath into the right atrium.  The catheter was then tunneled subcutaneously, cut to length, and the hub attached. The catheter was noted to flush and draw easily. The catheter was inspected under fluoroscopy and found with its tip to be in the right atrium without any kinks throughout its course. The catheter was sutured to the skin with nylon sutures. The neck insertion site was closed with Vicryl stitch. The old exit site was closed with a nylon stitch.  The catheter was then loaded with concentrated Heparin solution. A dry sterile dressing was applied.  The patient tolerated procedure well and there were no complications. Instrument sponge and needle counts correct in the case. The patient was taken  to the recovery room in stable condition. Chest x-ray will be obtained in the recovery room.  Fabienne Bruns, MD

## 2015-11-24 ENCOUNTER — Encounter (HOSPITAL_COMMUNITY): Payer: Self-pay | Admitting: Vascular Surgery

## 2015-11-24 DIAGNOSIS — J810 Acute pulmonary edema: Secondary | ICD-10-CM | POA: Insufficient documentation

## 2015-11-24 DIAGNOSIS — I5032 Chronic diastolic (congestive) heart failure: Secondary | ICD-10-CM

## 2015-11-24 DIAGNOSIS — D638 Anemia in other chronic diseases classified elsewhere: Secondary | ICD-10-CM | POA: Insufficient documentation

## 2015-11-24 DIAGNOSIS — F0390 Unspecified dementia without behavioral disturbance: Secondary | ICD-10-CM | POA: Insufficient documentation

## 2015-11-24 DIAGNOSIS — I739 Peripheral vascular disease, unspecified: Secondary | ICD-10-CM

## 2015-11-24 LAB — CBC
HCT: 28.6 % — ABNORMAL LOW (ref 39.0–52.0)
Hemoglobin: 9.2 g/dL — ABNORMAL LOW (ref 13.0–17.0)
MCH: 28.8 pg (ref 26.0–34.0)
MCHC: 32.2 g/dL (ref 30.0–36.0)
MCV: 89.4 fL (ref 78.0–100.0)
PLATELETS: 185 10*3/uL (ref 150–400)
RBC: 3.2 MIL/uL — ABNORMAL LOW (ref 4.22–5.81)
RDW: 16.3 % — AB (ref 11.5–15.5)
WBC: 10.4 10*3/uL (ref 4.0–10.5)

## 2015-11-24 LAB — GLUCOSE, CAPILLARY
GLUCOSE-CAPILLARY: 363 mg/dL — AB (ref 65–99)
Glucose-Capillary: 182 mg/dL — ABNORMAL HIGH (ref 65–99)
Glucose-Capillary: 83 mg/dL (ref 65–99)
Glucose-Capillary: 163 mg/dL — ABNORMAL HIGH (ref 65–99)
Glucose-Capillary: 89 mg/dL (ref 65–99)

## 2015-11-24 LAB — RENAL FUNCTION PANEL
Albumin: 1.9 g/dL — ABNORMAL LOW (ref 3.5–5.0)
Anion gap: 13 (ref 5–15)
BUN: 78 mg/dL — AB (ref 6–20)
CALCIUM: 8 mg/dL — AB (ref 8.9–10.3)
CHLORIDE: 103 mmol/L (ref 101–111)
CO2: 25 mmol/L (ref 22–32)
CREATININE: 4.68 mg/dL — AB (ref 0.61–1.24)
GFR calc Af Amer: 12 mL/min — ABNORMAL LOW (ref >60)
GFR, EST NON AFRICAN AMERICAN: 10 mL/min — AB (ref >60)
Glucose, Bld: 133 mg/dL — ABNORMAL HIGH (ref 65–99)
Phosphorus: 6.7 mg/dL — ABNORMAL HIGH (ref 2.5–4.6)
Potassium: 3.3 mmol/L — ABNORMAL LOW (ref 3.5–5.1)
SODIUM: 141 mmol/L (ref 135–145)

## 2015-11-24 MED ORDER — DARBEPOETIN ALFA 150 MCG/0.3ML IJ SOSY
150.0000 ug | PREFILLED_SYRINGE | INTRAMUSCULAR | Status: DC
Start: 2015-11-30 — End: 2015-11-26

## 2015-11-24 MED ORDER — SODIUM CHLORIDE 0.9 % IV SOLN
INTRAVENOUS | Status: DC
  Administered 2015-11-24 – 2015-11-25 (×2): via INTRAVENOUS

## 2015-11-24 NOTE — Progress Notes (Addendum)
Admission note:   Arrival Method: Transfer from [facility]. Mental Status: A&OX4. Telemetry: N/A.  Skin: Intact. Sacrum/buttocks reddened, barely blanchable.  Tubes: Right chest tunneled dialysis catheter. IV: RW NSL, LFA NS@50 . Pain: Denies.  Family: No one at bedside. Living Situation: From home.  Safety Measures: Bed alarm on middle setting. Instructed to call for assistance. 6E Orientation: Call bell within reach. Oriented to unit and surroundings.  Leanna Battles, RN.

## 2015-11-24 NOTE — Progress Notes (Signed)
Patient ID: [name], male   DOB: 1930/01/31, 82 y.o.   MRN: [id number] S:Feels "great" better than he has in a while. O:BP 162/51 mmHg  Pulse 67  Temp(Src) 98 F (36.7 C) (Oral)  Resp 17  Ht  (1.676 m)  Wt 62.8 kg (138 lb 7.2 oz)  BMI 22.36 kg/m2  SpO2 97%  Intake/Output Summary (Last 24 hours) at 11/24/15 0744 Last data filed at 11/23/15 2330  Gross per 24 hour  Intake    510 ml  Output  876.5 ml  Net -366.5 ml   Intake/Output: I/O last 3 completed shifts: In: 510 [P.O.:360; I.V.:150] Out: 1852.5 [Urine:1850; Stool:1; Blood:1.5]  Intake/Output this shift:    Weight change: -2.1 kg (-4 lb 10.1 oz) Gen:WD elderly WM in AND CVS:no rub Resp:occ rhonchi WUJ:WJXBJY Ext:no edema   Recent Labs Lab 11/19/15 0431 11/19/15 0855 11/20/15 0224 11/21/15 0249 11/22/15 0408 11/23/15 0230 11/24/15 0237  NA 140 139 141 139 139 138 141  K 4.8 4.4 4.4 3.9 3.7 3.6 3.3*  CL 100* 97* 103 99* 102 101 103  CO2 GLUCOSE 131* 95 71 115* 104* 91 133*  BUN 78* 83* 54* 78* 56* 69* 78*  CREATININE 4.08* 4.22* 3.98* 5.15* 4.53* 4.99* 4.68*  ALBUMIN 2.6* 2.4* 2.3* 2.2* 2.2* 2.1* 1.9*  CALCIUM 7.9* 7.6* 8.1* 8.3* 8.4* 8.4* 8.0*  PHOS 7.6* 6.9* 5.8* 5.6* 4.3 5.3* 6.7*   Liver Function Tests:  Recent Labs Lab 11/22/15 0408 11/23/15 0230 11/24/15 0237  ALBUMIN 2.2* 2.1* 1.9*   No results for input(s): LIPASE, AMYLASE in the last 168 hours.  Recent Labs Lab 11/21/15 0249  AMMONIA 27   CBC:  Recent Labs Lab 11/19/15 0855 11/20/15 0224 11/21/15 0249 11/23/15 0221 11/24/15 0237  WBC 14.1* 13.1* 12.6* 13.0* 10.4  HGB 8.8* 9.1* 9.1* 9.6* 9.2*  HCT 25.1* 26.4* 26.7* 28.9* 28.6*  MCV 86.6 88.9 89.0 88.1 89.4  PLT 206 207 222 197 185   Cardiac Enzymes:  Recent Labs Lab 11/18/15 0233  TROPONINI 11.08*   CBG:  Recent Labs Lab 11/23/15 1232 11/23/15 1512 11/23/15 1526 11/23/15 1628 11/23/15 2117  GLUCAP 79 69 143* 108* 161*    Iron  Studies: No results for input(s): IRON, TIBC, TRANSFERRIN, FERRITIN in the last 72 hours. Studies/Results: Dg Chest Port 1 View  11/23/2015  CLINICAL DATA:  Post Diatek catheter placement EXAM: PORTABLE CHEST 1 VIEW COMPARISON:  Portable exam 1515 hours compared to 11/18/2015 FINDINGS: New large bore dual-lumen RIGHT jugular central venous catheter with tip projecting over RIGHT atrium. Enlargement of cardiac silhouette with pulmonary vascular congestion. Perihilar infiltrates likely pulmonary edema, improved. No pleural effusion or pneumothorax. Bones unremarkable. IMPRESSION: Improved pulmonary edema. No pneumothorax following jugular line placement. Electronically Signed   By: Ulyses Southward M.D.   On: 11/23/2015 15:24   Dg Fluoro Guide Cv Line-no Report  11/23/2015  CLINICAL DATA:  FLOURO GUIDE CV LINE Fluoroscopy was utilized by the requesting physician.  No radiographic interpretation.   Marland Kitchen allopurinol  300 mg Oral Daily  . amLODipine  5 mg Oral Daily  . antiseptic oral rinse  7 mL Mouth Rinse BID  . aspirin EC  81 mg Oral QPM  . atorvastatin  80 mg Oral q1800  . calcitRIOL  0.5 mcg Oral Daily  . clopidogrel  75 mg Oral Daily  . cyanocobalamin  1,000 mcg Subcutaneous Daily  . darbepoetin (ARANESP) injection - NON-DIALYSIS  150 mcg  Subcutaneous Q Wed-1800  . feeding supplement  1 Container Oral TID BM  . insulin aspart  0-15 Units Subcutaneous TID WC  . insulin glargine  13 Units Subcutaneous QHS  . isosorbide mononitrate  60 mg Oral Daily  . lanthanum  1,000 mg Oral TID WC  . metoprolol succinate  25 mg Oral QHS  . multivitamin  1 tablet Oral QHS  . pantoprazole  40 mg Oral Q1200  . QUEtiapine  12.5 mg Oral QHS    BMET    Component Value Date/Time   NA 141 11/24/2015 0237   NA 142 09/28/2015   K 3.3* 11/24/2015 0237   CL 103 11/24/2015 0237   CO2 25 11/24/2015 0237   GLUCOSE 133* 11/24/2015 0237   BUN 78* 11/24/2015 0237   BUN 102* 09/28/2015   CREATININE 4.68* 11/24/2015  0237   CREATININE 2.8* 09/28/2015   CALCIUM 8.0* 11/24/2015 0237   GFRNONAA 10* 11/24/2015 0237   GFRAA 12* 11/24/2015 0237   CBC    Component Value Date/Time   WBC 10.4 11/24/2015 0237   WBC 7.4 09/28/2015   WBC 7.4 09/28/2015   RBC 3.20* 11/24/2015 0237   HGB 9.2* 11/24/2015 0237   HCT 28.6* 11/24/2015 0237   HCT 22.0* 11/16/2015 2043   PLT 185 11/24/2015 0237   MCV 89.4 11/24/2015 0237   MCH 28.8 11/24/2015 0237   MCHC 32.2 11/24/2015 0237   RDW 16.3* 11/24/2015 0237   LYMPHSABS 1.6 09/18/2015 0420   MONOABS 0.8 09/18/2015 0420   EOSABS 0.3 09/18/2015 0420   BASOSABS 0.0 09/18/2015 0420   Procedures performed by Dr. Jacinto Halim on 11/16/15   Intravascular Ultrasound/IVUS   Left Heart Cath and Coronary Angiography    Conclusion    1. LVEF appears to be about 45%. Needs echocardiogram. 2. Diffuse coronary calcification, there was damping of the pressures across the left main coronary artery and high-grade lesion was suspected. IVUS performed revealing no hemodynamically significant stenosis. Mild scattered disease throughout the left and right coronary arteries.  Impression: Presentation more consistent with demand ischemia, severe anemia and metabolic issues. Will be admitted to ICU for further evaluation. A total of 60 mL of contrast was utilized for diagnostic angiography.     Assessment/Plan:  1. AKI/CKD stage 5- in setting of IV contrast and ABLA. Is much more awake and alert today and is now reversing his decision from 11/22/15 to stop HD. He wants to sit in a recliner and see how he does (he could not tolerate lying in bed).  1. Will hold off on HD for now but any future HD will be in a recliner and see how he does. If he does not think he could tolerate it 3 days a week would ask palliative care to come back and speak with him regarding moving forward with hospice.  2. He has had a similar presentation in the past and was able to recover enough kidney function  that he did not require HD so it is possible he may improve and his Scr did improve over the last 24 hours, however he still has chronic CKD stage 5 at baseline.  3. Will hold off another 24 hours on HD and follow UOP and Scr due to improvement of both after some gentle hydration. 2. Anemia- improving with esa 3. CAD- stable with negative cath.  Elevated troponin likely due to demand ischemia related to severe anemia per cardiology.  Unfortunately his AVF placement is on hold due to his recent  elevated troponin/NSTEMI although cath was negative. 4. Vascular access- was for AVF yesterday but only received a tunneled HD (11/23/15) due to concerns about recent elevated troponins, although cardiac cath was not unrevealing for significant CAD.  Will require AVF in the near future. Appreciate [facility]'s patience. 5. Protein malnutrition 6. CHF acute on chronic- resolved 7. AMS- resolved 8. Dispo- pending his need/toleration of HD in chair  [employer]

## 2015-11-24 NOTE — Progress Notes (Signed)
[facility] TEAM 1 - Stepdown/ICU TEAM Progress Note  [name]:[id number] DOB: Aug 19, 1930 DOA: 11/16/2015 PCP: Lupita Raider, MD  Admit HPI / Brief Narrative: 82 yo WM PMHx DM Type 2, HTN, HLD, gout, PVD, CAD native artery, Bilateral Carotid Artery Dz, Palpitations, CKD stage III,  who presented on 1/18 for chest pain found to have NSTEMI s/p LHC with onset of acute pulmonary edema after blood transfusion.  HPI/Subjective: 1/26  A/O 4, negative CP, negative SOB, negative N/V. States is hungry. States after talking with Dr. Terrial Rhodes Nephrology he has decided that he should go along with placement of the AV fistula. Patient on 1/25 did not receive AV fistula and per patient understands he will still need AV fistula in the future.   Assessment/Plan:  Acute on chronic CKD Stage 4 > 5/ESRD on HD -HD ongoing per Nephrolgoy - tolerating well thus far -1/24 Patient has agreed to placement of AV fistula. Reconsulted Vascular Surgery will see patient in A.m. -1/25 S/P right IJ tunneled HD cath  Acute pulmonary edema in setting of volume overload (CKD Stage 4 and CHF) -Volume control per HD - patient continues to make slow but consistent improvement -Contacted Dr. Terrial Rhodes Nephrology and informed him that patient had changes mind concerning AV fistula placement. Patient was to have tunneled HD cath and AV fistula at the same time, but appears vascular surgery deferred placing AV fistula and so later date.  NSTEMI with peak trop 18 -LHC with no significant obstructive disease, most likely demand ischemia in setting of acute on chronic anemia w/ small vessel disease  - Cardiology has now signed off - continue medical therapy  Acute on chronic grade 2 Diastolic CHF -TTE 11/16/15 - volume control per dialysis  -Strict in and out since admission -6.3 L -Daily weight admission weight= 75.2 kg             1/26 bed weight= 62.8 kg  Acute on chronic anemia of renal disease -no  active bleeding  - transfused 4U PRBC thus far - hemoglobin holding steady ~ 9  Hypertension -Amlodipine 5 mg daily -Imdur 60 mg daily -Metoprolol XL 25 mg QHS  Hyperlipidemia -Continue Lipitor 80 mg daily  PVD -left iliac artery stenting   Non-tophaceous gout -Presently quiescent  History of tobacco abuse  Hyperphosphatemia in setting of CKD Stage 4  DM Type 2  -09/22/15 hemoglobin A1c = 6.1  - Increase Lantus 13 units daily  -Continue moderate SSI   Secondary hyperparathyroidism in setting of CKD 4 Continue calcitriol 0.5 mcg daily   Dementia - Delirium with visual hallucinations  -Continue Seroquel 12.5 mg QHS    Code Status: FULL Family Communication: no family present at time of exam Disposition Plan: SNF?    Consultants: Dr.Joseph Coladonato nephrology PASamantha J Rhyne vascular surgery   Procedure/Significant Events: 1/25 S/P right IJ tunneled HD cath   Culture NA  Antibiotics: NA  DVT prophylaxis: SCD  Devices NA   LINES / TUBES:  NA    Continuous Infusions: . sodium chloride 50 mL/hr at 11/24/15 0821    Objective: VITAL SIGNS: Temp: 97.3 F (36.3 C) (01/26 1327) Temp Source: Oral (01/26 1327) BP: 145/46 mmHg (01/26 1800) Pulse Rate: 72 (01/26 1800) SPO2; FIO2:   Intake/Output Summary (Last 24 hours) at 11/24/15 1829 Last data filed at 11/24/15 1600  Gross per 24 hour  Intake  622.5 ml  Output    800 ml  Net -177.5 ml     Exam:  General: A/O 4, NAD, No acute respiratory distress Eyes: Negative headache, negative scleral hemorrhage ENT: Negative Runny nose, negative gingival bleeding, Neck:  Negative scars, masses, torticollis, lymphadenopathy, JVD Lungs: Clear to auscultation bilaterally without wheezes or crackles Cardiovascular: Regular rate and rhythm without murmur gallop or rub normal S1 and S2 Abdomen:negative abdominal pain, nondistended, positive soft, bowel sounds, no rebound, no ascites, no  appreciable mass Extremities: No significant cyanosis, clubbing, or edema bilateral lower extremities Psychiatric:  Negative depression, negative anxiety, negative fatigue, negative mania  Neurologic:  Cranial nerves II through XII intact, tongue/uvula midline, all extremities muscle strength 5/5, sensation intact throughout, negative dysarthria, negative expressive aphasia, negative receptive aphasia.   Data Reviewed: Basic Metabolic Panel:  Recent Labs Lab 11/18/15 0233  11/20/15 0224 11/21/15 0249 11/22/15 0408 11/23/15 0230 11/24/15 0237  NA 138  < > 141 139 139 138 141  K 4.0  < > 4.4 3.9 3.7 3.6 3.3*  CL 93*  < > 103 99* 102 101 103  CO2 25  < > GLUCOSE 163*  < > 71 115* 104* 91 133*  BUN 137*  < > 54* 78* 56* 69* 78*  CREATININE 5.11*  < > 3.98* 5.15* 4.53* 4.99* 4.68*  CALCIUM 8.4*  < > 8.1* 8.3* 8.4* 8.4* 8.0*  MG 2.8*  --   --   --   --   --   --   PHOS 10.9*  < > 5.8* 5.6* 4.3 5.3* 6.7*  < > = values in this interval not displayed. Liver Function Tests:  Recent Labs Lab 11/20/15 0224 11/21/15 0249 11/22/15 0408 11/23/15 0230 11/24/15 0237  ALBUMIN 2.3* 2.2* 2.2* 2.1* 1.9*   No results for input(s): LIPASE, AMYLASE in the last 168 hours.  Recent Labs Lab 11/21/15 0249  AMMONIA 27   CBC:  Recent Labs Lab 11/19/15 0855 11/20/15 0224 11/21/15 0249 11/23/15 0221 11/24/15 0237  WBC 14.1* 13.1* 12.6* 13.0* 10.4  HGB 8.8* 9.1* 9.1* 9.6* 9.2*  HCT 25.1* 26.4* 26.7* 28.9* 28.6*  MCV 86.6 88.9 89.0 88.1 89.4  PLT 206 207 222 197 185   Cardiac Enzymes:  Recent Labs Lab 11/18/15 0233  TROPONINI 11.08*   BNP (last 3 results)  Recent Labs  09/11/15 0955 11/16/15 1015  BNP 2200.4* 2120.5*    ProBNP (last 3 results) No results for input(s): PROBNP in the last 8760 hours.  CBG:  Recent Labs Lab 11/23/15 2117 11/24/15 0800 11/24/15 1320 11/24/15 1703 11/24/15 1728  GLUCAP 161* 182* 83 163* 363*    Recent Results (from  the past 240 hour(s))  MRSA PCR Screening     Status: None   Collection Time: 11/16/15  7:45 PM  Result Value Ref Range Status   MRSA by PCR NEGATIVE NEGATIVE Final    Comment:        The GeneXpert MRSA Assay (FDA approved for NASAL specimens only), is one component of a comprehensive MRSA colonization surveillance program. It is not intended to diagnose MRSA infection nor to guide or monitor treatment for MRSA infections.      Studies:  Recent x-ray studies have been reviewed in detail by the Attending Physician  Scheduled Meds:  Scheduled Meds: . allopurinol  300 mg Oral Daily  . amLODipine  5 mg Oral Daily  . antiseptic oral rinse  7 mL Mouth Rinse BID  . aspirin EC  81 mg Oral QPM  . atorvastatin  80 mg Oral q1800  . calcitRIOL  0.5 mcg Oral Daily  . clopidogrel  75 mg Oral Daily  . cyanocobalamin  1,000 mcg Subcutaneous Daily  . [START ON 11/30/2015] darbepoetin (ARANESP) injection - DIALYSIS  150 mcg Intravenous Q Wed-HD  . feeding supplement  1 Container Oral TID BM  . insulin aspart  0-15 Units Subcutaneous TID WC  . insulin glargine  13 Units Subcutaneous QHS  . isosorbide mononitrate  60 mg Oral Daily  . lanthanum  1,000 mg Oral TID WC  . metoprolol succinate  25 mg Oral QHS  . multivitamin  1 tablet Oral QHS  . pantoprazole  40 mg Oral Q1200  . QUEtiapine  12.5 mg Oral QHS    Time spent on care of this patient: 40 mins   WOODS, Roselind Messier , MD  [facility] Office  [phone] Pager [phone]  On-Call/Text Page:      [email or web address]      password [email or web address]  If 7PM-7AM, please contact night-coverage [email or web address] Password [facility] 11/24/2015, 6:29 PM   LOS: 8 days   Care during the described time interval was provided by me .  I have reviewed this patient's available data, including medical history, events of note, physical examination, and all test results as part of my evaluation. I have personally reviewed and interpreted all radiology  studies.   Carolyne Littles, MD [phone] Pager

## 2015-11-24 NOTE — Care Management Important Message (Signed)
Important Message  Patient Details  Name: MONTEZ CUDA MRN: [id number] Date of Birth: [birth year]   Medicare Important Message Given:  Yes    Cheyann Blecha Abena 11/24/2015, 1:40 PM

## 2015-11-25 DIAGNOSIS — E876 Hypokalemia: Secondary | ICD-10-CM

## 2015-11-25 DIAGNOSIS — N189 Chronic kidney disease, unspecified: Secondary | ICD-10-CM

## 2015-11-25 DIAGNOSIS — I5031 Acute diastolic (congestive) heart failure: Secondary | ICD-10-CM

## 2015-11-25 LAB — CBC
HCT: 28.7 % — ABNORMAL LOW (ref 39.0–52.0)
Hemoglobin: 9.6 g/dL — ABNORMAL LOW (ref 13.0–17.0)
MCH: 29.5 pg (ref 26.0–34.0)
MCHC: 33.4 g/dL (ref 30.0–36.0)
MCV: 88.3 fL (ref 78.0–100.0)
PLATELETS: 205 10*3/uL (ref 150–400)
RBC: 3.25 MIL/uL — ABNORMAL LOW (ref 4.22–5.81)
RDW: 16.5 % — AB (ref 11.5–15.5)
WBC: 12.7 10*3/uL — ABNORMAL HIGH (ref 4.0–10.5)

## 2015-11-25 LAB — GLUCOSE, CAPILLARY
GLUCOSE-CAPILLARY: 190 mg/dL — AB (ref 65–99)
GLUCOSE-CAPILLARY: 149 mg/dL — AB (ref 65–99)
GLUCOSE-CAPILLARY: 119 mg/dL — AB (ref 65–99)
GLUCOSE-CAPILLARY: 193 mg/dL — AB (ref 65–99)

## 2015-11-25 LAB — RENAL FUNCTION PANEL
ANION GAP: 14 (ref 5–15)
Albumin: 2.2 g/dL — ABNORMAL LOW (ref 3.5–5.0)
BUN: 84 mg/dL — ABNORMAL HIGH (ref 6–20)
CO2: 22 mmol/L (ref 22–32)
CREATININE: 3.88 mg/dL — AB (ref 0.61–1.24)
Calcium: 8.3 mg/dL — ABNORMAL LOW (ref 8.9–10.3)
Chloride: 102 mmol/L (ref 101–111)
GFR calc Af Amer: 15 mL/min — ABNORMAL LOW (ref >60)
GFR calc non Af Amer: 13 mL/min — ABNORMAL LOW (ref >60)
GLUCOSE: 145 mg/dL — AB (ref 65–99)
Phosphorus: 6.8 mg/dL — ABNORMAL HIGH (ref 2.5–4.6)
Potassium: 3.1 mmol/L — ABNORMAL LOW (ref 3.5–5.1)
SODIUM: 138 mmol/L (ref 135–145)

## 2015-11-25 MED ORDER — POTASSIUM CHLORIDE CRYS ER 20 MEQ PO TBCR
20.0000 meq | EXTENDED_RELEASE_TABLET | Freq: Once | ORAL | Status: AC
  Administered 2015-11-25: 20 meq via ORAL
  Filled 2015-11-25: qty 1

## 2015-11-25 MED ORDER — SENNA 8.6 MG PO TABS
2.0000 | ORAL_TABLET | Freq: Every day | ORAL | Status: DC
  Administered 2015-11-25 – 2015-11-26 (×2): 17.2 mg via ORAL
  Filled 2015-11-25 (×2): qty 2

## 2015-11-25 NOTE — Progress Notes (Signed)
Patient refused bed alarm to be turned on. Patient advised to call first for assist before getting up. Call bell at bedside. Family at bedside. Hans Rusher, Drinda Butts, Charity fundraiser

## 2015-11-25 NOTE — Progress Notes (Signed)
PROGRESS NOTE    [name] ZOX:[id number] DOB: 1930/06/20 DOA: 11/16/2015 PCP: Lupita Raider, MD  HPI/Brief narrative 82 year old male with history of stage IV-5 chronic kidney disease, not on dialysis PTA, chronic anemia, PAD status post left iliac artery stenting following which he developed acute renal failure leading to chronic kidney disease in January 2016, chronic leg edema, uncontrolled DM, HTN, COPD, pernicious anemia, presented to ED on 11/16/15 with chest pain. Code STEMI was activated in the ED. Dr. Jacinto Halim, Cardiology emergently took him to the cath lab. Coronary angiography revealed no significant high-grade lesion and LVEF 45%. Cardiology suspected his presentation was related to demand ischemia from severe anemia and metabolic issues.   Assessment/Plan:   1. Acute coronary syndrome/NSTEMI: Presented with chest pain, markedly elevated serum troponin and markedly abnormal EKG suggestive of multivessel disease or left main disease. He also had history of non-STEMI in 08/2015. Emergently taken to cath lab on 1/18. Coronary angiography revealed no significant high-grade lesions. LVEF appeared to be about 45%. Cardiology suspects his chest pain on admission was related to demand ischemia & small vessel disease from severe anemia, metabolic issues and underlying medical comorbidities. Peak troponin 18. Continue aspirin, Plavix, Imdur, metoprolol and statins. 2. Acute diastolic CHF: Noted after cath. Likely precipitated by volume overload/blood transfusion. He was treated with BiPAP in the ICU and placed on nitroglycerin drip. Did not respond adequately to diuresis. Volume then managed across HD. Improved and stable. 3. Acute respiratory failure with hypoxemia: Due to acute pulmonary edema. Required BiPAP in ICU. 4. Anemia: Status post 4 units PRBC transfusion since admission. Iron okay. On Aranesp. 5. Acute on chronic kidney disease 5: Nephrology was consulted. Acute kidney injury from  contrast. Due to worsening renal functions and refractory volume overload, HD started. He received 3 days of dialysis (1/20-1/23). On 1/24, patient indicated to nephrology that he did not want to continue with hemodialysis and changed his decision to wanting HD on 1/25. He underwent tunneled HD catheter placement by [facility] on 1/25. AV fistula placement on hold due to recent NSTEMI. He will need fistula placement in the near future. Renal functions have started to gradually improve despite holding off on HD since 1/23. If continues to improve, may be discharged without hemodialysis over the next 48 hours. 6. Essential hypertension: Controlled. Continue amlodipine, metoprolol and Imdur. 7. Hypokalemia: Replace and follow. 8. Type II DM with renal complications: Holding Januvia. SSI. Reasonable inpatient control. 9. Secondary hyperparathyroidism: 10. PAD: 11. Hyperlipidemia: Statins to continue 12. History of non-tophaceous gout: Continue allopurinol. 13. History of tobacco abuse 14. Dementia/delirium with visual hallucinations: Resolved 15. Constipation: Started senna  DVT prophylaxis: SCDs Code Status: Full Family Communication: None at bedside Disposition Plan: DC home possibly in the next 48 hours.   Consultants:  Cardiology  Nephrology  CCM  Vascular surgery  Interventional radiology  Procedures:  Coronary angiography 11/16/15  Right IJ non-tunneled HD catheter by IR on 1/20  Tunneled right IJ catheter on 1/25  2-D echo 11/16/15: Study Conclusions  - Left ventricle: The cavity size was normal. There was mild concentric hypertrophy. Systolic function was at the lower limits of normal. The estimated ejection fraction was in the range of 50% to 55%. Mild diffuse hypokinesis. Features are consistent with a pseudonormal left ventricular filling pattern, with concomitant abnormal relaxation and increased filling pressure (grade 2 diastolic dysfunction). Doppler  parameters are consistent with both elevated ventricular end-diastolic filling pressure and elevated left atrial filling pressure. - Mitral valve: Calcified annulus. -  Atrial septum: No defect or patent foramen ovale was identified. - Pulmonary arteries: PA peak pressure: 75 mm Hg (S). - Pericardium, extracardiac: A small pericardial effusion was identified. The fluid had no internal echoes.There was no evidence of hemodynamic compromise.  Impressions:  - The right ventricular systolic pressure was increased consistent with severe pulmonary hypertension.   Antimicrobials:  None  Subjective: Denies complaints. Denies dyspnea or chest pain. Constipation-no BM since last week. Asking for laxative.  Objective: Filed Vitals:   11/24/15 1800 11/24/15 2131 11/25/15 0349 11/25/15 1000  BP: 145/46 152/39 148/79 158/48  Pulse: 72 70 68 72  Temp:  99.5 F (37.5 C) 98.5 F (36.9 C) 97.8 F (36.6 C)  TempSrc:  Oral Oral Oral  Resp: Height:      Weight:  63.2 kg (139 lb 5.3 oz)    SpO2: 98% 96% 96% 96%    Intake/Output Summary (Last 24 hours) at 11/25/15 1423 Last data filed at 11/25/15 1300  Gross per 24 hour  Intake 1940.83 ml  Output   1700 ml  Net 240.83 ml   Filed Weights   11/23/15 0530 11/24/15 0504 11/24/15 2131  Weight: 64.9 kg (143 lb 1.3 oz) 62.8 kg (138 lb 7.2 oz) 63.2 kg (139 lb 5.3 oz)    Exam:  General exam: Pleasant elderly male seen ambulating comfortably in the room. Respiratory system: Clear. No increased work of breathing. Right IJ tunneled HD catheter intact. Cardiovascular system: S1 & S2 heard, RRR. No JVD, murmurs, gallops, clicks or pedal edema. Gastrointestinal system: Abdomen is nondistended, soft and nontender. Normal bowel sounds heard. Central nervous system: Alert and oriented. No focal neurological deficits. Extremities: Symmetric 5 x 5 power.   Data Reviewed: Basic Metabolic Panel:  Recent Labs Lab  11/21/15 0249 11/22/15 0408 11/23/15 0230 11/24/15 0237 11/25/15 0559  NA 139 139 138 141 138  K 3.9 3.7 3.6 3.3* 3.1*  CL 99* 102 101 103 102  CO2 GLUCOSE 115* 104* 91 133* 145*  BUN 78* 56* 69* 78* 84*  CREATININE 5.15* 4.53* 4.99* 4.68* 3.88*  CALCIUM 8.3* 8.4* 8.4* 8.0* 8.3*  PHOS 5.6* 4.3 5.3* 6.7* 6.8*   Liver Function Tests:  Recent Labs Lab 11/21/15 0249 11/22/15 0408 11/23/15 0230 11/24/15 0237 11/25/15 0559  ALBUMIN 2.2* 2.2* 2.1* 1.9* 2.2*   No results for input(s): LIPASE, AMYLASE in the last 168 hours.  Recent Labs Lab 11/21/15 0249  AMMONIA 27   CBC:  Recent Labs Lab 11/20/15 0224 11/21/15 0249 11/23/15 0221 11/24/15 0237 11/25/15 0559  WBC 13.1* 12.6* 13.0* 10.4 12.7*  HGB 9.1* 9.1* 9.6* 9.2* 9.6*  HCT 26.4* 26.7* 28.9* 28.6* 28.7*  MCV 88.9 89.0 88.1 89.4 88.3  PLT 207 222 197 185 205   Cardiac Enzymes: No results for input(s): CKTOTAL, CKMB, CKMBINDEX, TROPONINI in the last 168 hours. BNP (last 3 results) No results for input(s): PROBNP in the last 8760 hours. CBG:  Recent Labs Lab 11/24/15 1703 11/24/15 1728 11/24/15 2129 11/25/15 0741 11/25/15 1137  GLUCAP 163* 363* 89 190* 149*    Recent Results (from the past 240 hour(s))  MRSA PCR Screening     Status: None   Collection Time: 11/16/15  7:45 PM  Result Value Ref Range Status   MRSA by PCR NEGATIVE NEGATIVE Final    Comment:        The GeneXpert MRSA Assay (FDA approved for NASAL specimens only), is one component  of a comprehensive MRSA colonization surveillance program. It is not intended to diagnose MRSA infection nor to guide or monitor treatment for MRSA infections.          Studies: Dg Chest Port 1 View  11/23/2015  CLINICAL DATA:  Post Diatek catheter placement EXAM: PORTABLE CHEST 1 VIEW COMPARISON:  Portable exam 1515 hours compared to 11/18/2015 FINDINGS: New large bore dual-lumen RIGHT jugular central venous catheter with tip  projecting over RIGHT atrium. Enlargement of cardiac silhouette with pulmonary vascular congestion. Perihilar infiltrates likely pulmonary edema, improved. No pleural effusion or pneumothorax. Bones unremarkable. IMPRESSION: Improved pulmonary edema. No pneumothorax following jugular line placement. Electronically Signed   By: Ulyses Southward M.D.   On: 11/23/2015 15:24   Dg Fluoro Guide Cv Line-no Report  11/23/2015  CLINICAL DATA:  FLOURO GUIDE CV LINE Fluoroscopy was utilized by the requesting physician.  No radiographic interpretation.        Scheduled Meds: . allopurinol  300 mg Oral Daily  . amLODipine  5 mg Oral Daily  . antiseptic oral rinse  7 mL Mouth Rinse BID  . aspirin EC  81 mg Oral QPM  . atorvastatin  80 mg Oral q1800  . calcitRIOL  0.5 mcg Oral Daily  . clopidogrel  75 mg Oral Daily  . cyanocobalamin  1,000 mcg Subcutaneous Daily  . [START ON 11/30/2015] darbepoetin (ARANESP) injection - DIALYSIS  150 mcg Intravenous Q Wed-HD  . feeding supplement  1 Container Oral TID BM  . insulin aspart  0-15 Units Subcutaneous TID WC  . insulin glargine  13 Units Subcutaneous QHS  . isosorbide mononitrate  60 mg Oral Daily  . lanthanum  1,000 mg Oral TID WC  . metoprolol succinate  25 mg Oral QHS  . multivitamin  1 tablet Oral QHS  . pantoprazole  40 mg Oral Q1200  . QUEtiapine  12.5 mg Oral QHS  . senna  2 tablet Oral Daily   Continuous Infusions:    Active Problems:   NSTEMI (non-ST elevated myocardial infarction) (HCC)   Elevated troponin   ESRD (end stage renal disease) (HCC)   Anemia of renal disease   Essential hypertension   HLD (hyperlipidemia)   Gout of elbow due to renal impairment   Hyperphosphatemia   Controlled type 2 diabetes mellitus with diabetic nephropathy (HCC)   Lewy body dementia without behavioral disturbance   ESRD on hemodialysis (HCC)   Acute pulmonary edema (HCC)   Chronic diastolic CHF (congestive heart failure) (HCC)   Anemia of chronic  disease   Dementia    Time spent: 30 minutes.    Marcellus Scott, MD, FACP, FHM. [facility] Pager [phone]  If 7PM-7AM, please contact night-coverage [email or web address] Password [email or web address] 11/25/2015, 2:23 PM    LOS: 9 days

## 2015-11-25 NOTE — Progress Notes (Signed)
Patient ambulated from room to linen closet on right side of hallway. Tolerated ambulation without difficulty, but did state that his legs felt very weak.  Patient's daughter at bedside; she stated she would like patient to receive PT rehab for a week or two to get his strength increased because she is not home during much of the day. I informed her that he would likely not meet criteria for SNF, but that I would place a CSW consult. I also obtained a PT eval from Dr. Waymon Amato. Patient's daughter also stated they did not have any hospital equipment at home and would like this to be addressed. I placed a CM consult for equipment needs.   Leanna Battles, RN.

## 2015-11-25 NOTE — Progress Notes (Signed)
Patient ID: [name], male   DOB: 02/12/1930, 82 y.o.   MRN: [id number] S:feels great O:BP 148/79 mmHg  Pulse 68  Temp(Src) 98.5 F (36.9 C) (Oral)  Resp 19  Ht  (1.676 m)  Wt 63.2 kg (139 lb 5.3 oz)  BMI 22.50 kg/m2  SpO2 96%  Intake/Output Summary (Last 24 hours) at 11/25/15 0810 Last data filed at 11/25/15 0616  Gross per 24 hour  Intake 1262.5 ml  Output   1250 ml  Net   12.5 ml   Intake/Output: I/O last 3 completed shifts: In: 1262.5 [P.O.:180; I.V.:1082.5] Out: 1800 [Urine:1800]  Intake/Output this shift:    Weight change: 0.4 kg (14.1 oz) Gen:WD WN WM in NAd CVS:no rub Resp:occ rhonchi NWG:NFAOZH Ext: trace pretib edema   Recent Labs Lab 11/19/15 0855 11/20/15 0224 11/21/15 0249 11/22/15 0408 11/23/15 0230 11/24/15 0237 11/25/15 0559  NA 139 141 139 139 138 141 138  K 4.4 4.4 3.9 3.7 3.6 3.3* 3.1*  CL 97* 103 99* 102 101 103 102  CO2 GLUCOSE 95 71 115* 104* 91 133* 145*  BUN 83* 54* 78* 56* 69* 78* 84*  CREATININE 4.22* 3.98* 5.15* 4.53* 4.99* 4.68* 3.88*  ALBUMIN 2.4* 2.3* 2.2* 2.2* 2.1* 1.9* 2.2*  CALCIUM 7.6* 8.1* 8.3* 8.4* 8.4* 8.0* 8.3*  PHOS 6.9* 5.8* 5.6* 4.3 5.3* 6.7* 6.8*   Liver Function Tests:  Recent Labs Lab 11/23/15 0230 11/24/15 0237 11/25/15 0559  ALBUMIN 2.1* 1.9* 2.2*   No results for input(s): LIPASE, AMYLASE in the last 168 hours.  Recent Labs Lab 11/21/15 0249  AMMONIA 27   CBC:  Recent Labs Lab 11/20/15 0224 11/21/15 0249 11/23/15 0221 11/24/15 0237 11/25/15 0559  WBC 13.1* 12.6* 13.0* 10.4 12.7*  HGB 9.1* 9.1* 9.6* 9.2* 9.6*  HCT 26.4* 26.7* 28.9* 28.6* 28.7*  MCV 88.9 89.0 88.1 89.4 88.3  PLT 207 222 197 185 205   Cardiac Enzymes: No results for input(s): CKTOTAL, CKMB, CKMBINDEX, TROPONINI in the last 168 hours. CBG:  Recent Labs Lab 11/24/15 1320 11/24/15 1703 11/24/15 1728 11/24/15 2129 11/25/15 0741  GLUCAP 83 163* 363* 89 190*    Iron Studies: No results  for input(s): IRON, TIBC, TRANSFERRIN, FERRITIN in the last 72 hours. Studies/Results: Dg Chest Port 1 View  11/23/2015  CLINICAL DATA:  Post Diatek catheter placement EXAM: PORTABLE CHEST 1 VIEW COMPARISON:  Portable exam 1515 hours compared to 11/18/2015 FINDINGS: New large bore dual-lumen RIGHT jugular central venous catheter with tip projecting over RIGHT atrium. Enlargement of cardiac silhouette with pulmonary vascular congestion. Perihilar infiltrates likely pulmonary edema, improved. No pleural effusion or pneumothorax. Bones unremarkable. IMPRESSION: Improved pulmonary edema. No pneumothorax following jugular line placement. Electronically Signed   By: Ulyses Southward M.D.   On: 11/23/2015 15:24   Dg Fluoro Guide Cv Line-no Report  11/23/2015  CLINICAL DATA:  FLOURO GUIDE CV LINE Fluoroscopy was utilized by the requesting physician.  No radiographic interpretation.   Marland Kitchen allopurinol  300 mg Oral Daily  . amLODipine  5 mg Oral Daily  . antiseptic oral rinse  7 mL Mouth Rinse BID  . aspirin EC  81 mg Oral QPM  . atorvastatin  80 mg Oral q1800  . calcitRIOL  0.5 mcg Oral Daily  . clopidogrel  75 mg Oral Daily  . cyanocobalamin  1,000 mcg Subcutaneous Daily  . [START ON 11/30/2015] darbepoetin (ARANESP) injection - DIALYSIS  150 mcg Intravenous Q Wed-HD  .  feeding supplement  1 Container Oral TID BM  . insulin aspart  0-15 Units Subcutaneous TID WC  . insulin glargine  13 Units Subcutaneous QHS  . isosorbide mononitrate  60 mg Oral Daily  . lanthanum  1,000 mg Oral TID WC  . metoprolol succinate  25 mg Oral QHS  . multivitamin  1 tablet Oral QHS  . pantoprazole  40 mg Oral Q1200  . QUEtiapine  12.5 mg Oral QHS    BMET    Component Value Date/Time   NA 138 11/25/2015 0559   NA 142 09/28/2015   K 3.1* 11/25/2015 0559   CL 102 11/25/2015 0559   CO2 22 11/25/2015 0559   GLUCOSE 145* 11/25/2015 0559   BUN 84* 11/25/2015 0559   BUN 102* 09/28/2015   CREATININE 3.88* 11/25/2015 0559    CREATININE 2.8* 09/28/2015   CALCIUM 8.3* 11/25/2015 0559   GFRNONAA 13* 11/25/2015 0559   GFRAA 15* 11/25/2015 0559   CBC    Component Value Date/Time   WBC 12.7* 11/25/2015 0559   WBC 7.4 09/28/2015   WBC 7.4 09/28/2015   RBC 3.25* 11/25/2015 0559   HGB 9.6* 11/25/2015 0559   HCT 28.7* 11/25/2015 0559   HCT 22.0* 11/16/2015 2043   PLT 205 11/25/2015 0559   MCV 88.3 11/25/2015 0559   MCH 29.5 11/25/2015 0559   MCHC 33.4 11/25/2015 0559   RDW 16.5* 11/25/2015 0559   LYMPHSABS 1.6 09/18/2015 0420   MONOABS 0.8 09/18/2015 0420   EOSABS 0.3 09/18/2015 0420   BASOSABS 0.0 09/18/2015 0420     Assessment/Plan:  1. AKI/CKD stage 5- in setting of IV contrast and ABLA. Is much more awake and alert today and is now reversing his decision from 11/22/15 to stop HD. He wants to sit in a recliner and see how he does (he could not tolerate lying in bed).  1. Will hold off on HD for now but any future HD will be in a recliner and see how he does. If he does not think he could tolerate it 3 days a week would ask palliative care to come back and speak with him regarding moving forward with hospice.  2. He has had a similar presentation in the past and was able to recover enough kidney function that he did not require HD so it is possible he may improve and his Scr did improve over the last 24 hours, however he still has chronic CKD stage 5 at baseline.  3. Will hold offs on HD as it appears that his renal function is starting to improve 4. Stop IVF's and continue to follow UOP and Scr due to improvement of both (may be able to be discharged without hemodialysis). 2. Anemia- improving with esa 3. CAD- stable with negative cath. Elevated troponin likely due to demand ischemia related to severe anemia per cardiology. Unfortunately his AVF placement is on hold due to his recent elevated troponin/NSTEMI although cath was negative.   4. Vascular access- was for AVF yesterday but only received  a tunneled HD (11/23/15) due to concerns about recent elevated troponins, although cardiac cath was not unrevealing for significant CAD. Will require AVF in the near future. Appreciate [facility]'s patience. 1. If his scr continue to improve, may be able to dc permcath prior to discharge. 5. Protein malnutrition 6. CHF acute on chronic- resolved 7. AMS- resolved 8. Dispo- pending his need/toleration of HD in chair vs renal improvement  Julien Nordmann Hazen Brumett

## 2015-11-25 NOTE — Progress Notes (Signed)
Patient sitting on recliner,chair alarm in place.Advised to call before getting up. Patient verbalized understanding.Call bell within reach. [name], Drinda Butts, Charity fundraiser

## 2015-11-26 DIAGNOSIS — D631 Anemia in chronic kidney disease: Secondary | ICD-10-CM

## 2015-11-26 DIAGNOSIS — D638 Anemia in other chronic diseases classified elsewhere: Secondary | ICD-10-CM

## 2015-11-26 DIAGNOSIS — I10 Essential (primary) hypertension: Secondary | ICD-10-CM

## 2015-11-26 LAB — RENAL FUNCTION PANEL
ANION GAP: 14 (ref 5–15)
Albumin: 2.2 g/dL — ABNORMAL LOW (ref 3.5–5.0)
BUN: 93 mg/dL — ABNORMAL HIGH (ref 6–20)
CHLORIDE: 108 mmol/L (ref 101–111)
CO2: 19 mmol/L — AB (ref 22–32)
Calcium: 8.1 mg/dL — ABNORMAL LOW (ref 8.9–10.3)
Creatinine, Ser: 3.35 mg/dL — ABNORMAL HIGH (ref 0.61–1.24)
GFR calc non Af Amer: 16 mL/min — ABNORMAL LOW (ref >60)
GFR, EST AFRICAN AMERICAN: 18 mL/min — AB (ref >60)
Glucose, Bld: 104 mg/dL — ABNORMAL HIGH (ref 65–99)
POTASSIUM: 3.1 mmol/L — AB (ref 3.5–5.1)
Phosphorus: 6.7 mg/dL — ABNORMAL HIGH (ref 2.5–4.6)
Sodium: 141 mmol/L (ref 135–145)

## 2015-11-26 LAB — CBC
HCT: 27.1 % — ABNORMAL LOW (ref 39.0–52.0)
Hemoglobin: 8.8 g/dL — ABNORMAL LOW (ref 13.0–17.0)
MCH: 28.9 pg (ref 26.0–34.0)
MCHC: 32.5 g/dL (ref 30.0–36.0)
MCV: 89.1 fL (ref 78.0–100.0)
Platelets: 186 10*3/uL (ref 150–400)
RBC: 3.04 MIL/uL — AB (ref 4.22–5.81)
RDW: 16.6 % — ABNORMAL HIGH (ref 11.5–15.5)
WBC: 10.7 10*3/uL — ABNORMAL HIGH (ref 4.0–10.5)

## 2015-11-26 LAB — GLUCOSE, CAPILLARY
GLUCOSE-CAPILLARY: 138 mg/dL — AB (ref 65–99)
GLUCOSE-CAPILLARY: 189 mg/dL — AB (ref 65–99)

## 2015-11-26 MED ORDER — BISACODYL 10 MG RE SUPP
10.0000 mg | Freq: Once | RECTAL | Status: AC
  Administered 2015-11-26: 10 mg via RECTAL
  Filled 2015-11-26: qty 1

## 2015-11-26 MED ORDER — AMLODIPINE BESYLATE 10 MG PO TABS: 5.0000 mg | ORAL_TABLET | Freq: Every day | ORAL | Status: AC

## 2015-11-26 MED ORDER — POTASSIUM CHLORIDE CRYS ER 20 MEQ PO TBCR
40.0000 meq | EXTENDED_RELEASE_TABLET | Freq: Once | ORAL | Status: AC
  Administered 2015-11-26: 40 meq via ORAL
  Filled 2015-11-26: qty 2

## 2015-11-26 MED ORDER — LANTHANUM CARBONATE 1000 MG PO CHEW: 1000.0000 mg | CHEWABLE_TABLET | Freq: Three times a day (TID) | ORAL | Status: AC

## 2015-11-26 MED ORDER — CALCITRIOL 0.25 MCG PO CAPS: 0.5000 ug | ORAL_CAPSULE | Freq: Every day | ORAL | Status: AC

## 2015-11-26 NOTE — Discharge Summary (Signed)
Physician Discharge Summary  [name] ZOX:[id number] DOB: 07/19/1930 DOA: 11/16/2015  PCP: Lupita Raider, MD  Admit date: 11/16/2015 Discharge date: 11/26/2015  Time spent: Greater than 30 minutes  Recommendations for Outpatient Follow-up:  1. Dr. Lupita Raider, PCP in 1 week 2. Dr. Yates Decamp, Cardiology in 1 week 3. Dr. Beryle Lathe, Nephrology: MDs office will arrange follow-up appointment with repeat labs in 1 week.  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  Filed Weights   11/24/15 0504 11/24/15 2131 11/25/15 2019  Weight: 62.8 kg (138 lb 7.2 oz) 63.2 kg (139 lb 5.3 oz) 62.159 kg (137 lb 0.6 oz)    History of present illness:  82 year old male with history of stage IV-5 chronic kidney disease, not on dialysis PTA, chronic anemia, PAD status post left iliac artery stenting following which he developed acute renal failure leading to chronic kidney disease in January 2016, chronic leg edema, uncontrolled DM, HTN, COPD, pernicious anemia, presented to ED on 11/16/15 with chest pain. Code STEMI was activated in the ED. Dr. Jacinto Halim, Cardiology emergently took him to the cath lab. Coronary angiography revealed no significant high-grade lesion and LVEF 45%. Cardiology suspected his presentation was related to demand ischemia from severe anemia and metabolic  issues.  Hospital Course:   1. Acute coronary syndrome/NSTEMI: Presented with chest pain, markedly elevated serum troponin and markedly abnormal EKG suggestive of multivessel disease or left main disease. He also had history of non-STEMI in 08/2015. Emergently taken to cath lab on 1/18. Coronary angiography revealed no significant high-grade lesions. LVEF appeared to be about 45%. Cardiology suspects his chest pain on admission was related to demand ischemia & small vessel disease from severe anemia, metabolic issues and underlying medical comorbidities. Peak troponin 18. Continue aspirin, Plavix, Imdur, metoprolol and statins. Outpatient follow-up with cardiology. 2. Acute diastolic CHF: Noted after cath. Likely precipitated by volume overload/blood transfusion. He was treated with BiPAP in the ICU and placed on nitroglycerin drip. Did not respond adequately to diuresis. Volume then managed across HD. Improved and stable. Clinically euvolemic. No Lasix at discharge given recent acute renal failure and euvolemic status. 3. Acute respiratory failure with hypoxemia: Due to acute pulmonary edema. Required BiPAP in ICU. Resolved. 4. Anemia: Status post 4 units PRBC transfusion since admission. Iron okay. On Aranesp. Close outpatient follow-up with repeat labs in 1 week. 5. Acute on chronic kidney disease 5: Nephrology was consulted. Acute kidney injury from contrast. Due to worsening renal functions and refractory volume overload, HD started. He received 3 days of dialysis (1/20-1/23). On 1/24, patient indicated to nephrology that he did not want to continue with hemodialysis and changed his decision to wanting HD on 1/25. He underwent tunneled HD catheter placement by [facility] on 1/25. AV fistula placement on hold due to recent NSTEMI. He will need fistula placement in the near future. Renal functions have started  to gradually improve despite holding off on HD since 1/23. Discussed with nephrology/Dr.Coladonato who  indicated that since patient's renal functions continued to improve and good urine output, discontinue hemodialysis catheter and cleared for discharge home. He will arrange outpatient follow-up with primary nephrologist. 6. Essential hypertension: Controlled. Continue amlodipine at reduced dose, metoprolol and Imdur. 7. Hypokalemia: Replaced prior to discharge. Outpatient follow-up with repeat labs. 8. Type II DM with renal complications: patients oral hypoglycemics were held in the hospital and he was treated with Lantus and SSI. Discussed with pharmacy and will resume Januvia which is already adjusted to his renal functions, at discharge.  9. Secondary hyperparathyroidism: 10. PAD: 11. Hyperlipidemia: Statins to continue 12. History of non-tophaceous gout: Continue allopurinol. 13. History of tobacco abuse 14. Dementia/delirium with visual hallucinations: Resolved 15. Constipation: patient had small BM this morning.     Consultants:  Cardiology  Nephrology  CCM  Vascular surgery  Interventional radiology  Procedures:  Coronary angiography 11/16/15  Right IJ non-tunneled HD catheter by IR on 1/20-removed   Tunneled right IJ catheter on 1/25-removed 1/28   2-D echo 11/16/15: Study Conclusions  - Left ventricle: The cavity size was normal. There was mild concentric hypertrophy. Systolic function was at the lower limits of normal. The estimated ejection fraction was in the range of 50% to 55%. Mild diffuse hypokinesis. Features are consistent with a pseudonormal left ventricular filling pattern, with concomitant abnormal relaxation and increased filling pressure (grade 2 diastolic dysfunction). Doppler parameters are consistent with both elevated ventricular end-diastolic filling pressure and elevated left atrial filling pressure. - Mitral valve: Calcified annulus. - Atrial septum: No defect or patent foramen ovale was identified. - Pulmonary arteries: PA  peak pressure: 75 mm Hg (S). - Pericardium, extracardiac: A small pericardial effusion was identified. The fluid had no internal echoes.There was no evidence of hemodynamic compromise.  Impressions:  - The right ventricular systolic pressure was increased consistent with severe pulmonary hypertension.   Discharge Exam:  Complaints:  denies complaints. Had small BM this morning.   Filed Vitals:   11/25/15 1755 11/25/15 2019 11/26/15 0456 11/26/15 0900  BP: 155/52 162/53 149/54 169/50  Pulse: 68 74 68 66  Temp: 98.2 F (36.8 C) 98.2 F (36.8 C) 98.2 F (36.8 C) 98 F (36.7 C)  TempSrc: Oral Oral Oral Oral  Resp:  16 16 18   Height:      Weight:  62.159 kg (137 lb 0.6 oz)    SpO2: 98% 98% 97% 98%    General exam: Pleasant elderly male seen ambulating comfortably in the room. Respiratory system: Clear. No increased work of breathing.  Cardiovascular system: S1 & S2 heard, RRR. No JVD, murmurs, gallops, clicks or pedal edema. Gastrointestinal system: Abdomen is nondistended, soft and nontender. Normal bowel sounds heard. Central nervous system: Alert and oriented. No focal neurological deficits. Extremities: Symmetric 5 x 5 power.  Discharge Instructions      Discharge Instructions    (HEART FAILURE PATIENTS) Call MD:  Anytime you have any of the following symptoms: 1) 3 pound weight gain in 24 hours or 5 pounds in 1 week 2) shortness of breath, with or without a dry hacking cough 3) swelling in the hands, feet or stomach 4) if you have to sleep on extra pillows at night in order to breathe.    Complete by:  As directed      Call MD for:  difficulty breathing, headache or visual disturbances    Complete by:  As directed  Call MD for:  extreme fatigue    Complete by:  As directed      Call MD for:  persistant dizziness or light-headedness    Complete by:  As directed      Call MD for:  persistant nausea and vomiting    Complete by:  As directed      Call MD  for:  redness, tenderness, or signs of infection (pain, swelling, redness, odor or green/yellow discharge around incision site)    Complete by:  As directed      Call MD for:  severe uncontrolled pain    Complete by:  As directed      Call MD for:  temperature >100.4    Complete by:  As directed      Diet - low sodium heart healthy    Complete by:  As directed      Diet Carb Modified    Complete by:  As directed      Increase activity slowly    Complete by:  As directed             Medication List    STOP taking these medications        feeding supplement Liqd     furosemide 80 MG tablet  Commonly known as:  LASIX     hydrALAZINE 50 MG tablet  Commonly known as:  APRESOLINE      TAKE these medications        ACCU-CHEK FASTCLIX LANCETS Misc  Use to check blood sugar 2 times per day dx code E11.9     allopurinol 100 MG tablet  Commonly known as:  ZYLOPRIM  TAKE 1 TABLET (100 MG TOTAL) BY MOUTH AT BEDTIME.     amLODipine 10 MG tablet  Commonly known as:  NORVASC  Take 0.5 tablets (5 mg total) by mouth daily.     aspirin EC 81 MG tablet  Take 81 mg by mouth every evening.     atorvastatin 80 MG tablet  Commonly known as:  LIPITOR  Take 1 tablet (80 mg total) by mouth daily at 6 PM.     calcitRIOL 0.25 MCG capsule  Commonly known as:  ROCALTROL  Take 2 capsules (0.5 mcg total) by mouth daily.     clopidogrel 75 MG tablet  Commonly known as:  PLAVIX  TAKE 1 TABLET BY MOUTH DAILY     ferrous sulfate 325 (65 FE) MG tablet  Take 325 mg by mouth 2 (two) times daily with a meal.     glucose blood test strip  Commonly known as:  ACCU-CHEK AVIVA PLUS  Use as instructed to check blood sugar 2 times per day dx code E11.9     isosorbide mononitrate 60 MG 24 hr tablet  Commonly known as:  IMDUR  Take 1 tablet (60 mg total) by mouth daily.     lanthanum 1000 MG chewable tablet  Commonly known as:  FOSRENOL  Chew 1 tablet (1,000 mg total) by mouth 3 (three) times  daily with meals.     metoprolol succinate 50 MG 24 hr tablet  Commonly known as:  TOPROL-XL  Take 1 tablet (50 mg total) by mouth daily. Take with or immediately following a meal.     pantoprazole 40 MG tablet  Commonly known as:  PROTONIX  Take 1 tablet (40 mg total) by mouth daily.     polyethylene glycol packet  Commonly known as:  MIRALAX / GLYCOLAX  Take 17 g by  mouth daily as needed for mild constipation.     PRESERVISION AREDS PO  Take 2 tablets by mouth daily.     sitaGLIPtin 25 MG tablet  Commonly known as:  JANUVIA  Take 1 tablet (25 mg total) by mouth daily.       Follow-up Information    Follow up with SHAW,[name], MD. Schedule an appointment as soon as possible for a visit in 1 week.   Specialty:  Family Medicine   Contact information:   [address] [place] [address] [phone]       Follow up with Yates Decamp, MD. Schedule an appointment as soon as possible for a visit in 1 week.   Specialty:  Cardiology   Contact information:   [address] [place] [address] [phone]       Follow up with DETERDING,JAMES L, MD.   Specialty:  Nephrology   Why:  MDs office will arrange follow-up appointment for repeat labs and M.D. follow-up. Please call office if you don't hear back within the next 2-3 days.   Contact information:   [address] [place] [address] [phone]        The results of significant diagnostics from this hospitalization (including imaging, microbiology, ancillary and laboratory) are listed below for reference.    Significant Diagnostic Studies: Dg Chest 1 View  11/16/2015  CLINICAL DATA:  Hypoxia and shortness of breath. Status post cardiac catheterization. EXAM: CHEST 1 VIEW COMPARISON:  Film earlier today at 1034 hours FINDINGS: Similar pattern of congestive heart failure with bilateral edema. No significant pleural fluid identified. The heart size is stable and within normal limits. No  pneumothorax. IMPRESSION: Similar appearance of CHF compared to the prior study earlier today Electronically Signed   By: Irish Lack M.D.   On: 11/16/2015 18:10   Ir Fluoro Guide Cv Line Right  11/18/2015  CLINICAL DATA:  Acute renal failure and need for temporary non tunneled hemodialysis catheter. EXAM: NON-TUNNELED CENTRAL VENOUS CATHETER PLACEMENT WITH ULTRASOUND AND FLUOROSCOPIC GUIDANCE FLUOROSCOPY TIME:  6 seconds. PROCEDURE: The procedure, risks, benefits, and alternatives were explained to the patient. Questions regarding the procedure were encouraged and answered. The patient understands and consents to the procedure. Ultrasound was utilized to confirm patency of the right internal jugular vein. The right neck and chest were prepped with chlorhexidine in a sterile fashion, and a sterile drape was applied covering the operative field. Maximum barrier sterile technique with sterile gowns and gloves were used for the procedure. Local anesthesia was provided with 1% lidocaine. After creating a small venotomy incision, a 21 gauge needle was advanced into the right internal jugular vein under direct, real-time ultrasound guidance. Ultrasound image documentation was performed. After securing guidewire access the venotomy was dilated. A 20 cm length, 13 French Trialysis catheter was advanced Final catheter positioning was confirmed and documented with a fluoroscopic spot image. The catheter was aspirated, flushed with saline, and injected with appropriate volume heparin dwells. The catheter exit site was secured with 0-Prolene retention sutures. COMPLICATIONS: None.  No pneumothorax. FINDINGS: After catheter placement, the tip lies at the cavoatrial junction. The catheter aspirates normally and is ready for immediate use. IMPRESSION: Placement of non-tunneled central venous Trialysis catheter via the right internal jugular vein. The catheter tip lies at the cavoatrial junction. The catheter is ready for  immediate use. Electronically Signed   By: Irish Lack M.D.   On: 11/18/2015 17:31   Ir US Guide Vasc  Access Right  11/18/2015  CLINICAL DATA:  Acute renal failure and need for temporary non tunneled hemodialysis catheter. EXAM: NON-TUNNELED CENTRAL VENOUS CATHETER PLACEMENT WITH ULTRASOUND AND FLUOROSCOPIC GUIDANCE FLUOROSCOPY TIME:  6 seconds. PROCEDURE: The procedure, risks, benefits, and alternatives were explained to the patient. Questions regarding the procedure were encouraged and answered. The patient understands and consents to the procedure. Ultrasound was utilized to confirm patency of the right internal jugular vein. The right neck and chest were prepped with chlorhexidine in a sterile fashion, and a sterile drape was applied covering the operative field. Maximum barrier sterile technique with sterile gowns and gloves were used for the procedure. Local anesthesia was provided with 1% lidocaine. After creating a small venotomy incision, a 21 gauge needle was advanced into the right internal jugular vein under direct, real-time ultrasound guidance. Ultrasound image documentation was performed. After securing guidewire access the venotomy was dilated. A 20 cm length, 13 French Trialysis catheter was advanced Final catheter positioning was confirmed and documented with a fluoroscopic spot image. The catheter was aspirated, flushed with saline, and injected with appropriate volume heparin dwells. The catheter exit site was secured with 0-Prolene retention sutures. COMPLICATIONS: None.  No pneumothorax. FINDINGS: After catheter placement, the tip lies at the cavoatrial junction. The catheter aspirates normally and is ready for immediate use. IMPRESSION: Placement of non-tunneled central venous Trialysis catheter via the right internal jugular vein. The catheter tip lies at the cavoatrial junction. The catheter is ready for immediate use. Electronically Signed   By: Irish Lack M.D.   On: 11/18/2015  17:31   Dg Chest Port 1 View  11/23/2015  CLINICAL DATA:  Post Diatek catheter placement EXAM: PORTABLE CHEST 1 VIEW COMPARISON:  Portable exam 1515 hours compared to 11/18/2015 FINDINGS: New large bore dual-lumen RIGHT jugular central venous catheter with tip projecting over RIGHT atrium. Enlargement of cardiac silhouette with pulmonary vascular congestion. Perihilar infiltrates likely pulmonary edema, improved. No pleural effusion or pneumothorax. Bones unremarkable. IMPRESSION: Improved pulmonary edema. No pneumothorax following jugular line placement. Electronically Signed   By: Ulyses Southward M.D.   On: 11/23/2015 15:24   Dg Chest Port 1 View  11/18/2015  CLINICAL DATA:  Dyspnea EXAM: PORTABLE CHEST 1 VIEW COMPARISON:  11/18/2015 FINDINGS: Progressive bilateral airspace disease right greater than left. No significant effusion. Mild cardiac enlargement. IMPRESSION: Progression of bilateral airspace disease. This may represent pulmonary edema or possibly pneumonia. Electronically Signed   By: Marlan Palau M.D.   On: 11/18/2015 07:19   Dg Chest Port 1 View  11/18/2015  CLINICAL DATA:  82 year old male with shortness of breath EXAM: PORTABLE CHEST 1 VIEW COMPARISON:  Radiograph dated 11/17/2015 FINDINGS: Single-view of the chest demonstrate interval increase in the right mid to lower lung field opacity. There is stable left lung airspace opacity. Small right pleural effusion with small amount of fluid noted in the right minor fissure. No pneumothorax. Stable cardiac silhouette. No acute osseous pathology. IMPRESSION: Interval worsening of the airspace opacity in the right lower lung field. Electronically Signed   By: Elgie Collard M.D.   On: 11/18/2015 00:36   Dg Chest Port 1 View  11/17/2015  CLINICAL DATA:  Shortness of breath. EXAM: PORTABLE CHEST 1 VIEW COMPARISON:  11/16/2015. FINDINGS: Cardiomegaly with persistent unchanged diffuse bilateral pulmonary infiltrates consistent with pulmonary edema.  No pleural effusion or pneumothorax. No acute bony abnormality. IMPRESSION: Cardiomegaly with persistent diffuse bilateral pulmonary infiltrates consistent with pulmonary edema. No interim change. Electronically Signed   By: Maisie Fus  Register   On: 11/17/2015 07:18   Dg Chest Portable 1 View  11/16/2015  CLINICAL DATA:  Shortness of breath and confusion EXAM: PORTABLE CHEST 1 VIEW COMPARISON:  September 21, 2015 FINDINGS: There is moderate generalized interstitial edema and patchy alveolar edema in the right base. Lungs elsewhere clear. Heart is upper normal in size with pulmonary vascular within normal limits. No adenopathy. No pneumothorax. No bone lesions. IMPRESSION: Evidence of a degree of congestive heart failure. Probable alveolar edema right lower lobe, although a small focus of superimposed pneumonia cannot be excluded radiographically. Electronically Signed   By: Bretta Bang III M.D.   On: 11/16/2015 10:43   Dg Fluoro Guide Cv Line-no Report  11/23/2015  CLINICAL DATA:  FLOURO GUIDE CV LINE Fluoroscopy was utilized by the requesting physician.  No radiographic interpretation.    Microbiology: Recent Results (from the past 240 hour(s))  MRSA PCR Screening     Status: None   Collection Time: 11/16/15  7:45 PM  Result Value Ref Range Status   MRSA by PCR NEGATIVE NEGATIVE Final    Comment:        The GeneXpert MRSA Assay (FDA approved for NASAL specimens only), is one component of a comprehensive MRSA colonization surveillance program. It is not intended to diagnose MRSA infection nor to guide or monitor treatment for MRSA infections.      Labs: Basic Metabolic Panel:  Recent Labs Lab 11/22/15 0408 11/23/15 0230 11/24/15 0237 11/25/15 0559 11/26/15 0433  NA 139 138 141 138 141  K 3.7 3.6 3.3* 3.1* 3.1*  CL 102 101 103 102 108  CO2 24 25 25 22  19*  GLUCOSE 104* 91 133* 145* 104*  BUN 56* 69* 78* 84* 93*  CREATININE 4.53* 4.99* 4.68* 3.88* 3.35*  CALCIUM 8.4*  8.4* 8.0* 8.3* 8.1*  PHOS 4.3 5.3* 6.7* 6.8* 6.7*   Liver Function Tests:  Recent Labs Lab 11/22/15 0408 11/23/15 0230 11/24/15 0237 11/25/15 0559 11/26/15 0433  ALBUMIN 2.2* 2.1* 1.9* 2.2* 2.2*   No results for input(s): LIPASE, AMYLASE in the last 168 hours.  Recent Labs Lab 11/21/15 0249  AMMONIA 27   CBC:  Recent Labs Lab 11/21/15 0249 11/23/15 0221 11/24/15 0237 11/25/15 0559 11/26/15 0433  WBC 12.6* 13.0* 10.4 12.7* 10.7*  HGB 9.1* 9.6* 9.2* 9.6* 8.8*  HCT 26.7* 28.9* 28.6* 28.7* 27.1*  MCV 89.0 88.1 89.4 88.3 89.1  PLT 222 197 185 205 186   Cardiac Enzymes: No results for input(s): CKTOTAL, CKMB, CKMBINDEX, TROPONINI in the last 168 hours. BNP: BNP (last 3 results)  Recent Labs  09/11/15 0955 11/16/15 1015  BNP 2200.4* 2120.5*    ProBNP (last 3 results) No results for input(s): PROBNP in the last 8760 hours.  CBG:  Recent Labs Lab 11/25/15 1137 11/25/15 1642 11/25/15 2108 11/26/15 0731 11/26/15 1201  GLUCAP 149* 119* 193* 138* 189*       Signed:  Marcellus Scott, MD, FACP, FHM. [facility] Pager [phone]  If 7PM-7AM, please contact night-coverage [email or web address] Password [email or web address] 11/26/2015, 1:22 PM

## 2015-11-26 NOTE — Progress Notes (Signed)
H&P    CC:  Catheter removal   HPI:  This is a 82 y.o. male who we were consulted on this admission.  The pt originally did not want to undergo dialysis, but subsequently changed his mind.  He had a TDC placed on 11/23/15 by Dr. Darrick Penna and was going to have permanent access at a later date due to MI.  His renal function is improving and he is no longer requiring HD.  It has been requested to remove his [facility] and the pt will be discharged home.   Past Medical History  Diagnosis Date  . Diabetes mellitus   . Hypertension   . Hypercholesteremia   . Gout   . PVD (peripheral vascular disease) (HCC) 7/05    LCE, known 60% RICA 7/12  . CAD (coronary artery disease) 2002    non critical  . Macular degeneration   . Chronic renal disease, stage III   . Claudication (HCC)   . Palpitations     PVCs and PSVT on Holter monitoring  . Bilateral carotid artery disease (HCC)     status post left carotid endarterectomy performed by Dr. Madilyn Fireman 05/12/04  . Anemia     FH:  Non-Contributory  Social History   Social History  . Marital Status: Widowed    Spouse Name: N/A  . Number of Children: N/A  . Years of Education: N/A   Occupational History  . Not on file.   Social History Main Topics  . Smoking status: Former Smoker -- 1.00 packs/day for 40 years  . Smokeless tobacco: Never Used     Comment: quit approx. 20 years ago.  . Alcohol Use: No  . Drug Use: Not on file  . Sexual Activity: No   Other Topics Concern  . Not on file   Social History Narrative    Allergies  Allergen Reactions  . Labetalol Swelling    angioedema    Current Facility-Administered Medications  Medication Dose Route Frequency Provider Last Rate Last Dose  . acetaminophen (TYLENOL) tablet 650 mg  650 mg Oral Q4H PRN Yates Decamp, MD      . albuterol (PROVENTIL) (2.5 MG/3ML) 0.083% nebulizer solution 2.5 mg  2.5 mg Nebulization Q2H PRN Lonia Blood, MD   2.5 mg at 11/19/15 0401  . allopurinol (ZYLOPRIM)  tablet 300 mg  300 mg Oral Daily Beryle Lathe, MD   300 mg at 11/26/15 0934  . amLODipine (NORVASC) tablet 5 mg  5 mg Oral Daily Drema Dallas, MD   5 mg at 11/26/15 0935  . antiseptic oral rinse (CPC / CETYLPYRIDINIUM CHLORIDE 0.05%) solution 7 mL  7 mL Mouth Rinse BID Lonia Blood, MD   7 mL at 11/26/15 0935  . aspirin EC tablet 81 mg  81 mg Oral QPM Yates Decamp, MD   81 mg at 11/25/15 1744  . atorvastatin (LIPITOR) tablet 80 mg  80 mg Oral q1800 Beryle Lathe, MD   80 mg at 11/25/15 1744  . calcitRIOL (ROCALTROL) capsule 0.5 mcg  0.5 mcg Oral Daily Beryle Lathe, MD   0.5 mcg at 11/26/15 0935  . clopidogrel (PLAVIX) tablet 75 mg  75 mg Oral Daily Yates Decamp, MD   75 mg at 11/26/15 0934  . cyanocobalamin ((VITAMIN B-12)) injection 1,000 mcg  1,000 mcg Subcutaneous Daily Lonia Blood, MD   1,000 mcg at 11/25/15 1746  . [START ON 11/30/2015] Darbepoetin Alfa (ARANESP) injection 150 mcg  150 mcg Intravenous Q Wed-HD Veatrice Bourbon  Bell, RPH      . feeding supplement (BOOST / RESOURCE BREEZE) liquid 1 Container  1 Container Oral TID BM Yates Decamp, MD   1 Container at 11/26/15 [phone]  . haloperidol lactate (HALDOL) injection 2 mg  2 mg Intravenous Q6H PRN Elson Areas, PA-C   2 mg at 11/20/15 0011  . insulin aspart (novoLOG) injection 0-15 Units  0-15 Units Subcutaneous TID WC Yates Decamp, MD   2 Units at 11/26/15 0816  . insulin glargine (LANTUS) injection 13 Units  13 Units Subcutaneous QHS Drema Dallas, MD   13 Units at 11/25/15 2125  . isosorbide mononitrate (IMDUR) 24 hr tablet 60 mg  60 mg Oral Daily Yates Decamp, MD   60 mg at 11/26/15 0935  . lanthanum (FOSRENOL) chewable tablet 1,000 mg  1,000 mg Oral TID WC Beryle Lathe, MD   1,000 mg at 11/26/15 0815  . metoprolol succinate (TOPROL-XL) 24 hr tablet 25 mg  25 mg Oral QHS Beryle Lathe, MD   25 mg at 11/25/15 2123  . multivitamin (RENA-VIT) tablet 1 tablet  1 tablet Oral QHS Beryle Lathe, MD   1 tablet at 11/25/15 2123  .  ondansetron (ZOFRAN) injection 4 mg  4 mg Intravenous Q6H PRN Yates Decamp, MD   4 mg at 11/17/15 2130  . oxyCODONE-acetaminophen (PERCOCET/ROXICET) 5-325 MG per tablet 1 tablet  1 tablet Oral Q4H PRN Sherren Kerns, MD      . pantoprazole (PROTONIX) EC tablet 40 mg  40 mg Oral Q1200 Lonia Blood, MD   40 mg at 11/25/15 1241  . polyethylene glycol (MIRALAX / GLYCOLAX) packet 17 g  17 g Oral Daily PRN Yates Decamp, MD   17 g at 11/22/15 1127  . QUEtiapine (SEROQUEL) tablet 12.5 mg  12.5 mg Oral QHS Lonia Blood, MD   12.5 mg at 11/25/15 2123  . senna (SENOKOT) tablet 17.2 mg  2 tablet Oral Daily Elease Etienne, MD   17.2 mg at 11/26/15 0934    ROS:  See HPI  PHYSICAL EXAM  Filed Vitals:   11/26/15 0456 11/26/15 0900  BP: 149/54 169/50  Pulse: 68 66  Temp: 98.2 F (36.8 C) 98 F (36.7 C)  Resp: 16 18    Gen:  Well developed well nourished HEENT:  normocephalic Neck:  Right IJ TDC Lungs:  Non-labored Abdomen:  Soft, NT Skin:  No obvious rashes Neuro:  In tact  Lab/X-ray:  Impression: This is a 82 y.o. male no longer needing HD and in need of dialysis catheter removal  Plan:  Removal of right diatek catheter  Doreatha Massed, PA-C [facility] [phone] 11/26/2015 11:23 AM

## 2015-11-26 NOTE — Progress Notes (Signed)
Patient ID: [name], male   DOB: 03/13/1930, 82 y.o.   MRN: [id number] S:Feels great O:BP 149/54 mmHg  Pulse 68  Temp(Src) 98.2 F (36.8 C) (Oral)  Resp 16  Ht  (1.676 m)  Wt 62.159 kg (137 lb 0.6 oz)  BMI 22.13 kg/m2  SpO2 97%  Intake/Output Summary (Last 24 hours) at 11/26/15 0908 Last data filed at 11/26/15 0229  Gross per 24 hour  Intake 1320.83 ml  Output   1375 ml  Net -54.17 ml   Intake/Output: I/O last 3 completed shifts: In: 2220.8 [P.O.:1380; I.V.:840.8] Out: 2425 [Urine:2425]  Intake/Output this shift:    Weight change: -1.041 kg (-2 lb 4.7 oz) Gen:WD elderly WM in NAd CVS:no rub Resp:cta YQM:VHQION Ext:1+ edema   Recent Labs Lab 11/20/15 0224 11/21/15 0249 11/22/15 0408 11/23/15 0230 11/24/15 0237 11/25/15 0559 11/26/15 0433  NA 141 139 139 138 141 138 141  K 4.4 3.9 3.7 3.6 3.3* 3.1* 3.1*  CL 103 99* 102 101 103 102 108  CO2 19*  GLUCOSE 71 115* 104* 91 133* 145* 104*  BUN 54* 78* 56* 69* 78* 84* 93*  CREATININE 3.98* 5.15* 4.53* 4.99* 4.68* 3.88* 3.35*  ALBUMIN 2.3* 2.2* 2.2* 2.1* 1.9* 2.2* 2.2*  CALCIUM 8.1* 8.3* 8.4* 8.4* 8.0* 8.3* 8.1*  PHOS 5.8* 5.6* 4.3 5.3* 6.7* 6.8* 6.7*   Liver Function Tests:  Recent Labs Lab 11/24/15 0237 11/25/15 0559 11/26/15 0433  ALBUMIN 1.9* 2.2* 2.2*   No results for input(s): LIPASE, AMYLASE in the last 168 hours.  Recent Labs Lab 11/21/15 0249  AMMONIA 27   CBC:  Recent Labs Lab 11/21/15 0249 11/23/15 0221 11/24/15 0237 11/25/15 0559 11/26/15 0433  WBC 12.6* 13.0* 10.4 12.7* 10.7*  HGB 9.1* 9.6* 9.2* 9.6* 8.8*  HCT 26.7* 28.9* 28.6* 28.7* 27.1*  MCV 89.0 88.1 89.4 88.3 89.1  PLT 222 197 185 205 186   Cardiac Enzymes: No results for input(s): CKTOTAL, CKMB, CKMBINDEX, TROPONINI in the last 168 hours. CBG:  Recent Labs Lab 11/25/15 0741 11/25/15 1137 11/25/15 1642 11/25/15 2108 11/26/15 0731  GLUCAP 190* 149* 119* 193* 138*    Iron Studies: No  results for input(s): IRON, TIBC, TRANSFERRIN, FERRITIN in the last 72 hours. Studies/Results: No results found. Marland Kitchen allopurinol  300 mg Oral Daily  . amLODipine  5 mg Oral Daily  . antiseptic oral rinse  7 mL Mouth Rinse BID  . aspirin EC  81 mg Oral QPM  . atorvastatin  80 mg Oral q1800  . calcitRIOL  0.5 mcg Oral Daily  . clopidogrel  75 mg Oral Daily  . cyanocobalamin  1,000 mcg Subcutaneous Daily  . [START ON 11/30/2015] darbepoetin (ARANESP) injection - DIALYSIS  150 mcg Intravenous Q Wed-HD  . feeding supplement  1 Container Oral TID BM  . insulin aspart  0-15 Units Subcutaneous TID WC  . insulin glargine  13 Units Subcutaneous QHS  . isosorbide mononitrate  60 mg Oral Daily  . lanthanum  1,000 mg Oral TID WC  . metoprolol succinate  25 mg Oral QHS  . multivitamin  1 tablet Oral QHS  . pantoprazole  40 mg Oral Q1200  . QUEtiapine  12.5 mg Oral QHS  . senna  2 tablet Oral Daily    BMET    Component Value Date/Time   NA 141 11/26/2015 0433   NA 142 09/28/2015   K 3.1* 11/26/2015 0433   CL 108 11/26/2015 6295  CO2 19* 11/26/2015 0433   GLUCOSE 104* 11/26/2015 0433   BUN 93* 11/26/2015 0433   BUN 102* 09/28/2015   CREATININE 3.35* 11/26/2015 0433   CREATININE 2.8* 09/28/2015   CALCIUM 8.1* 11/26/2015 0433   GFRNONAA 16* 11/26/2015 0433   GFRAA 18* 11/26/2015 0433   CBC    Component Value Date/Time   WBC 10.7* 11/26/2015 0433   WBC 7.4 09/28/2015   WBC 7.4 09/28/2015   RBC 3.04* 11/26/2015 0433   HGB 8.8* 11/26/2015 0433   HCT 27.1* 11/26/2015 0433   HCT 22.0* 11/16/2015 2043   PLT 186 11/26/2015 0433   MCV 89.1 11/26/2015 0433   MCH 28.9 11/26/2015 0433   MCHC 32.5 11/26/2015 0433   RDW 16.6* 11/26/2015 0433   LYMPHSABS 1.6 09/18/2015 0420   MONOABS 0.8 09/18/2015 0420   EOSABS 0.3 09/18/2015 0420   BASOSABS 0.0 09/18/2015 0420     Assessment/Plan:  1. AKI/CKD stage 5- in setting of IV contrast and ABLA. Is much more awake and alert today and is now  reversing his decision from 11/22/15 to stop HD. He wants to sit in a recliner and see how he does (he could not tolerate lying in bed).  1. Scr continues to improve and will continue to hold off on HD for now but any future HD will be in a recliner and see how he does. If he does not think he could tolerate it 3 days a week would ask palliative care to come back and speak with him regarding moving forward with hospice.  2. He has had a similar presentation in the past and appears to have recovered enough kidney function that he does not require HD at this time. 3. However he still has advanced chronic CKD stage 5 at baseline and will require an AVF placement in the near future per [facility] (on hold due to his NSTEMI upon admission).  4. Stopped IVF's 11/25/15 and continue to follow UOP and Scr due to improvement of both (may be able to be discharged without hemodialysis). 2. Anemia- improving with esa 3. CAD- stable with negative cath. Elevated troponin likely due to demand ischemia related to severe anemia per cardiology. Unfortunately his AVF placement is on hold due to his recent elevated troponin/NSTEMI although cath was negative.  4. Vascular access- was for AVF yesterday but only received a tunneled HD (11/23/15) due to concerns about recent elevated troponins, although cardiac cath was not unrevealing for significant CAD. Will require AVF in the near future. Appreciate [facility]'s patience. 1. Since his scr continue to improve, dc permcath prior to discharge today. 5. Protein malnutrition 6. CHF acute on chronic- resolved 7. AMS- resolved 8. Dispo- pending removal of HD catheter today then can follow up with Dr. Darrick Penna.  Will arrange for follow up labs next week as well as appointment.  [name] [name]

## 2015-11-26 NOTE — Progress Notes (Signed)
  Catheter Removal Procedure Note    Diagnosis: ESRD  Plan:  Remove right diatek catheter  Consent signed:  Yes.   Time out completed:  Yes.   Coumadin:  No. PT/INR (if applicable):   Other labs:  Procedure: 1.  Sterile prepping and draping over catheter area 2. 0 ml 2% lidocaine plain instilled at removal site. 3.  right catheter removed in its entirety with cuff in tact. 4.  Complications:  none 5. Tip of catheter sent for culture:  No.   Patient tolerated procedure well:  Yes.   Pressure held, no bleeding noted, dressing applied Instructions given to the pt regarding wound care and bleeding.    Doreatha Massed, PA-C 11/26/2015 11:40 AM

## 2015-11-26 NOTE — Discharge Instructions (Signed)
Non-ST Segment Elevation Heart Attack A heart attack (myocardial infarction) happens when some of the heart muscle is injured or dies because it does not get enough oxygen. A non-ST segment elevation heart attack is a type of heart attack. It happens when the body does not get enough oxygen because an artery carrying blood to the heart muscles (coronary artery) becomes partly or temporarily blocked. This type of heart attack is usually less severe than the type of heart attack in which a coronary artery becomes completely blocked. CAUSES The most common cause of this condition is a blocked coronary artery. A coronary artery can become blocked from a gradual buildup of cholesterol, fat, and plaque. A blood clot can form over the plaque and block blood flow. RISK FACTORS This condition is more likely to develop in:  Smokers.  Males.  Older adults.  Overweight and obese adults.  People with high blood pressure (hypertension), high cholesterol, or diabetes.  People with a family history of heart disease.  People who do not get enough exercise.  People who are under a lot of stress.  People who drink too much alcohol.  People who use illegal street drugs that increase the heart rate, such as cocaine and methamphetamines. SYMPTOMS Symptoms of this condition include:  Chest pain or a feeling of pressure in the chest. It may feel like something is crushing or squeezing the chest.  Discomfort in the upper back or in the area between the shoulder blades.  Upper back pain.  Tingling in the hands and arms.  Shortness of breath.  Heartburn or indigestion.  Sudden cold sweats.  Unexplained sweating.  Sudden lightheadedness.  Unexplained feelings of nervousness or anxiety.  Feeling of tiredness, or not feeling well. DIAGNOSIS This condition is diagnosed based on a person's signs and symptoms and a physical exam. You may also have tests done, including:  Blood tests.  A chest  X-ray.  An test to measure the electrical activity of the heart (electrocardiogram).  A test that uses sound waves to produce a picture of the heart (echocardiogram).  A test to look at the heart arteries (coronary angiogram). If you are still having chest pain after 12-24 hours, or if your health care providers think your heart is at risk, you may have a procedure called cardiac catheterization. In this procedure, a long, thin tube is inserted into an artery in your groin and moved up to the arteries in your heart. This procedure helps your health care provider figure out the source of the problem.  TREATMENT This condition may be treated with:  Bed rest in the hospital.  Medicines to relieve chest pain.  Medicines to protect the heart.  If you have a blockage, a procedure in which the artery is opened (angioplasty) and a stent is placed to keep the artery open. After initial treatment you may need to take medicine to:  Keep your blood from clotting too easily.  Control your blood pressure.  Lower your cholesterol.  Control abnormal heart rhythms (arrhythmias). HOME CARE INSTRUCTIONS  Take medicines only as directed by your health care provider.  Do not take the following medicines unless your health care provider approves:  Nonsteroidal anti-inflammatory drugs (NSAIDs), such as ibuprofen, naproxen, or celecoxib.  Vitamin supplements that contain vitamin A, vitamin E, or both.  Hormone replacement therapy that contains estrogen with or without progestin.  Make lifestyle changes as directed by your health care provider. These may include:  Using no tobacco products, including cigarettes,  chewing tobacco, and electronic cigarettes. If you are struggling to quit, ask your health care provider for help.  Exercising as directed by your health care provider. Ask for a list of activities that are safe for you.  Eating a heart-healthy diet. Work with a Museum/gallery exhibitions officer to  learn healthy eating options.  Maintaining a healthy weight.  Managing other medical conditions, like diabetes.  Reducing stress.  Limiting how much alcohol you drink as directed by your health care provider. SEEK IMMEDIATE MEDICAL CARE IF:  You have any symptoms of this condition.   This information is not intended to replace advice given to you by your health care provider. Make sure you discuss any questions you have with your health care provider.   Document Released: 05/14/2005 Document Revised: 01/07/2012 Document Reviewed: 09/22/2014 Elsevier Interactive Patient Education 2016 Else[employer] Inc[employer]   Acute Kidney Injury Acute kidney injury is any condition in which there is sudden (acute) damage to the kidneys. Acute kidney injury was previously known as acute kidney failure or acute renal failure. The kidneys are two organs that lie on either side of the spine between the middle of the back and the front of the abdomen. The kidneys:  Remove wastes and extra water from the blood.   Produce important hormones. These help keep bones strong, regulate blood pressure, and help create red blood cells.   Balance the fluids and chemicals in the blood and tissues. A small amount of kidney damage may not cause problems, but a large amount of damage may make it difficult or impossible for the kidneys to work the way they should. Acute kidney injury may develop into long-lasting (chronic) kidney disease. It may also develop into a life-threatening disease called end-stage kidney disease. Acute kidney injury can get worse very quickly, so it should be treated right away. Early treatment may prevent other kidney diseases from developing. CAUSES   A problem with blood flow to the kidneys. This may be caused by:   Blood loss.   Heart disease.   Severe burns.   Liver disease.  Direct damage to the kidneys. This may be caused by:  Some medicines.   A kidney infection.   Poisoning  or consuming toxic substances.   A surgical wound.   A blow to the kidney area.   A problem with urine flow. This may be caused by:   Cancer.   Kidney stones.   An enlarged prostate. SIGNS AND SYMPTOMS   Swelling (edema) of the legs, ankles, or feet.   Tiredness (lethargy).   Nausea or vomiting.   Confusion.   Problems with urination, such as:   Painful or burning feeling during urination.   Decreased urine production.   Frequent accidents in children who are potty trained.   Bloody urine.   Muscle twitches and cramps.   Shortness of breath.   Seizures.   Chest pain or pressure. Sometimes, no symptoms are present. DIAGNOSIS Acute kidney injury may be detected and diagnosed by tests, including blood, urine, imaging, or kidney biopsy tests.  TREATMENT Treatment of acute kidney injury varies depending on the cause and severity of the kidney damage. In mild cases, no treatment may be needed. The kidneys may heal on their own. If acute kidney injury is more severe, your health care provider will treat the cause of the kidney damage, help the kidneys heal, and prevent complications from occurring. Severe cases may require a procedure to remove toxic wastes from the body (dialysis) or surgery  to repair kidney damage. Surgery may involve:   Repair of a torn kidney.   Removal of an obstruction. HOME CARE INSTRUCTIONS  Follow your prescribed diet.  Take medicines only as directed by your health care provider.  Do not take any new medicines (prescription, over-the-counter, or nutritional supplements) unless approved by your health care provider. Many medicines can worsen your kidney damage or may need to have the dose adjusted.   Keep all follow-up visits as directed by your health care provider. This is important.  Observe your condition to make sure you are healing as expected. SEEK IMMEDIATE MEDICAL CARE IF:  You are feeling ill or have  severe pain in the back or side.   Your symptoms return or you have new symptoms.  You have any symptoms of end-stage kidney disease. These include:   Persistent itchiness.   Loss of appetite.   Headaches.   Abnormally dark or light skin.  Numbness in the hands or feet.   Easy bruising.   Frequent hiccups.   Menstruation stops.   You have a fever.  You have increased urine production.  You have pain or bleeding when urinating. MAKE SURE YOU:   Understand these instructions.  Will watch your condition.  Will get help right away if you are not doing well or get worse.   This information is not intended to replace advice given to you by your health care provider. Make sure you discuss any questions you have with your health care provider.   Document Released: 04/30/2011 Document Revised: 11/05/2014 Document Reviewed: 06/13/2012 Elsevier Interactive Patient Education [employer]

## 2015-11-28 NOTE — Progress Notes (Signed)
Error. No note entered

## 2015-12-08 ENCOUNTER — Inpatient Hospital Stay (HOSPITAL_COMMUNITY): Admission: RE | Admit: 2015-12-08 | Payer: Medicare Other | Source: Ambulatory Visit

## 2015-12-11 ENCOUNTER — Other Ambulatory Visit: Payer: Self-pay | Admitting: Cardiovascular Disease

## 2015-12-11 ENCOUNTER — Other Ambulatory Visit: Payer: Self-pay | Admitting: Endocrinology

## 2015-12-12 NOTE — Telephone Encounter (Signed)
Rx(s) sent to pharmacy electronically.  

## 2015-12-13 ENCOUNTER — Encounter (HOSPITAL_COMMUNITY)
Admission: RE | Admit: 2015-12-13 | Discharge: 2015-12-13 | Disposition: A | Discharge status: Home / Self Care | Payer: Medicare Other | Source: Ambulatory Visit | Attending: Nephrology | Admitting: Nephrology

## 2015-12-13 ENCOUNTER — Other Ambulatory Visit: Payer: Self-pay | Admitting: Nephrology

## 2015-12-13 DIAGNOSIS — D631 Anemia in chronic kidney disease: Secondary | ICD-10-CM | POA: Diagnosis present

## 2015-12-13 DIAGNOSIS — N183 Chronic kidney disease, stage 3 (moderate): Secondary | ICD-10-CM | POA: Diagnosis not present

## 2015-12-13 LAB — IRON AND TIBC
IRON: 52 ug/dL (ref 45–182)
SATURATION RATIOS: 25 % (ref 17.9–39.5)
TIBC: 204 ug/dL — ABNORMAL LOW (ref 250–450)
UIBC: 152 ug/dL

## 2015-12-13 LAB — FERRITIN: FERRITIN: 54 ng/mL (ref 24–336)

## 2015-12-13 LAB — POCT HEMOGLOBIN-HEMACUE: HEMOGLOBIN: 7.8 g/dL — AB (ref 13.0–17.0)

## 2015-12-13 MED ORDER — EPOETIN ALFA 40000 UNIT/ML IJ SOLN
INTRAMUSCULAR | Status: AC
  Administered 2015-12-13: 40000 [IU] via SUBCUTANEOUS
  Filled 2015-12-13: qty 1

## 2015-12-13 MED ORDER — EPOETIN ALFA 40000 UNIT/ML IJ SOLN
40000.0000 [IU] | INTRAMUSCULAR | Status: DC
  Administered 2015-12-13: 40000 [IU] via SUBCUTANEOUS

## 2015-12-13 MED ORDER — SODIUM CHLORIDE 0.9 % IV SOLN
510.0000 mg | Freq: Once | INTRAVENOUS | Status: AC
  Administered 2015-12-13: 510 mg via INTRAVENOUS
  Filled 2015-12-13: qty 17

## 2015-12-15 ENCOUNTER — Other Ambulatory Visit: Payer: Medicare Other

## 2015-12-17 ENCOUNTER — Other Ambulatory Visit: Payer: Self-pay | Admitting: Physician Assistant

## 2015-12-19 NOTE — Telephone Encounter (Signed)
Rx request sent to pharmacy.  

## 2015-12-19 NOTE — Telephone Encounter (Signed)
lmtcb

## 2015-12-20 ENCOUNTER — Ambulatory Visit: Payer: Medicare Other | Admitting: Endocrinology

## 2015-12-20 ENCOUNTER — Telehealth: Payer: Self-pay | Admitting: Endocrinology

## 2015-12-20 ENCOUNTER — Encounter: Payer: Self-pay | Admitting: *Deleted

## 2015-12-20 NOTE — Telephone Encounter (Signed)
Patient no showed today's appt. Please advise on how to follow up. °A. No follow up necessary. °B. Follow up urgent. Contact patient immediately. °C. Follow up necessary. Contact patient and schedule visit in ___ days. °D. Follow up advised. Contact patient and schedule visit in ____weeks. ° °

## 2015-12-20 NOTE — Telephone Encounter (Signed)
Letter mailed

## 2015-12-20 NOTE — Telephone Encounter (Signed)
Please schedule follow-up appointment as soon as possible 

## 2015-12-26 ENCOUNTER — Other Ambulatory Visit (HOSPITAL_COMMUNITY): Payer: Self-pay | Admitting: *Deleted

## 2015-12-27 ENCOUNTER — Ambulatory Visit (HOSPITAL_COMMUNITY)
Admission: RE | Admit: 2015-12-27 | Discharge: 2015-12-27 | Disposition: A | Discharge status: Home / Self Care | Payer: Medicare Other | Source: Ambulatory Visit | Attending: Nephrology | Admitting: Nephrology

## 2015-12-27 DIAGNOSIS — N183 Chronic kidney disease, stage 3 (moderate): Secondary | ICD-10-CM | POA: Diagnosis present

## 2015-12-27 DIAGNOSIS — Z5181 Encounter for therapeutic drug level monitoring: Secondary | ICD-10-CM | POA: Insufficient documentation

## 2015-12-27 DIAGNOSIS — D631 Anemia in chronic kidney disease: Secondary | ICD-10-CM | POA: Insufficient documentation

## 2015-12-27 DIAGNOSIS — D509 Iron deficiency anemia, unspecified: Secondary | ICD-10-CM | POA: Insufficient documentation

## 2015-12-27 DIAGNOSIS — Z79899 Other long term (current) drug therapy: Secondary | ICD-10-CM | POA: Diagnosis not present

## 2015-12-27 LAB — POCT HEMOGLOBIN-HEMACUE: Hemoglobin: 8.7 g/dL — ABNORMAL LOW (ref 13.0–17.0)

## 2015-12-27 MED ORDER — EPOETIN ALFA 40000 UNIT/ML IJ SOLN
INTRAMUSCULAR | Status: AC
  Filled 2015-12-27: qty 1

## 2015-12-27 MED ORDER — SODIUM CHLORIDE 0.9 % IV SOLN
510.0000 mg | Freq: Once | INTRAVENOUS | Status: AC
  Administered 2015-12-27: 510 mg via INTRAVENOUS
  Filled 2015-12-27: qty 17

## 2015-12-27 MED ORDER — EPOETIN ALFA 40000 UNIT/ML IJ SOLN
40000.0000 [IU] | INTRAMUSCULAR | Status: DC
  Administered 2015-12-27: 40000 [IU] via SUBCUTANEOUS

## 2015-12-28 ENCOUNTER — Other Ambulatory Visit: Payer: Medicare Other

## 2015-12-29 ENCOUNTER — Other Ambulatory Visit: Payer: Self-pay

## 2015-12-30 ENCOUNTER — Ambulatory Visit: Payer: Medicare Other | Admitting: Endocrinology

## 2016-01-03 ENCOUNTER — Other Ambulatory Visit: Payer: Self-pay | Admitting: *Deleted

## 2016-01-03 DIAGNOSIS — N186 End stage renal disease: Secondary | ICD-10-CM

## 2016-01-03 DIAGNOSIS — Z01810 Encounter for preprocedural cardiovascular examination: Secondary | ICD-10-CM

## 2016-01-04 ENCOUNTER — Ambulatory Visit (HOSPITAL_COMMUNITY)
Admission: RE | Admit: 2016-01-04 | Discharge: 2016-01-04 | Disposition: A | Discharge status: Home / Self Care | Payer: Medicare Other | Source: Ambulatory Visit | Attending: Vascular Surgery | Admitting: Vascular Surgery

## 2016-01-04 DIAGNOSIS — E7800 Pure hypercholesterolemia, unspecified: Secondary | ICD-10-CM | POA: Diagnosis not present

## 2016-01-04 DIAGNOSIS — Z01810 Encounter for preprocedural cardiovascular examination: Secondary | ICD-10-CM

## 2016-01-04 DIAGNOSIS — I120 Hypertensive chronic kidney disease with stage 5 chronic kidney disease or end stage renal disease: Secondary | ICD-10-CM | POA: Insufficient documentation

## 2016-01-04 DIAGNOSIS — E1122 Type 2 diabetes mellitus with diabetic chronic kidney disease: Secondary | ICD-10-CM | POA: Diagnosis not present

## 2016-01-04 DIAGNOSIS — N186 End stage renal disease: Secondary | ICD-10-CM | POA: Diagnosis not present

## 2016-01-09 ENCOUNTER — Other Ambulatory Visit (HOSPITAL_COMMUNITY): Payer: Self-pay | Admitting: *Deleted

## 2016-01-10 ENCOUNTER — Inpatient Hospital Stay (HOSPITAL_COMMUNITY): Admission: RE | Admit: 2016-01-10 | Payer: Medicare Other | Source: Ambulatory Visit

## 2016-01-12 ENCOUNTER — Encounter (HOSPITAL_COMMUNITY)
Admission: RE | Admit: 2016-01-12 | Discharge: 2016-01-12 | Disposition: A | Discharge status: Home / Self Care | Payer: Medicare Other | Source: Ambulatory Visit | Attending: Nephrology | Admitting: Nephrology

## 2016-01-12 DIAGNOSIS — D631 Anemia in chronic kidney disease: Secondary | ICD-10-CM | POA: Insufficient documentation

## 2016-01-12 DIAGNOSIS — N183 Chronic kidney disease, stage 3 (moderate): Secondary | ICD-10-CM | POA: Diagnosis not present

## 2016-01-12 LAB — POCT HEMOGLOBIN-HEMACUE: Hemoglobin: 9.7 g/dL — ABNORMAL LOW (ref 13.0–17.0)

## 2016-01-12 LAB — IRON AND TIBC
IRON: 51 ug/dL (ref 45–182)
Saturation Ratios: 27 % (ref 17.9–39.5)
TIBC: 189 ug/dL — ABNORMAL LOW (ref 250–450)
UIBC: 138 ug/dL

## 2016-01-12 LAB — FERRITIN: FERRITIN: 198 ng/mL (ref 24–336)

## 2016-01-12 MED ORDER — EPOETIN ALFA 40000 UNIT/ML IJ SOLN
INTRAMUSCULAR | Status: AC
  Administered 2016-01-12: 40000 [IU] via SUBCUTANEOUS
  Filled 2016-01-12: qty 1

## 2016-01-14 ENCOUNTER — Other Ambulatory Visit: Payer: Self-pay | Admitting: Nurse Practitioner

## 2016-01-19 NOTE — Progress Notes (Signed)
Anesthesia Chart Review:  Pt is an 82 year old male scheduled for R brachiocephalic AV fistula creation on 01/25/2016 with Dr. Darrick PennaFields.   Pt is a same day work up.   Cardiologist is Dr. Yates DecampJay Ganji. Nephrologist is Dr. Fayrene FearingJames Deterding. PCP is Dr. Lupita RaiderKimberlee Shaw.   PMH includes:  CAD (mild scattered disease by cath 11/16/15), carotid artery disease (s/p L CEA 2005), PVD (s/p L iliac artery stenting), HTN, DM, hyperlipidemia, CKD (stage V), palpitations (PVCs and PSVT). Former smoker. BMI 26.  Pt hospitalized 1/18-1/28/17 for NSTEMI. Complicated by acute diastolic CHF, acute respiratory failure, anemia (s/p 4 units PRBCs), acute on chronic kidney disease (started on HD this admission).   Medications include: amlodipine, ASA, lipitor, clonidine, plavix, iron, imdur, januvia, metoprolol, protonix.   Labs will be obtained DOS.   1 view CXR 11/23/15: Perihilar infiltrates likely pulmonary edema, improved. No pleural effusion or pneumothorax.  EKG 11/17/15: NSR. Possible Left atrial enlargement. LVH with repolarization abnormality. Prolonged QT.  Note EKG was in the setting of acute illness, will repeat DOS.   Echo 11/16/15:  - Left ventricle: The cavity size was normal. There was mild concentric hypertrophy. Systolic function was at the lower limits of normal. The estimated ejection fraction was in the range of 50% to 55%. Mild diffuse hypokinesis. Features are consistent with a pseudonormal left ventricular filling pattern, with concomitant abnormal relaxation and increased filling pressure (grade 2 diastolic dysfunction). Doppler parameters are consistent with both elevated ventricular end-diastolic filling pressure and elevated left atrial filling pressure. - Mitral valve: Calcified annulus. - Atrial septum: No defect or patent foramen ovale was identified. - Pulmonary arteries: PA peak pressure: 75 mm Hg (S). - Pericardium, extracardiac: A small pericardial effusion was identified. The fluid had no  internal echoes.There was no evidence of hemodynamic compromise. - Impressions: The right ventricular systolic pressure was increased consistent with severe pulmonary hypertension.  Cardiac cath 11/16/15:  1. LVEF appears to be about 45%. Needs echocardiogram. 2. Diffuse coronary calcification, there was damping of the pressures across the left main coronary artery and high-grade lesion was suspected. IVUS performed revealing no hemodynamically significant stenosis. Mild scattered disease throughout the left and right coronary arteries. 3. Impression: Presentation more consistent with demand ischemia, severe anemia and metabolic issues.  If labs and EKG acceptable DOS, I anticipate pt can proceed as scheduled.   Rica Mastngela Kabbe, FNP-BC [facility] Phone: [phone] 01/19/2016 12:12 PM

## 2016-01-23 ENCOUNTER — Other Ambulatory Visit (HOSPITAL_COMMUNITY): Payer: Self-pay | Admitting: *Deleted

## 2016-01-24 ENCOUNTER — Encounter (HOSPITAL_COMMUNITY)
Admission: RE | Admit: 2016-01-24 | Discharge: 2016-01-24 | Disposition: A | Discharge status: Home / Self Care | Payer: Medicare Other | Source: Ambulatory Visit | Attending: Nephrology | Admitting: Nephrology

## 2016-01-24 ENCOUNTER — Encounter (HOSPITAL_COMMUNITY): Payer: Self-pay | Admitting: *Deleted

## 2016-01-24 DIAGNOSIS — D631 Anemia in chronic kidney disease: Secondary | ICD-10-CM | POA: Diagnosis not present

## 2016-01-24 LAB — POCT HEMOGLOBIN-HEMACUE: Hemoglobin: 10 g/dL — ABNORMAL LOW (ref 13.0–17.0)

## 2016-01-24 MED ORDER — SODIUM CHLORIDE 0.9 % IV SOLN
510.0000 mg | Freq: Once | INTRAVENOUS | Status: AC
  Administered 2016-01-24: 510 mg via INTRAVENOUS
  Filled 2016-01-24: qty 17

## 2016-01-24 MED ORDER — EPOETIN ALFA 40000 UNIT/ML IJ SOLN
INTRAMUSCULAR | Status: AC
  Administered 2016-01-24: 40000 [IU] via SUBCUTANEOUS
  Filled 2016-01-24: qty 1

## 2016-01-24 MED ORDER — SODIUM CHLORIDE 0.9 % IV SOLN
INTRAVENOUS | Status: DC
  Administered 2016-01-25 (×2): via INTRAVENOUS

## 2016-01-24 MED ORDER — CEFUROXIME SODIUM 1.5 G IJ SOLR
1.5000 g | INTRAMUSCULAR | Status: AC
  Administered 2016-01-25: 1.5 g via INTRAVENOUS
  Filled 2016-01-24: qty 1.5

## 2016-01-24 MED ORDER — EPOETIN ALFA 20000 UNIT/ML IJ SOLN: 20000.0000 [IU] | INTRAMUSCULAR | Status: DC

## 2016-01-24 NOTE — Progress Notes (Signed)
Pt SDW-pre-op call completed by both pt and pt daughter, Eunice BlaseDebbie, with pt consent. Pt denies any acute cardiopulmonary issues. Pt daughter stated that pt last dose of Plavix was Friday, 01/20/16 as instructed by MD. Pt daughter made aware to stop vitamins, fish oil, herbal medications and NSAID's. Pt daughter is aware to hold Januvia on DOS. Pt daughter verbalized understanding of all pre-op instructions. Anesthesia asked to review pt history; repeat EKG DOS ( see note).

## 2016-01-25 ENCOUNTER — Encounter (HOSPITAL_COMMUNITY): Payer: Self-pay | Admitting: Surgery

## 2016-01-25 ENCOUNTER — Telehealth: Payer: Self-pay | Admitting: Vascular Surgery

## 2016-01-25 ENCOUNTER — Ambulatory Visit (HOSPITAL_COMMUNITY): Payer: Medicare Other | Admitting: Emergency Medicine

## 2016-01-25 ENCOUNTER — Other Ambulatory Visit: Payer: Self-pay | Admitting: *Deleted

## 2016-01-25 ENCOUNTER — Ambulatory Visit (HOSPITAL_COMMUNITY)
Admission: RE | Admit: 2016-01-25 | Discharge: 2016-01-25 | Disposition: A | Discharge status: Home / Self Care | Payer: Medicare Other | Source: Ambulatory Visit | Attending: Vascular Surgery | Admitting: Vascular Surgery

## 2016-01-25 ENCOUNTER — Encounter (HOSPITAL_COMMUNITY)
Admission: RE | Disposition: A | Discharge status: Home / Self Care | Payer: Self-pay | Source: Ambulatory Visit | Attending: Vascular Surgery

## 2016-01-25 DIAGNOSIS — Z992 Dependence on renal dialysis: Secondary | ICD-10-CM | POA: Diagnosis not present

## 2016-01-25 DIAGNOSIS — I120 Hypertensive chronic kidney disease with stage 5 chronic kidney disease or end stage renal disease: Secondary | ICD-10-CM | POA: Diagnosis present

## 2016-01-25 DIAGNOSIS — N186 End stage renal disease: Secondary | ICD-10-CM

## 2016-01-25 DIAGNOSIS — E1122 Type 2 diabetes mellitus with diabetic chronic kidney disease: Secondary | ICD-10-CM | POA: Insufficient documentation

## 2016-01-25 DIAGNOSIS — Z4931 Encounter for adequacy testing for hemodialysis: Secondary | ICD-10-CM

## 2016-01-25 DIAGNOSIS — Z7982 Long term (current) use of aspirin: Secondary | ICD-10-CM | POA: Diagnosis not present

## 2016-01-25 DIAGNOSIS — Z87891 Personal history of nicotine dependence: Secondary | ICD-10-CM | POA: Insufficient documentation

## 2016-01-25 DIAGNOSIS — N185 Chronic kidney disease, stage 5: Secondary | ICD-10-CM | POA: Diagnosis not present

## 2016-01-25 DIAGNOSIS — I2510 Atherosclerotic heart disease of native coronary artery without angina pectoris: Secondary | ICD-10-CM | POA: Diagnosis not present

## 2016-01-25 HISTORY — PX: AV FISTULA PLACEMENT: SHX1204

## 2016-01-25 LAB — POCT I-STAT 4, (NA,K, GLUC, HGB,HCT)
Glucose, Bld: 143 mg/dL — ABNORMAL HIGH (ref 65–99)
HCT: 34 % — ABNORMAL LOW (ref 39.0–52.0)
HEMOGLOBIN: 11.6 g/dL — AB (ref 13.0–17.0)
POTASSIUM: 3 mmol/L — AB (ref 3.5–5.1)
Sodium: 139 mmol/L (ref 135–145)

## 2016-01-25 LAB — GLUCOSE, CAPILLARY
GLUCOSE-CAPILLARY: 159 mg/dL — AB (ref 65–99)
Glucose-Capillary: 140 mg/dL — ABNORMAL HIGH (ref 65–99)

## 2016-01-25 SURGERY — ARTERIOVENOUS (AV) FISTULA CREATION
Anesthesia: General | Site: Arm Upper | Laterality: Right

## 2016-01-25 MED ORDER — SODIUM CHLORIDE 0.9 % IV SOLN
INTRAVENOUS | Status: DC | PRN
  Administered 2016-01-25: 11:00:00

## 2016-01-25 MED ORDER — HEPARIN SODIUM (PORCINE) 1000 UNIT/ML IJ SOLN
INTRAMUSCULAR | Status: DC | PRN
  Administered 2016-01-25: 5000 [IU] via INTRAVENOUS

## 2016-01-25 MED ORDER — FENTANYL CITRATE (PF) 100 MCG/2ML IJ SOLN: 25.0000 ug | INTRAMUSCULAR | Status: DC | PRN

## 2016-01-25 MED ORDER — PROTAMINE SULFATE 10 MG/ML IV SOLN
INTRAVENOUS | Status: DC | PRN
  Administered 2016-01-25: 20 mg via INTRAVENOUS
  Administered 2016-01-25: 10 mg via INTRAVENOUS
  Administered 2016-01-25: 20 mg via INTRAVENOUS

## 2016-01-25 MED ORDER — LIDOCAINE HCL (CARDIAC) 20 MG/ML IV SOLN
INTRAVENOUS | Status: AC
  Filled 2016-01-25: qty 10

## 2016-01-25 MED ORDER — PROTAMINE SULFATE 10 MG/ML IV SOLN
INTRAVENOUS | Status: AC
  Filled 2016-01-25: qty 5

## 2016-01-25 MED ORDER — GLYCOPYRROLATE 0.2 MG/ML IJ SOLN
0.2000 mg | Freq: Once | INTRAMUSCULAR | Status: AC
  Administered 2016-01-25: 0.2 mg via INTRAVENOUS

## 2016-01-25 MED ORDER — FENTANYL CITRATE (PF) 100 MCG/2ML IJ SOLN
INTRAMUSCULAR | Status: DC | PRN
  Administered 2016-01-25 (×3): 50 ug via INTRAVENOUS

## 2016-01-25 MED ORDER — 0.9 % SODIUM CHLORIDE (POUR BTL) OPTIME
TOPICAL | Status: DC | PRN
  Administered 2016-01-25: 1000 mL

## 2016-01-25 MED ORDER — LIDOCAINE HCL (CARDIAC) 20 MG/ML IV SOLN
INTRAVENOUS | Status: DC | PRN
  Administered 2016-01-25: 60 mg via INTRAVENOUS

## 2016-01-25 MED ORDER — EPHEDRINE SULFATE 50 MG/ML IJ SOLN
INTRAMUSCULAR | Status: DC | PRN
  Administered 2016-01-25: 10 mg via INTRAVENOUS
  Administered 2016-01-25 (×2): 5 mg via INTRAVENOUS
  Administered 2016-01-25: 10 mg via INTRAVENOUS

## 2016-01-25 MED ORDER — CHLORHEXIDINE GLUCONATE CLOTH 2 % EX PADS: 6.0000 | MEDICATED_PAD | Freq: Once | CUTANEOUS | Status: DC

## 2016-01-25 MED ORDER — ROCURONIUM BROMIDE 50 MG/5ML IV SOLN
INTRAVENOUS | Status: AC
  Filled 2016-01-25: qty 1

## 2016-01-25 MED ORDER — ONDANSETRON HCL 4 MG/2ML IJ SOLN
INTRAMUSCULAR | Status: DC | PRN
  Administered 2016-01-25: 4 mg via INTRAVENOUS

## 2016-01-25 MED ORDER — OXYCODONE HCL 5 MG PO TABS: 5.0000 mg | ORAL_TABLET | Freq: Once | ORAL | Status: DC | PRN

## 2016-01-25 MED ORDER — GLYCOPYRROLATE 0.2 MG/ML IJ SOLN
INTRAMUSCULAR | Status: AC
  Filled 2016-01-25: qty 1

## 2016-01-25 MED ORDER — FENTANYL CITRATE (PF) 250 MCG/5ML IJ SOLN
INTRAMUSCULAR | Status: AC
  Filled 2016-01-25: qty 5

## 2016-01-25 MED ORDER — PROPOFOL 10 MG/ML IV BOLUS
INTRAVENOUS | Status: DC | PRN
  Administered 2016-01-25: 50 mg via INTRAVENOUS
  Administered 2016-01-25: 150 mg via INTRAVENOUS

## 2016-01-25 MED ORDER — SUCCINYLCHOLINE CHLORIDE 20 MG/ML IJ SOLN
INTRAMUSCULAR | Status: AC
  Filled 2016-01-25: qty 1

## 2016-01-25 MED ORDER — LIDOCAINE HCL (PF) 1 % IJ SOLN
INTRAMUSCULAR | Status: AC
  Filled 2016-01-25: qty 30

## 2016-01-25 MED ORDER — OXYCODONE HCL 5 MG/5ML PO SOLN: 5.0000 mg | Freq: Once | ORAL | Status: DC | PRN

## 2016-01-25 MED ORDER — EPHEDRINE SULFATE 50 MG/ML IJ SOLN
INTRAMUSCULAR | Status: AC
  Filled 2016-01-25: qty 1

## 2016-01-25 MED ORDER — SODIUM CHLORIDE 0.9 % IJ SOLN
INTRAMUSCULAR | Status: AC
  Filled 2016-01-25: qty 10

## 2016-01-25 MED ORDER — ONDANSETRON HCL 4 MG/2ML IJ SOLN: 4.0000 mg | Freq: Four times a day (QID) | INTRAMUSCULAR | Status: DC | PRN

## 2016-01-25 MED ORDER — OXYCODONE-ACETAMINOPHEN 5-325 MG PO TABS: 1.0000 | ORAL_TABLET | Freq: Four times a day (QID) | ORAL | Status: DC | PRN

## 2016-01-25 MED ORDER — PROPOFOL 10 MG/ML IV BOLUS
INTRAVENOUS | Status: AC
  Filled 2016-01-25: qty 20

## 2016-01-25 SURGICAL SUPPLY — 33 items
ARMBAND PINK RESTRICT EXTREMIT (MISCELLANEOUS) ×2 IMPLANT
CANISTER SUCTION 2500CC (MISCELLANEOUS) ×2 IMPLANT
CANNULA VESSEL 3MM 2 BLNT TIP (CANNULA) ×2 IMPLANT
CLIP TI MEDIUM 6 (CLIP) ×2 IMPLANT
CLIP TI WIDE RED SMALL 6 (CLIP) ×2 IMPLANT
DECANTER SPIKE VIAL GLASS SM (MISCELLANEOUS) ×2 IMPLANT
DRAIN PENROSE 1/4X12 LTX STRL (WOUND CARE) ×2 IMPLANT
ELECT REM PT RETURN 9FT ADLT (ELECTROSURGICAL) ×2
ELECTRODE REM PT RTRN 9FT ADLT (ELECTROSURGICAL) ×1 IMPLANT
GLOVE BIO SURGEON STRL SZ 6.5 (GLOVE) ×4 IMPLANT
GLOVE BIO SURGEON STRL SZ7.5 (GLOVE) ×2 IMPLANT
GLOVE BIOGEL PI IND STRL 6.5 (GLOVE) ×2 IMPLANT
GLOVE BIOGEL PI IND STRL 7.0 (GLOVE) ×1 IMPLANT
GLOVE BIOGEL PI IND STRL 8 (GLOVE) ×1 IMPLANT
GLOVE BIOGEL PI INDICATOR 6.5 (GLOVE) ×2
GLOVE BIOGEL PI INDICATOR 7.0 (GLOVE) ×1
GLOVE BIOGEL PI INDICATOR 8 (GLOVE) ×1
GOWN STRL REUS W/ TWL LRG LVL3 (GOWN DISPOSABLE) ×3 IMPLANT
GOWN STRL REUS W/TWL LRG LVL3 (GOWN DISPOSABLE) ×3
KIT BASIN OR (CUSTOM PROCEDURE TRAY) ×2 IMPLANT
KIT ROOM TURNOVER OR (KITS) ×2 IMPLANT
LIQUID BAND (GAUZE/BANDAGES/DRESSINGS) ×2 IMPLANT
LOOP VESSEL MINI RED (MISCELLANEOUS) IMPLANT
NS IRRIG 1000ML POUR BTL (IV SOLUTION) ×2 IMPLANT
PACK CV ACCESS (CUSTOM PROCEDURE TRAY) ×2 IMPLANT
PAD ARMBOARD 7.5X6 YLW CONV (MISCELLANEOUS) ×4 IMPLANT
SPONGE SURGIFOAM ABS GEL 100 (HEMOSTASIS) IMPLANT
SUT PROLENE 7 0 BV 1 (SUTURE) ×6 IMPLANT
SUT VIC AB 3-0 SH 27 (SUTURE) ×1
SUT VIC AB 3-0 SH 27X BRD (SUTURE) ×1 IMPLANT
SUT VICRYL 4-0 PS2 18IN ABS (SUTURE) ×2 IMPLANT
UNDERPAD 30X30 INCONTINENT (UNDERPADS AND DIAPERS) ×2 IMPLANT
WATER STERILE IRR 1000ML POUR (IV SOLUTION) ×2 IMPLANT

## 2016-01-25 NOTE — Anesthesia Postprocedure Evaluation (Signed)
Anesthesia Post Note  Patient: [name]  Procedure(s) Performed: Procedure(s) (LRB): BRACHIOCEPHALIC ARTERIOVENOUS (AV) FISTULA CREATION (Right)  Patient location during evaluation: PACU Anesthesia Type: General Level of consciousness: awake and alert and patient cooperative Pain management: pain level controlled Vital Signs Assessment: post-procedure vital signs reviewed and stable Respiratory status: spontaneous breathing and respiratory function stable Cardiovascular status: stable Anesthetic complications: no    Last Vitals:  Filed Vitals:   01/25/16 1400 01/25/16 1415  BP: 159/57 171/68  Pulse:  80  Temp:  36.4 C  Resp:      Last Pain:  Filed Vitals:   01/25/16 1430  PainSc: 0-No pain                 Kiyo Heal S

## 2016-01-25 NOTE — Anesthesia Procedure Notes (Signed)
Procedure Name: LMA Insertion Date/Time: 01/25/2016 10:56 AM Performed by: Fransisca KaufmannMEYER, Christos Mixson E Pre-anesthesia Checklist: Patient identified, Emergency Drugs available, Suction available and Patient being monitored Patient Re-evaluated:Patient Re-evaluated prior to inductionOxygen Delivery Method: Circle System Utilized Preoxygenation: Pre-oxygenation with 100% oxygen Intubation Type: IV induction Ventilation: Mask ventilation without difficulty LMA: LMA inserted LMA Size: 4.0 Number of attempts: 1 Airway Equipment and Method: Bite block Placement Confirmation: positive ETCO2 and breath sounds checked- equal and bilateral Tube secured with: Tape Dental Injury: Teeth and Oropharynx as per pre-operative assessment

## 2016-01-25 NOTE — Op Note (Signed)
Procedure: Right Brachial Cephalic AV fistula  Preop: ESRD  Postop: ESRD  Anesthesia: General  Assistant: Karsten RoKim Trinh, PA-C  Findings: 3.5 mm cephalic vein  Procedure: After obtaining informed consent, the patient was taken to the operating room.  After induction of general anesthesia, the right upper extremity was prepped and draped in usual sterile fashion.  A transverse incision was then made near the antecubital crease the right arm. The incision was carried into the subcutaneous tissues down to level of the cephalic vein. The cephalic vein was approximately 3.5 mm in diameter. It was of good quality. This was dissected free circumferentially and small side branches ligated and divided between silk ties or clips. Next the brachial artery was dissected free in the medial portion of the incision. The artery was 4 mm in diameter.  It was heavily calcified. The vessel loops were placed proximal and distal to the planned site of arteriotomy. The patient was given 5000 units of intravenous heparin. After appropriate circulation time, fistula clamps were used to control the artery. A longitudinal opening was made in the brachial artery.  The vein was ligated distally with a 2-0 silk tie. The vein was controlled proximally with a fine bulldog clamp. The vein was then swung over to the artery and sewn end of vein to side of artery using a running 7-0 Prolene suture. Just prior to completion of the anastomosis, everything was fore bled back bled and thoroughly flushed. The anastomosis was secured, vessel loops released, and there was a palpable thrill in the fistula immediately. After hemostasis was obtained, the subcutaneous tissues were reapproximated using a running 3-0 Vicryl suture. The skin was then closed with a 4 0 Vicryl subcuticular stitch. Dermabond was applied to the skin incision.  The patient had a palpable radial pulse at the end of the case.  Fabienne Brunsharles Sally Menard, MD [facility] [place] Office: [phone] Pager: [phone]

## 2016-01-25 NOTE — Transfer of Care (Signed)
Immediate Anesthesia Transfer of Care Note  Patient: [name]  Procedure(s) Performed: Procedure(s): BRACHIOCEPHALIC ARTERIOVENOUS (AV) FISTULA CREATION (Right)  Patient Location: PACU  Anesthesia Type:General  Level of Consciousness: awake, alert , oriented and sedated  Airway & Oxygen Therapy: Patient Spontanous Breathing and Patient connected to nasal cannula oxygen  Post-op Assessment: Report given to RN, Post -op Vital signs reviewed and stable and Patient moving all extremities  Post vital signs: Reviewed and stable  Last Vitals:  Filed Vitals:   01/25/16 0955  BP: 187/41  Pulse: 65  Temp: 36.7 C  Resp: 20    Complications: No apparent anesthesia complications

## 2016-01-25 NOTE — Telephone Encounter (Signed)
LM for pt re appt, mailed letter, dpm °

## 2016-01-25 NOTE — Telephone Encounter (Signed)
-----   Message from Sharee PimpleMarilyn K McChesney, RN sent at 01/25/2016 12:32 PM EDT ----- Regarding: schedule   ----- Message -----    From: Raymond GurneyKimberly A Trinh, PA-C    Sent: 01/25/2016  12:18 PM      To: [facility] Charge Pool  S/p right brachial cephalic AV fistual 01/25/16  F/u with Dr. Darrick PennaFields in 6 weeks with duplex  Thanks Selena BattenKim

## 2016-01-25 NOTE — Anesthesia Preprocedure Evaluation (Signed)
Anesthesia Evaluation  Patient identified by MRN, date of birth, ID band Patient awake    Reviewed: Allergy & Precautions, H&P , NPO status , Patient's Chart, lab work & pertinent test results  Airway Mallampati: II   Neck ROM: full    Dental   Pulmonary former smoker,    breath sounds clear to auscultation       Cardiovascular hypertension, + CAD, + Peripheral Vascular Disease and +CHF   Rhythm:regular Rate:Normal     Neuro/Psych    GI/Hepatic   Endo/Other  diabetes, Type 2  Renal/GU ESRFRenal disease     Musculoskeletal   Abdominal   Peds  Hematology   Anesthesia Other Findings   Reproductive/Obstetrics                             Anesthesia Physical Anesthesia Plan  ASA: III  Anesthesia Plan: General   Post-op Pain Management:    Induction: Intravenous  Airway Management Planned: LMA  Additional Equipment:   Intra-op Plan:   Post-operative Plan:   Informed Consent: I have reviewed the patients History and Physical, chart, labs and discussed the procedure including the risks, benefits and alternatives for the proposed anesthesia with the patient or authorized representative who has indicated his/her understanding and acceptance.     Plan Discussed with: CRNA, Anesthesiologist and Surgeon  Anesthesia Plan Comments:         Anesthesia Quick Evaluation

## 2016-01-25 NOTE — H&P (Signed)
[facility] [place] HISTORY AND PHYSICAL   History of Present Illness: Patient is a 82 y.o. male who presents for placement of a permanent hemodialysis access. He is referred by Dr. Darrick Penna.The patient is right handed. The patient is not currently on hemodialysis. Other chronic medical problems include Diabetes, hypertension, hypercholesterolemia , coronary artery disease all of which are currently stable..  Past Medical History  Diagnosis Date  . Diabetes mellitus   . Hypertension   . Hypercholesteremia   . Gout   . PVD (peripheral vascular disease) (HCC) 7/05    LCE, known 60% RICA 7/12  . CAD (coronary artery disease) 2002    non critical  . Macular degeneration   . Chronic renal disease, stage III   . Claudication (HCC)   . Palpitations     PVCs and PSVT on Holter monitoring  . Bilateral carotid artery disease (HCC)     status post left carotid endarterectomy performed by Dr. Madilyn Fireman 05/12/04  . Anemia     Past Surgical History  Procedure Laterality Date  . 2d echocardiogram  03/31/2008    EF greater than 55%  . Cardiovascular stress test  06/30/2010    Nonischemic. Low risk  . Cerebral angiogram  04/03/2004    High-grade 80% ostial L ICA stenosis. Medical management.  . Abdominal aortogram  09/01/2007    Widely patent renal arteries. Medical management.  . Peripheral vascular angiogram  05/07/2011    No evidence of intracranial occlusions, stenosis, dissections, or aneurysms seen  . Carotid doppler  06/09/2012    R Vertebral-known occluded vessel, R Bulb/Proximal ICA-moderate to severe amount of plaque w/50-69% diameter reduction, L Subclavian-50-69% diameter reduction, L CEA-demonstrated increased velocities w/o evidence of hemodynamically significant stenosis  . Cardiac catheterization  10/17/2001    No significant CAD, normal LV systolic  function.  . Carotid endarterectomy Left July 2005  . Lower extremity angiogram Bilateral 11/01/2014    Procedure: LOWER EXTREMITY ANGIOGRAM; Surgeon: [name], MD; Location[facility] CATH LAB; Service: Cardiovascular; Laterality: Bilateral;  . Abdominal angiogram  11/01/2014    Procedure: ABDOMINAL ANGIOGRAM; Surgeon: [name], MD; Location: [facility] CATH LAB; Service: Cardiovascular;;  . Knee surgery    . Hernia repair       Social History Social History  Substance Use Topics  . Smoking status: Former Smoker -- 1.00 packs/day for 40 years  . Smokeless tobacco: Never Used     Comment: quit approx. 20 years ago.  . Alcohol Use: No    Family History Family History  Problem Relation Age of Onset  . Stroke Sister   . Heart disease Brother   . Diabetes Neg Hx     Allergies  Allergies  Allergen Reactions  . Labetalol Swelling    angioedema     Current Outpatient Prescriptions  Medication Sig Dispense Refill  . ACCU-CHEK FASTCLIX LANCETS MISC Use to check blood sugar 2 times per day dx code E11.9 102 each 2  . allopurinol (ZYLOPRIM) 100 MG tablet TAKE 1 TABLET (100 MG TOTAL) BY MOUTH AT BEDTIME. 30 tablet 2  . amLODipine (NORVASC) 10 MG tablet Take 1 tablet (10 mg total) by mouth at bedtime. 10 tablet 1  . aspirin EC 81 MG tablet Take 81 mg by mouth every evening.    Marland Kitchen atorvastatin (LIPITOR) 80 MG tablet Take 1 tablet (80 mg total) by mouth daily at 6 PM. 30 tablet 1  . calcitRIOL (ROCALTROL) 0.25 MCG capsule Take 0.25 mcg by mouth  every morning.     . cholecalciferol (VITAMIN D) 1000 UNITS tablet Take 1,000 Units by mouth 2 (two) times daily.     . clopidogrel (PLAVIX) 75 MG tablet TAKE 1 TABLET BY MOUTH DAILY 30 tablet 10  . ferrous sulfate 325 (65 FE) MG tablet Take 325 mg by mouth 2 (two) times daily with a meal.    . furosemide (LASIX) 80 MG  tablet Take 1 tablet (80 mg total) by mouth daily. 30 tablet 1  . glucose blood (ACCU-CHEK AVIVA PLUS) test strip Use as instructed to check blood sugar 2 times per day dx code E11.9 100 each 3  . hydrALAZINE (APRESOLINE) 50 MG tablet Take 1 tablet (50 mg total) by mouth 2 (two) times daily. (Patient taking differently: Take 50 mg by mouth 3 (three) times daily. ) 60 tablet 1  . isosorbide mononitrate (IMDUR) 60 MG 24 hr tablet Take 1 tablet (60 mg total) by mouth daily. 30 tablet 1  . metoprolol succinate (TOPROL-XL) 50 MG 24 hr tablet Take 1 tablet (50 mg total) by mouth daily. Take with or immediately following a meal. 30 tablet 1  . Multiple Vitamins-Minerals (PRESERVISION AREDS PO) Take 2 tablets by mouth daily.    . pantoprazole (PROTONIX) 40 MG tablet Take 1 tablet (40 mg total) by mouth daily. 30 tablet 0  . polyethylene glycol (MIRALAX / GLYCOLAX) packet Take 17 g by mouth 2 (two) times daily. 14 each 0  . polyethylene glycol (MIRALAX / GLYCOLAX) packet Take 17 g by mouth daily as needed for mild constipation. 14 each 0  . sitaGLIPtin (JANUVIA) 25 MG tablet Take 1 tablet (25 mg total) by mouth daily. 30 tablet 2  . feeding supplement, RESOURCE BREEZE, (RESOURCE BREEZE) LIQD Take 1 Container by mouth 3 (three) times daily between meals. (Patient not taking: Reported on 10/05/2015) 30 Container 0   No current facility-administered medications for this visit.    ROS:   General: No weight loss, Fever, chills  HEENT: No recent headaches, no nasal bleeding, no visual changes, no sore throat  Neurologic: No dizziness, blackouts, seizures. No recent symptoms of stroke or mini- stroke. No recent episodes of slurred speech, or temporary blindness.  Cardiac: No recent episodes of chest pain/pressure, no shortness of breath at rest. + shortness of breath with exertion. Denies history of atrial fibrillation or irregular  heartbeat  Vascular: No history of rest pain in feet. No history of claudication. No history of non-healing ulcer, No history of DVT   Pulmonary: No home oxygen, no productive cough, no hemoptysis, No asthma or wheezing  Musculoskeletal: [ ]  Arthritis, [ ]  Low back pain, [ ]  Joint pain  Hematologic:No history of hypercoagulable state. No history of easy bleeding. No history of anemia  Gastrointestinal: No hematochezia or melena, No gastroesophageal reflux, no trouble swallowing  Urinary: [ ]  chronic Kidney disease, [ ]  on HD - [ ]  MWF or [ ]  TTHS, [ ]  Burning with urination, [ ]  Frequent urination, [ ]  Difficulty urinating;   Skin: No rashes  Psychological: No history of anxiety, No history of depression   Physical Examination   Filed Vitals:   01/25/16 0955  BP: 187/41  Pulse: 65  Temp: 98 F (36.7 C)  TempSrc: Oral  Resp: 20  Height: 5\' 6"  (1.676 m)  Weight: 145 lb (65.772 kg)  SpO2: 97%    Body mass index is 24.55 kg/(m^2).  General: Alert and oriented, no acute distress HEENT: Normal Neck: No bruit or  JVD Pulmonary: Clear to auscultation bilaterally Cardiac: Regular Rate and Rhythm without murmur Gastrointestinal: Soft, non-tender, non-distended, no mass Skin: No rash Extremity Pulses: 2+ radial, brachial pulses bilaterally Musculoskeletal: No deformity or edema Neurologic: Upper and lower extremity motor 5/5 and symmetric  DATA: Duplex ultrasound shows normal brachial artery anatomic pattern with normal artery diameter of 4 mm at the antecubital area , vein mapping shows cephalic vein 3-4 mm in the upper arm small at the forearm bilaterally also small in the left upper arm basilic vein is 4 mm bilaterally from the upper arm   ASSESSMENT: Patient needs long-term hemodialysis access   PLAN: right brachiocephalic AV fistula . Risks benefits possible complications procedure details were discussed with the patient today including but not  limited to bleeding infection ischemic steal non-maturation of the fistula he understands and agrees to proceed.  Fabienne Bruns, MD [facility] [place] Office: [phone] Pager: [phone]

## 2016-01-25 NOTE — Addendum Note (Signed)
Addendum  created 01/25/16 1634 by Fransisca KaufmannMary E Raylee Adamec, CRNA   Modules edited: Anesthesia Medication Administration

## 2016-01-26 ENCOUNTER — Encounter (HOSPITAL_COMMUNITY): Payer: Self-pay | Admitting: Vascular Surgery

## 2016-01-26 ENCOUNTER — Encounter (HOSPITAL_COMMUNITY): Payer: Medicare Other

## 2016-01-27 ENCOUNTER — Other Ambulatory Visit: Payer: Self-pay | Admitting: Nurse Practitioner

## 2016-01-27 ENCOUNTER — Other Ambulatory Visit: Payer: Self-pay | Admitting: Cardiovascular Disease

## 2016-01-31 ENCOUNTER — Other Ambulatory Visit: Payer: Self-pay | Admitting: Nurse Practitioner

## 2016-02-07 ENCOUNTER — Other Ambulatory Visit: Payer: Self-pay | Admitting: Cardiovascular Disease

## 2016-02-07 ENCOUNTER — Encounter (HOSPITAL_COMMUNITY): Payer: Medicare Other

## 2016-02-07 NOTE — Telephone Encounter (Signed)
REFILL 

## 2016-02-09 ENCOUNTER — Encounter (HOSPITAL_COMMUNITY)
Admission: RE | Admit: 2016-02-09 | Discharge: 2016-02-09 | Disposition: A | Discharge status: Home / Self Care | Payer: Medicare Other | Source: Ambulatory Visit | Attending: Nephrology | Admitting: Nephrology

## 2016-02-09 DIAGNOSIS — N183 Chronic kidney disease, stage 3 (moderate): Secondary | ICD-10-CM | POA: Insufficient documentation

## 2016-02-09 DIAGNOSIS — D631 Anemia in chronic kidney disease: Secondary | ICD-10-CM | POA: Insufficient documentation

## 2016-02-09 LAB — IRON AND TIBC
IRON: 105 ug/dL (ref 45–182)
Saturation Ratios: 55 % — ABNORMAL HIGH (ref 17.9–39.5)
TIBC: 192 ug/dL — ABNORMAL LOW (ref 250–450)
UIBC: 87 ug/dL

## 2016-02-09 LAB — FERRITIN: FERRITIN: 385 ng/mL — AB (ref 24–336)

## 2016-02-09 MED ORDER — EPOETIN ALFA 20000 UNIT/ML IJ SOLN: 20000.0000 [IU] | INTRAMUSCULAR | Status: DC

## 2016-02-09 MED ORDER — EPOETIN ALFA 40000 UNIT/ML IJ SOLN
INTRAMUSCULAR | Status: AC
  Administered 2016-02-09: 40000 [IU]
  Filled 2016-02-09: qty 1

## 2016-02-10 LAB — POCT HEMOGLOBIN-HEMACUE: HEMOGLOBIN: 11.6 g/dL — AB (ref 13.0–17.0)

## 2016-02-13 ENCOUNTER — Inpatient Hospital Stay (HOSPITAL_COMMUNITY)
Admission: EM | Admit: 2016-02-13 | Discharge: 2016-02-17 | DRG: 689 | Disposition: A | Discharge status: Home Health | Payer: Medicare Other | Attending: Internal Medicine | Admitting: Internal Medicine

## 2016-02-13 ENCOUNTER — Emergency Department (HOSPITAL_COMMUNITY): Payer: Medicare Other

## 2016-02-13 ENCOUNTER — Encounter (HOSPITAL_COMMUNITY): Payer: Self-pay | Admitting: Emergency Medicine

## 2016-02-13 DIAGNOSIS — E785 Hyperlipidemia, unspecified: Secondary | ICD-10-CM | POA: Diagnosis present

## 2016-02-13 DIAGNOSIS — N186 End stage renal disease: Secondary | ICD-10-CM | POA: Diagnosis present

## 2016-02-13 DIAGNOSIS — E440 Moderate protein-calorie malnutrition: Secondary | ICD-10-CM | POA: Diagnosis present

## 2016-02-13 DIAGNOSIS — E1121 Type 2 diabetes mellitus with diabetic nephropathy: Secondary | ICD-10-CM | POA: Diagnosis present

## 2016-02-13 DIAGNOSIS — Z87891 Personal history of nicotine dependence: Secondary | ICD-10-CM | POA: Diagnosis not present

## 2016-02-13 DIAGNOSIS — H3530 Unspecified macular degeneration: Secondary | ICD-10-CM | POA: Diagnosis present

## 2016-02-13 DIAGNOSIS — E7800 Pure hypercholesterolemia, unspecified: Secondary | ICD-10-CM | POA: Diagnosis present

## 2016-02-13 DIAGNOSIS — N390 Urinary tract infection, site not specified: Principal | ICD-10-CM | POA: Diagnosis present

## 2016-02-13 DIAGNOSIS — F0281 Dementia in other diseases classified elsewhere with behavioral disturbance: Secondary | ICD-10-CM | POA: Diagnosis present

## 2016-02-13 DIAGNOSIS — M109 Gout, unspecified: Secondary | ICD-10-CM | POA: Diagnosis present

## 2016-02-13 DIAGNOSIS — Z7902 Long term (current) use of antithrombotics/antiplatelets: Secondary | ICD-10-CM | POA: Diagnosis not present

## 2016-02-13 DIAGNOSIS — I739 Peripheral vascular disease, unspecified: Secondary | ICD-10-CM

## 2016-02-13 DIAGNOSIS — Z8249 Family history of ischemic heart disease and other diseases of the circulatory system: Secondary | ICD-10-CM

## 2016-02-13 DIAGNOSIS — B9620 Unspecified Escherichia coli [E. coli] as the cause of diseases classified elsewhere: Secondary | ICD-10-CM | POA: Diagnosis present

## 2016-02-13 DIAGNOSIS — N179 Acute kidney failure, unspecified: Secondary | ICD-10-CM | POA: Diagnosis present

## 2016-02-13 DIAGNOSIS — I10 Essential (primary) hypertension: Secondary | ICD-10-CM

## 2016-02-13 DIAGNOSIS — M10329 Gout due to renal impairment, unspecified elbow: Secondary | ICD-10-CM | POA: Diagnosis present

## 2016-02-13 DIAGNOSIS — E1122 Type 2 diabetes mellitus with diabetic chronic kidney disease: Secondary | ICD-10-CM | POA: Diagnosis present

## 2016-02-13 DIAGNOSIS — Z6823 Body mass index (BMI) 23.0-23.9, adult: Secondary | ICD-10-CM | POA: Diagnosis not present

## 2016-02-13 DIAGNOSIS — Z66 Do not resuscitate: Secondary | ICD-10-CM | POA: Diagnosis present

## 2016-02-13 DIAGNOSIS — G9340 Encephalopathy, unspecified: Secondary | ICD-10-CM | POA: Diagnosis present

## 2016-02-13 DIAGNOSIS — G3183 Dementia with Lewy bodies: Secondary | ICD-10-CM | POA: Diagnosis present

## 2016-02-13 DIAGNOSIS — Z823 Family history of stroke: Secondary | ICD-10-CM | POA: Diagnosis not present

## 2016-02-13 DIAGNOSIS — Z7982 Long term (current) use of aspirin: Secondary | ICD-10-CM

## 2016-02-13 DIAGNOSIS — N3000 Acute cystitis without hematuria: Secondary | ICD-10-CM

## 2016-02-13 DIAGNOSIS — I2510 Atherosclerotic heart disease of native coronary artery without angina pectoris: Secondary | ICD-10-CM | POA: Diagnosis present

## 2016-02-13 DIAGNOSIS — I132 Hypertensive heart and chronic kidney disease with heart failure and with stage 5 chronic kidney disease, or end stage renal disease: Secondary | ICD-10-CM | POA: Diagnosis present

## 2016-02-13 DIAGNOSIS — K219 Gastro-esophageal reflux disease without esophagitis: Secondary | ICD-10-CM | POA: Diagnosis present

## 2016-02-13 DIAGNOSIS — R531 Weakness: Secondary | ICD-10-CM

## 2016-02-13 DIAGNOSIS — N185 Chronic kidney disease, stage 5: Secondary | ICD-10-CM

## 2016-02-13 DIAGNOSIS — E861 Hypovolemia: Secondary | ICD-10-CM | POA: Diagnosis present

## 2016-02-13 DIAGNOSIS — I5032 Chronic diastolic (congestive) heart failure: Secondary | ICD-10-CM | POA: Diagnosis present

## 2016-02-13 DIAGNOSIS — E876 Hypokalemia: Secondary | ICD-10-CM | POA: Diagnosis present

## 2016-02-13 DIAGNOSIS — F0280 Dementia in other diseases classified elsewhere without behavioral disturbance: Secondary | ICD-10-CM | POA: Diagnosis present

## 2016-02-13 LAB — URINE MICROSCOPIC-ADD ON

## 2016-02-13 LAB — INFLUENZA PANEL BY PCR (TYPE A & B)
H1N1 flu by pcr: NOT DETECTED
Influenza A By PCR: NEGATIVE
Influenza B By PCR: NEGATIVE

## 2016-02-13 LAB — COMPREHENSIVE METABOLIC PANEL
ALBUMIN: 2.4 g/dL — AB (ref 3.5–5.0)
ALK PHOS: 108 U/L (ref 38–126)
ALT: 13 U/L — AB (ref 17–63)
AST: 22 U/L (ref 15–41)
Anion gap: 16 — ABNORMAL HIGH (ref 5–15)
BUN: 142 mg/dL — AB (ref 6–20)
CALCIUM: 9.3 mg/dL (ref 8.9–10.3)
CHLORIDE: 95 mmol/L — AB (ref 101–111)
CO2: 26 mmol/L (ref 22–32)
CREATININE: 4.62 mg/dL — AB (ref 0.61–1.24)
GFR calc non Af Amer: 11 mL/min — ABNORMAL LOW (ref >60)
GFR, EST AFRICAN AMERICAN: 12 mL/min — AB (ref >60)
Glucose, Bld: 135 mg/dL — ABNORMAL HIGH (ref 65–99)
Potassium: 3.8 mmol/L (ref 3.5–5.1)
SODIUM: 137 mmol/L (ref 135–145)
Total Bilirubin: 0.7 mg/dL (ref 0.3–1.2)
Total Protein: 5.5 g/dL — ABNORMAL LOW (ref 6.5–8.1)

## 2016-02-13 LAB — URINALYSIS, ROUTINE W REFLEX MICROSCOPIC
BILIRUBIN URINE: NEGATIVE
GLUCOSE, UA: NEGATIVE mg/dL
Ketones, ur: NEGATIVE mg/dL
Nitrite: NEGATIVE
PROTEIN: 100 mg/dL — AB
SPECIFIC GRAVITY, URINE: 1.014 (ref 1.005–1.030)
pH: 5.5 (ref 5.0–8.0)

## 2016-02-13 LAB — CBC WITH DIFFERENTIAL/PLATELET
Basophils Absolute: 0 10*3/uL (ref 0.0–0.1)
Basophils Relative: 0 %
EOS ABS: 0 10*3/uL (ref 0.0–0.7)
EOS PCT: 0 %
HCT: 30.7 % — ABNORMAL LOW (ref 39.0–52.0)
Hemoglobin: 10 g/dL — ABNORMAL LOW (ref 13.0–17.0)
LYMPHS ABS: 0.5 10*3/uL — AB (ref 0.7–4.0)
LYMPHS PCT: 5 %
MCH: 29.5 pg (ref 26.0–34.0)
MCHC: 32.6 g/dL (ref 30.0–36.0)
MCV: 90.6 fL (ref 78.0–100.0)
MONO ABS: 0.9 10*3/uL (ref 0.1–1.0)
MONOS PCT: 9 %
Neutro Abs: 8.8 10*3/uL — ABNORMAL HIGH (ref 1.7–7.7)
Neutrophils Relative %: 86 %
PLATELETS: 211 10*3/uL (ref 150–400)
RBC: 3.39 MIL/uL — AB (ref 4.22–5.81)
RDW: 16.6 % — ABNORMAL HIGH (ref 11.5–15.5)
WBC: 10.2 10*3/uL (ref 4.0–10.5)

## 2016-02-13 LAB — I-STAT CG4 LACTIC ACID, ED
Lactic Acid, Venous: 1.11 mmol/L (ref 0.5–2.0)
Lactic Acid, Venous: 0.87 mmol/L (ref 0.5–2.0)

## 2016-02-13 MED ORDER — CLONIDINE HCL 0.1 MG PO TABS: 0.1000 mg | ORAL_TABLET | Freq: Three times a day (TID) | ORAL | Status: DC

## 2016-02-13 MED ORDER — ACETAMINOPHEN 325 MG PO TABS
650.0000 mg | ORAL_TABLET | Freq: Once | ORAL | Status: AC
  Administered 2016-02-13: 650 mg via ORAL
  Filled 2016-02-13: qty 2

## 2016-02-13 MED ORDER — OXYCODONE-ACETAMINOPHEN 5-325 MG PO TABS: 1.0000 | ORAL_TABLET | Freq: Four times a day (QID) | ORAL | Status: DC | PRN

## 2016-02-13 MED ORDER — ACETAMINOPHEN 650 MG RE SUPP
650.0000 mg | Freq: Four times a day (QID) | RECTAL | Status: DC | PRN
Start: 2016-02-13 — End: 2016-02-17

## 2016-02-13 MED ORDER — ATORVASTATIN CALCIUM 80 MG PO TABS
80.0000 mg | ORAL_TABLET | Freq: Every day | ORAL | Status: DC
  Administered 2016-02-14 – 2016-02-17 (×4): 80 mg via ORAL
  Filled 2016-02-13 (×4): qty 1

## 2016-02-13 MED ORDER — ACETAMINOPHEN 325 MG PO TABS
650.0000 mg | ORAL_TABLET | Freq: Four times a day (QID) | ORAL | Status: DC | PRN
Start: 2016-02-13 — End: 2016-02-17

## 2016-02-13 MED ORDER — METOPROLOL SUCCINATE ER 50 MG PO TB24
50.0000 mg | ORAL_TABLET | Freq: Every day | ORAL | Status: DC
  Administered 2016-02-14 – 2016-02-17 (×4): 50 mg via ORAL
  Filled 2016-02-13 (×4): qty 1

## 2016-02-13 MED ORDER — INSULIN ASPART 100 UNIT/ML ~~LOC~~ SOLN
0.0000 [IU] | Freq: Every day | SUBCUTANEOUS | Status: DC
Start: 2016-02-14 — End: 2016-02-17
  Administered 2016-02-16: 2 [IU] via SUBCUTANEOUS

## 2016-02-13 MED ORDER — PRESERVISION AREDS PO CAPS: 1.0000 | ORAL_CAPSULE | Freq: Every day | ORAL | Status: DC

## 2016-02-13 MED ORDER — CEFTRIAXONE SODIUM 1 G IJ SOLR
1.0000 g | INTRAMUSCULAR | Status: DC
Start: 2016-02-14 — End: 2016-02-14

## 2016-02-13 MED ORDER — SODIUM CHLORIDE 0.9% FLUSH
3.0000 mL | Freq: Two times a day (BID) | INTRAVENOUS | Status: DC
  Administered 2016-02-14: 3 mL via INTRAVENOUS

## 2016-02-13 MED ORDER — ENOXAPARIN SODIUM 30 MG/0.3ML ~~LOC~~ SOLN
30.0000 mg | SUBCUTANEOUS | Status: DC
Start: 2016-02-14 — End: 2016-02-14

## 2016-02-13 MED ORDER — INSULIN ASPART 100 UNIT/ML ~~LOC~~ SOLN
0.0000 [IU] | Freq: Three times a day (TID) | SUBCUTANEOUS | Status: DC
  Administered 2016-02-14: 2 [IU] via SUBCUTANEOUS
  Administered 2016-02-14: 1 [IU] via SUBCUTANEOUS
  Administered 2016-02-14: 2 [IU] via SUBCUTANEOUS
  Administered 2016-02-15: 1 [IU] via SUBCUTANEOUS
  Administered 2016-02-15 (×2): 2 [IU] via SUBCUTANEOUS
  Administered 2016-02-16: 1 [IU] via SUBCUTANEOUS
  Administered 2016-02-16 (×2): 2 [IU] via SUBCUTANEOUS
  Administered 2016-02-17: 3 [IU] via SUBCUTANEOUS
  Administered 2016-02-17: 2 [IU] via SUBCUTANEOUS

## 2016-02-13 MED ORDER — CALCITRIOL 0.5 MCG PO CAPS
0.5000 ug | ORAL_CAPSULE | Freq: Every day | ORAL | Status: DC
  Administered 2016-02-14 – 2016-02-17 (×4): 0.5 ug via ORAL
  Filled 2016-02-13 (×4): qty 1

## 2016-02-13 MED ORDER — LANTHANUM CARBONATE 500 MG PO CHEW
1000.0000 mg | CHEWABLE_TABLET | Freq: Three times a day (TID) | ORAL | Status: DC
  Administered 2016-02-14 – 2016-02-17 (×12): 1000 mg via ORAL
  Filled 2016-02-13 (×12): qty 2

## 2016-02-13 MED ORDER — ONDANSETRON HCL 4 MG PO TABS: 4.0000 mg | ORAL_TABLET | Freq: Four times a day (QID) | ORAL | Status: DC | PRN

## 2016-02-13 MED ORDER — DEXTROSE 5 % IV SOLN
1.0000 g | Freq: Once | INTRAVENOUS | Status: AC
  Administered 2016-02-13: 1 g via INTRAVENOUS
  Filled 2016-02-13: qty 10

## 2016-02-13 MED ORDER — FERROUS SULFATE 325 (65 FE) MG PO TABS
325.0000 mg | ORAL_TABLET | Freq: Two times a day (BID) | ORAL | Status: DC
  Administered 2016-02-14 – 2016-02-17 (×8): 325 mg via ORAL
  Filled 2016-02-13 (×9): qty 1

## 2016-02-13 MED ORDER — PANTOPRAZOLE SODIUM 40 MG PO TBEC
40.0000 mg | DELAYED_RELEASE_TABLET | Freq: Every day | ORAL | Status: DC
  Administered 2016-02-14 – 2016-02-17 (×4): 40 mg via ORAL
  Filled 2016-02-13 (×4): qty 1

## 2016-02-13 MED ORDER — AMLODIPINE BESYLATE 5 MG PO TABS
5.0000 mg | ORAL_TABLET | Freq: Every day | ORAL | Status: DC
  Administered 2016-02-14 – 2016-02-17 (×4): 5 mg via ORAL
  Filled 2016-02-13 (×4): qty 1

## 2016-02-13 MED ORDER — CLOPIDOGREL BISULFATE 75 MG PO TABS
75.0000 mg | ORAL_TABLET | Freq: Every day | ORAL | Status: DC
Start: 2016-02-14 — End: 2016-02-17
  Administered 2016-02-14 – 2016-02-17 (×4): 75 mg via ORAL
  Filled 2016-02-13 (×4): qty 1

## 2016-02-13 MED ORDER — SODIUM CHLORIDE 0.9 % IV SOLN
INTRAVENOUS | Status: DC
Start: 2016-02-14 — End: 2016-02-17
  Administered 2016-02-14 – 2016-02-16 (×3): via INTRAVENOUS

## 2016-02-13 MED ORDER — ISOSORBIDE MONONITRATE ER 60 MG PO TB24
60.0000 mg | ORAL_TABLET | Freq: Every day | ORAL | Status: DC
  Administered 2016-02-14 – 2016-02-17 (×4): 60 mg via ORAL
  Filled 2016-02-13 (×4): qty 1

## 2016-02-13 MED ORDER — ALLOPURINOL 100 MG PO TABS
100.0000 mg | ORAL_TABLET | Freq: Every day | ORAL | Status: DC
  Administered 2016-02-14 – 2016-02-17 (×4): 100 mg via ORAL
  Filled 2016-02-13 (×4): qty 1

## 2016-02-13 MED ORDER — ASPIRIN EC 81 MG PO TBEC
81.0000 mg | DELAYED_RELEASE_TABLET | Freq: Every evening | ORAL | Status: DC
Start: 2016-02-14 — End: 2016-02-17
  Administered 2016-02-14 – 2016-02-17 (×4): 81 mg via ORAL
  Filled 2016-02-13 (×4): qty 1

## 2016-02-13 MED ORDER — SODIUM CHLORIDE 0.9 % IV BOLUS (SEPSIS)
500.0000 mL | Freq: Once | INTRAVENOUS | Status: AC
  Administered 2016-02-13: 500 mL via INTRAVENOUS

## 2016-02-13 MED ORDER — ONDANSETRON HCL 4 MG/2ML IJ SOLN: 4.0000 mg | Freq: Four times a day (QID) | INTRAMUSCULAR | Status: DC | PRN

## 2016-02-13 NOTE — ED Provider Notes (Signed)
CSN: [id number]     Arrival date & time 02/13/16  1800 History   First MD Initiated Contact with Patient 02/13/16 1820     Chief Complaint  Patient presents with  . Weakness     (Consider location/radiation/quality/duration/timing/severity/associated sxs/prior Treatment) HPI 82 year old male who presents with weakness and lethargy. History of diabetes, lewy body dementia, hypertension, hyperlipidemia, diastolic HF, and stage V CKD. History is primarily given by patient's daughter who patient lives with at home. She states that over the past 4 days he has primarily been bedbound, very weak and unable to ambulate. He has not been eating or drinking normally. Easily confused, but no falls, no focal numbness or weakness, no focal vision or speech changes. She states that he appeared to have fever yesterday although was not measured. No cough, congestion, runny nose, vomiting, abdominal pain, or complaints of dysuria. Some loose stools yesterday.    . Past Medical History  Diagnosis Date  . Diabetes mellitus   . Hypertension   . Hypercholesteremia   . Gout   . PVD (peripheral vascular disease) (HCC) 7/05    LCE, known 60% RICA 7/12  . CAD (coronary artery disease) 2002    non critical  . Macular degeneration   . Chronic renal disease, stage III   . Claudication (HCC)   . Palpitations     PVCs and PSVT on Holter monitoring  . Bilateral carotid artery disease (HCC)     status post left carotid endarterectomy performed by Dr. Madilyn Fireman 05/12/04  . Anemia    Past Surgical History  Procedure Laterality Date  . 2d echocardiogram  03/31/2008    EF greater than 55%  . Cardiovascular stress test  06/30/2010    Nonischemic. Low risk  . Cerebral angiogram  04/03/2004    High-grade 80% ostial L ICA stenosis. Medical management.  . Abdominal aortogram  09/01/2007    Widely patent renal arteries. Medical management.  . Peripheral vascular angiogram  05/07/2011    No evidence of intracranial  occlusions, stenosis, dissections, or aneurysms seen  . Carotid doppler  06/09/2012    R Vertebral-known occluded vessel, R Bulb/Proximal ICA-moderate to severe amount of plaque w/50-69% diameter reduction, L Subclavian-50-69% diameter reduction, L CEA-demonstrated increased velocities w/o evidence of hemodynamically significant stenosis  . Cardiac catheterization  10/17/2001    No significant CAD, normal LV systolic function.  . Carotid endarterectomy Left July 2005  . Lower extremity angiogram Bilateral 11/01/2014    Procedure: LOWER EXTREMITY ANGIOGRAM;  Surgeon: [name], MD;  Location: [facility] CATH LAB;  Service: Cardiovascular;  Laterality: Bilateral;  . Abdominal angiogram  11/01/2014    Procedure: ABDOMINAL ANGIOGRAM;  Surgeon: [name], MD;  Location: [facility] CATH LAB;  Service: Cardiovascular;;  . Knee surgery    . Hernia repair    . Cardiac catheterization N/A 11/16/2015    Procedure: Left Heart Cath and Coronary Angiography;  Surgeon: Yates Decamp, MD;  Location: [facility] INVASIVE CV LAB;  Service: Cardiovascular;  Laterality: N/A;  . Cardiac catheterization Right 11/16/2015    Procedure: Intravascular Ultrasound/IVUS;  Surgeon: Yates Decamp, MD;  Location: [facility] INVASIVE CV LAB;  Service: Cardiovascular;  Laterality: Right;  . Insertion of dialysis catheter Right 11/23/2015    Procedure: INSERTION OF DIALYSIS CATHETER RIGHT INTERNAL JUGULAR;  Surgeon: Sherren Kerns, MD;  Location: [facility] OR;  Service: Vascular;  Laterality: Right;  . Av fistula placement Right 01/25/2016    Procedure: BRACHIOCEPHALIC ARTERIOVENOUS (AV) FISTULA CREATION;  Surgeon: Sherren Kerns,  MD;  Location: [facility] OR;  Service: Vascular;  Laterality: Right;   Family History  Problem Relation Age of Onset  . Stroke Sister   . Heart disease Brother   . Diabetes Neg Hx    Social History  Substance Use Topics  . Smoking status: Former Smoker -- 1.00 packs/day for 40 years  . Smokeless tobacco: Never Used     Comment: quit  approx. 20 years ago.  . Alcohol Use: No    Review of Systems  Unable to perform ROS: Dementia       Allergies  Labetalol  Home Medications   Prior to Admission medications   Medication Sig Start Date End Date Taking? Authorizing Provider  ACCU-CHEK FASTCLIX LANCETS MISC Use to check blood sugar 2 times per day dx code E11.9 07/20/15   Reather LittlerAjay Kumar, MD  allopurinol (ZYLOPRIM) 100 MG tablet TAKE 1 TABLET (100 MG TOTAL) BY MOUTH AT BEDTIME. 01/27/15   Rhonda G Barrett, PA-C  amLODipine (NORVASC) 10 MG tablet Take 0.5 tablets (5 mg total) by mouth daily. 11/26/15   Elease EtienneAnand D Hongalgi, MD  aspirin EC 81 MG tablet Take 81 mg by mouth every evening.    Historical Provider, MD  atorvastatin (LIPITOR) 80 MG tablet Take 1 tablet (80 mg total) by mouth daily at 6 PM. 09/23/15   Catarina Hartshornavid Tat, MD  calcitRIOL (ROCALTROL) 0.25 MCG capsule Take 2 capsules (0.5 mcg total) by mouth daily. 11/26/15   Elease EtienneAnand D Hongalgi, MD  cloNIDine (CATAPRES) 0.1 MG tablet Take 1 tablet (0.1 mg total) by mouth 3 (three) times daily. Please schedule appointment for refills. 12/19/15   Runell GessJonathan J Berry, MD  clopidogrel (PLAVIX) 75 MG tablet Take 1 tablet (75 mg total) by mouth daily. KEEP OV. 02/07/16   Runell GessJonathan J Berry, MD  ferrous sulfate 325 (65 FE) MG tablet Take 325 mg by mouth 2 (two) times daily with a meal.    Historical Provider, MD  glucose blood (ACCU-CHEK AVIVA PLUS) test strip Use as instructed to check blood sugar 2 times per day dx code E11.9 06/01/15   Reather LittlerAjay Kumar, MD  isosorbide mononitrate (IMDUR) 60 MG 24 hr tablet Take 1 tablet (60 mg total) by mouth daily. 09/23/15   Catarina Hartshornavid Tat, MD  JANUVIA 25 MG tablet TAKE 1 TABLET BY MOUTH DAILY. 12/11/15   Reather LittlerAjay Kumar, MD  lanthanum (FOSRENOL) 1000 MG chewable tablet Chew 1 tablet (1,000 mg total) by mouth 3 (three) times daily with meals. 11/26/15   Elease EtienneAnand D Hongalgi, MD  metoprolol succinate (TOPROL-XL) 50 MG 24 hr tablet Take 1 tablet (50 mg total) by mouth daily. Take with or  immediately following a meal. 09/24/15   Catarina Hartshornavid Tat, MD  Multiple Vitamins-Minerals (PRESERVISION AREDS PO) Take 2 tablets by mouth daily.    Historical Provider, MD  oxyCODONE-acetaminophen (ROXICET) 5-325 MG tablet Take 1 tablet by mouth every 6 (six) hours as needed. 01/25/16   Raymond GurneyKimberly A Trinh, PA-C  pantoprazole (PROTONIX) 40 MG tablet Take 1 tablet (40 mg total) by mouth daily. 03/29/15   Belkys A Regalado, MD   BP 112/49 mmHg  Pulse 64  Temp(Src) 100.6 F (38.1 C) (Rectal)  Resp 19  Ht 5\' 6"  (1.676 m)  Wt 145 lb (65.772 kg)  BMI 23.41 kg/m2  SpO2 92% Physical Exam Physical Exam  Nursing note and vitals reviewed. Constitutional: Thin and chronically ill-appearing, appears fatigued and worn out, non-toxic, and in no acute distress Head: Normocephalic and atraumatic.  Mouth/Throat: Oropharynx is clear and  dry mucous membranes.  Neck: Normal range of motion. Neck supple.  Cardiovascular: Normal rate and regular rhythm.   no lower extremity edema Pulmonary/Chest: Effort normal and breath sounds normal.  Abdominal: Soft. There is no tenderness. There is no rebound and no guarding.  Musculoskeletal: Normal range of motion.  Neurological: Alert, no facial droop, fluent speech, moves all extremities symmetrically Skin: Skin is warm and dry.  dry skin turgor Psychiatric: Cooperative  ED Course  Procedures (including critical care time) Labs Review Labs Reviewed  COMPREHENSIVE METABOLIC PANEL - Abnormal; Notable for the following:    Chloride 95 (*)    Glucose, Bld 135 (*)    BUN 142 (*)    Creatinine, Ser 4.62 (*)    Total Protein 5.5 (*)    Albumin 2.4 (*)    ALT 13 (*)    GFR calc non Af Amer 11 (*)    GFR calc Af Amer 12 (*)    Anion gap 16 (*)    All other components within normal limits  CBC WITH DIFFERENTIAL/PLATELET - Abnormal; Notable for the following:    RBC 3.39 (*)    Hemoglobin 10.0 (*)    HCT 30.7 (*)    RDW 16.6 (*)    Neutro Abs 8.8 (*)    Lymphs Abs 0.5  (*)    All other components within normal limits  URINALYSIS, ROUTINE W REFLEX MICROSCOPIC (NOT AT [facility]) - Abnormal; Notable for the following:    APPearance CLOUDY (*)    Hgb urine dipstick SMALL (*)    Protein, ur 100 (*)    Leukocytes, UA LARGE (*)    All other components within normal limits  URINE MICROSCOPIC-ADD ON - Abnormal; Notable for the following:    Squamous Epithelial / LPF 0-5 (*)    Bacteria, UA MANY (*)    All other components within normal limits  CULTURE, BLOOD (ROUTINE X 2)  CULTURE, BLOOD (ROUTINE X 2)  URINE CULTURE  INFLUENZA PANEL BY PCR (TYPE A & B, H1N1)  I-STAT CG4 LACTIC ACID, ED  I-STAT CG4 LACTIC ACID, ED    Imaging Review Dg Chest 2 View  02/13/2016  CLINICAL DATA:  End-stage renal disease. Diabetes. COPD. Hypertension. Dementia. Lethargy. EXAM: CHEST  2 VIEW COMPARISON:  11/23/2015 FINDINGS: Thoracic spondylosis.  Aortic atherosclerotic calcification. The patient is rotated to the right on today's radiograph, reducing diagnostic sensitivity and specificity. Hazy density at the right lung base, indistinctly marginated. The generalized pulmonary vascular indistinctness shown on the prior exam has improved. No blunting of the costophrenic angles. IMPRESSION: 1. Vague density at the right lung base, indistinctly marginated, possibly from residual edema but technically nonspecific. I recommend followup chest radiography in 4 weeks time to ensure clearance of this process. If the process has not cleared, CT would be recommended for further workup. 2. Generally the interstitial opacity shown on the prior exam has improved. 3. Atherosclerosis. 4. Thoracic spondylosis. Electronically Signed   By: Gaylyn Rong M.D.   On: 02/13/2016 18:51   Ct Head Wo Contrast  02/13/2016  CLINICAL DATA:  Acute presentation with confusion EXAM: CT HEAD WITHOUT CONTRAST TECHNIQUE: Contiguous axial images were obtained from the base of the skull through the vertex without intravenous  contrast. COMPARISON:  None. FINDINGS: The brain shows generalized atrophy. There is advanced chronic appearing small vessel ischemic change throughout the cerebral hemispheric white matter an the pons. No identifiable acute infarction, though an acute insult could be hidden amongst the extensive chronic changes.  No large vessel territory infarction. No mass lesion, hemorrhage, hydrocephalus or extra-axial collection. No calvarial abnormality. Sinuses are clear except for a retention cyst in the right maxillary sinus. There is atherosclerotic calcification of the major vessels at the base of the brain. IMPRESSION: No acute finding by CT. Advanced atrophy and chronic small vessel ischemic changes throughout the brain. Certainly, a small acute insult could be hidden amongst the extensive chronic changes. Electronically Signed   By: Paulina Fusi M.D.   On: 02/13/2016 20:31   I have personally reviewed and evaluated these images and lab results as part of my medical decision-making.   EKG Interpretation   Date/Time:  Monday February 13 2016 18:12:18 EDT Ventricular Rate:  72 PR Interval:  171 QRS Duration: 80 QT Interval:  437 QTC Calculation: 478 R Axis:   80 Text Interpretation:  Sinus rhythm Ventricular premature complex Left  atrial enlargement LVH with secondary repolarization abnormality  Borderline prolonged QT interval No significant change since last tracing  Confirmed by Navid Lenzen MD, Arlesia Kiel ([id number]) on 02/13/2016 6:25:56 PM      MDM   Final diagnoses:  Generalized weakness  CKD (chronic kidney disease), stage V (HCC)  Acute cystitis without hematuria    82 year old male who presents with 4 days of progressive generalized weakness and lethargy. On presentation he is febrile to 100.6 Fahrenheit. Is normotensive and without tachycardia. Breathing comfortably on room air. Is chronically ill-appearing and appears dry on exam. No other focal findings on exam. Infectious workup is pursued and he is  has evidence of a urinary tract infection. Urine is sent for culture. Blood cultures also performed. He has a normal lactic acid and no leukocytosis. Gradually worsening renal function with a creatinine of about 4.6 and a BUN of 100. This also may be playing a role in his presentation today. He is given IV fluids and ceftriaxone. Discussed with Dr. Clyde Lundborg who will admit for ongoing management.   Lavera Guise, MD 02/13/16 [phone]

## 2016-02-13 NOTE — ED Notes (Signed)
85 yom PMHx ESRD, DM, HTN, COPD, dementia presents via EMS from home. Per EMS son states he has been more confused and lethargic over the past 2-3 days. No appetite. He has been unable to get out of bed.  S/p AV fistula RUE 3 weeks ago.    Glucose 168mg /dl

## 2016-02-13 NOTE — H&P (Addendum)
[facility] History and Physical  [name] BJY:[id number] DOB: May 01, 1930 DOA: 02/13/2016  Referring physician: ED physician PCP: Lupita Raider, MD  Specialists:   Chief Complaint: Altered mental status, fever and generalized weakness  HPI: [name] is a 82 y.o. male with PMH of ESRD (s/p of AVF 01/25/16, not started dialysis yet), hypertension, hyperlipidemia, diabetes mellitus, GERD, gout, Lewy body dementia, PVD, CAD, bilateral carotid artery stenosis (S/P of left carotid artery endarterectomy), anemia, diastolic HCF, who presents with altered mental status, fever and generalized weakness.  Per pt's son, patient's mental status has been worsening than his baseline in the past 3-4 days. He is more confused. He has generalized weakness, decreased appetite and oral intake. He also has fever and chills. He denies symptoms of UTI. He had loose stool yesterday, but did not have bowel movement today. Patient does not have cough, chest pain or SOB. He moves all extremities, does not seem to have unilateral tingling or weakness per his son. No nausea, vomiting or abdomional pain.  In ED, patient was found to have positive urinalysis with large amount of leukocytes, lactate 1.11-->0.87, transient oxygen desaturation to 88 which improved to 97% on room air. WBC 10.2, temperature 100.6, bradycardia, worsening renal function. Negative CT scan for acute intracranial abnormalities. Chest x-ray showed vague density at the right lung base, indistinctly marginated, possibly from residual edema but technically nonspecific; fenerally the interstitial opacity shown on the prior exam has improved. Patient is admitted to inpatient for further reevaluation and treatment.  EKG: Independently reviewed. QTC 478, ST depression in inferior leads and V5-V6, which is similar to previous EKG on 01/25/16.  Where does patient live?   At home   Can patient participate in ADLs?   None   Review of Systems: Could not be  reviewed accurately due to dementia.  Allergy:  Allergies  Allergen Reactions  . Labetalol Swelling    angioedema    Past Medical History  Diagnosis Date  . Diabetes mellitus   . Hypertension   . Hypercholesteremia   . Gout   . PVD (peripheral vascular disease) (HCC) 7/05    LCE, known 60% RICA 7/12  . CAD (coronary artery disease) 2002    non critical  . Macular degeneration   . Chronic renal disease, stage III   . Claudication (HCC)   . Palpitations     PVCs and PSVT on Holter monitoring  . Bilateral carotid artery disease (HCC)     status post left carotid endarterectomy performed by Dr. Madilyn Fireman 05/12/04  . Anemia     Past Surgical History  Procedure Laterality Date  . 2d echocardiogram  03/31/2008    EF greater than 55%  . Cardiovascular stress test  06/30/2010    Nonischemic. Low risk  . Cerebral angiogram  04/03/2004    High-grade 80% ostial L ICA stenosis. Medical management.  . Abdominal aortogram  09/01/2007    Widely patent renal arteries. Medical management.  . Peripheral vascular angiogram  05/07/2011    No evidence of intracranial occlusions, stenosis, dissections, or aneurysms seen  . Carotid doppler  06/09/2012    R Vertebral-known occluded vessel, R Bulb/Proximal ICA-moderate to severe amount of plaque w/50-69% diameter reduction, L Subclavian-50-69% diameter reduction, L CEA-demonstrated increased velocities w/o evidence of hemodynamically significant stenosis  . Cardiac catheterization  10/17/2001    No significant CAD, normal LV systolic function.  . Carotid endarterectomy Left July 2005  . Lower extremity angiogram Bilateral 11/01/2014    Procedure: LOWER  EXTREMITY ANGIOGRAM;  Surgeon: [name], MD;  Location: [facility] CATH LAB;  Service: Cardiovascular;  Laterality: Bilateral;  . Abdominal angiogram  11/01/2014    Procedure: ABDOMINAL ANGIOGRAM;  Surgeon: [name], MD;  Location: [facility] CATH LAB;  Service: Cardiovascular;;  . Knee surgery    . Hernia  repair    . Cardiac catheterization N/A 11/16/2015    Procedure: Left Heart Cath and Coronary Angiography;  Surgeon: Yates Decamp, MD;  Location: [facility] INVASIVE CV LAB;  Service: Cardiovascular;  Laterality: N/A;  . Cardiac catheterization Right 11/16/2015    Procedure: Intravascular Ultrasound/IVUS;  Surgeon: Yates Decamp, MD;  Location: [facility] INVASIVE CV LAB;  Service: Cardiovascular;  Laterality: Right;  . Insertion of dialysis catheter Right 11/23/2015    Procedure: INSERTION OF DIALYSIS CATHETER RIGHT INTERNAL JUGULAR;  Surgeon: Sherren Kerns, MD;  Location: [facility] OR;  Service: Vascular;  Laterality: Right;  . Av fistula placement Right 01/25/2016    Procedure: BRACHIOCEPHALIC ARTERIOVENOUS (AV) FISTULA CREATION;  Surgeon: Sherren Kerns, MD;  Location: [facility] OR;  Service: Vascular;  Laterality: Right;    Social History:  reports that he has quit smoking. He has never used smokeless tobacco. He reports that he does not drink alcohol or use illicit drugs.  Family History:  Family History  Problem Relation Age of Onset  . Stroke Sister   . Heart disease Brother   . Diabetes Neg Hx      Prior to Admission medications   Medication Sig Start Date End Date Taking? Authorizing Provider  ACCU-CHEK FASTCLIX LANCETS MISC Use to check blood sugar 2 times per day dx code E11.9 07/20/15   Reather Littler, MD  allopurinol (ZYLOPRIM) 100 MG tablet TAKE 1 TABLET (100 MG TOTAL) BY MOUTH AT BEDTIME. 01/27/15   Rhonda G Barrett, PA-C  amLODipine (NORVASC) 10 MG tablet Take 0.5 tablets (5 mg total) by mouth daily. 11/26/15   Elease Etienne, MD  aspirin EC 81 MG tablet Take 81 mg by mouth every evening.    Historical Provider, MD  atorvastatin (LIPITOR) 80 MG tablet Take 1 tablet (80 mg total) by mouth daily at 6 PM. 09/23/15   Catarina Hartshorn, MD  calcitRIOL (ROCALTROL) 0.25 MCG capsule Take 2 capsules (0.5 mcg total) by mouth daily. 11/26/15   Elease Etienne, MD  cloNIDine (CATAPRES) 0.1 MG tablet Take 1 tablet (0.1 mg total) by  mouth 3 (three) times daily. Please schedule appointment for refills. 12/19/15   [name], MD  clopidogrel (PLAVIX) 75 MG tablet Take 1 tablet (75 mg total) by mouth daily. KEEP OV. 02/07/16   [name], MD  ferrous sulfate 325 (65 FE) MG tablet Take 325 mg by mouth 2 (two) times daily with a meal.    Historical Provider, MD  glucose blood (ACCU-CHEK AVIVA PLUS) test strip Use as instructed to check blood sugar 2 times per day dx code E11.9 06/01/15   Reather Littler, MD  isosorbide mononitrate (IMDUR) 60 MG 24 hr tablet Take 1 tablet (60 mg total) by mouth daily. 09/23/15   Catarina Hartshorn, MD  JANUVIA 25 MG tablet TAKE 1 TABLET BY MOUTH DAILY. 12/11/15   Reather Littler, MD  lanthanum (FOSRENOL) 1000 MG chewable tablet Chew 1 tablet (1,000 mg total) by mouth 3 (three) times daily with meals. 11/26/15   Elease Etienne, MD  metoprolol succinate (TOPROL-XL) 50 MG 24 hr tablet Take 1 tablet (50 mg total) by mouth daily. Take with or immediately following a  meal. 09/24/15   Catarina Hartshorn, MD  Multiple Vitamins-Minerals (PRESERVISION AREDS PO) Take 2 tablets by mouth daily.    Historical Provider, MD  oxyCODONE-acetaminophen (ROXICET) 5-325 MG tablet Take 1 tablet by mouth every 6 (six) hours as needed. 01/25/16   Raymond Gurney, PA-C  pantoprazole (PROTONIX) 40 MG tablet Take 1 tablet (40 mg total) by mouth daily. 03/29/15   Alba Cory, MD    Physical Exam: Filed Vitals:   02/13/16 2245 02/13/16 2345 02/14/16 0015 02/14/16 0153  BP: 108/93 115/51 116/49 147/53  Pulse: 66 67 61 65  Temp:    97.7 F (36.5 C)  TempSrc:    Oral  Resp:    18  Height:      Weight:      SpO2: 99% 95% 95% 100%   General: Not in acute distress HEENT:       Eyes: PERRL, EOMI, no scleral icterus.       ENT: No discharge from the ears and nose, no pharynx injection, no tonsillar enlargement.        Neck: No JVD, no bruit, no mass felt. Heme: No neck lymph node enlargement. Cardiac: S1/S2, RRR, No murmurs, No gallops  or rubs. Pulm: No rales, wheezing, rhonchi or rubs. Abd: Soft, nondistended, nontender, no rebound pain, no organomegaly, BS present. Ext: No pitting leg edema bilaterally. 2+DP/PT pulse bilaterally. Has functioning AVF over right arm. Musculoskeletal: No joint deformities, No joint redness or warmth, no limitation of ROM in spin. Skin: No rashes.  Neuro: Alert, oriented place and know his son, but not oriented to time. Cranial nerves II-XII grossly intact, moves all extremities normally. Psych: Patient is not psychotic, no suicidal or hemocidal ideation.  Labs on Admission:  Basic Metabolic Panel:  Recent Labs Lab 02/13/16 1815 02/14/16 0115  NA 137 136  K 3.8 3.4*  CL 95* 98*  CO2 26 24  GLUCOSE 135* 189*  BUN 142* 136*  CREATININE 4.62* 4.35*  CALCIUM 9.3 8.9   Liver Function Tests:  Recent Labs Lab 02/13/16 1815 02/14/16 0115  AST 22 21  ALT 13* 12*  ALKPHOS 108 94  BILITOT 0.7 0.3  PROT 5.5* 4.8*  ALBUMIN 2.4* 2.1*   No results for input(s): LIPASE, AMYLASE in the last 168 hours. No results for input(s): AMMONIA in the last 168 hours. CBC:  Recent Labs Lab 02/09/16 0850 02/13/16 1815 02/14/16 0115  WBC  --  10.2 8.7  NEUTROABS  --  8.8*  --   HGB 11.6* 10.0* 8.8*  HCT  --  30.7* 28.3*  MCV  --  90.6 90.7  PLT  --  211 194   Cardiac Enzymes: No results for input(s): CKTOTAL, CKMB, CKMBINDEX, TROPONINI in the last 168 hours.  BNP (last 3 results)  Recent Labs  09/11/15 0955 11/16/15 1015 02/14/16 0115  BNP 2200.4* 2120.5* 331.7*    ProBNP (last 3 results) No results for input(s): PROBNP in the last 8760 hours.  CBG:  Recent Labs Lab 02/14/16 0149  GLUCAP 200*    Radiological Exams on Admission: Dg Chest 2 View  02/13/2016  CLINICAL DATA:  End-stage renal disease. Diabetes. COPD. Hypertension. Dementia. Lethargy. EXAM: CHEST  2 VIEW COMPARISON:  11/23/2015 FINDINGS: Thoracic spondylosis.  Aortic atherosclerotic calcification. The  patient is rotated to the right on today's radiograph, reducing diagnostic sensitivity and specificity. Hazy density at the right lung base, indistinctly marginated. The generalized pulmonary vascular indistinctness shown on the prior exam has improved. No blunting of  the costophrenic angles. IMPRESSION: 1. Vague density at the right lung base, indistinctly marginated, possibly from residual edema but technically nonspecific. I recommend followup chest radiography in 4 weeks time to ensure clearance of this process. If the process has not cleared, CT would be recommended for further workup. 2. Generally the interstitial opacity shown on the prior exam has improved. 3. Atherosclerosis. 4. Thoracic spondylosis. Electronically Signed   By: Gaylyn Rong M.D.   On: 02/13/2016 18:51   Ct Head Wo Contrast  02/13/2016  CLINICAL DATA:  Acute presentation with confusion EXAM: CT HEAD WITHOUT CONTRAST TECHNIQUE: Contiguous axial images were obtained from the base of the skull through the vertex without intravenous contrast. COMPARISON:  None. FINDINGS: The brain shows generalized atrophy. There is advanced chronic appearing small vessel ischemic change throughout the cerebral hemispheric white matter an the pons. No identifiable acute infarction, though an acute insult could be hidden amongst the extensive chronic changes. No large vessel territory infarction. No mass lesion, hemorrhage, hydrocephalus or extra-axial collection. No calvarial abnormality. Sinuses are clear except for a retention cyst in the right maxillary sinus. There is atherosclerotic calcification of the major vessels at the base of the brain. IMPRESSION: No acute finding by CT. Advanced atrophy and chronic small vessel ischemic changes throughout the brain. Certainly, a small acute insult could be hidden amongst the extensive chronic changes. Electronically Signed   By: Paulina Fusi M.D.   On: 02/13/2016 20:31    Assessment/Plan Principal  Problem:   UTI (lower urinary tract infection) Active Problems:   Peripheral arterial disease (HCC)   Claudication in peripheral vascular disease (HCC)   DNR (do not resuscitate)   ESRD (end stage renal disease) (HCC)   Essential hypertension   HLD (hyperlipidemia)   Gout of elbow due to renal impairment   Controlled type 2 diabetes mellitus with diabetic nephropathy (HCC)   Lewy body dementia without behavioral disturbance   Chronic diastolic CHF (congestive heart failure) (HCC)   Acute encephalopathy   Hypokalemia   UTI (lower urinary tract infection): Patient has positive urinalysis with large amount of leukocytes. He denies symptoms of UTI, but he has dementia, may not be able to tell the symptoms. Given patient's worsening mental status, it would be appropriate to treat possible UTI with antibiotics. Patient is not septic. Lactate is normal. Hemodynamically stable.  -will admit to tel bed (due to hx of CHF) -IV rocephine -f/u Bx and Ux  Acute encephalopathy:  CT-head is negative. Likely due to UTI. No focal neurological findings. -Treat UTI as above -Frequent neuro check  ESRD (end stage renal disease) (HCC): s/p of AVF. Not started HD yet. Potassium is 3.8, bicarbonate 26, creatinine 4.6 to an BUN 142. -may consult renal in AM. -continue Fosrenol and alciriol  HTN: -continue amlodipine, clonidine, metoprolol  HLD: Last LDL was 48 on 09/23/15 -Continue home medications: Lipitor  Gout of elbow due to renal impairment: stable -continue allopurinol  DM-II: Last A1c 6.1 on 09/22/15, well controled. Patient is taking Januvia at home -SSI  Chronic diastolic CHF (congestive heart failure) (HCC): 2-D echo 11/16/15 showed EF of 50-55 percent with grade 2 diastolic dysfunction. Patient is not taking diuretics at home. No leg edema. CHF is compensated. -Continue metoprolol and ASA -Check BNP    CAD: no CP. -continue ASA, lipitor and Imdur  Hx of bilateral carotid artery  stenosis: s/p of left carotid artery endarterectomy. -On Plavix, Lipitor and aspirin   DVT ppx: Sq heparin (Pt has ESRD, sq  heparin is better choice than sq Lovenox)   Code Status: DNR Family Communication: Yes, patient's sons at bed side Disposition Plan: Admit to inpatient   Date of Service 02/14/2016    Lorretta HarpIU, Hensley Aziz [facility] Pager [phone]  If 7PM-7AM, please contact night-coverage [email or web address] Password [email or web address] 02/14/2016, 6:09 AM

## 2016-02-14 DIAGNOSIS — N390 Urinary tract infection, site not specified: Principal | ICD-10-CM

## 2016-02-14 DIAGNOSIS — G9340 Encephalopathy, unspecified: Secondary | ICD-10-CM

## 2016-02-14 DIAGNOSIS — E440 Moderate protein-calorie malnutrition: Secondary | ICD-10-CM | POA: Insufficient documentation

## 2016-02-14 DIAGNOSIS — N186 End stage renal disease: Secondary | ICD-10-CM

## 2016-02-14 DIAGNOSIS — G3183 Dementia with Lewy bodies: Secondary | ICD-10-CM

## 2016-02-14 DIAGNOSIS — F0280 Dementia in other diseases classified elsewhere without behavioral disturbance: Secondary | ICD-10-CM

## 2016-02-14 LAB — GLUCOSE, CAPILLARY
GLUCOSE-CAPILLARY: 177 mg/dL — AB (ref 65–99)
Glucose-Capillary: 200 mg/dL — ABNORMAL HIGH (ref 65–99)
Glucose-Capillary: 149 mg/dL — ABNORMAL HIGH (ref 65–99)
Glucose-Capillary: 187 mg/dL — ABNORMAL HIGH (ref 65–99)
Glucose-Capillary: 167 mg/dL — ABNORMAL HIGH (ref 65–99)

## 2016-02-14 LAB — COMPREHENSIVE METABOLIC PANEL
ALT: 12 U/L — AB (ref 17–63)
AST: 21 U/L (ref 15–41)
Albumin: 2.1 g/dL — ABNORMAL LOW (ref 3.5–5.0)
Alkaline Phosphatase: 94 U/L (ref 38–126)
Anion gap: 14 (ref 5–15)
BILIRUBIN TOTAL: 0.3 mg/dL (ref 0.3–1.2)
BUN: 136 mg/dL — AB (ref 6–20)
CHLORIDE: 98 mmol/L — AB (ref 101–111)
CO2: 24 mmol/L (ref 22–32)
CREATININE: 4.35 mg/dL — AB (ref 0.61–1.24)
Calcium: 8.9 mg/dL (ref 8.9–10.3)
GFR, EST AFRICAN AMERICAN: 13 mL/min — AB (ref >60)
GFR, EST NON AFRICAN AMERICAN: 11 mL/min — AB (ref >60)
Glucose, Bld: 189 mg/dL — ABNORMAL HIGH (ref 65–99)
POTASSIUM: 3.4 mmol/L — AB (ref 3.5–5.1)
Sodium: 136 mmol/L (ref 135–145)
TOTAL PROTEIN: 4.8 g/dL — AB (ref 6.5–8.1)

## 2016-02-14 LAB — CBC
HCT: 28.3 % — ABNORMAL LOW (ref 39.0–52.0)
Hemoglobin: 8.8 g/dL — ABNORMAL LOW (ref 13.0–17.0)
MCH: 28.2 pg (ref 26.0–34.0)
MCHC: 31.1 g/dL (ref 30.0–36.0)
MCV: 90.7 fL (ref 78.0–100.0)
Platelets: 194 10*3/uL (ref 150–400)
RBC: 3.12 MIL/uL — AB (ref 4.22–5.81)
RDW: 16.5 % — AB (ref 11.5–15.5)
WBC: 8.7 10*3/uL (ref 4.0–10.5)

## 2016-02-14 LAB — PROTIME-INR
INR: 1.05 (ref 0.00–1.49)
PROTHROMBIN TIME: 13.9 s (ref 11.6–15.2)

## 2016-02-14 LAB — BRAIN NATRIURETIC PEPTIDE: B NATRIURETIC PEPTIDE 5: 331.7 pg/mL — AB (ref 0.0–100.0)

## 2016-02-14 MED ORDER — CLONIDINE HCL 0.1 MG PO TABS
0.1000 mg | ORAL_TABLET | Freq: Three times a day (TID) | ORAL | Status: DC
  Administered 2016-02-14 – 2016-02-17 (×11): 0.1 mg via ORAL
  Filled 2016-02-14 (×11): qty 1

## 2016-02-14 MED ORDER — PROSIGHT PO TABS
1.0000 | ORAL_TABLET | Freq: Every day | ORAL | Status: DC
  Administered 2016-02-14 – 2016-02-17 (×4): 1 via ORAL
  Filled 2016-02-14 (×4): qty 1

## 2016-02-14 MED ORDER — DEXTROSE 5 % IV SOLN
1.0000 g | INTRAVENOUS | Status: DC
  Administered 2016-02-14: 1 g via INTRAVENOUS
  Filled 2016-02-14 (×2): qty 10

## 2016-02-14 MED ORDER — NEPRO/CARBSTEADY PO LIQD
237.0000 mL | Freq: Two times a day (BID) | ORAL | Status: DC
  Administered 2016-02-14 – 2016-02-17 (×4): 237 mL via ORAL

## 2016-02-14 MED ORDER — HEPARIN SODIUM (PORCINE) 5000 UNIT/ML IJ SOLN
5000.0000 [IU] | Freq: Three times a day (TID) | INTRAMUSCULAR | Status: DC
  Administered 2016-02-14 – 2016-02-16 (×8): 5000 [IU] via SUBCUTANEOUS
  Filled 2016-02-14 (×8): qty 1

## 2016-02-14 NOTE — Progress Notes (Signed)
PROGRESS NOTE  Pasty ArchRoy C Voisin FAO:[id number] DOB: 03/29/1930 DOA: 02/13/2016 PCP: [name],[name], MD  HPI/Recap of past 3924 hours: 82 year old male with past medical history of dementia, stage V chronic kidney disease not yet on dialysis although has fistula placed came in with confusion felt to be secondary to UTI as well as worsening renal failure. Patient started on IV fluids and antibiotics.  Today, according to daughter, patient more alert although not yet back at baseline. Creatinine slightly improved, although not yet back to baseline. Patient himself with no complaints.  Assessment/Plan: Principal Problem:   UTI (lower urinary tract infection): Awaiting cultures, continue antibiotics Active Problems:   Peripheral arterial disease (HCC)   Claudication in peripheral vascular disease (HCC)   DNR (do not resuscitate): In discussion with patient's daughter, requesting palliative care given overall issues with dementia and renal disease   ESRD (end stage renal disease) (HCC)   Essential hypertension: Blood pressure improved with IV fluids   HLD (hyperlipidemia)   Gout of elbow due to renal impairment   Controlled type 2 diabetes mellitus with diabetic nephropathy (HCC): Blood sugar stable   Lewy body dementia without behavioral disturbance/ Acute encephalopathy secondary to UTI and worsening renal failure   Chronic diastolic CHF (congestive heart failure) (HCC): Slightly hypovolemic    Malnutrition of moderate degree: Patient meets criteria in the context of chronic illness. Seen by nutrition. Started on Nepro shake twice a day   Code Status: DO NOT RESUSCITATE  Family Communication: Daughter at the bedside   Disposition Plan:  Discharge tomorrow or Thursday after seen by palliative care and depending on renal function   Consultants:  Palliative care   Procedures:  None   Antibiotics:  IV Rocephin 4 /17-present  Objective: BP 135/49 mmHg  Pulse 56  Temp(Src) 97.3 F  (36.3 C) (Oral)  Resp 16  Ht 5\' 6"  (1.676 m)  Wt 65.772 kg (145 lb)  BMI 23.41 kg/m2  SpO2 100%  Intake/Output Summary (Last 24 hours) at 02/14/16 1927 Last data filed at 02/14/16 1832  Gross per 24 hour  Intake   2040 ml  Output    325 ml  Net   1715 ml   Filed Weights   02/13/16 1805  Weight: 65.772 kg (145 lb)    Exam:   General:  Alert and oriented 2   Cardiovascular: regular rate and rhythm, S1-S2, borderline bradycardia   Respiratory: clear to auscultation bilaterally   Abdomen: soft, nontender, nondistended, positive bowel sounds   Musculoskeletal: fistula noted, no edema    Data Reviewed: Basic Metabolic Panel:  Recent Labs Lab 02/13/16 1815 02/14/16 0115  NA 137 136  K 3.8 3.4*  CL 95* 98*  CO2 26 24  GLUCOSE 135* 189*  BUN 142* 136*  CREATININE 4.62* 4.35*  CALCIUM 9.3 8.9   Liver Function Tests:  Recent Labs Lab 02/13/16 1815 02/14/16 0115  AST 22 21  ALT 13* 12*  ALKPHOS 108 94  BILITOT 0.7 0.3  PROT 5.5* 4.8*  ALBUMIN 2.4* 2.1*   No results for input(s): LIPASE, AMYLASE in the last 168 hours. No results for input(s): AMMONIA in the last 168 hours. CBC:  Recent Labs Lab 02/09/16 0850 02/13/16 1815 02/14/16 0115  WBC  --  10.2 8.7  NEUTROABS  --  8.8*  --   HGB 11.6* 10.0* 8.8*  HCT  --  30.7* 28.3*  MCV  --  90.6 90.7  PLT  --  211 194   Cardiac Enzymes:  No results for input(s): CKTOTAL, CKMB, CKMBINDEX, TROPONINI in the last 168 hours. BNP (last 3 results)  Recent Labs  09/11/15 0955 11/16/15 1015 02/14/16 0115  BNP 2200.4* 2120.5* 331.7*    ProBNP (last 3 results) No results for input(s): PROBNP in the last 8760 hours.  CBG:  Recent Labs Lab 02/14/16 0149 02/14/16 0754 02/14/16 1211 02/14/16 1703  GLUCAP 200* 149* 187* 177*    Recent Results (from the past 240 hour(s))  Urine culture     Status: Abnormal (Preliminary result)   Collection Time: 02/13/16  8:35 PM  Result Value Ref Range Status    Specimen Description URINE, RANDOM  Final   Special Requests NONE  Final   Culture >=100,000 COLONIES/mL GRAM NEGATIVE RODS (A)  Final   Report Status PENDING  Incomplete  Culture, blood (Routine x 2)     Status: None (Preliminary result)   Collection Time: 02/13/16  8:45 PM  Result Value Ref Range Status   Specimen Description BLOOD LEFT HAND  Final   Special Requests BOTTLES DRAWN AEROBIC ONLY 5CC  Final   Culture NO GROWTH < 24 HOURS  Final   Report Status PENDING  Incomplete  Culture, blood (Routine x 2)     Status: None (Preliminary result)   Collection Time: 02/13/16  8:50 PM  Result Value Ref Range Status   Specimen Description BLOOD LEFT ARM  Final   Special Requests BOTTLES DRAWN AEROBIC ONLY 5CC  Final   Culture NO GROWTH < 24 HOURS  Final   Report Status PENDING  Incomplete     Studies: Ct Head Wo Contrast  02/13/2016  CLINICAL DATA:  Acute presentation with confusion EXAM: CT HEAD WITHOUT CONTRAST TECHNIQUE: Contiguous axial images were obtained from the base of the skull through the vertex without intravenous contrast. COMPARISON:  None. FINDINGS: The brain shows generalized atrophy. There is advanced chronic appearing small vessel ischemic change throughout the cerebral hemispheric white matter an the pons. No identifiable acute infarction, though an acute insult could be hidden amongst the extensive chronic changes. No large vessel territory infarction. No mass lesion, hemorrhage, hydrocephalus or extra-axial collection. No calvarial abnormality. Sinuses are clear except for a retention cyst in the right maxillary sinus. There is atherosclerotic calcification of the major vessels at the base of the brain. IMPRESSION: No acute finding by CT. Advanced atrophy and chronic small vessel ischemic changes throughout the brain. Certainly, a small acute insult could be hidden amongst the extensive chronic changes. Electronically Signed   By: Paulina Fusi M.D.   On: 02/13/2016 20:31     Scheduled Meds: . allopurinol  100 mg Oral Daily  . amLODipine  5 mg Oral Daily  . aspirin EC  81 mg Oral QPM  . atorvastatin  80 mg Oral q1800  . calcitRIOL  0.5 mcg Oral Daily  . cefTRIAXone (ROCEPHIN)  IV  1 g Intravenous Q24H  . cloNIDine  0.1 mg Oral TID  . clopidogrel  75 mg Oral Daily  . feeding supplement (NEPRO CARB STEADY)  237 mL Oral BID BM  . ferrous sulfate  325 mg Oral BID WC  . heparin subcutaneous  5,000 Units Subcutaneous 3 times per day  . insulin aspart  0-5 Units Subcutaneous QHS  . insulin aspart  0-9 Units Subcutaneous TID WC  . isosorbide mononitrate  60 mg Oral Daily  . lanthanum  1,000 mg Oral TID WC  . metoprolol succinate  50 mg Oral Daily  . multivitamin  1 tablet  Oral Daily  . pantoprazole  40 mg Oral Daily  . sodium chloride flush  3 mL Intravenous Q12H    Continuous Infusions: . sodium chloride 75 mL/hr at 02/14/16 0144     Time spent: 15 minutes   Hollice Espy  [facility] Pager [phone] . If 7PM-7AM, please contact night-coverage at [email or web address], password [facility] 02/14/2016, 7:27 PM  LOS: 1 day

## 2016-02-14 NOTE — Progress Notes (Signed)
OT Cancellation Note  Patient Details Name: [name] MRN: [id number] DOB: 04/28/1930   Cancelled Treatment:    Reason Eval/Treat Not Completed: Fatigue/lethargy limiting ability to participate (Will attempt in am.)  [facility] 2WARD,HILLARY  Malvika Tung, OTR/L  [phone] 02/14/2016 02/14/2016, 4:32 PM

## 2016-02-14 NOTE — Progress Notes (Signed)
New Admission Note:  Arrival Method: via stretcher with ED RN Mental Orientation: Alert & oriented x 1-2 Telemetry: box 18, CCMD notified Assessment: Completed Skin: see doc flowsheet IV: left AC  Pain: denies pain Tubes: n/a Safety Measures: Safety Fall Prevention Plan discussed with patient and family Admission: Completed [address] Orientation: Patient has been orientated to the room, unit and the staff. Family: at bedside  Orders have been reviewed and implemented. Will continue to monitor the patient. Call light has been placed within reach and bed alarm has been activated.   Tempie DonningMercy Shyanna Klingel BSN, RN  Phone Number: [phone] [facility] [place]

## 2016-02-14 NOTE — Evaluation (Signed)
Physical Therapy Evaluation Patient Details Name: [name] MRN: [id number] DOB: 05/04/1930 Today's Date: 02/14/2016   History of Present Illness  [name] is a 82 y.o. male with PMH of ESRD (s/p of AVF 01/25/16, not started dialysis yet), hypertension, hyperlipidemia, diabetes mellitus, GERD, gout, Lewy body dementia, PVD, CAD, bilateral carotid artery stenosis (S/P of left carotid artery endarterectomy), anemia, diastolic HCF, who presents with altered mental status, fever and generalized weakness.  Clinical Impression   Pt admitted with above diagnosis. Pt currently with functional limitations due to the deficits listed below (see PT Problem List).  Pt will benefit from skilled PT to increase their independence and safety with mobility to allow discharge to the venue listed below.    Pt was independent prior to this illness, and it is likely that as he improves medically he will make good functional gains, and be able to dc home; Will monitor for progress, and if he is slow to improve, will update recs to SNF for post-acute rehab     Follow Up Recommendations Home health PT;Supervision - Intermittent    Equipment Recommendations  Rolling walker with 5" wheels;3in1 (PT) (May already have)    Recommendations for Other Services OT consult     Precautions / Restrictions Precautions Precautions: Fall      Mobility  Bed Mobility Overal bed mobility: Needs Assistance Bed Mobility: Supine to Sit     Supine to sit: Min assist     General bed mobility comments: Min handheld assist to pull to sit  Transfers Overall transfer level: Needs assistance Equipment used: None Transfers: Sit to/from Stand Sit to Stand: Min guard         General transfer comment: close guard to steady; initially unsteady and reaching out for UE support  Ambulation/Gait Ambulation/Gait assistance: Min assist;Min guard Ambulation Distance (Feet): 65 Feet Assistive device: Rolling walker (2  wheeled) Gait Pattern/deviations: Step-through pattern;Trunk flexed;Decreased stride length     General Gait Details: initated gait without an assistive device, however short steps and noted decr balance; walked with RW thereafter; cues for RW proximity and to keep feet mostly inside the 4 corners of the RW  Stairs            Wheelchair Mobility    Modified Rankin (Stroke Patients Only)       Balance                                             Pertinent Vitals/Pain Pain Assessment: No/denies pain (but endorses general fatigue and malaise)    Home Living Family/patient expects to be discharged to:: Private residence Living Arrangements: Children Available Help at Discharge: Family;Available PRN/intermittently (Daughter works) Type of Home: House Home Access: Stairs to enter Entrance Stairs-Rails: Right Entrance Stairs-Number of Steps: 4 Home Layout: Able to live on main level with bedroom/bathroom;Two level Home Equipment: None (this needs to be verified)      Prior Function Level of Independence: Independent         Comments: Mows lawns.     Hand Dominance        Extremity/Trunk Assessment   Upper Extremity Assessment: Generalized weakness           Lower Extremity Assessment: Generalized weakness      Cervical / Trunk Assessment: Normal  Communication   Communication: No difficulties  Cognition Arousal/Alertness: Awake/alert (but sleepy)  Behavior During Therapy: WFL for tasks assessed/performed Overall Cognitive Status: Within Functional Limits for tasks assessed                      General Comments      Exercises        Assessment/Plan    PT Assessment Patient needs continued PT services  PT Diagnosis Difficulty walking;Generalized weakness   PT Problem List Decreased strength;Decreased activity tolerance;Decreased balance;Decreased mobility;Decreased coordination;Decreased knowledge of use of  DME;Decreased safety awareness  PT Treatment Interventions DME instruction;Gait training;Stair training;Functional mobility training;Therapeutic activities;Therapeutic exercise;Balance training;Patient/family education   PT Goals (Current goals can be found in the Care Plan section) Acute Rehab PT Goals Patient Stated Goal: did not state PT Goal Formulation: With patient Time For Goal Achievement: 02/28/16 Potential to Achieve Goals: Good    Frequency Min 3X/week   Barriers to discharge Decreased caregiver support Must be modified independent to dc home    Co-evaluation               End of Session Equipment Utilized During Treatment: Gait belt Activity Tolerance: Patient tolerated treatment well Patient left: in bed;with call bell/phone within reach (No recliner in room) Nurse Communication: Mobility status         Time: 1145 (start time is approximate)-1203 PT Time Calculation (min) (ACUTE ONLY): 18 min   Charges:   PT Evaluation $PT Eval Moderate Complexity: 1 Procedure     PT G CodesOlen Pel 02/14/2016, 1:19 PM  Van Clines, PT  Acute Rehabilitation Services Pager [phone] Office [phone]

## 2016-02-14 NOTE — Progress Notes (Signed)
Received report

## 2016-02-14 NOTE — Progress Notes (Addendum)
Initial Nutrition Assessment  DOCUMENTATION CODES:   Non-severe (moderate) malnutrition in context of chronic illness  INTERVENTION:  Provide Nepro Shake po BID, each supplement provides 425 kcal and 19 grams protein.  Encourage adequate PO intake.   RD to continue to monitor.  NUTRITION DIAGNOSIS:   Malnutrition related to chronic illness as evidenced by moderate depletion of body fat, severe depletion of muscle mass.  GOAL:   Patient will meet greater than or equal to 90% of their needs  MONITOR:   PO intake, Supplement acceptance, Weight trends, Labs, I & O's, Skin  REASON FOR ASSESSMENT:   Malnutrition Screening Tool    ASSESSMENT:   82 y.o. male with PMH of ESRD (s/p of AVF 01/25/16, not started dialysis yet), hypertension, hyperlipidemia, diabetes mellitus, GERD, gout, Lewy body dementia, PVD, CAD, bilateral carotid artery stenosis (S/P of left carotid artery endarterectomy), anemia, diastolic HCF, who presents with altered mental status, fever and generalized weakness. Patient was found to have positive urinalysis with large amount of leukocytes  Pt reports having a good appetite currently and PTA with usual consumption of at least 3 meals a day. Meal completion this AM 50%. Pt reports he eats well when given foods he prefers. Pt reports his usual body weight is unknown, however says he has lost weight. Pt unable to quantify amount of weight lost. Per Epic weight records, pt with a 10.5% weight loss in 7 months, not significant for time frame. Pt reports consuming nutritional protein shakes on occasion and is agreeable to receiving them while hospitalized. RD to order. Pt encouraged to eat his food at meals.   Nutrition-Focused physical exam completed. Findings are moderate fat depletion, moderate to severe muscle depletion, and no edema.   Labs and medications reviewed.   Diet Order:  Diet heart healthy/carb modified Room service appropriate?: Yes; Fluid consistency::  Thin  Skin:  Reviewed, no issues  Last BM:  4/18  Height:   Ht Readings from Last 1 Encounters:  02/13/16 5\' 6"  (1.676 m)    Weight:   Wt Readings from Last 1 Encounters:  02/13/16 145 lb (65.772 kg)    Ideal Body Weight:  64.5 kg  BMI:  Body mass index is 23.41 kg/(m^2).  Estimated Nutritional Needs:   Kcal:  1800-1950  Protein:  75-85 grams  Fluid:  1.8 - 1.9 L/day  EDUCATION NEEDS:   No education needs identified at this time  Roslyn SmilingStephanie Lakindra Wible, MS, RD, LDN Pager # [phone] After hours/ weekend pager # [phone]

## 2016-02-15 LAB — URINE CULTURE

## 2016-02-15 LAB — GLUCOSE, CAPILLARY
GLUCOSE-CAPILLARY: 125 mg/dL — AB (ref 65–99)
GLUCOSE-CAPILLARY: 175 mg/dL — AB (ref 65–99)
GLUCOSE-CAPILLARY: 138 mg/dL — AB (ref 65–99)
Glucose-Capillary: 176 mg/dL — ABNORMAL HIGH (ref 65–99)

## 2016-02-15 LAB — CBC
HCT: 24.5 % — ABNORMAL LOW (ref 39.0–52.0)
Hemoglobin: 7.9 g/dL — ABNORMAL LOW (ref 13.0–17.0)
MCH: 28.9 pg (ref 26.0–34.0)
MCHC: 32.2 g/dL (ref 30.0–36.0)
MCV: 89.7 fL (ref 78.0–100.0)
PLATELETS: 184 10*3/uL (ref 150–400)
RBC: 2.73 MIL/uL — AB (ref 4.22–5.81)
RDW: 16.5 % — AB (ref 11.5–15.5)
WBC: 6.9 10*3/uL (ref 4.0–10.5)

## 2016-02-15 LAB — BASIC METABOLIC PANEL
Anion gap: 13 (ref 5–15)
BUN: 127 mg/dL — AB (ref 6–20)
CALCIUM: 8.7 mg/dL — AB (ref 8.9–10.3)
CO2: 24 mmol/L (ref 22–32)
CREATININE: 3.77 mg/dL — AB (ref 0.61–1.24)
Chloride: 100 mmol/L — ABNORMAL LOW (ref 101–111)
GFR calc non Af Amer: 14 mL/min — ABNORMAL LOW (ref >60)
GFR, EST AFRICAN AMERICAN: 16 mL/min — AB (ref >60)
Glucose, Bld: 126 mg/dL — ABNORMAL HIGH (ref 65–99)
Potassium: 3.6 mmol/L (ref 3.5–5.1)
SODIUM: 137 mmol/L (ref 135–145)

## 2016-02-15 MED ORDER — CEPHALEXIN 250 MG PO CAPS
250.0000 mg | ORAL_CAPSULE | Freq: Every evening | ORAL | Status: DC
  Administered 2016-02-15 – 2016-02-17 (×3): 250 mg via ORAL
  Filled 2016-02-15 (×4): qty 1

## 2016-02-15 NOTE — Evaluation (Signed)
Occupational Therapy Evaluation Patient Details Name: [name] MRN: [id number] DOB: 12-Jul-1930 Today's Date: 02/15/2016    History of Present Illness [name] is a 82 y.o. male with PMH of ESRD (s/p of AVF 01/25/16, not started dialysis yet), hypertension, hyperlipidemia, diabetes mellitus, GERD, gout, Lewy body dementia, PVD, CAD, bilateral carotid artery stenosis (S/P of left carotid artery endarterectomy), anemia, diastolic HCF, who presents with altered mental status, fever and generalized weakness.   Clinical Impression   Pt admitted with the above diagnosis and demonstrates the deficits listed below. Pt would benefit from cont OT services to increase his independence with basic adls and adl transfers so he can d/c back home with his daughter at the mod I level of care.  Pt was previously independent and is home alone at times so needs to be mod I.  If not, pt may need increased S at home when daughter at work or may need SNF level of care for short while.  With Lewy Body dementia, feel several changes of venue may be very confusing to this pt.      Follow Up Recommendations  Home health OT;Supervision/Assistance - 24 hour    Equipment Recommendations  None recommended by OT;Other (comment) (need to confirm he has the equpiment he says he has.)    Recommendations for Other Services       Precautions / Restrictions Precautions Precautions: Fall Restrictions Weight Bearing Restrictions: No      Mobility Bed Mobility Overal bed mobility: Needs Assistance Bed Mobility: Supine to Sit     Supine to sit: Min guard     General bed mobility comments: min guard  to steady self  Transfers Overall transfer level: Needs assistance Equipment used: None Transfers: Sit to/from [employer] Sit to Stand: Min assist (from very low seat in room.) Stand pivot transfers: Min guard       General transfer comment: close guard to steady; initially unsteady and  reaching out for UE support    Balance Overall balance assessment: Needs assistance Sitting-balance support: Feet supported Sitting balance-Leahy Scale: Fair   Postural control: Posterior lean Standing balance support: During functional activity;Single extremity supported Standing balance-Leahy Scale: Fair Standing balance comment: Pt attempted to walk without assist but needed+1 steading assist when on his feet. Would benefit from AD for now.                            ADL Overall ADL's : Needs assistance/impaired Eating/Feeding: Independent;Sitting   Grooming: Wash/dry hands;Wash/dry face;Oral care;Min guard;Standing;Cueing for safety Grooming Details (indicate cue type and reason): stood at sink for 4 minutes.  pt did not do thorough job and required min guard for balance. Upper Body Bathing: Set up;Sitting;Cueing for safety   Lower Body Bathing: Minimal assistance;Sit to/from stand;Cueing for safety Lower Body Bathing Details (indicate cue type and reason): Pt unable to transition to a sponge bath. Pt used to getting into the shower so sponge bathing was confusing for hi. Upper Body Dressing : Set up;Sitting   Lower Body Dressing: Min guard;Sit to/from stand Lower Body Dressing Details (indicate cue type and reason): min guard only needed when standing to manage pants. Pt independent donning socks/shoes etc. Toilet Transfer: Min guard;Comfort height toilet;Grab bars Toilet Transfer Details (indicate cue type and reason): min guard for balance when on his feet. Toileting- Architect and Hygiene: Min guard;Sit to/from stand;Cueing for safety Toileting - Clothing Manipulation Details (indicate cue type  and reason): Pt did well with toileting but on his feet required min guard due to decreased balance.     Functional mobility during ADLs: Min guard General ADL Comments: Pt doing overall well with his adls. Pt is unsteady on his feet without an assistive device.  Pt has little insight to his deficits and gets agitated when asked to do things in his room.  Pt appears to attempt to "cover" for his deficits.  Pt confused when doing adls that were not done exactly how he does them at home.     Vision Vision Assessment?: No apparent visual deficits   Perception Perception Perception Tested?: Yes Comments: no deficits   Praxis Praxis Praxis tested?: Within functional limits    Pertinent Vitals/Pain Pain Assessment: No/denies pain     Hand Dominance Right   Extremity/Trunk Assessment Upper Extremity Assessment Upper Extremity Assessment: Generalized weakness   Lower Extremity Assessment Lower Extremity Assessment: Defer to PT evaluation   Cervical / Trunk Assessment Cervical / Trunk Assessment: Normal   Communication Communication Communication: No difficulties   Cognition Arousal/Alertness: Awake/alert Behavior During Therapy: WFL for tasks assessed/performed Overall Cognitive Status: Impaired/Different from baseline Area of Impairment: Orientation;Memory;Problem solving Orientation Level: Disoriented to;Place;Time   Memory: Decreased short-term memory       Problem Solving: Slow processing General Comments: Pt unable to figure out how to get toothbrush out of wrapper.   General Comments       Exercises       Shoulder Instructions      Home Living Family/patient expects to be discharged to:: Private residence Living Arrangements: Children Available Help at Discharge: Family;Available PRN/intermittently Type of Home: House Home Access: Stairs to enter [employer] of Steps: 4 Entrance Stairs-Rails: Right Home Layout: Able to live on main level with bedroom/bathroom;Two level     Bathroom Shower/Tub: Tub/shower unit;Curtain Shower/tub characteristics: Engineer, building services: Standard     Home Equipment: Environmental consultant - 2 wheels;Cane - single point;Shower seat (per pt report. please verifiy)          Prior  Functioning/Environment Level of Independence: Independent        Comments: Mows lawns.    OT Diagnosis: Generalized weakness;Cognitive deficits   OT Problem List: Impaired balance (sitting and/or standing);Decreased cognition;Decreased safety awareness;Decreased knowledge of use of DME or AE;Decreased knowledge of precautions   OT Treatment/Interventions: Self-care/ADL training;DME and/or AE instruction;Therapeutic activities;Balance training    OT Goals(Current goals can be found in the care plan section) Acute Rehab OT Goals Patient Stated Goal: I want to get out of here. OT Goal Formulation: With patient Time For Goal Achievement: 02/22/16 Potential to Achieve Goals: Good ADL Goals Pt Will Perform Grooming: with modified independence;standing Pt Will Perform Lower Body Bathing: with modified independence;sit to/from stand Pt Will Perform Lower Body Dressing: with modified independence;sit to/from stand Pt Will Perform Tub/Shower Transfer: Tub transfer;with supervision;ambulating;shower seat Additional ADL Goal #1: Pt will walk to bathroom with appropriate AD and fully toilet on regular commode with rails with mod I. Additional ADL Goal #2: Pt will answer basic orientation questions with 100% accuracy to increase ability to take care of own bills at home.  OT Frequency: Min 2X/week   Barriers to D/C: Decreased caregiver support  daughter works and pt is home alone. May need someone 24/7 for first while home.       Co-evaluation              End of Session Nurse Communication: Mobility status  Activity Tolerance:  Patient tolerated treatment well Patient left: in chair;with call bell/phone within reach;with chair alarm set   Time: [id number] OT Time Calculation (min): 27 min Charges:  OT General Charges $OT Visit: 1 Procedure OT Evaluation $OT Eval Moderate Complexity: 1 Procedure OT Treatments $Self Care/Home Management : 8-22 mins G-Codes:    Hope BuddsJones, Jauna Raczynski  Anne 02/15/2016, 9:25 AM  [phone]

## 2016-02-15 NOTE — Progress Notes (Signed)
PROGRESS NOTE  [name] RUE:[id number] DOB: [birth year] DOA: 02/13/2016 PCP: Lupita Raider, MD  HPI/Recap of past 85 hours: 82 year old male with past medical history of dementia, stage V chronic kidney disease not yet on dialysis although has fistula placed came in with confusion felt to be secondary to UTI as well as worsening renal failure. Patient started on IV fluids and antibiotics.  Over the next few days, patient has continued to do well. Creatinine slowly improving back to baseline. He himself has no complaints.  Assessment/Plan: Principal Problem:   UTI (lower urinary tract infection): Urine positive for Escherichia coli, pansensitive. Antibiotics changed to Keflex Active Problems:   Peripheral arterial disease (HCC)   Claudication in peripheral vascular disease (HCC)   DNR (do not resuscitate): In discussion with patient's daughter, requesting palliative care given overall issues with dementia and renal disease. Awaiting consult   ESRD (end stage renal disease) (HCC): Creatinine improving, almost at baseline. She continued to make urine, fistula in place, but no need for dialysis at this time   Essential hypertension: Blood pressure improved with IV fluids   HLD (hyperlipidemia)   Gout of elbow due to renal impairment   Controlled type 2 diabetes mellitus with diabetic nephropathy (HCC): Blood sugar stable, staying below 200   Lewy body dementia without behavioral disturbance/ Acute encephalopathy secondary to UTI and worsening renal failure: Looks to be resolving   Chronic diastolic CHF (congestive heart failure) (HCC): Slightly hypovolemic    Malnutrition of moderate degree: Patient meets criteria in the context of chronic illness. Seen by nutrition. Started on Nepro shake twice a day   Code Status: DO NOT RESUSCITATE  Family Communication: Updated daughter by phone  Disposition Plan:  Anticipate discharge tomorrow once out of care consult complete and renal function  back to baseline   Consultants:  Palliative care   Procedures:  None   Antibiotics:  IV Rocephin 4 /17-4/19  Keflex 4/19-present  Objective: BP 135/48 mmHg  Pulse 67  Temp(Src) 97.6 F (36.4 C) (Oral)  Resp 18  Ht  (1.676 m)  Wt 65.772 kg (145 lb)  BMI 23.41 kg/m2  SpO2 99%  Intake/Output Summary (Last 24 hours) at 02/15/16 1557 Last data filed at 02/15/16 1415  Gross per 24 hour  Intake   2055 ml  Output    350 ml  Net   1705 ml   Filed Weights   02/13/16 1805  Weight: 65.772 kg (145 lb)    Exam: No change from previous day  General:  Alert and oriented 2   Cardiovascular: regular rate and rhythm, S1-S2  Respiratory: clear to auscultation bilaterally   Abdomen: soft, nontender, nondistended, positive bowel sounds   Musculoskeletal: fistula noted, no edema    Data Reviewed: Basic Metabolic Panel:  Recent Labs Lab 02/13/16 1815 02/14/16 0115 02/15/16 0550  NA 137 136 137  K 3.8 3.4* 3.6  CL 95* 98* 100*  CO2 GLUCOSE 135* 189* 126*  BUN 142* 136* 127*  CREATININE 4.62* 4.35* 3.77*  CALCIUM 9.3 8.9 8.7*   Liver Function Tests:  Recent Labs Lab 02/13/16 1815 02/14/16 0115  AST 22 21  ALT 13* 12*  ALKPHOS 108 94  BILITOT 0.7 0.3  PROT 5.5* 4.8*  ALBUMIN 2.4* 2.1*   No results for input(s): LIPASE, AMYLASE in the last 168 hours. No results for input(s): AMMONIA in the last 168 hours. CBC:  Recent Labs Lab 02/09/16 0850 02/13/16 1815 02/14/16 0115 02/15/16 0550  WBC  --  10.2 8.7 6.9  NEUTROABS  --  8.8*  --   --   HGB 11.6* 10.0* 8.8* 7.9*  HCT  --  30.7* 28.3* 24.5*  MCV  --  90.6 90.7 89.7  PLT  --  211 194 184   Cardiac Enzymes:   No results for input(s): CKTOTAL, CKMB, CKMBINDEX, TROPONINI in the last 168 hours. BNP (last 3 results)  Recent Labs  09/11/15 0955 11/16/15 1015 02/14/16 0115  BNP 2200.4* 2120.5* 331.7*    ProBNP (last 3 results) No results for input(s): PROBNP in the last 8760  hours.  CBG:  Recent Labs Lab 02/14/16 1211 02/14/16 1703 02/14/16 2134 02/15/16 0803 02/15/16 1209  GLUCAP 187* 177* 167* 125* 176*    Recent Results (from the past 240 hour(s))  Urine culture     Status: Abnormal   Collection Time: 02/13/16  8:35 PM  Result Value Ref Range Status   Specimen Description URINE, RANDOM  Final   Special Requests NONE  Final   Culture >=100,000 COLONIES/mL ESCHERICHIA COLI (A)  Final   Report Status 02/15/2016 FINAL  Final   Organism ID, Bacteria ESCHERICHIA COLI (A)  Final      Susceptibility   Escherichia coli - MIC*    AMPICILLIN 8 SENSITIVE Sensitive     CEFAZOLIN <=4 SENSITIVE Sensitive     CEFTRIAXONE <=1 SENSITIVE Sensitive     CIPROFLOXACIN <=0.25 SENSITIVE Sensitive     GENTAMICIN <=1 SENSITIVE Sensitive     IMIPENEM <=0.25 SENSITIVE Sensitive     NITROFURANTOIN <=16 SENSITIVE Sensitive     TRIMETH/SULFA <=20 SENSITIVE Sensitive     AMPICILLIN/SULBACTAM 4 SENSITIVE Sensitive     PIP/TAZO <=4 SENSITIVE Sensitive     * >=100,000 COLONIES/mL ESCHERICHIA COLI  Culture, blood (Routine x 2)     Status: None (Preliminary result)   Collection Time: 02/13/16  8:45 PM  Result Value Ref Range Status   Specimen Description BLOOD LEFT HAND  Final   Special Requests BOTTLES DRAWN AEROBIC ONLY 5CC  Final   Culture NO GROWTH 2 DAYS  Final   Report Status PENDING  Incomplete  Culture, blood (Routine x 2)     Status: None (Preliminary result)   Collection Time: 02/13/16  8:50 PM  Result Value Ref Range Status   Specimen Description BLOOD LEFT ARM  Final   Special Requests BOTTLES DRAWN AEROBIC ONLY 5CC  Final   Culture NO GROWTH 2 DAYS  Final   Report Status PENDING  Incomplete     Studies: No results found.  Scheduled Meds: . allopurinol  100 mg Oral Daily  . amLODipine  5 mg Oral Daily  . aspirin EC  81 mg Oral QPM  . atorvastatin  80 mg Oral q1800  . calcitRIOL  0.5 mcg Oral Daily  . cephALEXin  250 mg Oral QPM  . cloNIDine  0.1  mg Oral TID  . clopidogrel  75 mg Oral Daily  . feeding supplement (NEPRO CARB STEADY)  237 mL Oral BID BM  . ferrous sulfate  325 mg Oral BID WC  . heparin subcutaneous  5,000 Units Subcutaneous 3 times per day  . insulin aspart  0-5 Units Subcutaneous QHS  . insulin aspart  0-9 Units Subcutaneous TID WC  . isosorbide mononitrate  60 mg Oral Daily  . lanthanum  1,000 mg Oral TID WC  . metoprolol succinate  50 mg Oral Daily  . multivitamin  1 tablet Oral Daily  . pantoprazole  40 mg Oral Daily  . sodium chloride flush  3 mL Intravenous Q12H    Continuous Infusions: . sodium chloride 75 mL/hr at 02/15/16 1208     Time spent: 10 minutes   Hollice EspyKRISHNAN,SENDIL K  [facility] Pager [phone] . If 7PM-7AM, please contact night-coverage at [email or web address], password [email or web address] 02/15/2016, 3:57 PM  LOS: 2 days

## 2016-02-16 LAB — BASIC METABOLIC PANEL
ANION GAP: 11 (ref 5–15)
BUN: 106 mg/dL — ABNORMAL HIGH (ref 6–20)
CHLORIDE: 104 mmol/L (ref 101–111)
CO2: 23 mmol/L (ref 22–32)
CREATININE: 2.87 mg/dL — AB (ref 0.61–1.24)
Calcium: 8.6 mg/dL — ABNORMAL LOW (ref 8.9–10.3)
GFR calc non Af Amer: 19 mL/min — ABNORMAL LOW (ref >60)
GFR, EST AFRICAN AMERICAN: 22 mL/min — AB (ref >60)
Glucose, Bld: 164 mg/dL — ABNORMAL HIGH (ref 65–99)
POTASSIUM: 3.5 mmol/L (ref 3.5–5.1)
SODIUM: 138 mmol/L (ref 135–145)

## 2016-02-16 LAB — GLUCOSE, CAPILLARY
Glucose-Capillary: 125 mg/dL — ABNORMAL HIGH (ref 65–99)
Glucose-Capillary: 155 mg/dL — ABNORMAL HIGH (ref 65–99)
Glucose-Capillary: 163 mg/dL — ABNORMAL HIGH (ref 65–99)
Glucose-Capillary: 212 mg/dL — ABNORMAL HIGH (ref 65–99)

## 2016-02-16 MED ORDER — CEPHALEXIN 250 MG PO CAPS: 250.0000 mg | ORAL_CAPSULE | Freq: Every evening | ORAL | Status: DC

## 2016-02-16 MED ORDER — CAMPHOR-MENTHOL 0.5-0.5 % EX LOTN
TOPICAL_LOTION | CUTANEOUS | Status: DC | PRN
Start: 2016-02-16 — End: 2016-02-17
  Filled 2016-02-16: qty 222

## 2016-02-16 MED ORDER — NEPRO/CARBSTEADY PO LIQD: 237.0000 mL | Freq: Two times a day (BID) | ORAL | Status: DC

## 2016-02-16 NOTE — Progress Notes (Signed)
Physical Therapy Treatment Patient Details Name: Bryan ArchRoy C Curling MRN: [id number] DOB: 12/10/1929 Today's Date: 02/16/2016    History of Present Illness [name] is a 82 y.o. male with PMH of ESRD (s/p of AVF 01/25/16, not started dialysis yet), hypertension, hyperlipidemia, diabetes mellitus, GERD, gout, Lewy body dementia, PVD, CAD, bilateral carotid artery stenosis (S/P of left carotid artery endarterectomy), anemia, diastolic HCF, who presents with altered mental status, fever and generalized weakness.    PT Comments    Patient making progress with mobility and gait.  Continues to require verbal cues for safety.  Follow Up Recommendations  Home health PT;Supervision - Intermittent     Equipment Recommendations  Rolling walker with 5" wheels;3in1 (PT)    Recommendations for Other Services       Precautions / Restrictions Precautions Precautions: Fall Restrictions Weight Bearing Restrictions: No    Mobility  Bed Mobility               General bed mobility comments: Patient in chair  Transfers Overall transfer level: Needs assistance Equipment used: Rolling walker (2 wheeled) Transfers: Sit to/from Stand Sit to Stand: Min guard         General transfer comment: Min guard for safety.  Needed cues for safe hand placement.  Ambulation/Gait Ambulation/Gait assistance: Min assist Ambulation Distance (Feet): 140 Feet Assistive device: Rolling walker (2 wheeled) Gait Pattern/deviations: Step-through pattern;Decreased stride length;Trunk flexed Gait velocity: decreased Gait velocity interpretation: Below normal speed for age/gender General Gait Details: Verbal cues to stay inside RW.  Required assist for safety during turns.   Stairs            Wheelchair Mobility    Modified Rankin (Stroke Patients Only)       Balance Overall balance assessment: Needs assistance Sitting-balance support: Feet supported Sitting balance-Leahy Scale: Fair      Standing balance support: No upper extremity supported Standing balance-Leahy Scale: Fair Standing balance comment: Requires UE support for dynamic activities/gait                    Cognition Arousal/Alertness: Awake/alert Behavior During Therapy: WFL for tasks assessed/performed Overall Cognitive Status: No family/caregiver present to determine baseline cognitive functioning Area of Impairment: Memory;Problem solving     Memory: Decreased short-term memory       Problem Solving: Slow processing;Requires verbal cues General Comments: Pt able to state name, DOB and location but could not recall why he was brought to the hospital)    Exercises      General Comments        Pertinent Vitals/Pain Pain Assessment: No/denies pain    Home Living                      Prior Function            PT Goals (current goals can now be found in the care plan section) Acute Rehab PT Goals Patient Stated Goal: I want to get out of here. Progress towards PT goals: Progressing toward goals    Frequency  Min 3X/week    PT Plan Current plan remains appropriate    Co-evaluation             End of Session Equipment Utilized During Treatment: Gait belt Activity Tolerance: Patient tolerated treatment well Patient left: in chair;with call bell/phone within reach     Time: 1209-1219 PT Time Calculation (min) (ACUTE ONLY): 10 min  Charges:  $Gait Training: 8-22 mins  G Codes:      Vena Austria 02/16/2016, 1:06 PM Bryan Hurt. Renaldo Fiddler, [name] Acute Rehab Services Pager [phone]

## 2016-02-16 NOTE — Discharge Summary (Deleted)
Discharge Summary  [name] AVW:[id number] DOB: [birth year]  PCP: Lupita Raider, MD  Admit date: 02/13/2016 Discharge date: 02/16/2016  Time spent: 25 minutes   Recommendations for Outpatient Follow-up:  1. New medication: Keflex 250 mg daily at bedtime 4 days  2. Feeding supplementation: Nepro twice a day between meals 3. Patient being discharged with home hospice  Discharge Diagnoses:  Active Hospital Problems   Diagnosis Date Noted  . UTI (lower urinary tract infection) 02/13/2016  . Malnutrition of moderate degree 02/14/2016  . Acute encephalopathy 02/13/2016  . Chronic diastolic CHF (congestive heart failure) (HCC)   . Lewy body dementia without behavioral disturbance   . HLD (hyperlipidemia)   . 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 Essential hypertension   . ESRD (end stage renal disease) (HCC)   . Controlled type 2 diabetes mellitus with diabetic nephropathy (HCC)   . DNR (do not resuscitate)   . Claudication in peripheral vascular disease (HCC) 11/01/2014  . Peripheral arterial disease (HCC) 09/02/2013    Resolved Hospital Problems   Diagnosis Date Noted Date Resolved  No resolved problems to display.    Discharge Condition: Improved, being discharged home   Diet recommendation: Renal diet with Nepro twice a day between meals  Filed Vitals:   02/16/16 0830 02/16/16 1345  BP: 155/50 138/70  Pulse: 60 57  Temp: 98.3 F (36.8 C) 97.7 F (36.5 C)  Resp: 18 18    History of present illness:  82 year old male with past medical history of dementia, stage V chronic kidney disease not yet on dialysis although has fistula placed came in with confusion felt to be secondary to UTI as well as worsening renal failure. Patient started on IV fluids and antibiotics.  Hospital Course:  Principal Problem:   UTI (lower urinary tract infection): Urine positive for pansensitive Escherichia coli. IV Rocephin changed over to by mouth Keflex. Patient will be discharged  on 4 more days of antibiotics by mouth Active Problems:   Peripheral arterial disease (HCC)   Claudication in peripheral vascular disease (HCC)   DNR (do not resuscitate): In discussion with patient's daughter, she requested palliative care given overall issues with dementia and patient's age yet wanting to be aggressive with his renal disease. Palliative care saw patient on 4/20 and after discussion with family and patient, decision made to enact home hospice. Patient meets criteria given his comorbidities. Family will work to make arrangements at home and he was felt to be okay for discharge by 4/21    Acute kidney injury in the setting of ESRD (end stage renal disease) (HCC): Creatinine has continued to improve and by day of discharge, down to 2.87 with a GFR of 20.   Essential hypertension: Improved with IV fluids.   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 Resolved with treatment   Chronic diastolic CHF (congestive heart failure) (HCC): Initially hypovolemic. Rehydrated. We'll restart aggressive Lasix upon discharge.   Acute encephalopathy   Malnutrition of moderate degree: Patient meets criteria in the context of chronic illness. Seen by nutrition. Started on Nepro shake twice a day   Procedures:  None   Consultations:  Palliative care   Discharge Exam: BP 138/70 mmHg  Pulse 57  Temp(Src) 97.7 F (36.5 C) (Oral)  Resp 18  Ht  (1.676 m)  Wt 65.1 kg (143 lb 8.3 oz)  BMI 23.18 kg/m2  SpO2 99%  General: Alert and oriented 2, no acute distress  Cardiovascular: Regular rate and rhythm, borderline tachycardia  Respiratory: Clear to auscultation bilaterally   Discharge Instructions You were cared for by a hospitalist  during your hospital stay. If you have any questions about your discharge medications or the care you received while you were in the hospital after you are discharged, you can call the unit and asked to speak with the hospitalist on call if the hospitalist that took care of you is not available. Once you are discharged, your primary care physician will handle any further medical issues. Please note that NO REFILLS for any discharge medications will be authorized once you are discharged, as it is imperative that you return to your primary care physician (or establish a relationship with a primary care physician if you do not have one) for your aftercare needs so that they can reassess your need for medications and monitor your lab values.     Medication List    TAKE these medications        ACCU-CHEK FASTCLIX LANCETS Misc  Use to check blood sugar 2 times per day dx code E11.9     allopurinol 300 MG tablet  Commonly known as:  ZYLOPRIM  Take 300 mg by mouth daily.     amLODipine 10 MG tablet  Commonly known as:  NORVASC  Take 0.5 tablets (5 mg total) by mouth daily.     aspirin EC 81 MG tablet  Take 81 mg by mouth every evening.     atorvastatin 80 MG tablet  Commonly known as:  LIPITOR  Take 1 tablet (80 mg total) by mouth daily at 6 PM.     calcitRIOL 0.25 MCG capsule  Commonly known as:  ROCALTROL  Take 2 capsules (0.5 mcg total) by mouth daily.     cephALEXin 250 MG capsule  Commonly known as:  KEFLEX  Take 1 capsule (250 mg total) by mouth every evening.     cloNIDine 0.1 MG tablet  Commonly known as:  CATAPRES  Take 1 tablet (0.1 mg total) by mouth 3 (three) times daily. Please schedule appointment for refills.     clopidogrel 75 MG tablet  Commonly known as:  PLAVIX  Take 1 tablet (75 mg total) by mouth daily. KEEP OV.     feeding supplement (NEPRO CARB STEADY) Liqd  Take 237 mLs by mouth 2 (two) times daily between meals.     ferrous sulfate 325 (65 FE) MG tablet    Take 325 mg by mouth 2 (two) times daily with a meal.     furosemide 80 MG tablet  Commonly known as:  LASIX  Take 160 mg by mouth 3 (three) times daily.     glucose blood test strip  Commonly known as:  ACCU-CHEK AVIVA PLUS  Use as instructed to check blood sugar 2 times per day dx code E11.9     isosorbide mononitrate 60 MG 24 hr tablet  Commonly known as:  IMDUR  Take 1 tablet (60 mg total) by mouth daily.     JANUVIA 25 MG tablet  Generic drug:  sitaGLIPtin  TAKE 1 TABLET BY MOUTH DAILY.     lanthanum 1000 MG chewable tablet  Commonly known as:  FOSRENOL  Chew 1 tablet (1,000 mg total) by mouth 3 (three) times daily with meals.  metoprolol succinate 50 MG 24 hr tablet  Commonly known as:  TOPROL-XL  Take 1 tablet (50 mg total) by mouth daily. Take with or immediately following a meal.     polyethylene glycol packet  Commonly known as:  MIRALAX / GLYCOLAX  Take 17 g by mouth daily as needed. constipation     Potassium Chloride ER 20 MEQ Tbcr  Take 20 mEq by mouth daily.     PRESERVISION AREDS PO  Take 2 tablets by mouth daily.       Allergies  Allergen Reactions  . Labetalol Swelling    angioedema      The results of significant diagnostics from this hospitalization (including imaging, microbiology, ancillary and laboratory) are listed below for reference.    Significant Diagnostic Studies: Dg Chest 2 View  02/13/2016  CLINICAL DATA:  End-stage renal disease. Diabetes. COPD. Hypertension. Dementia. Lethargy. EXAM: CHEST  2 VIEW COMPARISON:  11/23/2015 FINDINGS: Thoracic spondylosis.  Aortic atherosclerotic calcification. The patient is rotated to the right on today's radiograph, reducing diagnostic sensitivity and specificity. Hazy density at the right lung base, indistinctly marginated. The generalized pulmonary vascular indistinctness shown on the prior exam has improved. No blunting of the costophrenic angles. IMPRESSION: 1. Vague density at the right lung  base, indistinctly marginated, possibly from residual edema but technically nonspecific. I recommend followup chest radiography in 4 weeks time to ensure clearance of this process. If the process has not cleared, CT would be recommended for further workup. 2. Generally the interstitial opacity shown on the prior exam has improved. 3. Atherosclerosis. 4. Thoracic spondylosis. Electronically Signed   By: Gaylyn RongWalter  Liebkemann M.D.   On: 02/13/2016 18:51   Ct Head Wo Contrast  02/13/2016  CLINICAL DATA:  Acute presentation with confusion EXAM: CT HEAD WITHOUT CONTRAST TECHNIQUE: Contiguous axial images were obtained from the base of the skull through the vertex without intravenous contrast. COMPARISON:  None. FINDINGS: The brain shows generalized atrophy. There is advanced chronic appearing small vessel ischemic change throughout the cerebral hemispheric white matter an the pons. No identifiable acute infarction, though an acute insult could be hidden amongst the extensive chronic changes. No large vessel territory infarction. No mass lesion, hemorrhage, hydrocephalus or extra-axial collection. No calvarial abnormality. Sinuses are clear except for a retention cyst in the right maxillary sinus. There is atherosclerotic calcification of the major vessels at the base of the brain. IMPRESSION: No acute finding by CT. Advanced atrophy and chronic small vessel ischemic changes throughout the brain. Certainly, a small acute insult could be hidden amongst the extensive chronic changes. Electronically Signed   By: Paulina FusiMark  Shogry M.D.   On: 02/13/2016 20:31    Microbiology: Recent Results (from the past 240 hour(s))  Urine culture     Status: Abnormal   Collection Time: 02/13/16  8:35 PM  Result Value Ref Range Status   Specimen Description URINE, RANDOM  Final   Special Requests NONE  Final   Culture >=100,000 COLONIES/mL ESCHERICHIA COLI (A)  Final   Report Status 02/15/2016 FINAL  Final   Organism ID, Bacteria  ESCHERICHIA COLI (A)  Final      Susceptibility   Escherichia coli - MIC*    AMPICILLIN 8 SENSITIVE Sensitive     CEFAZOLIN <=4 SENSITIVE Sensitive     CEFTRIAXONE <=1 SENSITIVE Sensitive     CIPROFLOXACIN <=0.25 SENSITIVE Sensitive     GENTAMICIN <=1 SENSITIVE Sensitive     IMIPENEM <=0.25 SENSITIVE Sensitive     NITROFURANTOIN <=16 SENSITIVE  Sensitive     TRIMETH/SULFA <=20 SENSITIVE Sensitive     AMPICILLIN/SULBACTAM 4 SENSITIVE Sensitive     PIP/TAZO <=4 SENSITIVE Sensitive     * >=100,000 COLONIES/mL ESCHERICHIA COLI  Culture, blood (Routine x 2)     Status: None (Preliminary result)   Collection Time: 02/13/16  8:45 PM  Result Value Ref Range Status   Specimen Description BLOOD LEFT HAND  Final   Special Requests BOTTLES DRAWN AEROBIC ONLY 5CC  Final   Culture NO GROWTH 2 DAYS  Final   Report Status PENDING  Incomplete  Culture, blood (Routine x 2)     Status: None (Preliminary result)   Collection Time: 02/13/16  8:50 PM  Result Value Ref Range Status   Specimen Description BLOOD LEFT ARM  Final   Special Requests BOTTLES DRAWN AEROBIC ONLY 5CC  Final   Culture NO GROWTH 2 DAYS  Final   Report Status PENDING  Incomplete     Labs: Basic Metabolic Panel:  Recent Labs Lab 02/13/16 1815 02/14/16 0115 02/15/16 0550 02/16/16 1136  NA 137 136 137 138  K 3.8 3.4* 3.6 3.5  CL 95* 98* 100* 104  CO2 26 24 24 23   GLUCOSE 135* 189* 126* 164*  BUN 142* 136* 127* 106*  CREATININE 4.62* 4.35* 3.77* 2.87*  CALCIUM 9.3 8.9 8.7* 8.6*   Liver Function Tests:  Recent Labs Lab 02/13/16 1815 02/14/16 0115  AST 22 21  ALT 13* 12*  ALKPHOS 108 94  BILITOT 0.7 0.3  PROT 5.5* 4.8*  ALBUMIN 2.4* 2.1*   No results for input(s): LIPASE, AMYLASE in the last 168 hours. No results for input(s): AMMONIA in the last 168 hours. CBC:  Recent Labs Lab 02/13/16 1815 02/14/16 0115 02/15/16 0550  WBC 10.2 8.7 6.9  NEUTROABS 8.8*  --   --   HGB 10.0* 8.8* 7.9*  HCT 30.7* 28.3*  24.5*  MCV 90.6 90.7 89.7  PLT 211 194 184   Cardiac Enzymes: No results for input(s): CKTOTAL, CKMB, CKMBINDEX, TROPONINI in the last 168 hours. BNP: BNP (last 3 results)  Recent Labs  09/11/15 0955 11/16/15 1015 02/14/16 0115  BNP 2200.4* 2120.5* 331.7*    ProBNP (last 3 results) No results for input(s): PROBNP in the last 8760 hours.  CBG:  Recent Labs Lab 02/15/16 1209 02/15/16 1622 02/15/16 2033 02/16/16 0734 02/16/16 1203  GLUCAP 176* 175* 138* 125* 155*       Signed:  Kurk Corniel K  [facility] 02/16/2016, 2:46 PM

## 2016-02-16 NOTE — Care Management Important Message (Signed)
Important Message  Patient Details  Name: [name] MRN: [id number] Date of Birth: 01/19/1930   Medicare Important Message Given:  Yes    Roneka Gilpin P Qais Jowers 02/16/2016, 4:23 PM

## 2016-02-16 NOTE — Progress Notes (Signed)
Occupational Therapy Treatment Patient Details Name: [name] MRN: [id number] DOB: 1930-02-18 Today's Date: 02/16/2016    History of present illness [name] is a 82 y.o. male with PMH of ESRD (s/p of AVF 01/25/16, not started dialysis yet), hypertension, hyperlipidemia, diabetes mellitus, GERD, gout, Lewy body dementia, PVD, CAD, bilateral carotid artery stenosis (S/P of left carotid artery endarterectomy), anemia, diastolic HCF, who presents with altered mental status, fever and generalized weakness.   OT comments  Pt progressing with acute OT goals. Focus of session was toilet transfer and grooming in standing position. Session details below. D/c plan remains appropriate.    Follow Up Recommendations  Home health OT;Supervision/Assistance - 24 hour    Equipment Recommendations  None recommended by OT;Other (comment) (need to confirm he has the equpiment he says he has.)    Recommendations for Other Services      Precautions / Restrictions Precautions Precautions: Fall Restrictions Weight Bearing Restrictions: No       Mobility Bed Mobility               General bed mobility comments: pt in chair  Transfers Overall transfer level: Needs assistance Equipment used: None Transfers: Sit to/from Stand Sit to Stand: Min guard         General transfer comment: min guard for safety; from recliner and toilet     Balance Overall balance assessment: Needs assistance Sitting-balance support: Feet supported Sitting balance-Leahy Scale: Fair     Standing balance support: No upper extremity supported;Single extremity supported;During functional activity Standing balance-Leahy Scale: Fair Standing balance comment: stood to complete grooming at sink with intermittent UE support of sink                    ADL Overall ADL's : Needs assistance/impaired     Grooming: Wash/dry hands;Oral care;Min guard;Standing;Cueing for safety Grooming Details (indicate cue  type and reason): Stood for about 3 minutes at sink. Brushed dentures and washed hands. min guard for safety with balance.                  Toilet Transfer: Min guard;Comfort height toilet;Grab Agricultural consultant Details (indicate cue type and reason): min guard for balance when on his feet. pt utilized grab bars.          Functional mobility during ADLs: Min guard General ADL Comments: Pt ambulated to toilet with occasional use of external support for balance (IV pole, sink). Pt then completed 2 grooming tasks in standing position, min guard for safety. Pt oriented to person and place, unable to state reason he was in the hospital.       Vision                     Perception     Praxis      Cognition   Behavior During Therapy: [name] for tasks assessed/performed Overall Cognitive Status: No family/caregiver present to determine baseline cognitive functioning Area of Impairment: Memory;Problem solving     Memory: Decreased short-term memory        Problem Solving: Slow processing General Comments: Pt able to state name, DOB and location but could not recall why he was brought to the hospital)    Extremity/Trunk Assessment               Exercises     Shoulder Instructions       General Comments      Pertinent Vitals/ Pain  Pain Assessment: No/denies pain  Home Living                                          Prior Functioning/Environment              Frequency Min 2X/week     Progress Toward Goals  OT Goals(current goals can now be found in the care plan section)  Progress towards OT goals: Progressing toward goals  Acute Rehab OT Goals Patient Stated Goal: I want to get out of here. OT Goal Formulation: With patient Time For Goal Achievement: 02/22/16 Potential to Achieve Goals: Good ADL Goals Pt Will Perform Grooming: with modified independence;standing Pt Will Perform Lower Body Bathing: with modified  independence;sit to/from stand Pt Will Perform Lower Body Dressing: with modified independence;sit to/from stand Pt Will Perform Tub/Shower Transfer: Tub transfer;with supervision;ambulating;shower seat Additional ADL Goal #1: Pt will walk to bathroom with appropriate AD and fully toilet on regular commode with rails with mod I. Additional ADL Goal #2: Pt will answer basic orientation questions with 100% accuracy to increase ability to take care of own bills at home.  Plan Discharge plan remains appropriate    Co-evaluation                 End of Session Equipment Utilized During Treatment: Gait belt   Activity Tolerance Patient tolerated treatment well   Patient Left Other (comment) (sitting EOB with PT)   Nurse Communication          Time: 1610-9604: 1159-1209 OT Time Calculation (min): 10 min  Charges: OT General Charges $OT Visit: 1 Procedure OT Treatments $Self Care/Home Management : 8-22 mins  [name], [name] H 02/16/2016, 12:31 PM

## 2016-02-17 DIAGNOSIS — I5032 Chronic diastolic (congestive) heart failure: Secondary | ICD-10-CM

## 2016-02-17 LAB — GLUCOSE, CAPILLARY
GLUCOSE-CAPILLARY: 117 mg/dL — AB (ref 65–99)
GLUCOSE-CAPILLARY: 217 mg/dL — AB (ref 65–99)
Glucose-Capillary: 167 mg/dL — ABNORMAL HIGH (ref 65–99)

## 2016-02-17 MED ORDER — CAMPHOR-MENTHOL 0.5-0.5 % EX LOTN: TOPICAL_LOTION | CUTANEOUS | Status: AC | PRN

## 2016-02-17 MED ORDER — POLYETHYLENE GLYCOL 3350 17 G PO PACK
17.0000 g | PACK | ORAL | Status: AC
  Administered 2016-02-17: 17 g via ORAL
  Filled 2016-02-17: qty 1

## 2016-02-17 NOTE — Progress Notes (Signed)
Patient's IV has been pulled out. Patient states that he does not want another because he was told that he would be going home today. Will inform NP

## 2016-02-17 NOTE — Progress Notes (Signed)
PROGRESS NOTE  DIONYSIOS MASSMAN YSA:[id number] DOB: 01/20/1930 DOA: 02/13/2016 PCP: Mayra Neer, MD  HPI/Recap of past 55 hours: 82 year old male with past medical history of dementia, stage V chronic kidney disease not yet on dialysis although has fistula placed came in with confusion felt to be secondary to UTI as well as worsening renal failure. Patient started on IV fluids and antibiotics.  Over the next few days, patient has continued to do well. Creatinine continues to markedly improve. Patient himself no complaints and wants to know when he can go home  Assessment/Plan: Principal Problem:   UTI (lower urinary tract infection): Urine positive for Escherichia coli, pansensitive. Antibiotics changed to Keflex Active Problems:   Peripheral arterial disease ([place])   Claudication in peripheral vascular disease ([place])   DNR (do not resuscitate): In discussion with patient's daughter, requesting palliative care given overall issues with dementia and renal disease. They are scheduled to meet with the family later today   ESRD (end stage renal disease) ([place]): Creatinine creatinine continues to improve, better than it has been a long time. 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): Blood sugar stable, staying below 200   Lewy body dementia without behavioral disturbance/ Acute encephalopathy secondary to UTI and worsening renal failure: Resolved, back to baseline   Chronic diastolic CHF (congestive heart failure) ([place]): Slightly hypovolemic    Malnutrition of moderate degree: Patient meets criteria in the context of chronic illness. Seen by nutrition. Started on Nepro  shake twice a day   Code Status: DO NOT RESUSCITATE  Family Communication: Updated daughter by phone today  Disposition Plan:  Anticipate discharge later today or tomorrow based on how palliative care meeting goes  Addendum: Palliative care met with family with plans to initiate home hospice. Plan will be to discharge patient tomorrow   Consultants:  Palliative care   Procedures:  None   Antibiotics:  IV Rocephin 4 /17-4/19  Keflex 4/19-present  Objective: BP 158/65 mmHg  Pulse 71  Temp(Src) 97.9 F (36.6 C) (Oral)  Resp 18  Ht _0  (1.676 m)  Wt 65.1 kg (143 lb 8.3 oz)  BMI 23.18 kg/m2  SpO2 97%  Intake/Output Summary (Last 24 hours) at 02/17/16 1104 Last data filed at 02/17/16 0856  Gross per 24 hour  Intake   1860 ml  Output    385 ml  Net   1475 ml   Filed Weights   02/13/16 1805 02/15/16 2031  Weight: 65.772 kg (145 lb) 65.1 kg (143 lb 8.3 oz)    Exam:   General:  Alert and oriented 2   Cardiovascular: regular rate and rhythm, S1-S2  Respiratory: clear to auscultation bilaterally   Abdomen: soft, nontender, nondistended, positive bowel sounds   Musculoskeletal: fistula noted, no edema    Data Reviewed: Basic Metabolic Panel:  Recent Labs Lab 02/13/16 1815 02/14/16 0115 02/15/16 0550 02/16/16 1136  NA 137 136 137 138  K 3.8 3.4* 3.6 3.5  CL 95* 98* 100* 104  CO2 _1 GLUCOSE 135* 189* 126* 164*  BUN 142* 136* 127* 106*  CREATININE 4.62* 4.35* 3.77* 2.87*  CALCIUM 9.3 8.9 8.7* 8.6*  Liver Function Tests:  Recent Labs Lab 02/13/16 1815 02/14/16 0115  AST 22 21  ALT 13* 12*  ALKPHOS 108 94  BILITOT 0.7 0.3  PROT 5.5* 4.8*  ALBUMIN 2.4* 2.1*   No results for input(s): LIPASE, AMYLASE in the last 168 hours. No results for input(s): AMMONIA in the last 168 hours. CBC:  Recent Labs Lab 02/13/16 1815 02/14/16 0115 02/15/16 0550  WBC 10.2 8.7 6.9  NEUTROABS 8.8*  --   --   HGB 10.0* 8.8* 7.9*  HCT 30.7*  28.3* 24.5*  MCV 90.6 90.7 89.7  PLT 211 194 184   Cardiac Enzymes:   No results for input(s): CKTOTAL, CKMB, CKMBINDEX, TROPONINI in the last 168 hours. BNP (last 3 results)  Recent Labs  09/11/15 0955 11/16/15 1015 02/14/16 0115  BNP 2200.4* 2120.5* 331.7*    ProBNP (last 3 results) No results for input(s): PROBNP in the last 8760 hours.  CBG:  Recent Labs Lab 02/16/16 0734 02/16/16 1203 02/16/16 1623 02/16/16 2112 02/17/16 0747  GLUCAP 125* 155* 163* 212* 117*    Recent Results (from the past 240 hour(s))  Urine culture     Status: Abnormal   Collection Time: 02/13/16  8:35 PM  Result Value Ref Range Status   Specimen Description URINE, RANDOM  Final   Special Requests NONE  Final   Culture >=100,000 COLONIES/mL ESCHERICHIA COLI (A)  Final   Report Status 02/15/2016 FINAL  Final   Organism ID, Bacteria ESCHERICHIA COLI (A)  Final      Susceptibility   Escherichia coli - MIC*    AMPICILLIN 8 SENSITIVE Sensitive     CEFAZOLIN <=4 SENSITIVE Sensitive     CEFTRIAXONE <=1 SENSITIVE Sensitive     CIPROFLOXACIN <=0.25 SENSITIVE Sensitive     GENTAMICIN <=1 SENSITIVE Sensitive     IMIPENEM <=0.25 SENSITIVE Sensitive     NITROFURANTOIN <=16 SENSITIVE Sensitive     TRIMETH/SULFA <=20 SENSITIVE Sensitive     AMPICILLIN/SULBACTAM 4 SENSITIVE Sensitive     PIP/TAZO <=4 SENSITIVE Sensitive     * >=100,000 COLONIES/mL ESCHERICHIA COLI  Culture, blood (Routine x 2)     Status: None (Preliminary result)   Collection Time: 02/13/16  8:45 PM  Result Value Ref Range Status   Specimen Description BLOOD LEFT HAND  Final   Special Requests BOTTLES DRAWN AEROBIC ONLY 5CC  Final   Culture NO GROWTH 3 DAYS  Final   Report Status PENDING  Incomplete  Culture, blood (Routine x 2)     Status: None (Preliminary result)   Collection Time: 02/13/16  8:50 PM  Result Value Ref Range Status   Specimen Description BLOOD LEFT ARM  Final   Special Requests BOTTLES DRAWN AEROBIC ONLY 5CC   Final   Culture NO GROWTH 3 DAYS  Final   Report Status PENDING  Incomplete     Studies: No results found.  Scheduled Meds: . allopurinol  100 mg Oral Daily  . amLODipine  5 mg Oral Daily  . aspirin EC  81 mg Oral QPM  . atorvastatin  80 mg Oral q1800  . calcitRIOL  0.5 mcg Oral Daily  . cephALEXin  250 mg Oral QPM  . cloNIDine  0.1 mg Oral TID  . clopidogrel  75 mg Oral Daily  . feeding supplement (NEPRO CARB STEADY)  237 mL Oral BID BM  . ferrous sulfate  325 mg Oral BID WC  . heparin subcutaneous  5,000 Units Subcutaneous 3 times per day  . insulin  aspart  0-5 Units Subcutaneous QHS  . insulin aspart  0-9 Units Subcutaneous TID WC  . isosorbide mononitrate  60 mg Oral Daily  . lanthanum  1,000 mg Oral TID WC  . metoprolol succinate  50 mg Oral Daily  . multivitamin  1 tablet Oral Daily  . pantoprazole  40 mg Oral Daily  . sodium chloride flush  3 mL Intravenous Q12H    Continuous Infusions: . sodium chloride 75 mL/hr at 02/16/16 1739     Time spent: 10 minutes   [facility] Pager [phone] . If 7PM-7AM, please contact night-coverage at [email or web address], password [facility] 02/17/2016, 11:04 AM  LOS: 4 days

## 2016-02-17 NOTE — Care Management Note (Addendum)
Case Management Note  Patient Details  Name: [name] MRN: [id number] Date of Birth: 10/12/1930  Subjective/Objective:        CM following for progression and d/c planning.            Action/Plan: 02/17/2016 Spoke with Cindra PresumeSue Ellen Grounds, re d/c plans. The pt family has decided to pursue home hospice services and wish to [facility].  This CM contacted [facility] and placed referral. No immediate needs for DME, pt not on oxygen at this time and is ambulatory.   Daughter will pick up this pt today after work, after 6pm pt RN notified.   Expected Discharge Date:     02/17/2016             Expected Discharge Plan:  Home w Hospice Care  In-House Referral:  NA, Clinical Social Work  Discharge planning Services  CM Consult  Post Acute Care Choice:  Hospice Choice offered to:  Adult Children  DME Arranged:   NA DME Agency:   NA  HH Arranged:  RN, Nurse's Aide, Social Work [employer] Agency:  [facility]  Status of Service:  Completed, signed off  Medicare Important Message Given:  Yes Date Medicare IM Given:    Medicare IM give by:    Date Additional Medicare IM Given:    Additional Medicare Important Message give by:     If discussed at Long Length of Stay Meetings, dates discussed:    Additional Comments:  Starlyn SkeansRoyal, Khalee Mazo U, RN 02/17/2016, 1:29 PM

## 2016-02-17 NOTE — Progress Notes (Signed)
Notified by [name],  CMRN of family request for [facility] at home after discharge. Chart and patient information currently under review to confirm hospice eligibility.   Spoke with the patient and son [name]  at bedside to initiate education related to hospice philosophy, services and team approach to care. Family verbalized understanding of the information provided. Per discussion plan is for discharge to home by personal vehicle with  daughter today around 6:30 p.m.   Please send signed completed DNR form home with patient.   Patient will need prescriptions for discharge comfort medications.   DME needs discussed .  No DME was ordered. Patient already has a wheelchair, walker and cane.   The home address has been verified and is correct in the chart.   [facility] aware of the above.   Completed discharge summary will need to be faxed to [facility] at [phone] when final.  Please notify [facility] when patient is ready to leave unit at discharge-call [phone].   Bryan NeriSusan Giorgi Debruin RN  [facility]  [phone]

## 2016-02-17 NOTE — Consult Note (Signed)
Goals of care in room with patient, his daughter Eunice BlaseDebbie and his son Windy FastRoy Jr.    [name] is alert and oriented to self and surroundings.  He is sitting up in the chair and appears to have returned to his baseline condition prior to this admission.  Debbie lives with [name] although she works fulltime and is gone from the home about 10 hrs a day.  We discussed several options of care planning for future illness including HH, Hospice and Palliative care in home.  For the goals of quality of life that have been identified by patient and his family I believe that hospice would be the most helpful in meeting those goals.    Spoke with Elnita Maxwellheryl, [facility] and discussed possible referral for hospice tomorrow after family has time to discuss in private and verify that this plan is in accordance with goals.  willl follow in AM  Cindra PresumeSue Ellen Roise Emert, RN-BC, MSN, [facility]

## 2016-02-17 NOTE — Discharge Summary (Addendum)
Discharge Summary  [name] ZOX:[id number] DOB: 1930/02/27  PCP: Lupita Raider, MD  Admit date: 02/13/2016 Discharge date: 02/17/2016  Time spent: 25 minutes   Recommendations for Outpatient Follow-up:  1. New medication: Keflex 250 mg daily at bedtime 3 days  2. New medication: Sarna lotion, apply topically as needed for itching 3. Feeding supplementation: Nepro twice a day between meals 4. Patient being discharged with home hospice  Discharge Diagnoses:  Active Hospital Problems   Diagnosis Date Noted  . UTI (lower urinary tract infection) 02/13/2016  . Malnutrition of moderate degree 02/14/2016  . Acute encephalopathy 02/13/2016  . Chronic diastolic CHF (congestive heart failure) (HCC)   . Lewy body dementia without behavioral disturbance   . HLD (hyperlipidemia)   . 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 Essential hypertension   . ESRD (end stage renal disease) ([facility])   . Controlled type 2 diabetes mellitus with diabetic nephropathy ([facility])   . DNR (do not resuscitate)   . Claudication in peripheral vascular disease (HCC) 11/01/2014  . Peripheral arterial disease ([facility]) 09/02/2013    Resolved Hospital Problems   Diagnosis Date Noted Date Resolved  No resolved problems to display.    Discharge Condition: Improved, being discharged home   Diet recommendation: Renal diet with Nepro twice a day between meals  Filed Vitals:   02/17/16 0613 02/17/16 0856  BP: 156/50 158/65  Pulse:  71  Temp:  97.9 F (36.6 C)  Resp:  18    History of present illness:  82 year old male with past medical history of dementia, stage V chronic kidney disease not yet on dialysis although has fistula placed came in with confusion felt to be secondary to UTI as well as worsening renal failure. Patient started on IV fluids and antibiotics.  Hospital Course:  Principal Problem:   UTI (lower urinary tract infection): Urine positive for pansensitive Escherichia coli. IV Rocephin changed  over to by mouth Keflex. Patient will be discharged on 4 more days of antibiotics by mouth Active Problems:   Peripheral arterial disease (HCC)   Claudication in peripheral vascular disease (H[facility])   DNR (do not resuscitate): In discussion with patient's daughter, she requested palliative care given overall issues with dementia and patient's age yet wanting to be aggressive with his renal disease. Palliative care saw patient on 4/20 and after discussion with family and patient, decision made to enact home hospice. Patient meets criteria given his comorbidities. Family will work to make arrangements at home and he was felt to be okay for discharge by 4/21    Acute kidney injury in the setting of ESRD (end stage renal disease) (HCC): Creatinine has continued to improve and by day of discharge, down to 2.87 with a GFR of 20.   Essential hypertension: Improved with IV fluids.   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 Resolved with treatment   Chronic diastolic CHF (congestive heart failure) (H[facility]): Initially hypovolemic. Rehydrated. We'll restart aggressive Lasix upon discharge.   Acute encephalopathy   Malnutrition of moderate degree: Patient meets criteria in the context of chronic illness. Seen by nutrition. Started on Nepro shake twice a day   Procedures:  None   Consultations:  Palliative care   Discharge Exam: BP 158/65 mmHg  Pulse 71  Temp(Src) 97.9 F (36.6 C) (Oral)  Resp 18  Ht  (1.676 m)  Wt 65.1 kg (143 lb 8.3 oz)  BMI 23.18 kg/m2  SpO2 97%  General: Alert and oriented 2, no acute distress  Cardiovascular: Regular rate and rhythm, borderline tachycardia  Respiratory: Clear to auscultation bilaterally   Discharge  Instructions You were cared for by a hospitalist during your hospital stay. If you have any questions about your discharge medications or the care you received while you were in the hospital after you are discharged, you can call the unit and asked to speak with the hospitalist on call if the hospitalist that took care of you is not available. Once you are discharged, your primary care physician will handle any further medical issues. Please note that NO REFILLS for any discharge medications will be authorized once you are discharged, as it is imperative that you return to your primary care physician (or establish a relationship with a primary care physician if you do not have one) for your aftercare needs so that they can reassess your need for medications and monitor your lab values.     Medication List    TAKE these medications        ACCU-CHEK FASTCLIX LANCETS Misc  Use to check blood sugar 2 times per day dx code E11.9     allopurinol 300 MG tablet  Commonly known as:  ZYLOPRIM  Take 300 mg by mouth daily.     amLODipine 10 MG tablet  Commonly known as:  NORVASC  Take 0.5 tablets (5 mg total) by mouth daily.     aspirin EC 81 MG tablet  Take 81 mg by mouth every evening.     atorvastatin 80 MG tablet  Commonly known as:  LIPITOR  Take 1 tablet (80 mg total) by mouth daily at 6 PM.     calcitRIOL 0.25 MCG capsule  Commonly known as:  ROCALTROL  Take 2 capsules (0.5 mcg total) by mouth daily.     camphor-menthol lotion  Commonly known as:  SARNA  Apply topically as needed for itching.     cephALEXin 250 MG capsule  Commonly known as:  KEFLEX  Take 1 capsule (250 mg total) by mouth every evening.     cloNIDine 0.1 MG tablet  Commonly known as:  CATAPRES  Take 1 tablet (0.1 mg total) by mouth 3 (three) times daily. Please schedule appointment for refills.     clopidogrel 75 MG tablet  Commonly known as:  PLAVIX  Take 1 tablet (75 mg total) by mouth daily. KEEP OV.      feeding supplement (NEPRO CARB STEADY) Liqd  Take 237 mLs by mouth 2 (two) times daily between meals.     ferrous sulfate 325 (65 FE) MG tablet  Take 325 mg by mouth 2 (two) times daily with a meal.     furosemide 80 MG tablet  Commonly known as:  LASIX  Take 160 mg by mouth 3 (three) times daily.     glucose blood test strip  Commonly known as:  ACCU-CHEK AVIVA PLUS  Use as instructed to check blood sugar 2 times per day dx code E11.9     isosorbide mononitrate 60 MG 24 hr tablet  Commonly known as:  IMDUR  Take 1 tablet (60 mg total) by mouth daily.     JANUVIA 25 MG tablet  Generic drug:  sitaGLIPtin  TAKE 1 TABLET BY MOUTH DAILY.     lanthanum 1000 MG  chewable tablet  Commonly known as:  FOSRENOL  Chew 1 tablet (1,000 mg total) by mouth 3 (three) times daily with meals.     metoprolol succinate 50 MG 24 hr tablet  Commonly known as:  TOPROL-XL  Take 1 tablet (50 mg total) by mouth daily. Take with or immediately following a meal.     polyethylene glycol packet  Commonly known as:  MIRALAX / GLYCOLAX  Take 17 g by mouth daily as needed. constipation     Potassium Chloride ER 20 MEQ Tbcr  Take 20 mEq by mouth daily.     PRESERVISION AREDS PO  Take 2 tablets by mouth daily.        Allergies  Allergen Reactions  . Labetalol Swelling    angioedema      The results of significant diagnostics from this hospitalization (including imaging, microbiology, ancillary and laboratory) are listed below for reference.    Significant Diagnostic Studies: Dg Chest 2 View  02/13/2016  CLINICAL DATA:  End-stage renal disease. Diabetes. COPD. Hypertension. Dementia. Lethargy. EXAM: CHEST  2 VIEW COMPARISON:  11/23/2015 FINDINGS: Thoracic spondylosis.  Aortic atherosclerotic calcification. The patient is rotated to the right on today's radiograph, reducing diagnostic sensitivity and specificity. Hazy density at the right lung base, indistinctly marginated. The generalized pulmonary  vascular indistinctness shown on the prior exam has improved. No blunting of the costophrenic angles. IMPRESSION: 1. Vague density at the right lung base, indistinctly marginated, possibly from residual edema but technically nonspecific. I recommend followup chest radiography in 4 weeks time to ensure clearance of this process. If the process has not cleared, CT would be recommended for further workup. 2. Generally the interstitial opacity shown on the prior exam has improved. 3. Atherosclerosis. 4. Thoracic spondylosis. Electronically Signed   By: Gaylyn Rong M.D.   On: 02/13/2016 18:51   Ct Head Wo Contrast  02/13/2016  CLINICAL DATA:  Acute presentation with confusion EXAM: CT HEAD WITHOUT CONTRAST TECHNIQUE: Contiguous axial images were obtained from the base of the skull through the vertex without intravenous contrast. COMPARISON:  None. FINDINGS: The brain shows generalized atrophy. There is advanced chronic appearing small vessel ischemic change throughout the cerebral hemispheric white matter an the pons. No identifiable acute infarction, though an acute insult could be hidden amongst the extensive chronic changes. No large vessel territory infarction. No mass lesion, hemorrhage, hydrocephalus or extra-axial collection. No calvarial abnormality. Sinuses are clear except for a retention cyst in the right maxillary sinus. There is atherosclerotic calcification of the major vessels at the base of the brain. IMPRESSION: No acute finding by CT. Advanced atrophy and chronic small vessel ischemic changes throughout the brain. Certainly, a small acute insult could be hidden amongst the extensive chronic changes. Electronically Signed   By: Paulina Fusi M.D.   On: 02/13/2016 20:31    Microbiology: Recent Results (from the past 240 hour(s))  Urine culture     Status: Abnormal   Collection Time: 02/13/16  8:35 PM  Result Value Ref Range Status   Specimen Description URINE, RANDOM  Final   Special  Requests NONE  Final   Culture >=100,000 COLONIES/mL ESCHERICHIA COLI (A)  Final   Report Status 02/15/2016 FINAL  Final   Organism ID, Bacteria ESCHERICHIA COLI (A)  Final      Susceptibility   Escherichia coli - MIC*    AMPICILLIN 8 SENSITIVE Sensitive     CEFAZOLIN <=4 SENSITIVE Sensitive     CEFTRIAXONE <=1 SENSITIVE Sensitive  CIPROFLOXACIN <=0.25 SENSITIVE Sensitive     GENTAMICIN <=1 SENSITIVE Sensitive     IMIPENEM <=0.25 SENSITIVE Sensitive     NITROFURANTOIN <=16 SENSITIVE Sensitive     TRIMETH/SULFA <=20 SENSITIVE Sensitive     AMPICILLIN/SULBACTAM 4 SENSITIVE Sensitive     PIP/TAZO <=4 SENSITIVE Sensitive     * >=100,000 COLONIES/mL ESCHERICHIA COLI  Culture, blood (Routine x 2)     Status: None (Preliminary result)   Collection Time: 02/13/16  8:45 PM  Result Value Ref Range Status   Specimen Description BLOOD LEFT HAND  Final   Special Requests BOTTLES DRAWN AEROBIC ONLY 5CC  Final   Culture NO GROWTH 3 DAYS  Final   Report Status PENDING  Incomplete  Culture, blood (Routine x 2)     Status: None (Preliminary result)   Collection Time: 02/13/16  8:50 PM  Result Value Ref Range Status   Specimen Description BLOOD LEFT ARM  Final   Special Requests BOTTLES DRAWN AEROBIC ONLY 5CC  Final   Culture NO GROWTH 3 DAYS  Final   Report Status PENDING  Incomplete     Labs: Basic Metabolic Panel:  Recent Labs Lab 02/13/16 1815 02/14/16 0115 02/15/16 0550 02/16/16 1136  NA 137 136 137 138  K 3.8 3.4* 3.6 3.5  CL 95* 98* 100* 104  CO2 26 24 24 23   GLUCOSE 135* 189* 126* 164*  BUN 142* 136* 127* 106*  CREATININE 4.62* 4.35* 3.77* 2.87*  CALCIUM 9.3 8.9 8.7* 8.6*   Liver Function Tests:  Recent Labs Lab 02/13/16 1815 02/14/16 0115  AST 22 21  ALT 13* 12*  ALKPHOS 108 94  BILITOT 0.7 0.3  PROT 5.5* 4.8*  ALBUMIN 2.4* 2.1*   No results for input(s): LIPASE, AMYLASE in the last 168 hours. No results for input(s): AMMONIA in the last 168  hours. CBC:  Recent Labs Lab 02/13/16 1815 02/14/16 0115 02/15/16 0550  WBC 10.2 8.7 6.9  NEUTROABS 8.8*  --   --   HGB 10.0* 8.8* 7.9*  HCT 30.7* 28.3* 24.5*  MCV 90.6 90.7 89.7  PLT 211 194 184   Cardiac Enzymes: No results for input(s): CKTOTAL, CKMB, CKMBINDEX, TROPONINI in the last 168 hours. BNP: BNP (last 3 results)  Recent Labs  09/11/15 0955 11/16/15 1015 02/14/16 0115  BNP 2200.4* 2120.5* 331.7*    ProBNP (last 3 results) No results for input(s): PROBNP in the last 8760 hours.  CBG:  Recent Labs Lab 02/16/16 0734 02/16/16 1203 02/16/16 1623 02/16/16 2112 02/17/16 0747  GLUCAP 125* 155* 163* 212* 117*       Signed:  Takyra Cantrall K  [facility] 02/17/2016, 11:08 AM

## 2016-02-18 LAB — CULTURE, BLOOD (ROUTINE X 2)
CULTURE: NO GROWTH
Culture: NO GROWTH

## 2016-02-23 ENCOUNTER — Encounter (HOSPITAL_COMMUNITY)
Admission: RE | Admit: 2016-02-23 | Discharge: 2016-02-23 | Disposition: A | Discharge status: Home / Self Care | Payer: Medicare Other | Source: Ambulatory Visit | Attending: Nephrology | Admitting: Nephrology

## 2016-02-23 DIAGNOSIS — N183 Chronic kidney disease, stage 3 (moderate): Secondary | ICD-10-CM | POA: Diagnosis not present

## 2016-02-23 DIAGNOSIS — D638 Anemia in other chronic diseases classified elsewhere: Secondary | ICD-10-CM

## 2016-02-23 DIAGNOSIS — D631 Anemia in chronic kidney disease: Secondary | ICD-10-CM | POA: Diagnosis present

## 2016-02-23 LAB — POCT HEMOGLOBIN-HEMACUE: Hemoglobin: 8.3 g/dL — ABNORMAL LOW (ref 13.0–17.0)

## 2016-02-23 MED ORDER — EPOETIN ALFA 40000 UNIT/ML IJ SOLN
INTRAMUSCULAR | Status: AC
  Filled 2016-02-23: qty 1

## 2016-02-23 MED ORDER — EPOETIN ALFA 40000 UNIT/ML IJ SOLN
40000.0000 [IU] | INTRAMUSCULAR | Status: DC
  Administered 2016-02-23: 40000 [IU] via SUBCUTANEOUS

## 2016-03-04 ENCOUNTER — Other Ambulatory Visit: Payer: Self-pay | Admitting: Cardiovascular Disease

## 2016-03-05 NOTE — Telephone Encounter (Signed)
Rx request sent to pharmacy.  

## 2016-03-08 ENCOUNTER — Encounter (HOSPITAL_COMMUNITY): Payer: Medicare Other

## 2016-03-15 ENCOUNTER — Inpatient Hospital Stay (HOSPITAL_COMMUNITY): Admit: 2016-03-15 | Payer: Medicare Other

## 2016-03-21 ENCOUNTER — Encounter: Payer: Self-pay | Admitting: Vascular Surgery

## 2016-03-29 ENCOUNTER — Encounter: Payer: Medicare Other | Admitting: Vascular Surgery

## 2016-03-30 ENCOUNTER — Encounter: Payer: Self-pay | Admitting: Vascular Surgery

## 2016-04-05 ENCOUNTER — Encounter: Payer: Medicare Other | Admitting: Vascular Surgery

## 2016-04-06 ENCOUNTER — Emergency Department (HOSPITAL_COMMUNITY)

## 2016-04-06 ENCOUNTER — Observation Stay (HOSPITAL_COMMUNITY)
Admission: EM | Admit: 2016-04-06 | Discharge: 2016-04-09 | Disposition: A | Discharge status: Home / Self Care | Attending: Family Medicine | Admitting: Family Medicine

## 2016-04-06 ENCOUNTER — Encounter (HOSPITAL_COMMUNITY): Payer: Self-pay | Admitting: *Deleted

## 2016-04-06 DIAGNOSIS — I2510 Atherosclerotic heart disease of native coronary artery without angina pectoris: Secondary | ICD-10-CM | POA: Diagnosis not present

## 2016-04-06 DIAGNOSIS — Z7982 Long term (current) use of aspirin: Secondary | ICD-10-CM | POA: Insufficient documentation

## 2016-04-06 DIAGNOSIS — Z79899 Other long term (current) drug therapy: Secondary | ICD-10-CM | POA: Diagnosis not present

## 2016-04-06 DIAGNOSIS — N183 Chronic kidney disease, stage 3 (moderate): Secondary | ICD-10-CM | POA: Diagnosis not present

## 2016-04-06 DIAGNOSIS — R55 Syncope and collapse: Secondary | ICD-10-CM | POA: Diagnosis not present

## 2016-04-06 DIAGNOSIS — Z87891 Personal history of nicotine dependence: Secondary | ICD-10-CM | POA: Diagnosis not present

## 2016-04-06 DIAGNOSIS — E119 Type 2 diabetes mellitus without complications: Secondary | ICD-10-CM | POA: Insufficient documentation

## 2016-04-06 DIAGNOSIS — D649 Anemia, unspecified: Secondary | ICD-10-CM | POA: Diagnosis not present

## 2016-04-06 DIAGNOSIS — Z7902 Long term (current) use of antithrombotics/antiplatelets: Secondary | ICD-10-CM | POA: Insufficient documentation

## 2016-04-06 DIAGNOSIS — I129 Hypertensive chronic kidney disease with stage 1 through stage 4 chronic kidney disease, or unspecified chronic kidney disease: Secondary | ICD-10-CM | POA: Insufficient documentation

## 2016-04-06 DIAGNOSIS — Z7984 Long term (current) use of oral hypoglycemic drugs: Secondary | ICD-10-CM | POA: Diagnosis not present

## 2016-04-06 LAB — COMPREHENSIVE METABOLIC PANEL
ALT: 10 U/L — ABNORMAL LOW (ref 17–63)
ANION GAP: 8 (ref 5–15)
AST: 20 U/L (ref 15–41)
Albumin: 2.4 g/dL — ABNORMAL LOW (ref 3.5–5.0)
Alkaline Phosphatase: 82 U/L (ref 38–126)
BUN: 63 mg/dL — ABNORMAL HIGH (ref 6–20)
CALCIUM: 8.4 mg/dL — AB (ref 8.9–10.3)
CHLORIDE: 114 mmol/L — AB (ref 101–111)
CO2: 19 mmol/L — AB (ref 22–32)
Creatinine, Ser: 2.45 mg/dL — ABNORMAL HIGH (ref 0.61–1.24)
GFR, EST AFRICAN AMERICAN: 26 mL/min — AB (ref >60)
GFR, EST NON AFRICAN AMERICAN: 23 mL/min — AB (ref >60)
Glucose, Bld: 184 mg/dL — ABNORMAL HIGH (ref 65–99)
Potassium: 3.1 mmol/L — ABNORMAL LOW (ref 3.5–5.1)
SODIUM: 141 mmol/L (ref 135–145)
Total Bilirubin: 0.5 mg/dL (ref 0.3–1.2)
Total Protein: 4.6 g/dL — ABNORMAL LOW (ref 6.5–8.1)

## 2016-04-06 LAB — CBC WITH DIFFERENTIAL/PLATELET
BASOS PCT: 0 %
Basophils Absolute: 0 10*3/uL (ref 0.0–0.1)
Eosinophils Absolute: 0.3 10*3/uL (ref 0.0–0.7)
Eosinophils Relative: 4 %
HEMATOCRIT: 18.5 % — AB (ref 39.0–52.0)
HEMOGLOBIN: 6 g/dL — AB (ref 13.0–17.0)
LYMPHS ABS: 1.5 10*3/uL (ref 0.7–4.0)
Lymphocytes Relative: 18 %
MCH: 30.5 pg (ref 26.0–34.0)
MCHC: 32.4 g/dL (ref 30.0–36.0)
MCV: 93.9 fL (ref 78.0–100.0)
MONOS PCT: 7 %
Monocytes Absolute: 0.5 10*3/uL (ref 0.1–1.0)
NEUTROS ABS: 5.8 10*3/uL (ref 1.7–7.7)
NEUTROS PCT: 71 %
Platelets: 265 10*3/uL (ref 150–400)
RBC: 1.97 MIL/uL — AB (ref 4.22–5.81)
RDW: 19.5 % — ABNORMAL HIGH (ref 11.5–15.5)
WBC: 8.2 10*3/uL (ref 4.0–10.5)

## 2016-04-06 LAB — I-STAT TROPONIN, ED: TROPONIN I, POC: 0.08 ng/mL (ref 0.00–0.08)

## 2016-04-06 LAB — POC OCCULT BLOOD, ED: Fecal Occult Bld: POSITIVE — AB

## 2016-04-06 LAB — PREPARE RBC (CROSSMATCH)

## 2016-04-06 MED ORDER — POTASSIUM CHLORIDE CRYS ER 20 MEQ PO TBCR
20.0000 meq | EXTENDED_RELEASE_TABLET | Freq: Two times a day (BID) | ORAL | Status: DC
  Administered 2016-04-07: 20 meq via ORAL
  Filled 2016-04-06: qty 1

## 2016-04-06 MED ORDER — ENOXAPARIN SODIUM 30 MG/0.3ML ~~LOC~~ SOLN
30.0000 mg | Freq: Every day | SUBCUTANEOUS | Status: DC
  Administered 2016-04-07 – 2016-04-09 (×3): 30 mg via SUBCUTANEOUS
  Filled 2016-04-06 (×3): qty 0.3

## 2016-04-06 MED ORDER — ALLOPURINOL 100 MG PO TABS
100.0000 mg | ORAL_TABLET | Freq: Every day | ORAL | Status: DC
  Administered 2016-04-07 – 2016-04-08 (×2): 100 mg via ORAL
  Filled 2016-04-06 (×2): qty 1

## 2016-04-06 MED ORDER — CLOPIDOGREL BISULFATE 75 MG PO TABS
75.0000 mg | ORAL_TABLET | Freq: Every day | ORAL | Status: DC
  Administered 2016-04-07 – 2016-04-09 (×3): 75 mg via ORAL
  Filled 2016-04-06 (×3): qty 1

## 2016-04-06 MED ORDER — METOLAZONE 10 MG PO TABS
10.0000 mg | ORAL_TABLET | Freq: Every day | ORAL | Status: DC
  Administered 2016-04-08 – 2016-04-09 (×2): 10 mg via ORAL
  Filled 2016-04-06 (×4): qty 1

## 2016-04-06 MED ORDER — FERROUS SULFATE 325 (65 FE) MG PO TABS
325.0000 mg | ORAL_TABLET | Freq: Two times a day (BID) | ORAL | Status: DC
  Administered 2016-04-07 – 2016-04-08 (×3): 325 mg via ORAL
  Filled 2016-04-06 (×3): qty 1

## 2016-04-06 MED ORDER — INSULIN ASPART 100 UNIT/ML ~~LOC~~ SOLN
0.0000 [IU] | Freq: Three times a day (TID) | SUBCUTANEOUS | Status: DC
  Administered 2016-04-07: 1 [IU] via SUBCUTANEOUS
  Administered 2016-04-07: 3 [IU] via SUBCUTANEOUS
  Administered 2016-04-08: 2 [IU] via SUBCUTANEOUS
  Administered 2016-04-08 (×2): 1 [IU] via SUBCUTANEOUS
  Administered 2016-04-09: 2 [IU] via SUBCUTANEOUS
  Administered 2016-04-09: 1 [IU] via SUBCUTANEOUS
  Administered 2016-04-09: 2 [IU] via SUBCUTANEOUS

## 2016-04-06 MED ORDER — CALCITRIOL 0.5 MCG PO CAPS
0.5000 ug | ORAL_CAPSULE | Freq: Every day | ORAL | Status: DC
  Administered 2016-04-07 – 2016-04-09 (×3): 0.5 ug via ORAL
  Filled 2016-04-06 (×3): qty 1

## 2016-04-06 MED ORDER — ATORVASTATIN CALCIUM 40 MG PO TABS
40.0000 mg | ORAL_TABLET | Freq: Every day | ORAL | Status: DC
  Administered 2016-04-07 – 2016-04-09 (×3): 40 mg via ORAL
  Filled 2016-04-06 (×3): qty 1

## 2016-04-06 MED ORDER — FUROSEMIDE 80 MG PO TABS
240.0000 mg | ORAL_TABLET | Freq: Two times a day (BID) | ORAL | Status: DC
  Administered 2016-04-07 – 2016-04-09 (×6): 240 mg via ORAL
  Filled 2016-04-06 (×6): qty 3

## 2016-04-06 MED ORDER — AMLODIPINE BESYLATE 5 MG PO TABS
5.0000 mg | ORAL_TABLET | Freq: Every day | ORAL | Status: DC
  Administered 2016-04-07 – 2016-04-09 (×3): 5 mg via ORAL
  Filled 2016-04-06 (×3): qty 1

## 2016-04-06 MED ORDER — SODIUM CHLORIDE 0.9 % IV SOLN
10.0000 mL/h | Freq: Once | INTRAVENOUS | Status: AC
  Administered 2016-04-06: 10 mL/h via INTRAVENOUS

## 2016-04-06 MED ORDER — ASPIRIN EC 81 MG PO TBEC
81.0000 mg | DELAYED_RELEASE_TABLET | Freq: Every evening | ORAL | Status: DC
  Administered 2016-04-07 – 2016-04-09 (×3): 81 mg via ORAL
  Filled 2016-04-06 (×3): qty 1

## 2016-04-06 MED ORDER — ALLOPURINOL 100 MG PO TABS
300.0000 mg | ORAL_TABLET | Freq: Every day | ORAL | Status: DC
  Administered 2016-04-07 – 2016-04-09 (×3): 300 mg via ORAL
  Filled 2016-04-06 (×3): qty 3

## 2016-04-06 MED ORDER — LANTHANUM CARBONATE 500 MG PO CHEW
1000.0000 mg | CHEWABLE_TABLET | Freq: Two times a day (BID) | ORAL | Status: DC
  Administered 2016-04-07 – 2016-04-09 (×6): 1000 mg via ORAL
  Filled 2016-04-06 (×6): qty 2

## 2016-04-06 MED ORDER — ISOSORBIDE MONONITRATE ER 60 MG PO TB24
60.0000 mg | ORAL_TABLET | Freq: Every day | ORAL | Status: DC
  Administered 2016-04-07 – 2016-04-09 (×3): 60 mg via ORAL
  Filled 2016-04-06 (×3): qty 1

## 2016-04-06 MED ORDER — PANTOPRAZOLE SODIUM 40 MG PO TBEC
40.0000 mg | DELAYED_RELEASE_TABLET | Freq: Every day | ORAL | Status: DC
  Administered 2016-04-07 – 2016-04-08 (×2): 40 mg via ORAL
  Filled 2016-04-06 (×2): qty 1

## 2016-04-06 MED ORDER — BOOST / RESOURCE BREEZE PO LIQD
1.0000 | Freq: Three times a day (TID) | ORAL | Status: DC
  Administered 2016-04-07 – 2016-04-09 (×8): 1 via ORAL

## 2016-04-06 MED ORDER — METOPROLOL SUCCINATE ER 25 MG PO TB24
50.0000 mg | ORAL_TABLET | Freq: Every day | ORAL | Status: DC
  Administered 2016-04-07 – 2016-04-09 (×3): 50 mg via ORAL
  Filled 2016-04-06 (×3): qty 2

## 2016-04-06 NOTE — ED Notes (Signed)
Dr. Rees at bedside  

## 2016-04-06 NOTE — ED Notes (Signed)
Spoke to Dr.Hamad concerning blood pressure, pt to continue home medications for blood pressure control

## 2016-04-06 NOTE — ED Notes (Signed)
Pt signed consent for blood transfusion.

## 2016-04-06 NOTE — ED Notes (Signed)
Patient transported to CT 

## 2016-04-06 NOTE — H&P (Signed)
History and Physical  [name] ZOX:[id number] DOB: [birth year] DOA: 04/06/2016  PCP:  Lupita Raider, MD   Chief Complaint:  Syncope   History of Present Illness:  Patient is a 82 yo male with history of HTN, CKD, lewy body dementia, DMII, PVD who was brought with cc of syncope. The patient does not remember exactly what happened. The fall was unwitnessed. He denied having any dizziness, chest pain , dyspnea, palpitations, focal weakness, focal sensory complaints, seizures, incontinence. He had a similar fall 6 months ago. Otherwise he has no complaints and no pain.   Review of Systems:  CONSTITUTIONAL:     No night sweats.  No fatigue.  No fever. No chills. Eyes:                            No visual changes.  No eye pain.  No eye discharge.   ENT:                              No epistaxis.  No sinus pain.  No sore throat.   No congestion. RESPIRATORY:           No cough.  No wheeze.  No hemoptysis.  No dyspnea CARDIOVASCULAR   :  No chest pains.  No palpitations. GASTROINTESTINAL:  No abdominal pain.  No nausea. No vomiting.  No diarrhea. No constipation.  No hematemesis.  No hematochezia.  No melena. GENITOURINARY:      No urgency.  No frequency.  No dysuria.  No hematuria.  No obstructive symptoms.  No discharge.  No pain.   MUSCULOSKELETAL:  No musculoskeletal pain.  No joint swelling.  No arthritis. NEUROLOGICAL:        No confusion.  No weakness. No headache. No seizure. PSYCHIATRIC:             No depression. No anxiety. No suicidal ideation. SKIN:                             No rashes.  No lesions.  No wounds. ENDOCRINE:                No weight loss.  No polydipsia.  No polyuria.  No polyphagia. HEMATOLOGIC:           No purpura.  No petechiae.  No bleeding.  ALLERGIC                 : No pruritus.  No angioedema Other:  Past Medical and Surgical History:   Past Medical History  Diagnosis Date  . Diabetes mellitus   . Hypertension   . Hypercholesteremia   .  Gout   . PVD (peripheral vascular disease) (HCC) 7/05    LCE, known 60% RICA 7/12  . CAD (coronary artery disease) 2002    non critical  . Macular degeneration   . Chronic renal disease, stage III   . Claudication (HCC)   . Palpitations     PVCs and PSVT on Holter monitoring  . Bilateral carotid artery disease (HCC)     status post left carotid endarterectomy performed by Dr. Madilyn Fireman 05/12/04  . Anemia    Past Surgical History  Procedure Laterality Date  . 2d echocardiogram  03/31/2008    EF greater than 55%  . Cardiovascular stress test  06/30/2010    Nonischemic. Low risk  .  Cerebral angiogram  04/03/2004    High-grade 80% ostial L ICA stenosis. Medical management.  . Abdominal aortogram  09/01/2007    Widely patent renal arteries. Medical management.  . Peripheral vascular angiogram  05/07/2011    No evidence of intracranial occlusions, stenosis, dissections, or aneurysms seen  . Carotid doppler  06/09/2012    R Vertebral-known occluded vessel, R Bulb/Proximal ICA-moderate to severe amount of plaque w/50-69% diameter reduction, L Subclavian-50-69% diameter reduction, L CEA-demonstrated increased velocities w/o evidence of hemodynamically significant stenosis  . Cardiac catheterization  10/17/2001    No significant CAD, normal LV systolic function.  . Carotid endarterectomy Left July 2005  . Lower extremity angiogram Bilateral 11/01/2014    Procedure: LOWER EXTREMITY ANGIOGRAM;  Surgeon: [name], MD;  Location: [facility] CATH LAB;  Service: Cardiovascular;  Laterality: Bilateral;  . Abdominal angiogram  11/01/2014    Procedure: ABDOMINAL ANGIOGRAM;  Surgeon: [name], MD;  Location: [facility] CATH LAB;  Service: Cardiovascular;;  . Knee surgery    . Hernia repair    . Cardiac catheterization N/A 11/16/2015    Procedure: Left Heart Cath and Coronary Angiography;  Surgeon: Yates Decamp, MD;  Location: [facility] INVASIVE CV LAB;  Service: Cardiovascular;  Laterality: N/A;  . Cardiac catheterization  Right 11/16/2015    Procedure: Intravascular Ultrasound/IVUS;  Surgeon: Yates Decamp, MD;  Location: [facility] INVASIVE CV LAB;  Service: Cardiovascular;  Laterality: Right;  . Insertion of dialysis catheter Right 11/23/2015    Procedure: INSERTION OF DIALYSIS CATHETER RIGHT INTERNAL JUGULAR;  Surgeon: Sherren Kerns, MD;  Location: [facility] OR;  Service: Vascular;  Laterality: Right;  . Av fistula placement Right 01/25/2016    Procedure: BRACHIOCEPHALIC ARTERIOVENOUS (AV) FISTULA CREATION;  Surgeon: Sherren Kerns, MD;  Location: [facility] OR;  Service: Vascular;  Laterality: Right;    Social History:   reports that he has quit smoking. He has never used smokeless tobacco. He reports that he does not drink alcohol or use illicit drugs.    Allergies  Allergen Reactions  . Labetalol Swelling    angioedema    Family History  Problem Relation Age of Onset  . Stroke Sister   . Heart disease Brother   . Diabetes Neg Hx     Prior to Admission medications   Medication Sig Start Date End Date Taking? Authorizing Provider  ACCU-CHEK FASTCLIX LANCETS MISC Use to check blood sugar 2 times per day dx code E11.9 07/20/15  Yes Reather Littler, MD  allopurinol (ZYLOPRIM) 100 MG tablet Take 100 mg by mouth at bedtime. 03/21/16  Yes Historical Provider, MD  allopurinol (ZYLOPRIM) 300 MG tablet Take 300 mg by mouth every morning.  01/24/16  Yes Historical Provider, MD  amLODipine (NORVASC) 10 MG tablet Take 0.5 tablets (5 mg total) by mouth daily. Patient taking differently: Take 5 mg by mouth every morning.  11/26/15  Yes Elease Etienne, MD  aspirin EC 81 MG tablet Take 81 mg by mouth every evening.   Yes Historical Provider, MD  atorvastatin (LIPITOR) 40 MG tablet Take 40 mg by mouth every morning.  03/01/16  Yes Historical Provider, MD  calcitRIOL (ROCALTROL) 0.25 MCG capsule Take 2 capsules (0.5 mcg total) by mouth daily. 11/26/15  Yes Elease Etienne, MD  camphor-menthol [facility]) lotion Apply topically as needed for itching.  02/17/16  Yes Hollice Espy, MD  clopidogrel (PLAVIX) 75 MG tablet Take 1 tablet (75 mg total) by mouth daily. Please schedule appointment for refills. 03/05/16  Yes Christiane Ha  Erlene Quan, MD  feeding supplement (BOOST / RESOURCE BREEZE) LIQD Take 1 Container by mouth 3 (three) times daily between meals.   Yes Historical Provider, MD  ferrous sulfate 325 (65 FE) MG tablet Take 325 mg by mouth 2 (two) times daily with a meal.   Yes Historical Provider, MD  furosemide (LASIX) 80 MG tablet Take 240 mg by mouth 2 (two) times daily.  01/24/16  Yes Historical Provider, MD  glucose blood (ACCU-CHEK AVIVA PLUS) test strip Use as instructed to check blood sugar 2 times per day dx code E11.9 06/01/15  Yes Reather Littler, MD  isosorbide mononitrate (IMDUR) 60 MG 24 hr tablet Take 1 tablet (60 mg total) by mouth daily. 09/23/15  Yes Catarina Hartshorn, MD  JANUVIA 25 MG tablet TAKE 1 TABLET BY MOUTH DAILY. 12/11/15  Yes Reather Littler, MD  lanthanum (FOSRENOL) 1000 MG chewable tablet Chew 1 tablet (1,000 mg total) by mouth 3 (three) times daily with meals. Patient taking differently: Chew 1,000 mg by mouth 2 (two) times daily with a meal.  11/26/15  Yes Elease Etienne, MD  metolazone (ZAROXOLYN) 10 MG tablet Take 10 mg by mouth every morning. 12/30/15  Yes Historical Provider, MD  metoprolol succinate (TOPROL-XL) 50 MG 24 hr tablet Take 1 tablet (50 mg total) by mouth daily. Take with or immediately following a meal. 09/24/15  Yes Catarina Hartshorn, MD  Multiple Vitamins-Minerals (PRESERVISION AREDS PO) Take 1 tablet by mouth 2 (two) times daily.    Yes Historical Provider, MD  pantoprazole (PROTONIX) 40 MG tablet Take 40 mg by mouth at bedtime. 03/15/16  Yes Historical Provider, MD  polyethylene glycol (MIRALAX / GLYCOLAX) packet Take 17 g by mouth daily as needed. constipation 11/15/15  Yes Historical Provider, MD  Potassium Chloride ER 20 MEQ TBCR Take 20 mEq by mouth 2 (two) times daily.  02/02/16  Yes Historical Provider, MD    Physical  Exam: BP 180/52 mmHg  Pulse 81  Temp(Src) 98.1 F (36.7 C) (Oral)  Resp 15  SpO2 98%  GENERAL :   Alert and cooperative, and appears to be in no acute distress. HEAD:           normocephalic. EYES:            PERRL, EOMI.   EARS:           hearing grossly intact. NOSE:           No nasal discharge. THROAT:     Oral cavity and pharynx normal.   NECK:          supple, non-tender.  CARDIAC:    Normal S1 and S2. No gallop. No murmurs.  Vascular:     no peripheral edema.  LUNGS:       Clear to auscultation  ABDOMEN: Positive bowel sounds. Soft, nondistended, nontender. No guarding or rebound.      MSK:           No joint erythema or tenderness. EXT           : No significant deformity or joint abnormality. Neuro        : Alert, oriented                       CN II-XII intact.                       Strength and sensation symmetric and intact throughout.                      Marland Kitchen  SKIN:            No rash. No lesions. PSYCH:       No hallucination. Patient is not suicidal.          Labs on Admission:  Reviewed.   Radiological Exams on Admission: Dg Chest 2 View  04/06/2016  CLINICAL DATA:  Syncope and head injury today EXAM: CHEST  2 VIEW COMPARISON:  02/13/2016 FINDINGS: Mild to moderate cardiac enlargement similar to prior study. Vascular pattern normal. No consolidation effusion or pneumothorax. No persistent abnormal opacity at the right lung base. IMPRESSION: No acute findings Electronically Signed   By: Esperanza Heiraymond  Rubner M.D.   On: 04/06/2016 20:10   Ct Head Wo Contrast  04/06/2016  CLINICAL DATA:  Syncope.  Head injury. EXAM: CT HEAD WITHOUT CONTRAST CT CERVICAL SPINE WITHOUT CONTRAST TECHNIQUE: Multidetector CT imaging of the head and cervical spine was performed following the standard protocol without intravenous contrast. Multiplanar CT image reconstructions of the cervical spine were also generated. COMPARISON:  February 13, 2016 FINDINGS: CT HEAD FINDINGS There is a mucous retention  cyst or polyp in the right maxillary sinus. Mastoid air cells or paranasal sinuses, and middle ears are otherwise normal. No subdural, epidural, or subarachnoid hemorrhage. Cerebellum, brainstem, and basal cisterns are normal. Ventricles and sulci are prominent but stable. Moderate to severe white matter changes are stable as well. 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 No acute cortical ischemia or infarct. No mass, mass effect, or midline shift. CT CERVICAL SPINE FINDINGS There is straightening of normal lordosis. No other malalignment. No fractures. Degenerative changes are seen most marked at C6-7. Chronic calcifications are seen in the neck, right greater than left. There is a nodule in the right lung apex which is incompletely characterized, measuring 5 mm. IMPRESSION: 1. No acute intracranial abnormality. 2. No cervical spine fracture or malalignment. 3. 5mm nodule in the right upper lobe of the lungs, incompletely characterized. A CT of the chest could better characterize. Electronically Signed   By: Gerome Samavid  Williams III M.D   On: 04/06/2016 20:17   Ct Cervical Spine Wo Contrast  04/06/2016  CLINICAL DATA:  Syncope.  Head injury. EXAM: CT HEAD WITHOUT CONTRAST CT CERVICAL SPINE WITHOUT CONTRAST TECHNIQUE: Multidetector CT imaging of the head and cervical spine was performed following the standard protocol without intravenous contrast. Multiplanar CT image reconstructions of the cervical spine were also generated. COMPARISON:  February 13, 2016 FINDINGS: CT HEAD FINDINGS There is a mucous retention cyst or polyp in the right maxillary sinus. Mastoid air cells or paranasal sinuses, and middle ears are otherwise normal. No subdural, epidural, or subarachnoid hemorrhage. Cerebellum, brainstem, and basal cisterns are normal. Ventricles and sulci are prominent but stable. Moderate to severe white matter changes are stable as well. There is a lacunar  infarct in the right corona radiata extending into the basal ganglia, also stable. No acute cortical ischemia or infarct. No mass, mass effect, or midline shift. CT CERVICAL SPINE FINDINGS There is straightening of normal lordosis. No other malalignment. No fractures. Degenerative changes are seen most marked at C6-7. Chronic calcifications are seen in the neck, right greater than left. There is a nodule in the right lung apex which is incompletely characterized, measuring 5 mm. IMPRESSION: 1. No acute intracranial abnormality. 2. No cervical spine fracture or malalignment. 3. 5mm nodule in the right upper lobe of the lungs, incompletely characterized. A CT of the chest  could better characterize. Electronically Signed   By: Gerome Sam III M.D   On: 04/06/2016 20:17    EKG:  Independently reviewed. Sinus with PVCs.  Assessment/Plan  Syncope:  DDx:  - Cardiac : structural heart disease ( AS, MS) or arrhythmia ( tachy or brady) - syncope after changing position suggest orthostatic hypotension. - Neurologic : seizure vs. Stroke vs. TIA - Vasovagal : triggered by stress. - multifactorial including anemia.  I discussed with the family what are their wishes. Both patient and his son expressed that they want him to be treated but no further testing including Echo, imaging or cardiac tests. They want to "give him blood" as he had a blood transfusion for anemia in Jan as well. I saw that the patient is on home hospice but they are not aware if that means he does not want to be hospitalized. ER started blood transfusion. I will resume blood transfusion and continue home medication. I will not do any further testing per family wishes. Palliative care to see patient in am to discuss hospice care.   HTN: cont home meds  DMII: place on low dose correction of insulin.   Hypokalemia: continue KCL 20 mq BID and monitor.  CKD: cr at baseline.   Anemia:likely due to CKD. FOBT is pos. Patient denied  hematochezia or melena. They don't want any invasive testing including colonoscopy. Will discuss in am with palliative. We will continue with blood transfusion of 1 PRBC.   Input & Output: NA Lines & Tubes: PIV DVT prophylaxis:  [place] enoxaparin  GI prophylaxis:PPI Consultants: Palliative in am Code Status: DNR/DNI Family Communication: son at bedside. Daughter is POA: not available.  Disposition Plan: admit to obs.     Eston Esters M.D [facility]

## 2016-04-06 NOTE — ED Notes (Signed)
Pt given sandwich and Coke per admitting MD

## 2016-04-06 NOTE — ED Notes (Signed)
Pt to ED from home by ems c/o fall. Pt reports walking out into garage, +LOC, and woke up on the floor. Pt unable to recall events leading up to syncope. Has a hx of afib

## 2016-04-06 NOTE — ED Notes (Signed)
Attempted report 

## 2016-04-06 NOTE — ED Notes (Signed)
Pt c/o shortness of breath; no change in vital signs. Pt placed on oxygen 2L,[place]; blood transfusion slowed to 18600ml/hr

## 2016-04-07 DIAGNOSIS — D649 Anemia, unspecified: Secondary | ICD-10-CM | POA: Diagnosis not present

## 2016-04-07 DIAGNOSIS — E876 Hypokalemia: Secondary | ICD-10-CM | POA: Diagnosis not present

## 2016-04-07 DIAGNOSIS — R55 Syncope and collapse: Secondary | ICD-10-CM | POA: Diagnosis not present

## 2016-04-07 LAB — CBC WITH DIFFERENTIAL/PLATELET
BASOS ABS: 0 10*3/uL (ref 0.0–0.1)
BASOS PCT: 0 %
EOS PCT: 2 %
Eosinophils Absolute: 0.3 10*3/uL (ref 0.0–0.7)
HCT: 22.4 % — ABNORMAL LOW (ref 39.0–52.0)
Hemoglobin: 7.4 g/dL — ABNORMAL LOW (ref 13.0–17.0)
LYMPHS PCT: 18 %
Lymphs Abs: 1.8 10*3/uL (ref 0.7–4.0)
MCH: 30.2 pg (ref 26.0–34.0)
MCHC: 33 g/dL (ref 30.0–36.0)
MCV: 91.4 fL (ref 78.0–100.0)
MONO ABS: 0.8 10*3/uL (ref 0.1–1.0)
Monocytes Relative: 8 %
Neutro Abs: 7.5 10*3/uL (ref 1.7–7.7)
Neutrophils Relative %: 72 %
PLATELETS: 236 10*3/uL (ref 150–400)
RBC: 2.45 MIL/uL — AB (ref 4.22–5.81)
RDW: 19.7 % — AB (ref 11.5–15.5)
WBC: 10.4 10*3/uL (ref 4.0–10.5)

## 2016-04-07 LAB — COMPREHENSIVE METABOLIC PANEL
ALBUMIN: 2.3 g/dL — AB (ref 3.5–5.0)
ALT: 12 U/L — AB (ref 17–63)
AST: 18 U/L (ref 15–41)
Alkaline Phosphatase: 77 U/L (ref 38–126)
Anion gap: 9 (ref 5–15)
BUN: 58 mg/dL — AB (ref 6–20)
CHLORIDE: 114 mmol/L — AB (ref 101–111)
CO2: 22 mmol/L (ref 22–32)
CREATININE: 2.24 mg/dL — AB (ref 0.61–1.24)
Calcium: 8.7 mg/dL — ABNORMAL LOW (ref 8.9–10.3)
GFR calc Af Amer: 29 mL/min — ABNORMAL LOW (ref >60)
GFR calc non Af Amer: 25 mL/min — ABNORMAL LOW (ref >60)
Glucose, Bld: 114 mg/dL — ABNORMAL HIGH (ref 65–99)
POTASSIUM: 2.9 mmol/L — AB (ref 3.5–5.1)
SODIUM: 145 mmol/L (ref 135–145)
Total Bilirubin: 0.6 mg/dL (ref 0.3–1.2)
Total Protein: 4.5 g/dL — ABNORMAL LOW (ref 6.5–8.1)

## 2016-04-07 LAB — GLUCOSE, CAPILLARY
GLUCOSE-CAPILLARY: 136 mg/dL — AB (ref 65–99)
GLUCOSE-CAPILLARY: 225 mg/dL — AB (ref 65–99)
GLUCOSE-CAPILLARY: 139 mg/dL — AB (ref 65–99)
Glucose-Capillary: 113 mg/dL — ABNORMAL HIGH (ref 65–99)

## 2016-04-07 LAB — HEMOGLOBIN AND HEMATOCRIT, BLOOD
HCT: 27.8 % — ABNORMAL LOW (ref 39.0–52.0)
Hemoglobin: 9 g/dL — ABNORMAL LOW (ref 13.0–17.0)

## 2016-04-07 LAB — PREPARE RBC (CROSSMATCH)

## 2016-04-07 MED ORDER — SODIUM CHLORIDE 0.9 % IV SOLN
Freq: Once | INTRAVENOUS | Status: AC
  Administered 2016-04-07: 12:00:00 via INTRAVENOUS

## 2016-04-07 MED ORDER — POTASSIUM CHLORIDE CRYS ER 20 MEQ PO TBCR
40.0000 meq | EXTENDED_RELEASE_TABLET | Freq: Once | ORAL | Status: AC
  Administered 2016-04-07: 40 meq via ORAL
  Filled 2016-04-07: qty 2

## 2016-04-07 MED ORDER — POLYETHYLENE GLYCOL 3350 17 G PO PACK
17.0000 g | PACK | Freq: Every day | ORAL | Status: DC
  Administered 2016-04-08 – 2016-04-09 (×2): 17 g via ORAL
  Filled 2016-04-07 (×2): qty 1

## 2016-04-07 MED ORDER — HYDRALAZINE HCL 20 MG/ML IJ SOLN
10.0000 mg | Freq: Once | INTRAMUSCULAR | Status: AC
  Administered 2016-04-07: 10 mg via INTRAVENOUS
  Filled 2016-04-07: qty 1

## 2016-04-07 NOTE — Progress Notes (Signed)
Paged Dr. Cena BentonVega, pt bp 189/52hr 78, pt had norvasc 5 mg, metorprolol 50, imdur 60 @ 1020.  Pt receiving blood at this time. Does he want to give additional.

## 2016-04-07 NOTE — Progress Notes (Signed)
Pt had 9 beat run V-tach.  Paged Dr. Maryruth HancockVega FYI

## 2016-04-07 NOTE — Progress Notes (Signed)
Lab called confused on blood order.  Had 1 unit transfused last night.  Have 1 holding.  Order another.  Did he want 1 additional unit ordered transfused or 2.

## 2016-04-07 NOTE — Progress Notes (Signed)
2nd unit PRBC's complete. VSS bp 162/87 hr 75 Temp 97.6 O2 sat 99% RA.  Ordered H&H

## 2016-04-07 NOTE — Progress Notes (Signed)
PROGRESS NOTE    [name]  WJX:[id number] DOB: Jul 25, 1930 DOA: 04/06/2016 PCP: Lupita Raider, MD    Brief Narrative:  82 yo male with history of HTN, CKD, lewy body dementia, DMII, PVD who was brought with cc of syncope. The patient does not remember exactly what happened. The fall was unwitnessed. He denied having any dizziness, chest pain , dyspnea, palpitations, focal weakness, focal sensory complaints, seizures, incontinence. He had a similar fall 6 months ago. Otherwise he has no complaints and no pain.    Assessment & Plan:   Active Problems:   Syncope - resolved. No work up requested by family, Please see H and P - improved after transfusion  Anemia - transfuse another unit of blood.   Hypokalemia - replace and reassess   DVT prophylaxis: Lovenox Code Status:  DNR Family Communication: d/c patient directly Disposition Plan: will reassess hgb levels next am. If stable will d/c   Consultants:   None   Procedures:  None   Antimicrobials:  None  Subjective: Pt has no new complaints. He is interested in going home soon.  Objective: Filed Vitals:   04/07/16 0811 04/07/16 1145 04/07/16 1218 04/07/16 1540  BP: 182/53 154/96 189/52 162/87  Pulse: 99 77 78 75  Temp: 98.1 F (36.7 C) 97.8 F (36.6 C) 97.6 F (36.4 C) 97.6 F (36.4 C)  TempSrc: Oral Oral Oral Oral  Resp: 18 16 16    Height:      Weight:      SpO2: 100% 99% 99% 99%    Intake/Output Summary (Last 24 hours) at 04/07/16 1727 Last data filed at 04/07/16 1346  Gross per 24 hour  Intake   1174 ml  Output    550 ml  Net    624 ml   Filed Weights   04/07/16 0053 04/07/16 0559  Weight: 65.9 kg (145 lb 4.5 oz) 64.864 kg (143 lb)    Examination:  General exam: Appears calm and comfortable  Respiratory system: Clear to auscultation. Respiratory effort normal. Cardiovascular system: S1 & S2 heard, RRR.  Gastrointestinal system: Abdomen is nondistended, soft and nontender. No organomegaly  or masses felt. Normal bowel sounds heard. Central nervous system: Alert and oriented. No focal neurological deficits. Extremities: Symmetric 5 x 5 power. Skin: warm and pink Psychiatry:  Mood & affect appropriate.     Data Reviewed: I have personally reviewed following labs and imaging studies  CBC:  Recent Labs Lab 04/06/16 1939 04/07/16 0458  WBC 8.2 10.4  NEUTROABS 5.8 7.5  HGB 6.0* 7.4*  HCT 18.5* 22.4*  MCV 93.9 91.4  PLT 265 236   Basic Metabolic Panel:  Recent Labs Lab 04/06/16 1939 04/07/16 0458  NA 141 145  K 3.1* 2.9*  CL 114* 114*  CO2 19* 22  GLUCOSE 184* 114*  BUN 63* 58*  CREATININE 2.45* 2.24*  CALCIUM 8.4* 8.7*   GFR: Estimated Creatinine Clearance: 22.9 mL/min (by C-G formula based on Cr of 2.24). Liver Function Tests:  Recent Labs Lab 04/06/16 1939 04/07/16 0458  AST 20 18  ALT 10* 12*  ALKPHOS 82 77  BILITOT 0.5 0.6  PROT 4.6* 4.5*  ALBUMIN 2.4* 2.3*   No results for input(s): LIPASE, AMYLASE in the last 168 hours. No results for input(s): AMMONIA in the last 168 hours. Coagulation Profile: No results for input(s): INR, PROTIME in the last 168 hours. Cardiac Enzymes: No results for input(s): CKTOTAL, CKMB, CKMBINDEX, TROPONINI in the last 168 hours. BNP (last 3 results) No  results for input(s): PROBNP in the last 8760 hours. HbA1C: No results for input(s): HGBA1C in the last 72 hours. CBG:  Recent Labs Lab 04/07/16 0753 04/07/16 1155  GLUCAP 136* 225*   Lipid Profile: No results for input(s): CHOL, HDL, LDLCALC, TRIG, CHOLHDL, LDLDIRECT in the last 72 hours. Thyroid Function Tests: No results for input(s): TSH, T4TOTAL, FREET4, T3FREE, THYROIDAB in the last 72 hours. Anemia Panel: No results for input(s): VITAMINB12, FOLATE, FERRITIN, TIBC, IRON, RETICCTPCT in the last 72 hours. Sepsis Labs: No results for input(s): PROCALCITON, LATICACIDVEN in the last 168 hours.  No results found for this or any previous visit (from  the past 240 hour(s)).       Radiology Studies: Dg Chest 2 View  04/06/2016  CLINICAL DATA:  Syncope and head injury today EXAM: CHEST  2 VIEW COMPARISON:  02/13/2016 FINDINGS: Mild to moderate cardiac enlargement similar to prior study. Vascular pattern normal. No consolidation effusion or pneumothorax. No persistent abnormal opacity at the right lung base. IMPRESSION: No acute findings Electronically Signed   By: Esperanza Heiraymond  Rubner M.D.   On: 04/06/2016 20:10   Ct Head Wo Contrast  04/06/2016  CLINICAL DATA:  Syncope.  Head injury. EXAM: CT HEAD WITHOUT CONTRAST CT CERVICAL SPINE WITHOUT CONTRAST TECHNIQUE: Multidetector CT imaging of the head and cervical spine was performed following the standard protocol without intravenous contrast. Multiplanar CT image reconstructions of the cervical spine were also generated. COMPARISON:  February 13, 2016 FINDINGS: CT HEAD FINDINGS There is a mucous retention cyst or polyp in the right maxillary sinus. Mastoid air cells or paranasal sinuses, and middle ears are otherwise normal. No subdural, epidural, or subarachnoid hemorrhage. Cerebellum, brainstem, and basal cisterns are normal. Ventricles and sulci are prominent but stable. Moderate to severe white matter changes are stable as well. 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 No acute cortical ischemia or infarct. No mass, mass effect, or midline shift. CT CERVICAL SPINE FINDINGS There is straightening of normal lordosis. No other malalignment. No fractures. Degenerative changes are seen most marked at C6-7. Chronic calcifications are seen in the neck, right greater than left. There is a nodule in the right lung apex which is incompletely characterized, measuring 5 mm. IMPRESSION: 1. No acute intracranial abnormality. 2. No cervical spine fracture or malalignment. 3. 5mm nodule in the right upper lobe of the lungs, incompletely characterized. A CT of the chest  could better characterize. Electronically Signed   By: Gerome Samavid  Williams III M.D   On: 04/06/2016 20:17   Ct Cervical Spine Wo Contrast  04/06/2016  CLINICAL DATA:  Syncope.  Head injury. EXAM: CT HEAD WITHOUT CONTRAST CT CERVICAL SPINE WITHOUT CONTRAST TECHNIQUE: Multidetector CT imaging of the head and cervical spine was performed following the standard protocol without intravenous contrast. Multiplanar CT image reconstructions of the cervical spine were also generated. COMPARISON:  February 13, 2016 FINDINGS: CT HEAD FINDINGS There is a mucous retention cyst or polyp in the right maxillary sinus. Mastoid air cells or paranasal sinuses, and middle ears are otherwise normal. No subdural, epidural, or subarachnoid hemorrhage. Cerebellum, brainstem, and basal cisterns are normal. Ventricles and sulci are prominent but stable. Moderate to severe white matter changes are stable as well. 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 No acute cortical ischemia or infarct. No mass, mass effect, or midline shift. CT CERVICAL SPINE FINDINGS There is straightening of  normal lordosis. No other malalignment. No fractures. Degenerative changes are seen most marked at C6-7. Chronic calcifications are seen in the neck, right greater than left. There is a nodule in the right lung apex which is incompletely characterized, measuring 5 mm. IMPRESSION: 1. No acute intracranial abnormality. 2. No cervical spine fracture or malalignment. 3. 5mm nodule in the right upper lobe of the lungs, incompletely characterized. A CT of the chest could better characterize. Electronically Signed   By: Gerome Sam III M.D   On: 04/06/2016 20:17        Scheduled Meds: . allopurinol  100 mg Oral QHS  . allopurinol  300 mg Oral Daily  . amLODipine  5 mg Oral Daily  . aspirin EC  81 mg Oral QPM  . atorvastatin  40 mg Oral Daily  . calcitRIOL  0.5 mcg Oral Daily  . clopidogrel  75 mg  Oral Daily  . enoxaparin (LOVENOX) injection  30 mg Subcutaneous Daily  . feeding supplement  1 Container Oral TID BM  . ferrous sulfate  325 mg Oral BID WC  . furosemide  240 mg Oral BID  . insulin aspart  0-9 Units Subcutaneous TID WC  . isosorbide mononitrate  60 mg Oral Daily  . lanthanum  1,000 mg Oral BID WC  . metolazone  10 mg Oral Q breakfast  . metoprolol succinate  50 mg Oral Daily  . pantoprazole  40 mg Oral QHS  . potassium chloride SA  20 mEq Oral BID   Continuous Infusions:   Time spent: > 35 minutes    Penny Pia, MD [facility] Pager [phone]  If 7PM-7AM, please contact night-coverage [email or web address] Password [email or web address] 04/07/2016, 5:27 PM

## 2016-04-07 NOTE — Progress Notes (Signed)
H7H 9.0/27.8 Paged Dr. Maryruth HancockVega FYI

## 2016-04-08 DIAGNOSIS — R55 Syncope and collapse: Secondary | ICD-10-CM | POA: Diagnosis not present

## 2016-04-08 LAB — TYPE AND SCREEN
ABO/RH(D): B POS
ANTIBODY SCREEN: NEGATIVE
UNIT DIVISION: 0
Unit division: 0

## 2016-04-08 LAB — BASIC METABOLIC PANEL
ANION GAP: 8 (ref 5–15)
BUN: 60 mg/dL — ABNORMAL HIGH (ref 6–20)
CALCIUM: 8.9 mg/dL (ref 8.9–10.3)
CO2: 19 mmol/L — ABNORMAL LOW (ref 22–32)
Chloride: 113 mmol/L — ABNORMAL HIGH (ref 101–111)
Creatinine, Ser: 2.23 mg/dL — ABNORMAL HIGH (ref 0.61–1.24)
GFR calc Af Amer: 30 mL/min — ABNORMAL LOW (ref >60)
GFR, EST NON AFRICAN AMERICAN: 26 mL/min — AB (ref >60)
GLUCOSE: 99 mg/dL (ref 65–99)
POTASSIUM: 3.6 mmol/L (ref 3.5–5.1)
SODIUM: 140 mmol/L (ref 135–145)

## 2016-04-08 LAB — CBC
HCT: 26.7 % — ABNORMAL LOW (ref 39.0–52.0)
Hemoglobin: 8.7 g/dL — ABNORMAL LOW (ref 13.0–17.0)
MCH: 29.4 pg (ref 26.0–34.0)
MCHC: 32.6 g/dL (ref 30.0–36.0)
MCV: 90.2 fL (ref 78.0–100.0)
PLATELETS: 234 10*3/uL (ref 150–400)
RBC: 2.96 MIL/uL — AB (ref 4.22–5.81)
RDW: 20.5 % — AB (ref 11.5–15.5)
WBC: 12.7 10*3/uL — AB (ref 4.0–10.5)

## 2016-04-08 LAB — GLUCOSE, CAPILLARY
GLUCOSE-CAPILLARY: 134 mg/dL — AB (ref 65–99)
GLUCOSE-CAPILLARY: 126 mg/dL — AB (ref 65–99)
GLUCOSE-CAPILLARY: 126 mg/dL — AB (ref 65–99)
Glucose-Capillary: 157 mg/dL — ABNORMAL HIGH (ref 65–99)

## 2016-04-08 MED ORDER — HYDRALAZINE HCL 20 MG/ML IJ SOLN: 5.0000 mg | Freq: Four times a day (QID) | INTRAMUSCULAR | Status: DC | PRN

## 2016-04-08 MED ORDER — FERROUS SULFATE 325 (65 FE) MG PO TABS
325.0000 mg | ORAL_TABLET | Freq: Three times a day (TID) | ORAL | Status: DC
Start: 2016-04-08 — End: 2016-04-09
  Administered 2016-04-08 – 2016-04-09 (×4): 325 mg via ORAL
  Filled 2016-04-08 (×4): qty 1

## 2016-04-08 NOTE — Progress Notes (Signed)
PROGRESS NOTE    [name]  WUJ:[id number] DOB: 10/09/1930 DOA: 04/06/2016 PCP: [name],[name], MD    Brief Narrative:  82 yo male with history of HTN, CKD, lewy body dementia, DMII, PVD who was brought with cc of syncope. The patient does not remember exactly what happened. The fall was unwitnessed. He denied having any dizziness, chest pain , dyspnea, palpitations, focal weakness, focal sensory complaints, seizures, incontinence. He had a similar fall 6 months ago. Otherwise he has no complaints and no pain.    Assessment & Plan:   Active Problems:   Syncope - resolved. No work up requested by family, Please see H and P - improved after transfusion  Essential HTN - not well controlled currently. Will reassess BP and add hydralazine prn  Anemia - hgb at 8.7 - reassess next am. - add ferrous sulfate  Hypokalemia - replace and reassess   DVT prophylaxis: Lovenox Code Status:  DNR Family Communication: d/c patient directly Disposition Plan: will reassess hgb levels next am.   Consultants:   None   Procedures:  None   Antimicrobials:  None  Subjective: Pt has no new complaints.   Objective: Filed Vitals:   04/07/16 1959 04/08/16 0405 04/08/16 0415 04/08/16 0913  BP: 158/55 188/101 160/60 163/115  Pulse: 87 87  116  Temp: 98.3 F (36.8 C) 97.8 F (36.6 C)  98.3 F (36.8 C)  TempSrc: Oral Oral  Oral  Resp: 18 16  18   Height:      Weight:      SpO2: 97% 97%  100%    Intake/Output Summary (Last 24 hours) at 04/08/16 1526 Last data filed at 04/08/16 1406  Gross per 24 hour  Intake    720 ml  Output    350 ml  Net    370 ml   Filed Weights   04/07/16 0053 04/07/16 0559  Weight: 65.9 kg (145 lb 4.5 oz) 64.864 kg (143 lb)    Examination:  General exam: Appears calm and comfortable  Respiratory system: Clear to auscultation. Respiratory effort normal. Cardiovascular system: S1 & S2 heard, RRR.  Gastrointestinal system: Abdomen is nondistended,  soft and nontender. No organomegaly or masses felt. Normal bowel sounds heard. Central nervous system: Alert and oriented. No focal neurological deficits. Extremities: Symmetric 5 x 5 power. Skin: warm and pink Psychiatry:  Mood & affect appropriate.     Data Reviewed: I have personally reviewed following labs and imaging studies  CBC:  Recent Labs Lab 04/06/16 1939 04/07/16 0458 04/07/16 1823 04/08/16 0434  WBC 8.2 10.4  --  12.7*  NEUTROABS 5.8 7.5  --   --   HGB 6.0* 7.4* 9.0* 8.7*  HCT 18.5* 22.4* 27.8* 26.7*  MCV 93.9 91.4  --  90.2  PLT 265 236  --  234   Basic Metabolic Panel:  Recent Labs Lab 04/06/16 1939 04/07/16 0458 04/08/16 0434  NA 141 145 140  K 3.1* 2.9* 3.6  CL 114* 114* 113*  CO2 19* 22 19*  GLUCOSE 184* 114* 99  BUN 63* 58* 60*  CREATININE 2.45* 2.24* 2.23*  CALCIUM 8.4* 8.7* 8.9   GFR: Estimated Creatinine Clearance: 23 mL/min (by C-G formula based on Cr of 2.23). Liver Function Tests:  Recent Labs Lab 04/06/16 1939 04/07/16 0458  AST 20 18  ALT 10* 12*  ALKPHOS 82 77  BILITOT 0.5 0.6  PROT 4.6* 4.5*  ALBUMIN 2.4* 2.3*   No results for input(s): LIPASE, AMYLASE in the last 168  hours. No results for input(s): AMMONIA in the last 168 hours. Coagulation Profile: No results for input(s): INR, PROTIME in the last 168 hours. Cardiac Enzymes: No results for input(s): CKTOTAL, CKMB, CKMBINDEX, TROPONINI in the last 168 hours. BNP (last 3 results) No results for input(s): PROBNP in the last 8760 hours. HbA1C: No results for input(s): HGBA1C in the last 72 hours. CBG:  Recent Labs Lab 04/07/16 1155 04/07/16 1704 04/07/16 1953 04/08/16 0736 04/08/16 1201  GLUCAP 225* 113* 139* 134* 157*   Lipid Profile: No results for input(s): CHOL, HDL, LDLCALC, TRIG, CHOLHDL, LDLDIRECT in the last 72 hours. Thyroid Function Tests: No results for input(s): TSH, T4TOTAL, FREET4, T3FREE, THYROIDAB in the last 72 hours. Anemia Panel: No results  for input(s): VITAMINB12, FOLATE, FERRITIN, TIBC, IRON, RETICCTPCT in the last 72 hours. Sepsis Labs: No results for input(s): PROCALCITON, LATICACIDVEN in the last 168 hours.  No results found for this or any previous visit (from the past 240 hour(s)).       Radiology Studies: Dg Chest 2 View  04/06/2016  CLINICAL DATA:  Syncope and head injury today EXAM: CHEST  2 VIEW COMPARISON:  02/13/2016 FINDINGS: Mild to moderate cardiac enlargement similar to prior study. Vascular pattern normal. No consolidation effusion or pneumothorax. No persistent abnormal opacity at the right lung base. IMPRESSION: No acute findings Electronically Signed   By: Esperanza Heir M.D.   On: 04/06/2016 20:10   Ct Head Wo Contrast  04/06/2016  CLINICAL DATA:  Syncope.  Head injury. EXAM: CT HEAD WITHOUT CONTRAST CT CERVICAL SPINE WITHOUT CONTRAST TECHNIQUE: Multidetector CT imaging of the head and cervical spine was performed following the standard protocol without intravenous contrast. Multiplanar CT image reconstructions of the cervical spine were also generated. COMPARISON:  February 13, 2016 FINDINGS: CT HEAD FINDINGS There is a mucous retention cyst or polyp in the right maxillary sinus. Mastoid air cells or paranasal sinuses, and middle ears are otherwise normal. No subdural, epidural, or subarachnoid hemorrhage. Cerebellum, brainstem, and basal cisterns are normal. Ventricles and sulci are prominent but stable. Moderate to severe white matter changes are stable as well. 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 No acute cortical ischemia or infarct. No mass, mass effect, or midline shift. CT CERVICAL SPINE FINDINGS There is straightening of normal lordosis. No other malalignment. No fractures. Degenerative changes are seen most marked at C6-7. Chronic calcifications are seen in the neck, right greater than left. There is a nodule in the right lung apex which is  incompletely characterized, measuring 5 mm. IMPRESSION: 1. No acute intracranial abnormality. 2. No cervical spine fracture or malalignment. 3. 5mm nodule in the right upper lobe of the lungs, incompletely characterized. A CT of the chest could better characterize. Electronically Signed   By: Gerome Sam III M.D   On: 04/06/2016 20:17   Ct Cervical Spine Wo Contrast  04/06/2016  CLINICAL DATA:  Syncope.  Head injury. EXAM: CT HEAD WITHOUT CONTRAST CT CERVICAL SPINE WITHOUT CONTRAST TECHNIQUE: Multidetector CT imaging of the head and cervical spine was performed following the standard protocol without intravenous contrast. Multiplanar CT image reconstructions of the cervical spine were also generated. COMPARISON:  February 13, 2016 FINDINGS: CT HEAD FINDINGS There is a mucous retention cyst or polyp in the right maxillary sinus. Mastoid air cells or paranasal sinuses, and middle ears are otherwise normal. No subdural, epidural, or subarachnoid hemorrhage. Cerebellum, brainstem, and basal cisterns are normal. Ventricles and  sulci are prominent but stable. Moderate to severe white matter changes are stable as well. 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 No acute cortical ischemia or infarct. No mass, mass effect, or midline shift. CT CERVICAL SPINE FINDINGS There is straightening of normal lordosis. No other malalignment. No fractures. Degenerative changes are seen most marked at C6-7. Chronic calcifications are seen in the neck, right greater than left. There is a nodule in the right lung apex which is incompletely characterized, measuring 5 mm. IMPRESSION: 1. No acute intracranial abnormality. 2. No cervical spine fracture or malalignment. 3. 5mm nodule in the right upper lobe of the lungs, incompletely characterized. A CT of the chest could better characterize. Electronically Signed   By: Gerome Sam III M.D   On: 04/06/2016 20:17         Scheduled Meds: . allopurinol  100 mg Oral QHS  . allopurinol  300 mg Oral Daily  . amLODipine  5 mg Oral Daily  . aspirin EC  81 mg Oral QPM  . atorvastatin  40 mg Oral Daily  . calcitRIOL  0.5 mcg Oral Daily  . clopidogrel  75 mg Oral Daily  . enoxaparin (LOVENOX) injection  30 mg Subcutaneous Daily  . feeding supplement  1 Container Oral TID BM  . ferrous sulfate  325 mg Oral BID WC  . furosemide  240 mg Oral BID  . insulin aspart  0-9 Units Subcutaneous TID WC  . isosorbide mononitrate  60 mg Oral Daily  . lanthanum  1,000 mg Oral BID WC  . metolazone  10 mg Oral Q breakfast  . metoprolol succinate  50 mg Oral Daily  . pantoprazole  40 mg Oral QHS  . polyethylene glycol  17 g Oral Daily   Continuous Infusions:   Time spent: > 35 minutes    Penny Pia, MD [facility] Pager [phone]  If 7PM-7AM, please contact night-coverage [email or web address] Password [facility] 04/08/2016, 3:26 PM

## 2016-04-08 NOTE — Evaluation (Signed)
Physical Therapy Evaluation Patient Details Name: [name] MRN: [id number] DOB: 03/27/1930 Today's Date: 04/08/2016   History of Present Illness  82 yo male with history of HTN, CKD, lewy body dementia, DMII, PVD who was brought with cc of syncope. The patient does not remember exactly what happened. The fall was unwitnessed. He denied having any dizziness, chest pain , dyspnea, palpitations, focal weakness, focal sensory complaints, seizures, incontinence. He had a similar fall 6 months ago. Otherwise he has no complaints and no pain.   Clinical Impression  Pt admitted with above diagnosis. Pt currently with functional limitations due to the deficits listed below (see PT Problem List). Daughter states pt is close to baseline but his baseline isn't that great per daughter.  Pt able to ambulate with RW with cues for safety.  Daughter states he doesn't always remember to use it.  Daughter and son work.  Ultimately pt would benefit from SNF but is observation status therefore cannot go to SNF.  Daughter states that pt is eligible for Hospice care and she can call tomorrow and find out what they can provide so someone is checking on pt.  Possibility of RN and aide most likely.  PT and OT can't come in if pt Hospice care.  Will follow acutely.  Pt will benefit from skilled PT to increase their independence and safety with mobility to allow discharge to the venue listed below.      Follow Up Recommendations No PT follow up;Supervision/Assistance - 24 hour (pt has Hospice care per daughter)    Equipment Recommendations  None recommended by PT    Recommendations for Other Services       Precautions / Restrictions Precautions Precautions: Fall Restrictions Weight Bearing Restrictions: No      Mobility  Bed Mobility Overal bed mobility: Independent                Transfers Overall transfer level: Independent                  Ambulation/Gait Ambulation/Gait assistance: Min  guard;Supervision Ambulation Distance (Feet): 400 Feet Assistive device: Rolling walker (2 wheeled) Gait Pattern/deviations: Step-through pattern;Decreased stride length;Wide base of support;Trunk flexed;Drifts right/left   Gait velocity interpretation: Below normal speed for age/gender General Gait Details: Pt needed cues to stay close to RW.  At times, got too far away from RW with turns.  Pt is fairly safe with RW but really needs cues to use it as he doesn't always use it at home per daughter.   Stairs Stairs: Yes Stairs assistance: Min guard Stair Management: Two rails;Step to pattern;Alternating pattern;Forwards Number of Stairs: 6 General stair comments: Pt went up and down 6 steps with step to patten on way down and alternating on way up.  Min guard assist but did not have to physically assist pt.   Wheelchair Mobility    Modified Rankin (Stroke Patients Only)       Balance Overall balance assessment: Needs assistance;History of Falls Sitting-balance support: No upper extremity supported;Feet supported Sitting balance-Leahy Scale: Good     Standing balance support: Bilateral upper extremity supported;During functional activity Standing balance-Leahy Scale: Poor Standing balance comment: relies on Rw for balance.              High level balance activites: Turns;Direction changes High Level Balance Comments: needs cues to stay close to RW             Pertinent Vitals/Pain Pain Assessment: No/denies pain  VSS  Home Living Family/patient expects to be discharged to:: Private residence Living Arrangements: Children Available Help at Discharge: Family;Available PRN/intermittently (lives with daughter who works 8am-7pm 6 days week) Type of Home: House Home Access: Stairs to enter Entrance Stairs-Rails: Right;Left;Can reach both Secretary/administrator of Steps: 6 Home Layout: Able to live on main level with bedroom/bathroom;Two level Home Equipment: Walker  - 2 wheels;Cane - single point;Shower seat;Walker - 4 wheels;Wheelchair - manual      Prior Function Level of Independence: Needs assistance   Gait / Transfers Assistance Needed: per daughter used cane at times. They have tried to get him to use rollator but pt refuses.   ADL's / Homemaking Assistance Needed: daughter bathes pt in shower; daughter does all cooking, cleaning etc.  Pt can dress himself.    Comments: Daughter drops pt off in am to eat with friends for bfast. she goes back and picks him up and takes him back home.     Hand Dominance   Dominant Hand: Right    Extremity/Trunk Assessment   Upper Extremity Assessment: Defer to OT evaluation           Lower Extremity Assessment: Generalized weakness      Cervical / Trunk Assessment: Kyphotic  Communication   Communication: No difficulties  Cognition Arousal/Alertness: Awake/alert Behavior During Therapy: Flat affect Overall Cognitive Status: History of cognitive impairments - at baseline                      General Comments      Exercises        Assessment/Plan    PT Assessment Patient needs continued PT services  PT Diagnosis Generalized weakness   PT Problem List Decreased activity tolerance;Decreased balance;Decreased strength;Decreased mobility;Decreased knowledge of use of DME;Decreased safety awareness;Decreased knowledge of precautions  PT Treatment Interventions Gait training;DME instruction;Stair training;Functional mobility training;Therapeutic activities;Therapeutic exercise;Balance training;Patient/family education   PT Goals (Current goals can be found in the Care Plan section) Acute Rehab PT Goals Patient Stated Goal: to go home PT Goal Formulation: With patient/family Time For Goal Achievement: 04/15/16 Potential to Achieve Goals: Good    Frequency Min 3X/week   Barriers to discharge Decreased caregiver support (children work)      Co-evaluation               End  of Session Equipment Utilized During Treatment: Gait belt Activity Tolerance: Patient limited by fatigue Patient left: in chair;with call bell/phone within reach;with chair alarm set;with family/visitor present Nurse Communication: Mobility status    Functional Assessment Tool Used: clinical judgment Functional Limitation: Mobility: Walking and moving around Mobility: Walking and Moving Around Current Status [phone]): At least 1 percent but less than 20 percent impaired, limited or restricted Mobility: Walking and Moving Around Goal Status [phone]): At least 1 percent but less than 20 percent impaired, limited or restricted    Time: 1445-1510 PT Time Calculation (min) (ACUTE ONLY): 25 min   Charges:   PT Evaluation $PT Eval Moderate Complexity: 1 Procedure PT Treatments $Gait Training: 8-22 mins   PT G Codes:   PT G-Codes **NOT FOR INPATIENT CLASS** Functional Assessment Tool Used: clinical judgment Functional Limitation: Mobility: Walking and moving around Mobility: Walking and Moving Around Current Status ([id number]): At least 1 percent but less than 20 percent impaired, limited or restricted Mobility: Walking and Moving Around Goal Status [phone]): At least 1 percent but less than 20 percent impaired, limited or restricted    [name] F 04/08/2016, 3:28 PM  Eesa Justiss,PT Acute Rehabilitation [phone] (pager)  

## 2016-04-09 DIAGNOSIS — R55 Syncope and collapse: Secondary | ICD-10-CM | POA: Diagnosis not present

## 2016-04-09 LAB — GLUCOSE, CAPILLARY
GLUCOSE-CAPILLARY: 165 mg/dL — AB (ref 65–99)
Glucose-Capillary: 187 mg/dL — ABNORMAL HIGH (ref 65–99)
Glucose-Capillary: 126 mg/dL — ABNORMAL HIGH (ref 65–99)
Glucose-Capillary: 162 mg/dL — ABNORMAL HIGH (ref 65–99)

## 2016-04-09 LAB — CBC
HCT: 25.9 % — ABNORMAL LOW (ref 39.0–52.0)
Hemoglobin: 8.4 g/dL — ABNORMAL LOW (ref 13.0–17.0)
MCH: 29.1 pg (ref 26.0–34.0)
MCHC: 32.4 g/dL (ref 30.0–36.0)
MCV: 89.6 fL (ref 78.0–100.0)
PLATELETS: 204 10*3/uL (ref 150–400)
RBC: 2.89 MIL/uL — ABNORMAL LOW (ref 4.22–5.81)
RDW: 19.7 % — AB (ref 11.5–15.5)
WBC: 10 10*3/uL (ref 4.0–10.5)

## 2016-04-09 NOTE — Significant Event (Signed)
Discussed discharge instructions with patient and daughter.  All instructions with medications and return appointments discussed.  All patient belongings gathered and sent home with patient.

## 2016-04-09 NOTE — Discharge Summary (Signed)
Physician Discharge Summary  [name] YQM:[id number] DOB: [birth year] DOA: 04/06/2016  PCP: Lupita Raider, MD  Admit date: 04/06/2016 Discharge date: 04/09/2016  Time spent: > 35 minutes  Recommendations for Outpatient Follow-up:  1. Monitor hgb levels   Discharge Diagnoses:  Active Problems:   Syncope   Discharge Condition: stable  Diet recommendation: heart healthy  Filed Weights   04/07/16 0053 04/07/16 0559 04/08/16 2128  Weight: 65.9 kg (145 lb 4.5 oz) 64.864 kg (143 lb) 69.264 kg (152 lb 11.2 oz)    History of present illness:  82 yo male with history of HTN, CKD, lewy body dementia, DMII, PVD who was brought with cc of syncope. The patient does not remember exactly what happened. The fall was unwitnessed. He denied having any dizziness, chest pain , dyspnea, palpitations, focal weakness, focal sensory complaints, seizures, incontinence. He had a similar fall 6 months ago.   Hospital Course:  Syncope  -resolved after transfusion of 2 units of PRBC - family wanted limited work up given that he is in home hospice  For known medical conditions listed above will continue home medication regimen listed below.  Procedures:  none  Consultations:  None  Discharge Exam: Filed Vitals:   04/09/16 0424 04/09/16 0945  BP: 152/45 170/53  Pulse: 75 76  Temp: 98.2 F (36.8 C) 97.4 F (36.3 C)  Resp: 20 20    General: Pt in nad, alert and awake Cardiovascular: rrr, no rubs Respiratory: no increased wob, no wheezes  Discharge Instructions   Discharge Instructions    Call MD for:  difficulty breathing, headache or visual disturbances    Complete by:  As directed      Call MD for:  extreme fatigue    Complete by:  As directed      Diet - low sodium heart healthy    Complete by:  As directed      Discharge instructions    Complete by:  As directed   Please make sure to reassess your blood levels within the next 1 week.     Increase activity slowly    Complete  by:  As directed           Current Discharge Medication List    CONTINUE these medications which have NOT CHANGED   Details  ACCU-CHEK FASTCLIX LANCETS MISC Use to check blood sugar 2 times per day dx code E11.9 Qty: 102 each, Refills: 2    !! allopurinol (ZYLOPRIM) 100 MG tablet Take 100 mg by mouth at bedtime. Refills: 6    !! allopurinol (ZYLOPRIM) 300 MG tablet Take 300 mg by mouth every morning.  Refills: 3    amLODipine (NORVASC) 10 MG tablet Take 0.5 tablets (5 mg total) by mouth daily.    aspirin EC 81 MG tablet Take 81 mg by mouth every evening.    atorvastatin (LIPITOR) 40 MG tablet Take 40 mg by mouth every morning.  Refills: 5    calcitRIOL (ROCALTROL) 0.25 MCG capsule Take 2 capsules (0.5 mcg total) by mouth daily. Qty: 60 capsule, Refills: 0    camphor-menthol (SARNA) lotion Apply topically as needed for itching. Qty: 222 mL, Refills: 0    clopidogrel (PLAVIX) 75 MG tablet Take 1 tablet (75 mg total) by mouth daily. Please schedule appointment for refills. Qty: 30 tablet, Refills: 0    feeding supplement (BOOST / RESOURCE BREEZE) LIQD Take 1 Container by mouth 3 (three) times daily between meals.    ferrous sulfate 325 (65 FE) MG  tablet Take 325 mg by mouth 2 (two) times daily with a meal.    furosemide (LASIX) 80 MG tablet Take 240 mg by mouth 2 (two) times daily.  Refills: 6    glucose blood (ACCU-CHEK AVIVA PLUS) test strip Use as instructed to check blood sugar 2 times per day dx code E11.9 Qty: 100 each, Refills: 3    isosorbide mononitrate (IMDUR) 60 MG 24 hr tablet Take 1 tablet (60 mg total) by mouth daily. Qty: 30 tablet, Refills: 1    JANUVIA 25 MG tablet TAKE 1 TABLET BY MOUTH DAILY. Qty: 30 tablet, Refills: 2    lanthanum (FOSRENOL) 1000 MG chewable tablet Chew 1 tablet (1,000 mg total) by mouth 3 (three) times daily with meals. Qty: 60 tablet, Refills: 0    metolazone (ZAROXOLYN) 10 MG tablet Take 10 mg by mouth every morning.     metoprolol succinate (TOPROL-XL) 50 MG 24 hr tablet Take 1 tablet (50 mg total) by mouth daily. Take with or immediately following a meal. Qty: 30 tablet, Refills: 1    Multiple Vitamins-Minerals (PRESERVISION AREDS PO) Take 1 tablet by mouth 2 (two) times daily.     pantoprazole (PROTONIX) 40 MG tablet Take 40 mg by mouth at bedtime. Refills: 5    polyethylene glycol (MIRALAX / GLYCOLAX) packet Take 17 g by mouth daily as needed. constipation Refills: 0    Potassium Chloride ER 20 MEQ TBCR Take 20 mEq by mouth 2 (two) times daily.  Refills: 6     !! - Potential duplicate medications found. Please discuss with provider.     Allergies  Allergen Reactions  . Labetalol Swelling    angioedema      The results of significant diagnostics from this hospitalization (including imaging, microbiology, ancillary and laboratory) are listed below for reference.    Significant Diagnostic Studies: Dg Chest 2 View  04/06/2016  CLINICAL DATA:  Syncope and head injury today EXAM: CHEST  2 VIEW COMPARISON:  02/13/2016 FINDINGS: Mild to moderate cardiac enlargement similar to prior study. Vascular pattern normal. No consolidation effusion or pneumothorax. No persistent abnormal opacity at the right lung base. IMPRESSION: No acute findings Electronically Signed   By: Esperanza Heiraymond  Rubner M.D.   On: 04/06/2016 20:10   Ct Head Wo Contrast  04/06/2016  CLINICAL DATA:  Syncope.  Head injury. EXAM: CT HEAD WITHOUT CONTRAST CT CERVICAL SPINE WITHOUT CONTRAST TECHNIQUE: Multidetector CT imaging of the head and cervical spine was performed following the standard protocol without intravenous contrast. Multiplanar CT image reconstructions of the cervical spine were also generated. COMPARISON:  February 13, 2016 FINDINGS: CT HEAD FINDINGS There is a mucous retention cyst or polyp in the right maxillary sinus. Mastoid air cells or paranasal sinuses, and middle ears are otherwise normal. No subdural, epidural, or subarachnoid  hemorrhage. Cerebellum, brainstem, and basal cisterns are normal. Ventricles and sulci are prominent but stable. Moderate to severe white matter changes are stable as well. 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 No acute cortical ischemia or infarct. No mass, mass effect, or midline shift. CT CERVICAL SPINE FINDINGS There is straightening of normal lordosis. No other malalignment. No fractures. Degenerative changes are seen most marked at C6-7. Chronic calcifications are seen in the neck, right greater than left. There is a nodule in the right lung apex which is incompletely characterized, measuring 5 mm. IMPRESSION: 1. No acute intracranial abnormality. 2. No cervical spine fracture or malalignment. 3.  5mm nodule in the right upper lobe of the lungs, incompletely characterized. A CT of the chest could better characterize. Electronically Signed   By: Gerome Sam III M.D   On: 04/06/2016 20:17   Ct Cervical Spine Wo Contrast  04/06/2016  CLINICAL DATA:  Syncope.  Head injury. EXAM: CT HEAD WITHOUT CONTRAST CT CERVICAL SPINE WITHOUT CONTRAST TECHNIQUE: Multidetector CT imaging of the head and cervical spine was performed following the standard protocol without intravenous contrast. Multiplanar CT image reconstructions of the cervical spine were also generated. COMPARISON:  February 13, 2016 FINDINGS: CT HEAD FINDINGS There is a mucous retention cyst or polyp in the right maxillary sinus. Mastoid air cells or paranasal sinuses, and middle ears are otherwise normal. No subdural, epidural, or subarachnoid hemorrhage. Cerebellum, brainstem, and basal cisterns are normal. Ventricles and sulci are prominent but stable. Moderate to severe white matter changes are stable as well. 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 No acute cortical ischemia or infarct. No mass, mass effect, or midline shift. CT  CERVICAL SPINE FINDINGS There is straightening of normal lordosis. No other malalignment. No fractures. Degenerative changes are seen most marked at C6-7. Chronic calcifications are seen in the neck, right greater than left. There is a nodule in the right lung apex which is incompletely characterized, measuring 5 mm. IMPRESSION: 1. No acute intracranial abnormality. 2. No cervical spine fracture or malalignment. 3. 5mm nodule in the right upper lobe of the lungs, incompletely characterized. A CT of the chest could better characterize. Electronically Signed   By: Gerome Sam III M.D   On: 04/06/2016 20:17    Microbiology: No results found for this or any previous visit (from the past 240 hour(s)).   Labs: Basic Metabolic Panel:  Recent Labs Lab 04/06/16 1939 04/07/16 0458 04/08/16 0434  NA 141 145 140  K 3.1* 2.9* 3.6  CL 114* 114* 113*  CO2 19* 22 19*  GLUCOSE 184* 114* 99  BUN 63* 58* 60*  CREATININE 2.45* 2.24* 2.23*  CALCIUM 8.4* 8.7* 8.9   Liver Function Tests:  Recent Labs Lab 04/06/16 1939 04/07/16 0458  AST 20 18  ALT 10* 12*  ALKPHOS 82 77  BILITOT 0.5 0.6  PROT 4.6* 4.5*  ALBUMIN 2.4* 2.3*   No results for input(s): LIPASE, AMYLASE in the last 168 hours. No results for input(s): AMMONIA in the last 168 hours. CBC:  Recent Labs Lab 04/06/16 1939 04/07/16 0458 04/07/16 1823 04/08/16 0434 04/09/16 0443  WBC 8.2 10.4  --  12.7* 10.0  NEUTROABS 5.8 7.5  --   --   --   HGB 6.0* 7.4* 9.0* 8.7* 8.4*  HCT 18.5* 22.4* 27.8* 26.7* 25.9*  MCV 93.9 91.4  --  90.2 89.6  PLT 265 236  --  234 204   Cardiac Enzymes: No results for input(s): CKTOTAL, CKMB, CKMBINDEX, TROPONINI in the last 168 hours. BNP: BNP (last 3 results)  Recent Labs  09/11/15 0955 11/16/15 1015 02/14/16 0115  BNP 2200.4* 2120.5* 331.7*    ProBNP (last 3 results) No results for input(s): PROBNP in the last 8760 hours.  CBG:  Recent Labs Lab 04/08/16 0736 04/08/16 1201  04/08/16 1719 04/08/16 2125 04/09/16 0752  GLUCAP 134* 157* 126* 126* 165*       Signed:  Penny Pia MD.  [facility] 04/09/2016, 12:15 PM

## 2016-04-09 NOTE — Care Management Obs Status (Signed)
MEDICARE OBSERVATION STATUS NOTIFICATION   Patient Details  Name: [name] MRN: [id number] Date of Birth: 02/29/1930   Medicare Observation Status Notification Given:  Yes  Discussed at length with pt daughter, who has called several times asking for status to be changed. This was discussed with OBS reviewer and pt meets OBS criteria only. Pt daughter also called pt insurance provider and was told that the doctor could change this and that it would be retro active. This CM again explained the process of review and that this pt does not meet INPT criteria.   Delina Kruczek, Annamarie Majorheryl U, RN 04/09/2016, 5:03 PM

## 2016-04-09 NOTE — Progress Notes (Signed)
Physical Therapy Treatment Patient Details Name: [name] MRN: [id number] DOB: 02/18/1930 Today's Date: 04/09/2016    History of Present Illness 82 yo male with history of HTN, CKD, lewy body dementia, DMII, PVD who was brought with cc of syncope. The patient does not remember exactly what happened. The fall was unwitnessed. He denied having any dizziness, chest pain , dyspnea, palpitations, focal weakness, focal sensory complaints, seizures, incontinence. He had a similar fall 6 months ago. Otherwise he has no complaints and no pain.     PT Comments    Somewhat fatigued today, but still quite willing to get up and around and walk; less distance compared to last session due to fatigue; I agree that home is likely the most therapeutic environment for [name]; Are they able to set up for 24 hour assist?   Follow Up Recommendations  No PT follow up;Supervision/Assistance - 24 hour (pt has Hospice care per daughter)     Equipment Recommendations  None recommended by PT    Recommendations for Other Services       Precautions / Restrictions Precautions Precautions: Fall Restrictions Weight Bearing Restrictions: No    Mobility  Bed Mobility Overal bed mobility: Independent                Transfers Overall transfer level: Independent                  Ambulation/Gait Ambulation/Gait assistance: Min guard Ambulation Distance (Feet): 150 Feet Assistive device: Rolling walker (2 wheeled) Gait Pattern/deviations: Step-through pattern;Decreased stride length;Trunk flexed     General Gait Details: Pt needed cues to stay close to RW.  At times, got too far away from RW with turns.  Pt is fairly safe with RW but really needs cues to use it as he doesn't always use it at home per daughter.    Stairs            Wheelchair Mobility    Modified Rankin (Stroke Patients Only)       Balance     Sitting balance-Leahy Scale: Good       Standing  balance-Leahy Scale: Poor                      Cognition Arousal/Alertness: Awake/alert Behavior During Therapy: WFL for tasks assessed/performed Overall Cognitive Status: History of cognitive impairments - at baseline                      Exercises      General Comments        Pertinent Vitals/Pain Pain Assessment: No/denies pain    Home Living                      Prior Function            PT Goals (current goals can now be found in the care plan section) Acute Rehab PT Goals Patient Stated Goal: to go home PT Goal Formulation: With patient/family Time For Goal Achievement: 04/15/16 Potential to Achieve Goals: Good Progress towards PT goals: Progressing toward goals    Frequency  Min 3X/week    PT Plan Current plan remains appropriate    Co-evaluation             End of Session Equipment Utilized During Treatment: Gait belt Activity Tolerance: Patient limited by fatigue Patient left: in chair;with call bell/phone within reach;with chair alarm set     Time: [id number]  PT Time Calculation (min) (ACUTE ONLY): 21 min  Charges:  $Gait Training: 8-22 mins                    G Codes:      [name] 04/09/2016, 4:43 PM  [name], [name]  Acute Rehabilitation Services Pager [phone] Office [phone]

## 2016-04-14 ENCOUNTER — Other Ambulatory Visit: Payer: Self-pay | Admitting: Endocrinology

## 2016-04-18 NOTE — ED Provider Notes (Signed)
CSN: [id number]     Arrival date & time 04/06/16  1910 History   First MD Initiated Contact with Patient 04/06/16 1918     Chief Complaint  Patient presents with  . Fall  . Near Syncope     Patient is a 82 y.o. male presenting with fall and near-syncope. The history is provided by the patient and a relative. No language interpreter was used.  Fall  Near Syncope   Pasty ArchRoy C Sokolowski is a 82 y.o. male who presents to the Emergency Department complaining of fall/syncope.  Pt presents for evaluation following a fall in the garage at home.  Pt has no recollection of event - unclear if syncope preceded fall or following the fall. He has no complaints on ED arrival. Level V caveat due to confusion.    Past Medical History  Diagnosis Date  . Diabetes mellitus   . Hypertension   . Hypercholesteremia   . Gout   . PVD (peripheral vascular disease) (HCC) 7/05    LCE, known 60% RICA 7/12  . CAD (coronary artery disease) 2002    non critical  . Macular degeneration   . Chronic renal disease, stage III   . Claudication (HCC)   . Palpitations     PVCs and PSVT on Holter monitoring  . Bilateral carotid artery disease (HCC)     status post left carotid endarterectomy performed by Dr. Madilyn FiremanHayes 05/12/04  . Anemia    Past Surgical History  Procedure Laterality Date  . 2d echocardiogram  03/31/2008    EF greater than 55%  . Cardiovascular stress test  06/30/2010    Nonischemic. Low risk  . Cerebral angiogram  04/03/2004    High-grade 80% ostial L ICA stenosis. Medical management.  . Abdominal aortogram  09/01/2007    Widely patent renal arteries. Medical management.  . Peripheral vascular angiogram  05/07/2011    No evidence of intracranial occlusions, stenosis, dissections, or aneurysms seen  . Carotid doppler  06/09/2012    R Vertebral-known occluded vessel, R Bulb/Proximal ICA-moderate to severe amount of plaque w/50-69% diameter reduction, L Subclavian-50-69% diameter reduction, L CEA-demonstrated  increased velocities w/o evidence of hemodynamically significant stenosis  . Cardiac catheterization  10/17/2001    No significant CAD, normal LV systolic function.  . Carotid endarterectomy Left July 2005  . Lower extremity angiogram Bilateral 11/01/2014    Procedure: LOWER EXTREMITY ANGIOGRAM;  Surgeon: Runell GessJonathan J Berry, MD;  [facility];  Service: Cardiovascular;  Laterality: Bilateral;  . Abdominal angiogram  11/01/2014    Procedure: ABDOMINAL ANGIOGRAM;  Surgeon: Runell GessJonathan J Berry, MD;  [facility];  Service: Cardiovascular;;  . Knee surgery    . Hernia repair    . Cardiac catheterization N/A 11/16/2015    Procedure: Left Heart Cath and Coronary Angiography;  Surgeon: Yates DecampJay Ganji, MD;  [facility];  Service: Cardiovascular;  Laterality: N/A;  . Cardiac catheterization Right 11/16/2015    Procedure: Intravascular Ultrasound/IVUS;  Surgeon: Yates DecampJay Ganji, MD;  [facility];  Service: Cardiovascular;  Laterality: Right;  . Insertion of dialysis catheter Right 11/23/2015    Procedure: INSERTION OF DIALYSIS CATHETER RIGHT INTERNAL JUGULAR;  Surgeon: Sherren Kernsharles E Fields, MD;  [facility];  Service: Vascular;  Laterality: Right;  . Av fistula placement Right 01/25/2016    Procedure: BRACHIOCEPHALIC ARTERIOVENOUS (AV) FISTULA CREATION;  Surgeon: Sherren Kernsharles E Fields, MD;  [facility];  Service: Vascular;  Laterality: Right;   Family History  Problem Relation Age of Onset  . Stroke Sister   . Heart disease Brother   . Diabetes Neg Hx    Social History  Substance Use Topics  . Smoking status: Former Smoker -- 1.00 packs/day for 40 years  . Smokeless tobacco: Never Used     Comment: quit approx. 20 years ago.  . Alcohol Use: No    Review of Systems  Cardiovascular: Positive for near-syncope.  All other systems reviewed and are negative.     Allergies  Labetalol  Home Medications   Prior to Admission medications   Medication Sig Start Date  End Date Taking? Authorizing Provider  ACCU-CHEK FASTCLIX LANCETS MISC Use to check blood sugar 2 times per day dx code E11.9 07/20/15  Yes Reather Littler, MD  allopurinol (ZYLOPRIM) 100 MG tablet Take 100 mg by mouth at bedtime. 03/21/16  Yes Historical Provider, MD  allopurinol (ZYLOPRIM) 300 MG tablet Take 300 mg by mouth every morning.  01/24/16  Yes Historical Provider, MD  amLODipine (NORVASC) 10 MG tablet Take 0.5 tablets (5 mg total) by mouth daily. Patient taking differently: Take 5 mg by mouth every morning.  11/26/15  Yes Elease Etienne, MD  aspirin EC 81 MG tablet Take 81 mg by mouth every evening.   Yes Historical Provider, MD  atorvastatin (LIPITOR) 40 MG tablet Take 40 mg by mouth every morning.  03/01/16  Yes Historical Provider, MD  calcitRIOL (ROCALTROL) 0.25 MCG capsule Take 2 capsules (0.5 mcg total) by mouth daily. 11/26/15  Yes Elease Etienne, MD  camphor-menthol [facility]) lotion Apply topically as needed for itching. 02/17/16  Yes Hollice Espy, MD  clopidogrel (PLAVIX) 75 MG tablet Take 1 tablet (75 mg total) by mouth daily. Please schedule appointment for refills. 03/05/16  Yes [name], MD  feeding supplement (BOOST / RESOURCE BREEZE) LIQD Take 1 Container by mouth 3 (three) times daily between meals.   Yes Historical Provider, MD  ferrous sulfate 325 (65 FE) MG tablet Take 325 mg by mouth 2 (two) times daily with a meal.   Yes Historical Provider, MD  furosemide (LASIX) 80 MG tablet Take 240 mg by mouth 2 (two) times daily.  01/24/16  Yes Historical Provider, MD  glucose blood (ACCU-CHEK AVIVA PLUS) test strip Use as instructed to check blood sugar 2 times per day dx code E11.9 06/01/15  Yes Reather Littler, MD  isosorbide mononitrate (IMDUR) 60 MG 24 hr tablet Take 1 tablet (60 mg total) by mouth daily. 09/23/15  Yes Catarina Hartshorn, MD  lanthanum (FOSRENOL) 1000 MG chewable tablet Chew 1 tablet (1,000 mg total) by mouth 3 (three) times daily with meals. Patient taking differently:  Chew 1,000 mg by mouth 2 (two) times daily with a meal.  11/26/15  Yes Elease Etienne, MD  metolazone (ZAROXOLYN) 10 MG tablet Take 10 mg by mouth every morning. 12/30/15  Yes Historical Provider, MD  metoprolol succinate (TOPROL-XL) 50 MG 24 hr tablet Take 1 tablet (50 mg total) by mouth daily. Take with or immediately following a meal. 09/24/15  Yes Catarina Hartshorn, MD  Multiple Vitamins-Minerals (PRESERVISION AREDS PO) Take 1 tablet by mouth 2 (two) times daily.    Yes Historical Provider, MD  pantoprazole (PROTONIX) 40 MG tablet Take 40 mg by mouth at bedtime. 03/15/16  Yes Historical Provider, MD  polyethylene glycol (MIRALAX / GLYCOLAX) packet Take 17 g by mouth daily as needed. constipation 11/15/15  Yes Historical Provider, MD  Potassium Chloride ER 20 MEQ TBCR  Take 20 mEq by mouth 2 (two) times daily.  02/02/16  Yes Historical Provider, MD  JANUVIA 25 MG tablet TAKE 1 TABLET BY MOUTH DAILY. 04/15/16   Reather Littler, MD   BP 170/53 mmHg  Pulse 76  Temp(Src) 97.4 F (36.3 C) (Oral)  Resp 20  Ht 5\' 9"  (1.753 m)  Wt 152 lb 11.2 oz (69.264 kg)  BMI 22.54 kg/m2  SpO2 98% Physical Exam  Constitutional: He appears well-developed and well-nourished.  HENT:  Head: Normocephalic and atraumatic.  Cardiovascular: Normal rate and regular rhythm.   No murmur heard. Pulmonary/Chest: Effort normal and breath sounds normal. No respiratory distress.  Abdominal: Soft. There is no tenderness. There is no rebound and no guarding.  Musculoskeletal: He exhibits no edema or tenderness.  fistual in RUE with palpable thrill.   Neurological: He is alert.  Mildly confused.  MAE symmetrically.   Skin: Skin is warm and dry.  Psychiatric: He has a normal mood and affect. His behavior is normal.  Nursing note and vitals reviewed.   ED Course  Procedures (including critical care time) Labs Review Labs Reviewed  COMPREHENSIVE METABOLIC PANEL - Abnormal; Notable for the following:    Potassium 3.1 (*)    Chloride 114  (*)    CO2 19 (*)    Glucose, Bld 184 (*)    BUN 63 (*)    Creatinine, Ser 2.45 (*)    Calcium 8.4 (*)    Total Protein 4.6 (*)    Albumin 2.4 (*)    ALT 10 (*)    GFR calc non Af Amer 23 (*)    GFR calc Af Amer 26 (*)    All other components within normal limits  CBC WITH DIFFERENTIAL/PLATELET - Abnormal; Notable for the following:    RBC 1.97 (*)    Hemoglobin 6.0 (*)    HCT 18.5 (*)    RDW 19.5 (*)    All other components within normal limits  COMPREHENSIVE METABOLIC PANEL - Abnormal; Notable for the following:    Potassium 2.9 (*)    Chloride 114 (*)    Glucose, Bld 114 (*)    BUN 58 (*)    Creatinine, Ser 2.24 (*)    Calcium 8.7 (*)    Total Protein 4.5 (*)    Albumin 2.3 (*)    ALT 12 (*)    GFR calc non Af Amer 25 (*)    GFR calc Af Amer 29 (*)    All other components within normal limits  CBC WITH DIFFERENTIAL/PLATELET - Abnormal; Notable for the following:    RBC 2.45 (*)    Hemoglobin 7.4 (*)    HCT 22.4 (*)    RDW 19.7 (*)    All other components within normal limits  GLUCOSE, CAPILLARY - Abnormal; Notable for the following:    Glucose-Capillary 136 (*)    All other components within normal limits  GLUCOSE, CAPILLARY - Abnormal; Notable for the following:    Glucose-Capillary 225 (*)    All other components within normal limits  HEMOGLOBIN AND HEMATOCRIT, BLOOD - Abnormal; Notable for the following:    Hemoglobin 9.0 (*)    HCT 27.8 (*)    All other components within normal limits  CBC - Abnormal; Notable for the following:    WBC 12.7 (*)    RBC 2.96 (*)    Hemoglobin 8.7 (*)    HCT 26.7 (*)    RDW 20.5 (*)    All other components within  normal limits  BASIC METABOLIC PANEL - Abnormal; Notable for the following:    Chloride 113 (*)    CO2 19 (*)    BUN 60 (*)    Creatinine, Ser 2.23 (*)    GFR calc non Af Amer 26 (*)    GFR calc Af Amer 30 (*)    All other components within normal limits  GLUCOSE, CAPILLARY - Abnormal; Notable for the  following:    Glucose-Capillary 113 (*)    All other components within normal limits  GLUCOSE, CAPILLARY - Abnormal; Notable for the following:    Glucose-Capillary 139 (*)    All other components within normal limits  GLUCOSE, CAPILLARY - Abnormal; Notable for the following:    Glucose-Capillary 134 (*)    All other components within normal limits  GLUCOSE, CAPILLARY - Abnormal; Notable for the following:    Glucose-Capillary 157 (*)    All other components within normal limits  CBC - Abnormal; Notable for the following:    RBC 2.89 (*)    Hemoglobin 8.4 (*)    HCT 25.9 (*)    RDW 19.7 (*)    All other components within normal limits  GLUCOSE, CAPILLARY - Abnormal; Notable for the following:    Glucose-Capillary 126 (*)    All other components within normal limits  GLUCOSE, CAPILLARY - Abnormal; Notable for the following:    Glucose-Capillary 126 (*)    All other components within normal limits  GLUCOSE, CAPILLARY - Abnormal; Notable for the following:    Glucose-Capillary 165 (*)    All other components within normal limits  GLUCOSE, CAPILLARY - Abnormal; Notable for the following:    Glucose-Capillary 187 (*)    All other components within normal limits  GLUCOSE, CAPILLARY - Abnormal; Notable for the following:    Glucose-Capillary 126 (*)    All other components within normal limits  GLUCOSE, CAPILLARY - Abnormal; Notable for the following:    Glucose-Capillary 162 (*)    All other components within normal limits  POC OCCULT BLOOD, ED - Abnormal; Notable for the following:    Fecal Occult Bld POSITIVE (*)    All other components within normal limits  I-STAT TROPOININ, ED  PREPARE RBC (CROSSMATCH)  TYPE AND SCREEN  PREPARE RBC (CROSSMATCH)    Imaging Review No results found. I have personally reviewed and evaluated these images and lab results as part of my medical decision-making.   EKG Interpretation   Date/Time:  Friday April 06 2016 19:35:54 EDT Ventricular  Rate:  77 PR Interval:  165 QRS Duration: 97 QT Interval:  444 QTC Calculation: 502 R Axis:   73 Text Interpretation:  Sinus rhythm Multiform ventricular premature  complexes Left atrial enlargement Repol abnrm, severe global ischemia  (LM/MVD) Prolonged QT interval Baseline wander in lead(s) II III aVF  Confirmed by Lincoln Brigham [phone]) on 04/06/2016 8:35:34 PM      MDM   Final diagnoses:  Syncope, unspecified syncope type  Symptomatic anemia    Pt with hx/o renal disease presents from home following a fall with syncopal episode.  He has no complaints in the ED.  CT scan negative for acute intracranial abnormality.  CBC demonstrates significant anemia, plan to transfuse for symptomatic anemia and admit for further treatment.      Tilden Fossa, MD 04/18/16 1119

## 2016-04-19 ENCOUNTER — Encounter: Payer: Self-pay | Admitting: Vascular Surgery

## 2016-04-26 ENCOUNTER — Ambulatory Visit (HOSPITAL_COMMUNITY)
Admission: RE | Admit: 2016-04-26 | Discharge: 2016-04-26 | Disposition: A | Discharge status: Home / Self Care | Source: Ambulatory Visit | Attending: Vascular Surgery | Admitting: Vascular Surgery

## 2016-04-26 ENCOUNTER — Encounter: Payer: Self-pay | Admitting: Vascular Surgery

## 2016-04-26 ENCOUNTER — Ambulatory Visit (INDEPENDENT_AMBULATORY_CARE_PROVIDER_SITE_OTHER): Payer: Self-pay | Admitting: Vascular Surgery

## 2016-04-26 VITALS — BP 182/52 | HR 60 | Temp 97.7°F | Resp 20 | Ht 66.0 in | Wt 150.0 lb

## 2016-04-26 DIAGNOSIS — N186 End stage renal disease: Secondary | ICD-10-CM | POA: Insufficient documentation

## 2016-04-26 DIAGNOSIS — E1122 Type 2 diabetes mellitus with diabetic chronic kidney disease: Secondary | ICD-10-CM | POA: Insufficient documentation

## 2016-04-26 DIAGNOSIS — E7800 Pure hypercholesterolemia, unspecified: Secondary | ICD-10-CM | POA: Diagnosis not present

## 2016-04-26 DIAGNOSIS — I2510 Atherosclerotic heart disease of native coronary artery without angina pectoris: Secondary | ICD-10-CM | POA: Insufficient documentation

## 2016-04-26 DIAGNOSIS — I120 Hypertensive chronic kidney disease with stage 5 chronic kidney disease or end stage renal disease: Secondary | ICD-10-CM | POA: Insufficient documentation

## 2016-04-26 DIAGNOSIS — Z4931 Encounter for adequacy testing for hemodialysis: Secondary | ICD-10-CM | POA: Insufficient documentation

## 2016-04-26 DIAGNOSIS — N184 Chronic kidney disease, stage 4 (severe): Secondary | ICD-10-CM

## 2016-04-26 NOTE — Progress Notes (Signed)
Patient name: [name] MRN: [id number] DOB: 03/22/1930 Sex: male  REASON FOR VISIT: follow-up access  HPI: [name] is a 82 y.o. male who presents for follow-up status post right brachial cephalic AV fistula placed on 40/98/119103/29/2017. The patient is not yet on dialysis. He is followed by Dr. Darrick Pennaeterding. He denies any right hand pain or numbness. The patient does report some leg swelling. He has some shortness of breath at rest. This is unchanged.  Current Outpatient Prescriptions  Medication Sig Dispense Refill  . ACCU-CHEK FASTCLIX LANCETS MISC Use to check blood sugar 2 times per day dx code E11.9 102 each 2  . allopurinol (ZYLOPRIM) 100 MG tablet Take 100 mg by mouth at bedtime.  6  . allopurinol (ZYLOPRIM) 300 MG tablet Take 300 mg by mouth every morning.   3  . amLODipine (NORVASC) 10 MG tablet Take 0.5 tablets (5 mg total) by mouth daily. (Patient taking differently: Take 5 mg by mouth every morning. )    . aspirin EC 81 MG tablet Take 81 mg by mouth every evening.    Marland Kitchen. atorvastatin (LIPITOR) 40 MG tablet Take 40 mg by mouth every morning.   5  . calcitRIOL (ROCALTROL) 0.25 MCG capsule Take 2 capsules (0.5 mcg total) by mouth daily. 60 capsule 0  . camphor-menthol (SARNA) lotion Apply topically as needed for itching. 222 mL 0  . clopidogrel (PLAVIX) 75 MG tablet Take 1 tablet (75 mg total) by mouth daily. Please schedule appointment for refills. 30 tablet 0  . feeding supplement (BOOST / RESOURCE BREEZE) LIQD Take 1 Container by mouth 3 (three) times daily between meals.    . ferrous sulfate 325 (65 FE) MG tablet Take 325 mg by mouth 2 (two) times daily with a meal.    . furosemide (LASIX) 80 MG tablet Take 240 mg by mouth 2 (two) times daily.   6  . glucose blood (ACCU-CHEK AVIVA PLUS) test strip Use as instructed to check blood sugar 2 times per day dx code E11.9 100 each 3  . isosorbide mononitrate (IMDUR) 60 MG 24 hr tablet Take 1 tablet (60 mg total) by mouth daily. 30 tablet 1  .  JANUVIA 25 MG tablet TAKE 1 TABLET BY MOUTH DAILY. 30 tablet 0  . lanthanum (FOSRENOL) 1000 MG chewable tablet Chew 1 tablet (1,000 mg total) by mouth 3 (three) times daily with meals. (Patient taking differently: Chew 1,000 mg by mouth 2 (two) times daily with a meal. ) 60 tablet 0  . metolazone (ZAROXOLYN) 10 MG tablet Take 10 mg by mouth every morning.    . metoprolol succinate (TOPROL-XL) 50 MG 24 hr tablet Take 1 tablet (50 mg total) by mouth daily. Take with or immediately following a meal. 30 tablet 1  . Multiple Vitamins-Minerals (PRESERVISION AREDS PO) Take 1 tablet by mouth 2 (two) times daily.     . pantoprazole (PROTONIX) 40 MG tablet Take 40 mg by mouth at bedtime.  5  . polyethylene glycol (MIRALAX / GLYCOLAX) packet Take 17 g by mouth daily as needed. constipation  0  . Potassium Chloride ER 20 MEQ TBCR Take 20 mEq by mouth 2 (two) times daily.   6   No current facility-administered medications for this visit.    REVIEW OF SYSTEMS:  [X]  denotes positive finding, [ ]  denotes negative finding Cardiac  Comments:  Chest pain or chest pressure:    Shortness of breath upon exertion:    Short of breath when  lying flat:    Irregular heart rhythm:    Constitutional    Fever or chills:      PHYSICAL EXAM: Filed Vitals:   04/26/16 1427  BP: 182/52  Pulse: 60  Temp: 97.7 F (36.5 C)  TempSrc: Oral  Resp: 20  Height: 5\' 6"  (1.676 m)  Weight: 150 lb (68.04 kg)  SpO2: 98%    GENERAL: The patient is a well-nourished male, in no acute distress. The vital signs are documented above. CARDIOVASCULAR: There is a regular rate and rhythm. PULMONARY: Lungs clear bilaterally. VASCULAR: Easily palpable thrill right upper arm fistula with audible bruit. 2+ right radial pulse. Sensation intact right hand.  DATA: Dialysis fistula duplex 04/26/2016  Right upper arm fistula is 0.75 cm at the antecubital fossa and 0.66 cm at the mid upper arm. Depth is less then 0.34 centimeters  throughout.  MEDICAL ISSUES: CKD stage V  The patient's fistula has matured and is ready for use. Advised the patient to continue light weight lifting of the right arm. He will follow up on an as-needed basis.  [name], PA-C [facility] [place]   History and exam findings as above. Fistula is developing well. It should be ready for use in the near future if needed. He'll follow-up with us on as-needed basis.  [name], MD [facility] [place] Office: [phone] Pager: [phone]

## 2016-05-10 ENCOUNTER — Encounter (HOSPITAL_COMMUNITY)
Admission: RE | Admit: 2016-05-10 | Discharge: 2016-05-10 | Disposition: A | Discharge status: Home / Self Care | Source: Ambulatory Visit | Attending: Nephrology | Admitting: Nephrology

## 2016-05-10 DIAGNOSIS — D638 Anemia in other chronic diseases classified elsewhere: Secondary | ICD-10-CM

## 2016-05-10 DIAGNOSIS — D631 Anemia in chronic kidney disease: Secondary | ICD-10-CM | POA: Insufficient documentation

## 2016-05-10 DIAGNOSIS — N183 Chronic kidney disease, stage 3 (moderate): Secondary | ICD-10-CM | POA: Diagnosis not present

## 2016-05-10 LAB — FERRITIN: FERRITIN: 136 ng/mL (ref 24–336)

## 2016-05-10 LAB — IRON AND TIBC
IRON: 37 ug/dL — AB (ref 45–182)
Saturation Ratios: 22 % (ref 17.9–39.5)
TIBC: 171 ug/dL — ABNORMAL LOW (ref 250–450)
UIBC: 134 ug/dL

## 2016-05-10 LAB — POCT HEMOGLOBIN-HEMACUE: HEMOGLOBIN: 7.1 g/dL — AB (ref 13.0–17.0)

## 2016-05-10 MED ORDER — EPOETIN ALFA 40000 UNIT/ML IJ SOLN
40000.0000 [IU] | INTRAMUSCULAR | Status: DC
  Administered 2016-05-10: 40000 [IU] via SUBCUTANEOUS

## 2016-05-10 MED ORDER — EPOETIN ALFA 40000 UNIT/ML IJ SOLN
INTRAMUSCULAR | Status: AC
  Administered 2016-05-10: 40000 [IU] via SUBCUTANEOUS
  Filled 2016-05-10: qty 1

## 2016-05-10 NOTE — Progress Notes (Signed)
Patient has not been here for injection since 02/23/16. Daughter states she forgot about these shots. Called [facility] for confirmation on whether to continue with current orders.Spoke with Crystal. To continue on with current orders.

## 2016-05-14 ENCOUNTER — Other Ambulatory Visit: Payer: Self-pay | Admitting: Endocrinology

## 2016-05-16 ENCOUNTER — Other Ambulatory Visit (HOSPITAL_COMMUNITY): Payer: Self-pay | Admitting: *Deleted

## 2016-05-17 ENCOUNTER — Ambulatory Visit (HOSPITAL_COMMUNITY): Admission: RE | Admit: 2016-05-17 | Source: Ambulatory Visit

## 2016-05-29 DEATH — deceased

## 2016-08-06 ENCOUNTER — Ambulatory Visit (INDEPENDENT_AMBULATORY_CARE_PROVIDER_SITE_OTHER): Payer: Medicare Other | Admitting: Ophthalmology

## 2016-12-08 IMAGING — CT CT HEAD W/O CM
2 series · 15 of 30 positions shown, 17 images · non-contrast
Comparison: None.

CLINICAL DATA: Acute presentation with confusion

EXAM:
CT HEAD WITHOUT CONTRAST
TECHNIQUE: Contiguous axial images were obtained from the base of the skull
through the vertex without intravenous contrast.

[Series 2: head without · axial · non-contrast · 0.44mm/px · z∈[-96,+34]mm · 7 of 36 slices shown, 9 images]
[im 5/36  brain]
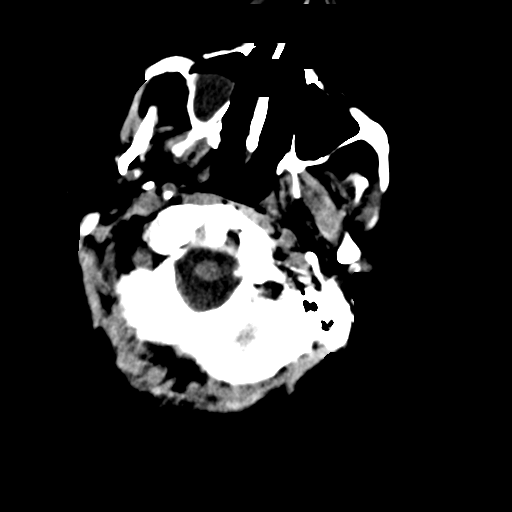
[im 5/36  bone]
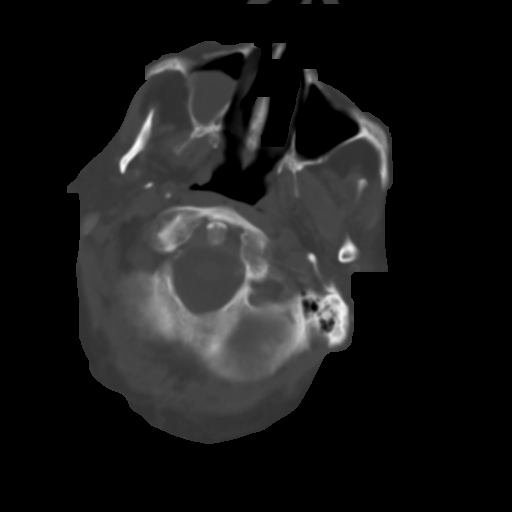
[im 9/36  brain]
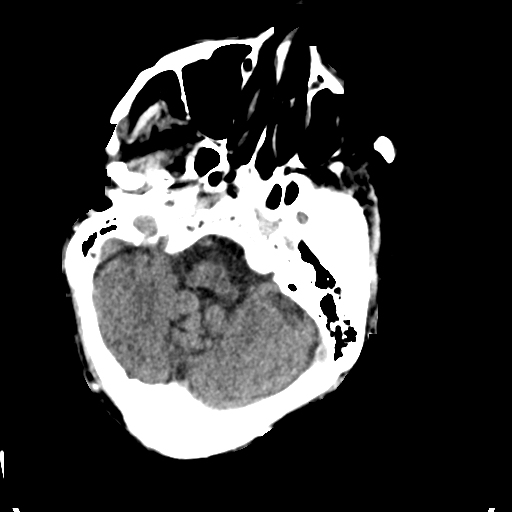
[im 14/36  brain]
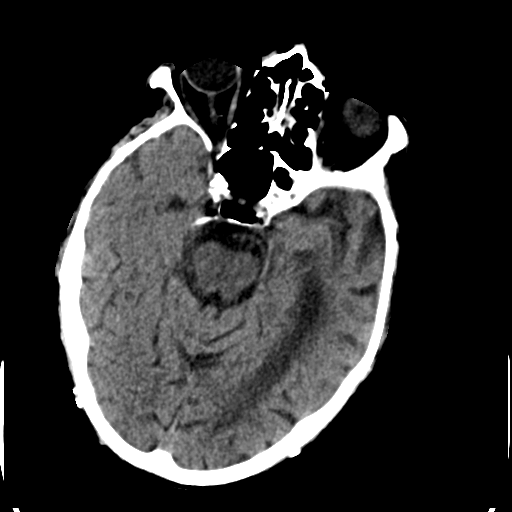
[im 18/36  brain]
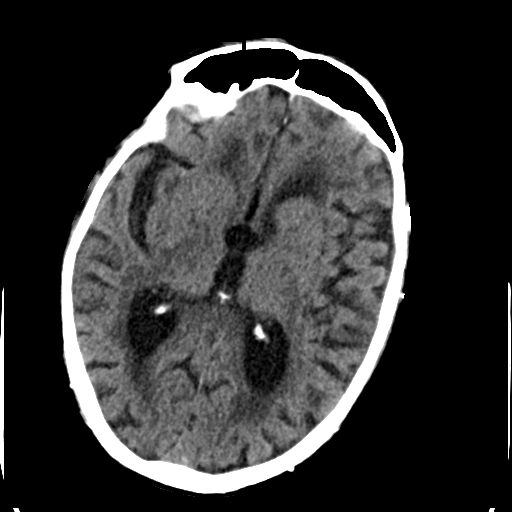
[im 22/36  brain]
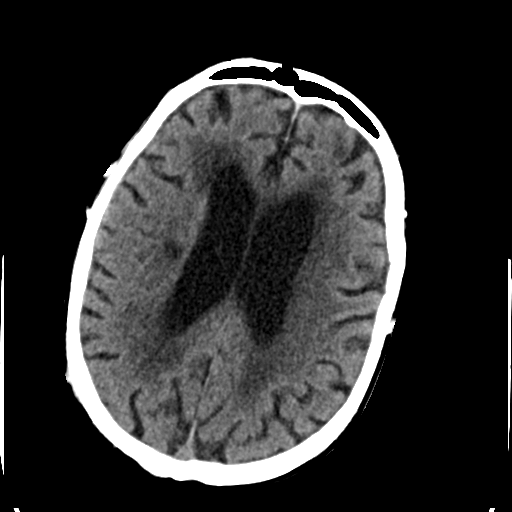
[im 22/36  bone]
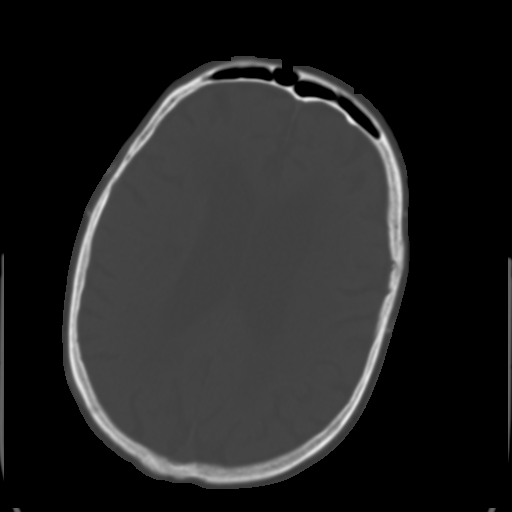
[im 27/36  brain]
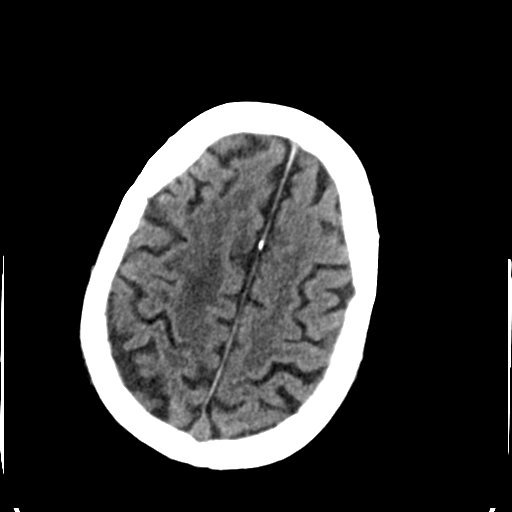
[im 31/36  brain]
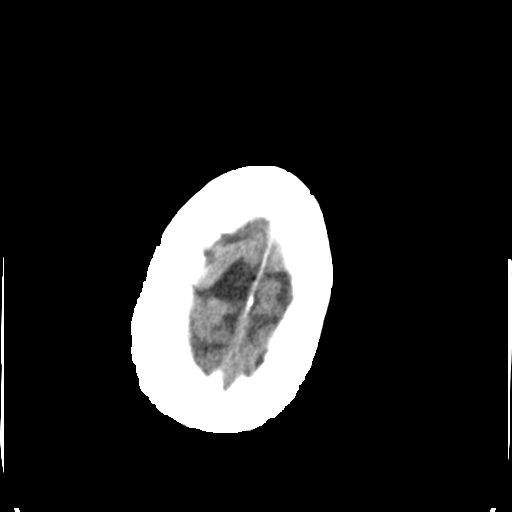

[Series 3: head bone · axial · 0.44mm/px · z∈[-100,+40]mm · 8 of 88 slices shown]
[im 9/88  bone]
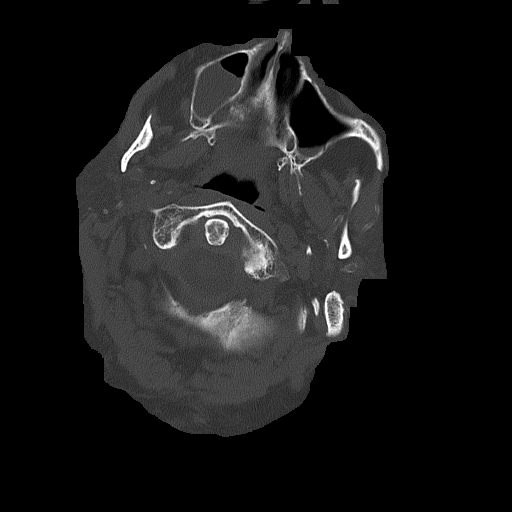
[im 18/88  bone]
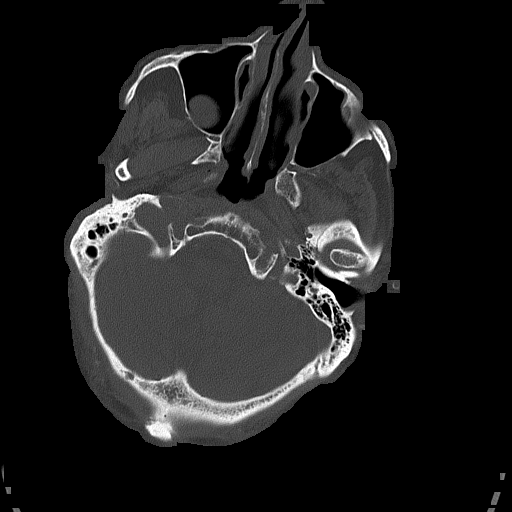
[im 27/88  bone]
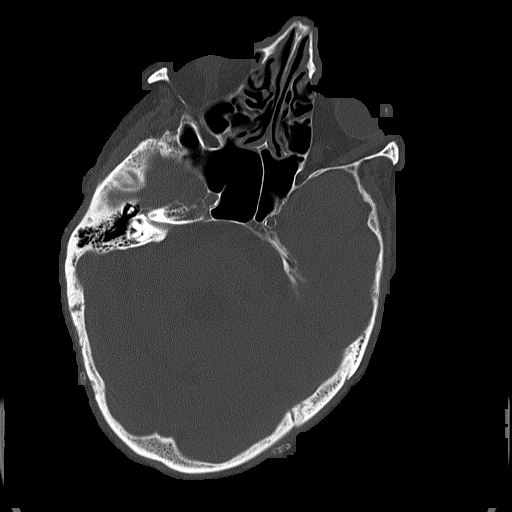
[im 40/88  bone]
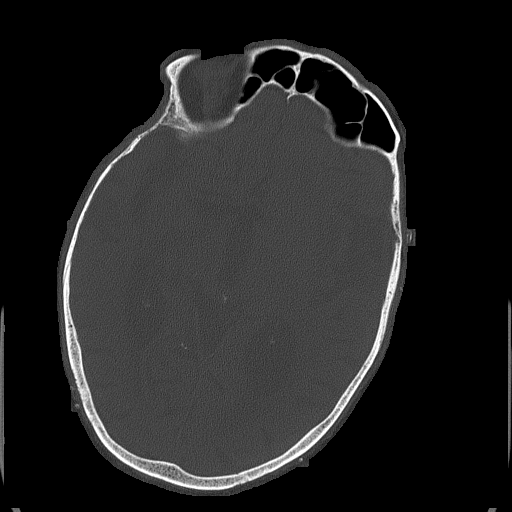
[im 48/88  bone]
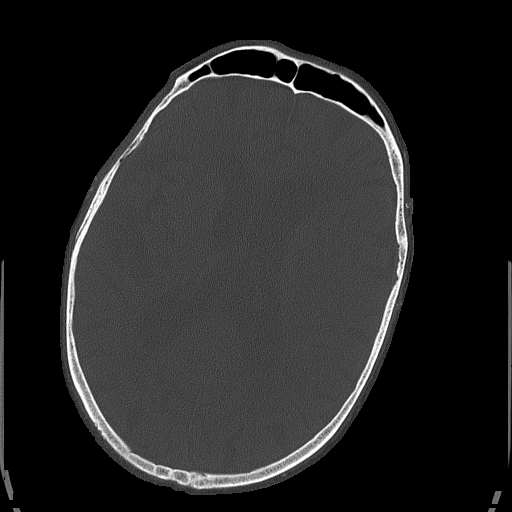
[im 61/88  bone]
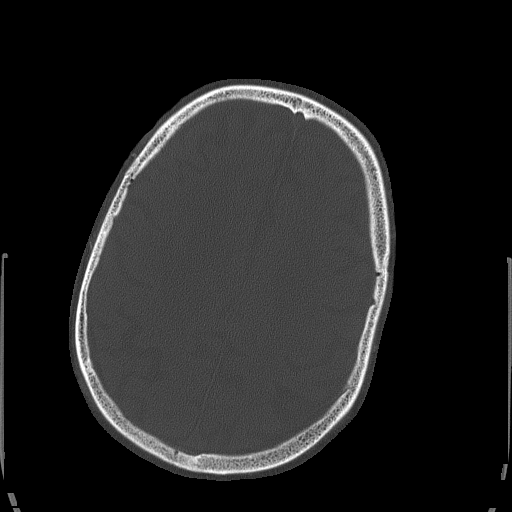
[im 70/88  bone]
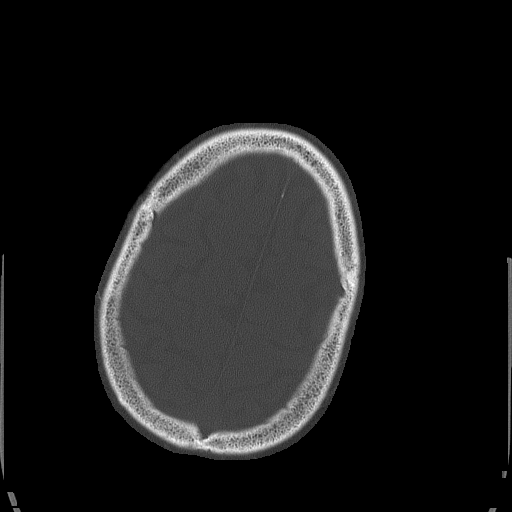
[im 79/88  bone]
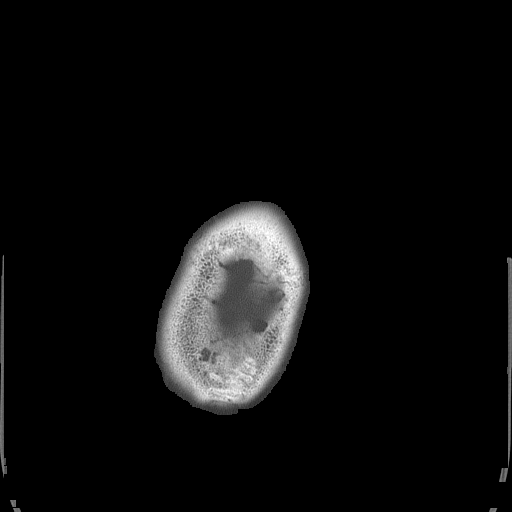

[15 of 30 positions shown; findings below may reference images not displayed]

FINDINGS: The brain shows generalized atrophy. There is advanced chronic
appearing small vessel ischemic change throughout the cerebral
hemispheric white matter an the pons. No identifiable acute
infarction, though an acute insult could be hidden amongst the
extensive chronic changes. No large vessel territory infarction. No
mass lesion, hemorrhage, hydrocephalus or extra-axial collection. No
calvarial abnormality. Sinuses are clear except for a retention cyst
in the right maxillary sinus. There is atherosclerotic calcification
of the major vessels at the base of the brain.
IMPRESSION: No acute finding by CT. Advanced atrophy and chronic small vessel
ischemic changes throughout the brain. Certainly, a small acute
insult could be hidden amongst the extensive chronic changes.

## 2016-12-08 IMAGING — CR DG CHEST 2V
2 series · 2 of 2 positions shown · non-contrast
Comparison: 11/23/2015

CLINICAL DATA: End-stage renal disease. Diabetes. COPD.
Hypertension. Dementia. Lethargy.

EXAM:
CHEST  2 VIEW

[chest lat]
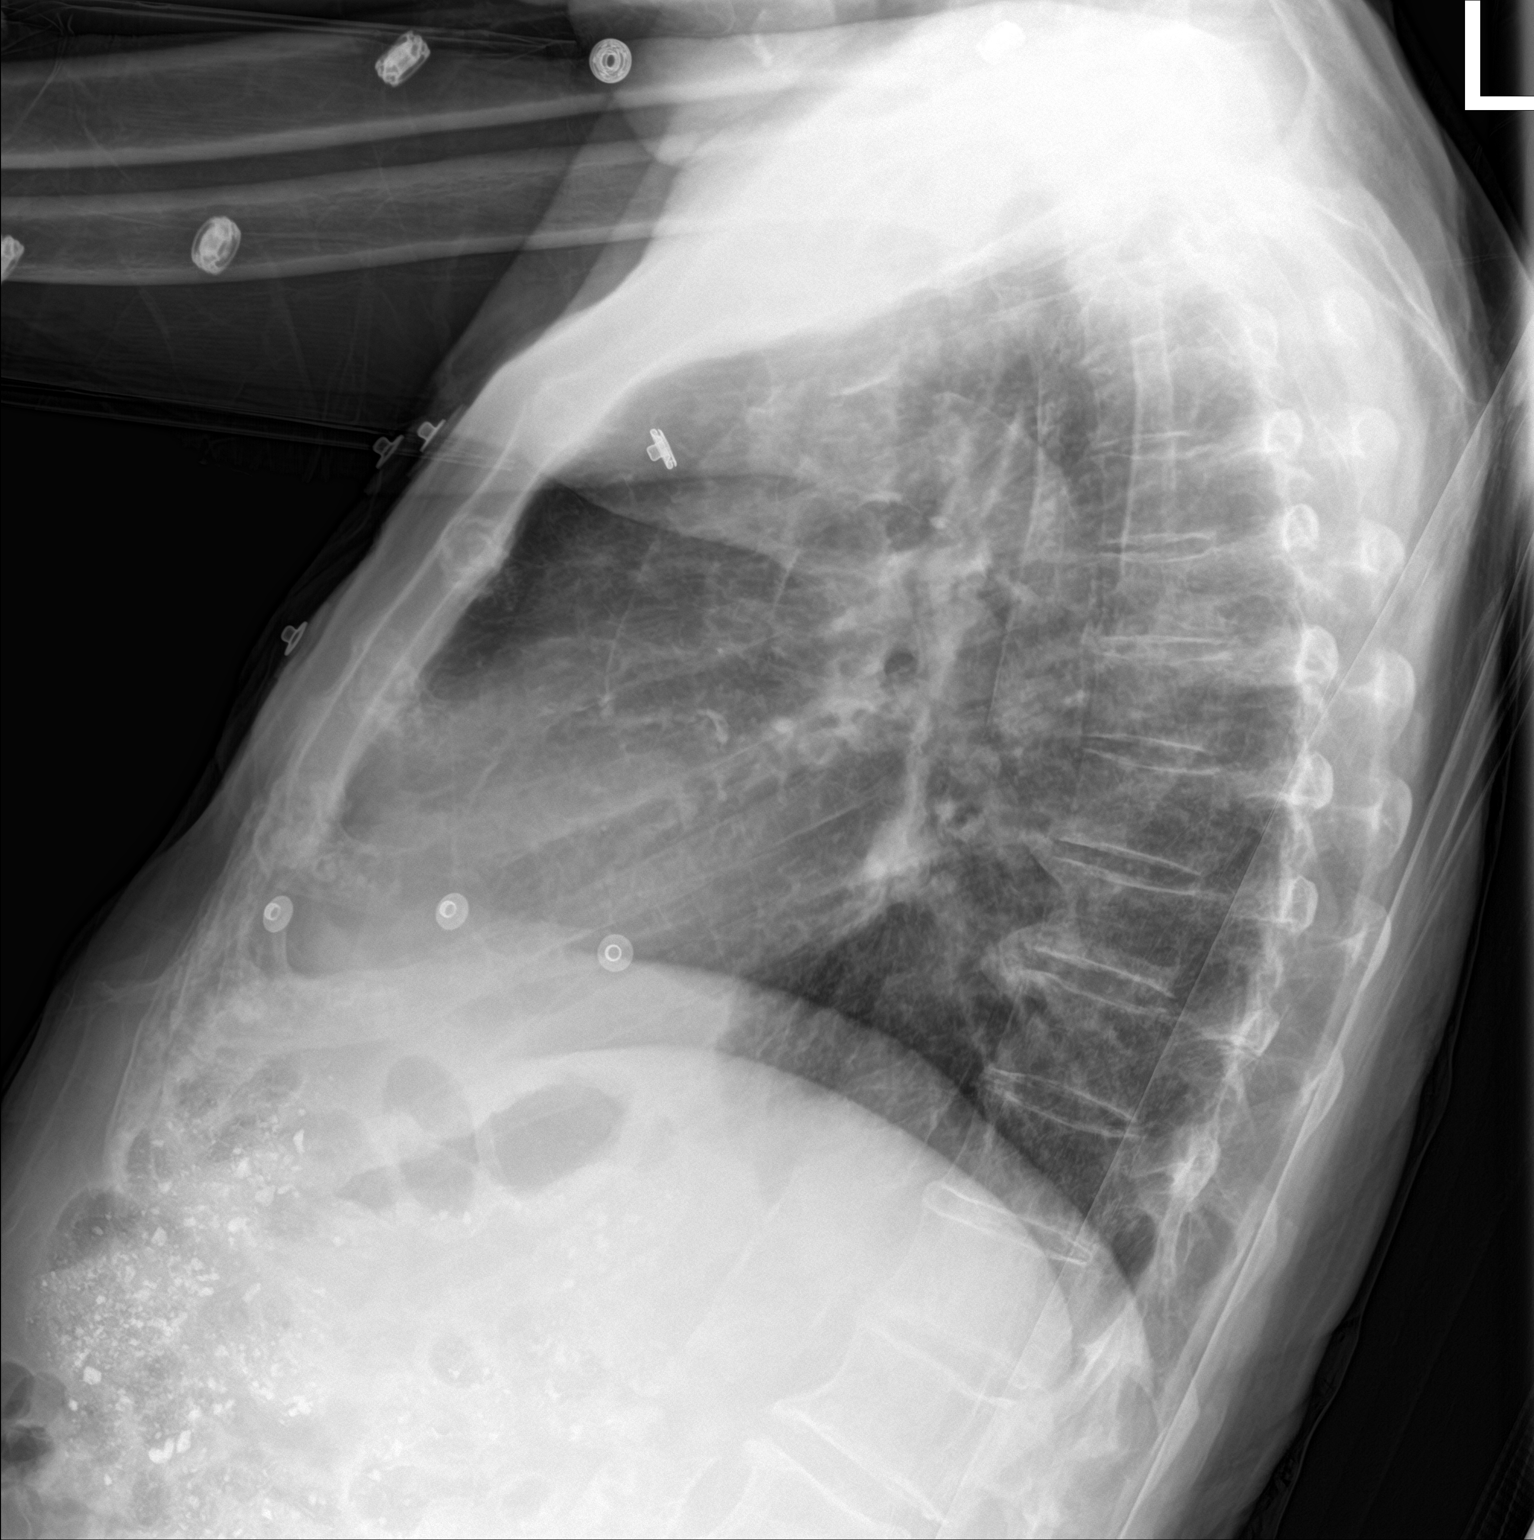

[chest ap]
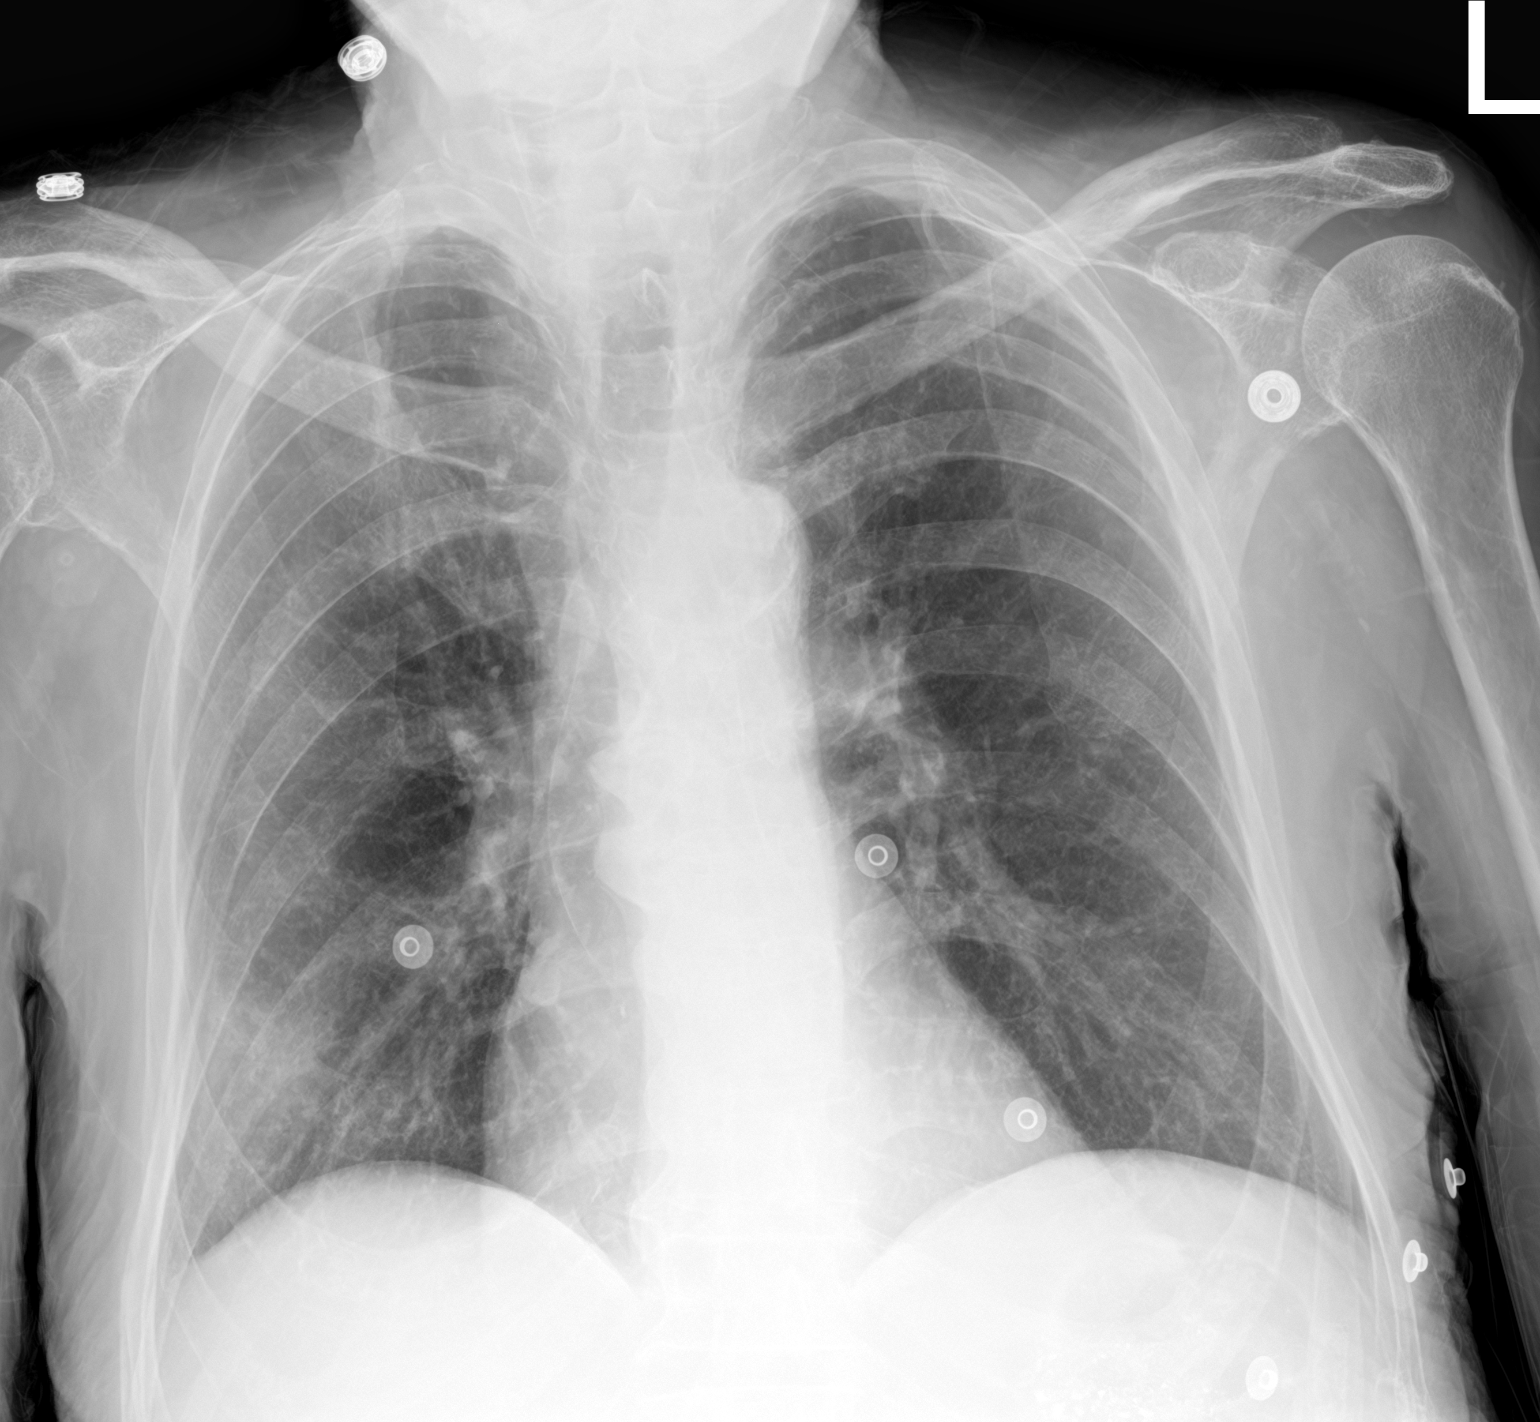

[2 of 2 positions shown; findings below may reference images not displayed]

FINDINGS: Thoracic spondylosis.  Aortic atherosclerotic calcification.

The patient is rotated to the right on today's radiograph, reducing
diagnostic sensitivity and specificity.

Hazy density at the right lung base, indistinctly marginated.

The generalized pulmonary vascular indistinctness shown on the prior
exam has improved. No blunting of the costophrenic angles.
IMPRESSION: 1. Vague density at the right lung base, indistinctly marginated,
possibly from residual edema but technically nonspecific. I
recommend followup chest radiography in 4 weeks time to ensure
clearance of this process. If the process has not cleared, CT would
be recommended for further workup.
2. Generally the interstitial opacity shown on the prior exam has
improved.
3. Atherosclerosis.
4. Thoracic spondylosis.
